# Patient Record
Sex: Female | Born: 1960 | Race: White | Hispanic: No | Smoking: Current every day smoker
Health system: Southern US, Community
[De-identification: ages and names within clinical notes are randomized; demographics above are authoritative.]

## PROBLEM LIST (undated history)

## (undated) DIAGNOSIS — M1990 Unspecified osteoarthritis, unspecified site: Secondary | ICD-10-CM

## (undated) DIAGNOSIS — G709 Myoneural disorder, unspecified: Secondary | ICD-10-CM

## (undated) DIAGNOSIS — E039 Hypothyroidism, unspecified: Secondary | ICD-10-CM

## (undated) DIAGNOSIS — Z9689 Presence of other specified functional implants: Secondary | ICD-10-CM

## (undated) DIAGNOSIS — K7460 Unspecified cirrhosis of liver: Secondary | ICD-10-CM

## (undated) DIAGNOSIS — F419 Anxiety disorder, unspecified: Secondary | ICD-10-CM

## (undated) DIAGNOSIS — K5900 Constipation, unspecified: Secondary | ICD-10-CM

## (undated) DIAGNOSIS — F1010 Alcohol abuse, uncomplicated: Secondary | ICD-10-CM

## (undated) DIAGNOSIS — Z720 Tobacco use: Secondary | ICD-10-CM

## (undated) DIAGNOSIS — E119 Type 2 diabetes mellitus without complications: Secondary | ICD-10-CM

## (undated) DIAGNOSIS — E785 Hyperlipidemia, unspecified: Secondary | ICD-10-CM

## (undated) DIAGNOSIS — T8859XA Other complications of anesthesia, initial encounter: Secondary | ICD-10-CM

## (undated) DIAGNOSIS — M4800 Spinal stenosis, site unspecified: Secondary | ICD-10-CM

## (undated) DIAGNOSIS — H8110 Benign paroxysmal vertigo, unspecified ear: Secondary | ICD-10-CM

## (undated) DIAGNOSIS — J449 Chronic obstructive pulmonary disease, unspecified: Secondary | ICD-10-CM

## (undated) DIAGNOSIS — E538 Deficiency of other specified B group vitamins: Secondary | ICD-10-CM

## (undated) DIAGNOSIS — K219 Gastro-esophageal reflux disease without esophagitis: Secondary | ICD-10-CM

## (undated) DIAGNOSIS — T7840XA Allergy, unspecified, initial encounter: Secondary | ICD-10-CM

## (undated) DIAGNOSIS — D649 Anemia, unspecified: Secondary | ICD-10-CM

## (undated) DIAGNOSIS — M21379 Foot drop, unspecified foot: Secondary | ICD-10-CM

## (undated) DIAGNOSIS — T4145XA Adverse effect of unspecified anesthetic, initial encounter: Secondary | ICD-10-CM

## (undated) DIAGNOSIS — Z950 Presence of cardiac pacemaker: Secondary | ICD-10-CM

## (undated) DIAGNOSIS — I509 Heart failure, unspecified: Secondary | ICD-10-CM

## (undated) HISTORY — PX: HERNIA REPAIR: SHX51

## (undated) HISTORY — DX: Anxiety disorder, unspecified: F41.9

## (undated) HISTORY — DX: Type 2 diabetes mellitus without complications: E11.9

## (undated) HISTORY — PX: ANKLE SURGERY: SHX546

## (undated) HISTORY — DX: Myoneural disorder, unspecified: G70.9

## (undated) HISTORY — PX: KNEE ARTHROSCOPY: SUR90

## (undated) HISTORY — DX: Constipation, unspecified: K59.00

## (undated) HISTORY — PX: COLONOSCOPY: SHX174

## (undated) HISTORY — DX: Hyperlipidemia, unspecified: E78.5

## (undated) HISTORY — DX: Unspecified osteoarthritis, unspecified site: M19.90

## (undated) HISTORY — PX: OTHER SURGICAL HISTORY: SHX169

## (undated) HISTORY — DX: Allergy, unspecified, initial encounter: T78.40XA

## (undated) HISTORY — PX: CERVICAL CONE BIOPSY: SUR198

## (undated) HISTORY — DX: Heart failure, unspecified: I50.9

## (undated) HISTORY — PX: KNEE SURGERY: SHX244

## (undated) HISTORY — PX: POLYPECTOMY: SHX149

## (undated) HISTORY — DX: Anemia, unspecified: D64.9

## (undated) HISTORY — PX: UMBILICAL HERNIA REPAIR: SHX196

## (undated) HISTORY — PX: KNEE ARTHROSCOPY: SHX127

---

## 2005-01-07 ENCOUNTER — Ambulatory Visit: Payer: Self-pay | Admitting: Internal Medicine

## 2005-01-14 ENCOUNTER — Ambulatory Visit: Payer: Self-pay | Admitting: Internal Medicine

## 2005-01-14 ENCOUNTER — Other Ambulatory Visit: Admission: RE | Admit: 2005-01-14 | Discharge: 2005-01-14 | Payer: Self-pay | Admitting: Internal Medicine

## 2005-01-15 ENCOUNTER — Ambulatory Visit: Payer: Self-pay | Admitting: Internal Medicine

## 2005-06-24 ENCOUNTER — Emergency Department (HOSPITAL_COMMUNITY): Admission: EM | Admit: 2005-06-24 | Discharge: 2005-06-24 | Payer: Self-pay | Admitting: Family Medicine

## 2005-06-28 ENCOUNTER — Emergency Department (HOSPITAL_COMMUNITY): Admission: EM | Admit: 2005-06-28 | Discharge: 2005-06-28 | Payer: Self-pay | Admitting: Emergency Medicine

## 2006-07-25 ENCOUNTER — Emergency Department (HOSPITAL_COMMUNITY): Admission: EM | Admit: 2006-07-25 | Discharge: 2006-07-25 | Payer: Self-pay | Admitting: Emergency Medicine

## 2006-08-15 ENCOUNTER — Encounter: Admission: RE | Admit: 2006-08-15 | Discharge: 2006-08-15 | Payer: Self-pay | Admitting: Orthopedic Surgery

## 2006-08-20 ENCOUNTER — Other Ambulatory Visit: Admission: RE | Admit: 2006-08-20 | Discharge: 2006-08-20 | Payer: Self-pay | Admitting: Obstetrics & Gynecology

## 2006-09-04 ENCOUNTER — Encounter: Admission: RE | Admit: 2006-09-04 | Discharge: 2006-09-04 | Payer: Self-pay | Admitting: Gastroenterology

## 2006-09-10 ENCOUNTER — Ambulatory Visit: Payer: Self-pay | Admitting: Physical Medicine and Rehabilitation

## 2006-09-10 ENCOUNTER — Encounter
Admission: RE | Admit: 2006-09-10 | Discharge: 2006-12-09 | Payer: Self-pay | Admitting: Physical Medicine and Rehabilitation

## 2006-09-18 ENCOUNTER — Encounter: Admission: RE | Admit: 2006-09-18 | Discharge: 2006-09-18 | Payer: Self-pay | Admitting: Obstetrics & Gynecology

## 2006-10-14 ENCOUNTER — Other Ambulatory Visit: Admission: RE | Admit: 2006-10-14 | Discharge: 2006-10-14 | Payer: Self-pay | Admitting: Obstetrics & Gynecology

## 2006-11-03 ENCOUNTER — Encounter
Admission: RE | Admit: 2006-11-03 | Discharge: 2007-02-01 | Payer: Self-pay | Admitting: Physical Medicine and Rehabilitation

## 2006-11-03 ENCOUNTER — Ambulatory Visit: Payer: Self-pay | Admitting: Physical Medicine and Rehabilitation

## 2006-11-09 ENCOUNTER — Ambulatory Visit (HOSPITAL_BASED_OUTPATIENT_CLINIC_OR_DEPARTMENT_OTHER): Admission: RE | Admit: 2006-11-09 | Discharge: 2006-11-09 | Payer: Self-pay | Admitting: Obstetrics & Gynecology

## 2006-11-09 ENCOUNTER — Encounter (INDEPENDENT_AMBULATORY_CARE_PROVIDER_SITE_OTHER): Payer: Self-pay | Admitting: *Deleted

## 2007-01-12 ENCOUNTER — Ambulatory Visit (HOSPITAL_BASED_OUTPATIENT_CLINIC_OR_DEPARTMENT_OTHER): Admission: RE | Admit: 2007-01-12 | Discharge: 2007-01-12 | Payer: Self-pay | Admitting: Orthopedic Surgery

## 2007-05-31 DIAGNOSIS — M1990 Unspecified osteoarthritis, unspecified site: Secondary | ICD-10-CM | POA: Insufficient documentation

## 2007-05-31 DIAGNOSIS — J309 Allergic rhinitis, unspecified: Secondary | ICD-10-CM | POA: Insufficient documentation

## 2007-05-31 DIAGNOSIS — K219 Gastro-esophageal reflux disease without esophagitis: Secondary | ICD-10-CM | POA: Insufficient documentation

## 2008-01-26 ENCOUNTER — Other Ambulatory Visit: Admission: RE | Admit: 2008-01-26 | Discharge: 2008-01-26 | Payer: Self-pay | Admitting: Obstetrics & Gynecology

## 2008-01-27 ENCOUNTER — Encounter: Admission: RE | Admit: 2008-01-27 | Discharge: 2008-01-27 | Payer: Self-pay | Admitting: Obstetrics & Gynecology

## 2008-02-01 ENCOUNTER — Encounter: Admission: RE | Admit: 2008-02-01 | Discharge: 2008-02-01 | Payer: Self-pay | Admitting: Obstetrics & Gynecology

## 2008-02-01 ENCOUNTER — Encounter (INDEPENDENT_AMBULATORY_CARE_PROVIDER_SITE_OTHER): Payer: Self-pay | Admitting: *Deleted

## 2008-02-10 ENCOUNTER — Telehealth (INDEPENDENT_AMBULATORY_CARE_PROVIDER_SITE_OTHER): Payer: Self-pay | Admitting: *Deleted

## 2008-03-14 DIAGNOSIS — K644 Residual hemorrhoidal skin tags: Secondary | ICD-10-CM | POA: Insufficient documentation

## 2008-03-14 DIAGNOSIS — A389 Scarlet fever, uncomplicated: Secondary | ICD-10-CM | POA: Insufficient documentation

## 2008-03-14 DIAGNOSIS — N946 Dysmenorrhea, unspecified: Secondary | ICD-10-CM | POA: Insufficient documentation

## 2008-03-14 DIAGNOSIS — Z8639 Personal history of other endocrine, nutritional and metabolic disease: Secondary | ICD-10-CM

## 2008-03-14 DIAGNOSIS — K429 Umbilical hernia without obstruction or gangrene: Secondary | ICD-10-CM | POA: Insufficient documentation

## 2008-03-14 DIAGNOSIS — E785 Hyperlipidemia, unspecified: Secondary | ICD-10-CM | POA: Insufficient documentation

## 2008-03-14 DIAGNOSIS — G4700 Insomnia, unspecified: Secondary | ICD-10-CM | POA: Insufficient documentation

## 2008-03-14 DIAGNOSIS — R161 Splenomegaly, not elsewhere classified: Secondary | ICD-10-CM | POA: Insufficient documentation

## 2008-03-14 DIAGNOSIS — R160 Hepatomegaly, not elsewhere classified: Secondary | ICD-10-CM | POA: Insufficient documentation

## 2008-03-14 DIAGNOSIS — N809 Endometriosis, unspecified: Secondary | ICD-10-CM | POA: Insufficient documentation

## 2008-03-14 DIAGNOSIS — Z862 Personal history of diseases of the blood and blood-forming organs and certain disorders involving the immune mechanism: Secondary | ICD-10-CM | POA: Insufficient documentation

## 2008-03-14 DIAGNOSIS — Z872 Personal history of diseases of the skin and subcutaneous tissue: Secondary | ICD-10-CM | POA: Insufficient documentation

## 2008-03-16 ENCOUNTER — Ambulatory Visit: Payer: Self-pay | Admitting: Internal Medicine

## 2008-03-16 DIAGNOSIS — F1010 Alcohol abuse, uncomplicated: Secondary | ICD-10-CM | POA: Insufficient documentation

## 2008-03-16 DIAGNOSIS — R740 Nonspecific elevation of levels of transaminase and lactic acid dehydrogenase [LDH]: Secondary | ICD-10-CM

## 2008-03-16 DIAGNOSIS — R7401 Elevation of levels of liver transaminase levels: Secondary | ICD-10-CM | POA: Insufficient documentation

## 2008-03-16 DIAGNOSIS — R7402 Elevation of levels of lactic acid dehydrogenase (LDH): Secondary | ICD-10-CM | POA: Insufficient documentation

## 2008-03-20 ENCOUNTER — Telehealth: Payer: Self-pay | Admitting: Internal Medicine

## 2008-03-28 ENCOUNTER — Ambulatory Visit: Payer: Self-pay | Admitting: Internal Medicine

## 2008-04-04 ENCOUNTER — Ambulatory Visit: Payer: Self-pay | Admitting: Internal Medicine

## 2008-04-04 DIAGNOSIS — I8500 Esophageal varices without bleeding: Secondary | ICD-10-CM | POA: Insufficient documentation

## 2008-04-05 LAB — CONVERTED CEMR LAB
ALT: 13 U/L
AST: 60 U/L — ABNORMAL HIGH
Albumin: 2.7 g/dL — ABNORMAL LOW
Alkaline Phosphatase: 179 U/L — ABNORMAL HIGH
Bilirubin, Direct: 0.4 mg/dL — ABNORMAL HIGH
Cholesterol: 169 mg/dL
HDL: 16.1 mg/dL — ABNORMAL LOW
INR: 1.3 — ABNORMAL HIGH
LDL Cholesterol: 128 mg/dL — ABNORMAL HIGH
Prothrombin Time: 15.5 s — ABNORMAL HIGH
Total Bilirubin: 1.4 mg/dL — ABNORMAL HIGH
Total CHOL/HDL Ratio: 10.5
Total Protein: 7 g/dL
Triglycerides: 126 mg/dL
VLDL: 25 mg/dL

## 2008-05-29 ENCOUNTER — Ambulatory Visit: Payer: Self-pay | Admitting: Internal Medicine

## 2008-05-29 LAB — CONVERTED CEMR LAB
Albumin: 3.1 g/dL — ABNORMAL LOW (ref 3.5–5.2)
Alkaline Phosphatase: 168 units/L — ABNORMAL HIGH (ref 39–117)
INR: 1.3 — ABNORMAL HIGH (ref 0.8–1.0)
Prothrombin Time: 14.8 s — ABNORMAL HIGH (ref 10.9–13.3)
aPTT: 43.2 s — ABNORMAL HIGH (ref 21.7–29.8)

## 2008-05-31 ENCOUNTER — Telehealth: Payer: Self-pay | Admitting: Internal Medicine

## 2008-06-14 ENCOUNTER — Telehealth: Payer: Self-pay | Admitting: Internal Medicine

## 2008-09-20 ENCOUNTER — Encounter: Payer: Self-pay | Admitting: Internal Medicine

## 2008-09-25 ENCOUNTER — Telehealth: Payer: Self-pay | Admitting: Internal Medicine

## 2008-09-28 ENCOUNTER — Encounter (INDEPENDENT_AMBULATORY_CARE_PROVIDER_SITE_OTHER): Payer: Self-pay | Admitting: *Deleted

## 2008-09-28 ENCOUNTER — Encounter: Admission: RE | Admit: 2008-09-28 | Discharge: 2008-09-28 | Payer: Self-pay | Admitting: Family Medicine

## 2008-10-02 ENCOUNTER — Ambulatory Visit: Payer: Self-pay | Admitting: Internal Medicine

## 2008-10-02 DIAGNOSIS — K7030 Alcoholic cirrhosis of liver without ascites: Secondary | ICD-10-CM | POA: Insufficient documentation

## 2008-10-02 DIAGNOSIS — R1084 Generalized abdominal pain: Secondary | ICD-10-CM | POA: Insufficient documentation

## 2008-10-02 DIAGNOSIS — R17 Unspecified jaundice: Secondary | ICD-10-CM | POA: Insufficient documentation

## 2008-10-02 DIAGNOSIS — R188 Other ascites: Secondary | ICD-10-CM | POA: Insufficient documentation

## 2008-10-02 LAB — CONVERTED CEMR LAB
Ammonia: 31 umol/L (ref 11–35)
Eosinophils Relative: 0 % (ref 0.0–5.0)
HCT: 29.9 % — ABNORMAL LOW (ref 36.0–46.0)
INR: 1.5 — ABNORMAL HIGH (ref 0.8–1.0)
Monocytes Relative: 6 % (ref 3.0–12.0)
Neutrophils Relative %: 82 % — ABNORMAL HIGH (ref 43.0–77.0)
Platelets: 242 10*3/uL (ref 150–400)
WBC: 11.7 10*3/uL — ABNORMAL HIGH (ref 4.5–10.5)

## 2008-10-03 ENCOUNTER — Ambulatory Visit: Payer: Self-pay | Admitting: Internal Medicine

## 2008-10-04 ENCOUNTER — Observation Stay (HOSPITAL_COMMUNITY): Admission: AD | Admit: 2008-10-04 | Discharge: 2008-10-05 | Payer: Self-pay | Admitting: Internal Medicine

## 2008-10-04 ENCOUNTER — Telehealth: Payer: Self-pay | Admitting: Internal Medicine

## 2008-10-04 ENCOUNTER — Encounter (INDEPENDENT_AMBULATORY_CARE_PROVIDER_SITE_OTHER): Payer: Self-pay | Admitting: Interventional Radiology

## 2008-10-04 LAB — CONVERTED CEMR LAB
ALT: 16 units/L (ref 0–35)
CO2: 28 meq/L (ref 19–32)
Calcium: 8.6 mg/dL (ref 8.4–10.5)
Chloride: 86 meq/L — ABNORMAL LOW (ref 96–112)
GFR calc Af Amer: 171 mL/min
Sodium: 122 meq/L — ABNORMAL LOW (ref 135–145)
Total Protein: 7.3 g/dL (ref 6.0–8.3)

## 2008-10-05 ENCOUNTER — Encounter (INDEPENDENT_AMBULATORY_CARE_PROVIDER_SITE_OTHER): Payer: Self-pay | Admitting: *Deleted

## 2008-10-05 ENCOUNTER — Ambulatory Visit: Payer: Self-pay | Admitting: Gastroenterology

## 2008-10-09 ENCOUNTER — Ambulatory Visit: Payer: Self-pay | Admitting: Internal Medicine

## 2008-10-09 LAB — CONVERTED CEMR LAB
AST: 71 units/L — ABNORMAL HIGH (ref 0–37)
Alkaline Phosphatase: 114 units/L (ref 39–117)
Ammonia: 27 umol/L (ref 11–35)
BUN: 7 mg/dL (ref 6–23)
Calcium: 8.6 mg/dL (ref 8.4–10.5)
Chloride: 102 meq/L (ref 96–112)
Creatinine, Ser: 0.5 mg/dL (ref 0.4–1.2)
GFR calc non Af Amer: 141 mL/min

## 2008-10-23 ENCOUNTER — Ambulatory Visit: Payer: Self-pay | Admitting: Internal Medicine

## 2008-10-23 LAB — CONVERTED CEMR LAB
ALT: 13 units/L (ref 0–35)
AST: 39 units/L — ABNORMAL HIGH (ref 0–37)
CO2: 26 meq/L (ref 19–32)
GFR calc Af Amer: 170 mL/min
Sodium: 134 meq/L — ABNORMAL LOW (ref 135–145)
Total Bilirubin: 3.3 mg/dL — ABNORMAL HIGH (ref 0.3–1.2)
Total Protein: 7.6 g/dL (ref 6.0–8.3)

## 2008-10-27 ENCOUNTER — Ambulatory Visit: Payer: Self-pay | Admitting: Cardiology

## 2008-11-21 ENCOUNTER — Encounter: Payer: Self-pay | Admitting: Internal Medicine

## 2008-11-24 ENCOUNTER — Ambulatory Visit: Payer: Self-pay | Admitting: Internal Medicine

## 2008-11-24 LAB — CONVERTED CEMR LAB
ALT: 11 units/L (ref 0–35)
AST: 32 units/L (ref 0–37)
Alkaline Phosphatase: 84 units/L (ref 39–117)
Bilirubin, Direct: 0.6 mg/dL — ABNORMAL HIGH (ref 0.0–0.3)
Total Protein: 8 g/dL (ref 6.0–8.3)

## 2008-11-27 ENCOUNTER — Ambulatory Visit (HOSPITAL_COMMUNITY): Admission: RE | Admit: 2008-11-27 | Discharge: 2008-11-27 | Payer: Self-pay | Admitting: Internal Medicine

## 2008-12-12 ENCOUNTER — Telehealth: Payer: Self-pay | Admitting: Internal Medicine

## 2008-12-14 ENCOUNTER — Inpatient Hospital Stay (HOSPITAL_COMMUNITY): Admission: AD | Admit: 2008-12-14 | Discharge: 2008-12-16 | Payer: Self-pay | Admitting: Obstetrics & Gynecology

## 2008-12-16 ENCOUNTER — Encounter (INDEPENDENT_AMBULATORY_CARE_PROVIDER_SITE_OTHER): Payer: Self-pay | Admitting: *Deleted

## 2009-01-03 ENCOUNTER — Ambulatory Visit: Payer: Self-pay | Admitting: Internal Medicine

## 2009-01-03 ENCOUNTER — Encounter: Payer: Self-pay | Admitting: Internal Medicine

## 2009-01-06 ENCOUNTER — Encounter: Payer: Self-pay | Admitting: Internal Medicine

## 2009-01-22 ENCOUNTER — Ambulatory Visit: Payer: Self-pay | Admitting: Internal Medicine

## 2009-01-22 LAB — CONVERTED CEMR LAB
ALT: 11 units/L (ref 0–35)
Alkaline Phosphatase: 88 units/L (ref 39–117)
Ammonia: 12 umol/L (ref 11–35)
Creatinine, Ser: 0.5 mg/dL (ref 0.4–1.2)
Eosinophils Absolute: 0.2 10*3/uL (ref 0.0–0.7)
Lymphs Abs: 2.2 10*3/uL (ref 0.7–4.0)
MCHC: 34.9 g/dL (ref 30.0–36.0)
MCV: 84.3 fL (ref 78.0–100.0)
Monocytes Absolute: 0.4 10*3/uL (ref 0.1–1.0)
Neutrophils Relative %: 53.4 % (ref 43.0–77.0)
Platelets: 178 10*3/uL (ref 150.0–400.0)
RDW: 16.6 % — ABNORMAL HIGH (ref 11.5–14.6)
Sodium: 138 meq/L (ref 135–145)
Total Bilirubin: 1.5 mg/dL — ABNORMAL HIGH (ref 0.3–1.2)
Total Protein: 7.3 g/dL (ref 6.0–8.3)
WBC: 6 10*3/uL (ref 4.5–10.5)

## 2009-02-01 ENCOUNTER — Encounter: Admission: RE | Admit: 2009-02-01 | Discharge: 2009-02-01 | Payer: Self-pay | Admitting: Obstetrics & Gynecology

## 2009-02-06 ENCOUNTER — Encounter: Admission: RE | Admit: 2009-02-06 | Discharge: 2009-02-06 | Payer: Self-pay | Admitting: Obstetrics & Gynecology

## 2009-02-12 ENCOUNTER — Encounter: Payer: Self-pay | Admitting: Internal Medicine

## 2009-02-14 ENCOUNTER — Encounter: Payer: Self-pay | Admitting: Internal Medicine

## 2009-03-01 ENCOUNTER — Telehealth: Payer: Self-pay | Admitting: Internal Medicine

## 2009-03-01 ENCOUNTER — Encounter: Payer: Self-pay | Admitting: Internal Medicine

## 2009-03-02 ENCOUNTER — Telehealth: Payer: Self-pay | Admitting: Internal Medicine

## 2009-03-26 ENCOUNTER — Ambulatory Visit: Payer: Self-pay | Admitting: Internal Medicine

## 2009-03-27 LAB — CONVERTED CEMR LAB
AST: 36 units/L (ref 0–37)
Albumin: 3.2 g/dL — ABNORMAL LOW (ref 3.5–5.2)
Alkaline Phosphatase: 86 units/L (ref 39–117)
BUN: 7 mg/dL (ref 6–23)
Cholesterol: 169 mg/dL (ref 0–200)
Glucose, Bld: 105 mg/dL — ABNORMAL HIGH (ref 70–99)
HDL: 42.9 mg/dL (ref >39.00)
INR: 1.3 — ABNORMAL HIGH (ref 0.8–1.0)
LDL Cholesterol: 115 mg/dL — ABNORMAL HIGH (ref 0–99)
Potassium: 4.4 meq/L (ref 3.5–5.1)
Prothrombin Time: 13.6 s — ABNORMAL HIGH (ref 10.9–13.3)
Sodium: 138 meq/L (ref 135–145)
Total Bilirubin: 1.1 mg/dL (ref 0.3–1.2)
VLDL: 10.8 mg/dL (ref 0.0–40.0)

## 2009-04-06 ENCOUNTER — Encounter: Payer: Self-pay | Admitting: Internal Medicine

## 2009-04-11 ENCOUNTER — Telehealth: Payer: Self-pay | Admitting: Internal Medicine

## 2009-07-05 ENCOUNTER — Telehealth: Payer: Self-pay | Admitting: Internal Medicine

## 2009-07-13 ENCOUNTER — Telehealth: Payer: Self-pay | Admitting: Internal Medicine

## 2009-07-18 ENCOUNTER — Ambulatory Visit: Payer: Self-pay | Admitting: Internal Medicine

## 2009-07-20 ENCOUNTER — Telehealth: Payer: Self-pay | Admitting: Internal Medicine

## 2009-07-20 LAB — CONVERTED CEMR LAB
ALT: 16 units/L (ref 0–35)
AST: 38 units/L — ABNORMAL HIGH (ref 0–37)
Alkaline Phosphatase: 82 units/L (ref 39–117)
Ammonia: 46 umol/L — ABNORMAL HIGH (ref 11–35)
Basophils Absolute: 0 10*3/uL (ref 0.0–0.1)
Basophils Relative: 0.2 % (ref 0.0–3.0)
Bilirubin, Direct: 0.3 mg/dL (ref 0.0–0.3)
CO2: 26 meq/L (ref 19–32)
Chloride: 98 meq/L (ref 96–112)
Creatinine, Ser: 0.7 mg/dL (ref 0.4–1.2)
Eosinophils Absolute: 0.1 10*3/uL (ref 0.0–0.7)
INR: 1.3 — ABNORMAL HIGH (ref 0.8–1.0)
Lymphocytes Relative: 34 % (ref 12.0–46.0)
MCHC: 33 g/dL (ref 30.0–36.0)
MCV: 83.7 fL (ref 78.0–100.0)
Monocytes Absolute: 0.6 10*3/uL (ref 0.1–1.0)
Neutrophils Relative %: 56 % (ref 43.0–77.0)
Platelets: 175 10*3/uL (ref 150.0–400.0)
Potassium: 4.5 meq/L (ref 3.5–5.1)
Prothrombin Time: 13.1 s — ABNORMAL HIGH (ref 9.1–11.7)
RDW: 17 % — ABNORMAL HIGH (ref 11.5–14.6)
Total Bilirubin: 1.3 mg/dL — ABNORMAL HIGH (ref 0.3–1.2)
Total Protein: 8.1 g/dL (ref 6.0–8.3)

## 2009-07-27 ENCOUNTER — Ambulatory Visit: Payer: Self-pay | Admitting: Internal Medicine

## 2009-07-30 LAB — CONVERTED CEMR LAB
Alkaline Phosphatase: 87 units/L (ref 39–117)
Total Protein: 8.4 g/dL — ABNORMAL HIGH (ref 6.0–8.3)

## 2009-07-31 LAB — CONVERTED CEMR LAB
CO2: 26 meq/L (ref 19–32)
Creatinine, Ser: 0.6 mg/dL (ref 0.4–1.2)
GFR calc non Af Amer: 113.51 mL/min (ref >60)
Glucose, Bld: 77 mg/dL (ref 70–99)
Potassium: 4.7 meq/L (ref 3.5–5.1)
Sodium: 142 meq/L (ref 135–145)

## 2009-08-03 ENCOUNTER — Telehealth: Payer: Self-pay | Admitting: Internal Medicine

## 2009-08-21 ENCOUNTER — Ambulatory Visit: Payer: Self-pay | Admitting: Internal Medicine

## 2009-08-22 ENCOUNTER — Telehealth: Payer: Self-pay | Admitting: Internal Medicine

## 2009-08-22 LAB — CONVERTED CEMR LAB
CO2: 25 meq/L (ref 19–32)
Creatinine, Ser: 0.6 mg/dL (ref 0.4–1.2)
GFR calc non Af Amer: 113.48 mL/min (ref >60)
Glucose, Bld: 111 mg/dL — ABNORMAL HIGH (ref 70–99)
Potassium: 3.5 meq/L (ref 3.5–5.1)
Sodium: 138 meq/L (ref 135–145)

## 2009-09-13 ENCOUNTER — Ambulatory Visit: Payer: Self-pay | Admitting: Internal Medicine

## 2009-09-13 LAB — CONVERTED CEMR LAB: IgA: 538 mg/dL — ABNORMAL HIGH (ref 68–378)

## 2009-09-14 LAB — CONVERTED CEMR LAB
Creatinine, Ser: 0.6 mg/dL (ref 0.4–1.2)
GFR calc non Af Amer: 113.45 mL/min (ref >60)
Glucose, Bld: 87 mg/dL (ref 70–99)
Potassium: 3.9 meq/L (ref 3.5–5.1)
Sodium: 136 meq/L (ref 135–145)
Vitamin B-12: >1500 pg/mL — ABNORMAL HIGH (ref 211–911)

## 2009-09-17 ENCOUNTER — Ambulatory Visit: Payer: Self-pay | Admitting: Internal Medicine

## 2009-09-17 DIAGNOSIS — G64 Other disorders of peripheral nervous system: Secondary | ICD-10-CM | POA: Insufficient documentation

## 2009-09-17 DIAGNOSIS — G609 Hereditary and idiopathic neuropathy, unspecified: Secondary | ICD-10-CM | POA: Insufficient documentation

## 2009-09-17 LAB — CONVERTED CEMR LAB
AST: 35 units/L (ref 0–37)
Albumin: 3.9 g/dL (ref 3.5–5.2)
Ammonia: 26 umol/L (ref 11–35)
Basophils Absolute: 0.1 10*3/uL (ref 0.0–0.1)
CO2: 28 meq/L (ref 19–32)
Chloride: 103 meq/L (ref 96–112)
Eosinophils Absolute: 0.1 10*3/uL (ref 0.0–0.7)
Glucose, Bld: 106 mg/dL — ABNORMAL HIGH (ref 70–99)
HCT: 36.8 % (ref 36.0–46.0)
Hemoglobin: 12.2 g/dL (ref 12.0–15.0)
Lymphs Abs: 2.1 10*3/uL (ref 0.7–4.0)
MCHC: 33 g/dL (ref 30.0–36.0)
MCV: 83.9 fL (ref 78.0–100.0)
Monocytes Absolute: 0.5 10*3/uL (ref 0.1–1.0)
Neutro Abs: 3.6 10*3/uL (ref 1.4–7.7)
RDW: 16.1 % — ABNORMAL HIGH (ref 11.5–14.6)
Sodium: 137 meq/L (ref 135–145)

## 2009-10-08 ENCOUNTER — Telehealth: Payer: Self-pay | Admitting: Internal Medicine

## 2009-10-08 ENCOUNTER — Encounter: Payer: Self-pay | Admitting: Physician Assistant

## 2009-10-08 ENCOUNTER — Telehealth (INDEPENDENT_AMBULATORY_CARE_PROVIDER_SITE_OTHER): Payer: Self-pay

## 2009-11-09 ENCOUNTER — Encounter: Payer: Self-pay | Admitting: Internal Medicine

## 2009-11-12 ENCOUNTER — Encounter: Payer: Self-pay | Admitting: Internal Medicine

## 2009-11-14 ENCOUNTER — Telehealth: Payer: Self-pay | Admitting: Internal Medicine

## 2009-11-19 ENCOUNTER — Ambulatory Visit: Payer: Self-pay | Admitting: Internal Medicine

## 2009-11-19 LAB — CONVERTED CEMR LAB
Ammonia: 27 umol/L (ref 11–35)
BUN: 20 mg/dL (ref 6–23)
Chloride: 96 meq/L (ref 96–112)
GFR calc non Af Amer: 94.89 mL/min (ref >60)
Phosphorus: 2.9 mg/dL (ref 2.3–4.6)
Potassium: 3.8 meq/L (ref 3.5–5.1)

## 2009-11-28 ENCOUNTER — Ambulatory Visit: Payer: Self-pay | Admitting: Internal Medicine

## 2009-11-28 LAB — CONVERTED CEMR LAB
Albumin: 3.9 g/dL (ref 3.5–5.2)
Creatinine, Ser: 0.6 mg/dL (ref 0.4–1.2)
Glucose, Bld: 94 mg/dL (ref 70–99)
Phosphorus: 3.1 mg/dL (ref 2.3–4.6)
Potassium: 3.6 meq/L (ref 3.5–5.1)

## 2009-12-04 ENCOUNTER — Ambulatory Visit: Payer: Self-pay | Admitting: Internal Medicine

## 2009-12-04 LAB — CONVERTED CEMR LAB
Albumin: 3.9 g/dL (ref 3.5–5.2)
BUN: 13 mg/dL (ref 6–23)
Creatinine, Ser: 0.7 mg/dL (ref 0.4–1.2)
Glucose, Bld: 114 mg/dL — ABNORMAL HIGH (ref 70–99)
Phosphorus: 3.3 mg/dL (ref 2.3–4.6)
Potassium: 3.6 meq/L (ref 3.5–5.1)

## 2009-12-10 ENCOUNTER — Telehealth: Payer: Self-pay | Admitting: Internal Medicine

## 2009-12-11 ENCOUNTER — Ambulatory Visit: Payer: Self-pay | Admitting: Internal Medicine

## 2009-12-14 ENCOUNTER — Telehealth: Payer: Self-pay | Admitting: Internal Medicine

## 2009-12-14 LAB — CONVERTED CEMR LAB
Albumin: 4.1 g/dL (ref 3.5–5.2)
Ammonia: 25 umol/L (ref 11–35)
BUN: 23 mg/dL (ref 6–23)
CO2: 32 meq/L (ref 19–32)
Calcium: 10 mg/dL (ref 8.4–10.5)
Creatinine, Ser: 0.7 mg/dL (ref 0.4–1.2)

## 2009-12-17 ENCOUNTER — Ambulatory Visit: Payer: Self-pay | Admitting: Internal Medicine

## 2009-12-18 ENCOUNTER — Ambulatory Visit (HOSPITAL_COMMUNITY): Admission: RE | Admit: 2009-12-18 | Discharge: 2009-12-18 | Payer: Self-pay | Admitting: Internal Medicine

## 2009-12-25 ENCOUNTER — Ambulatory Visit: Payer: Self-pay | Admitting: Internal Medicine

## 2009-12-25 LAB — CONVERTED CEMR LAB
Albumin: 3.9 g/dL (ref 3.5–5.2)
Ammonia: 17 umol/L (ref 11–35)
BUN: 19 mg/dL (ref 6–23)
Basophils Absolute: 0 10*3/uL (ref 0.0–0.1)
CO2: 29 meq/L (ref 19–32)
Creatinine, Ser: 0.7 mg/dL (ref 0.4–1.2)
GFR calc non Af Amer: 94.85 mL/min (ref >60)
HCT: 41.1 % (ref 36.0–46.0)
Hemoglobin: 13.5 g/dL (ref 12.0–15.0)
Lymphs Abs: 2.2 10*3/uL (ref 0.7–4.0)
MCV: 87.6 fL (ref 78.0–100.0)
Monocytes Absolute: 0.4 10*3/uL (ref 0.1–1.0)
Neutro Abs: 6.8 10*3/uL (ref 1.4–7.7)
Phosphorus: 3.7 mg/dL (ref 2.3–4.6)
Platelets: 204 10*3/uL (ref 150.0–400.0)
RDW: 15.7 % — ABNORMAL HIGH (ref 11.5–14.6)

## 2009-12-31 ENCOUNTER — Encounter: Payer: Self-pay | Admitting: Internal Medicine

## 2009-12-31 IMAGING — US US ABDOMEN COMPLETE
1 series · 14 of 25 positions shown · non-contrast
Comparison: 09/04/2006

CLINICAL DATA: Abdominal pain.  Elevated liver function tests.

ABDOMEN ULTRASOUND
TECHNIQUE: Complete abdominal ultrasound examination was performed
including evaluation of the liver, gallbladder, bile ducts,
pancreas, kidneys, spleen, IVC, and abdominal aorta.

[Series 1: us abdomen complete · 0.34mm/px · 14 of 85 slices shown]
[im 1/85]
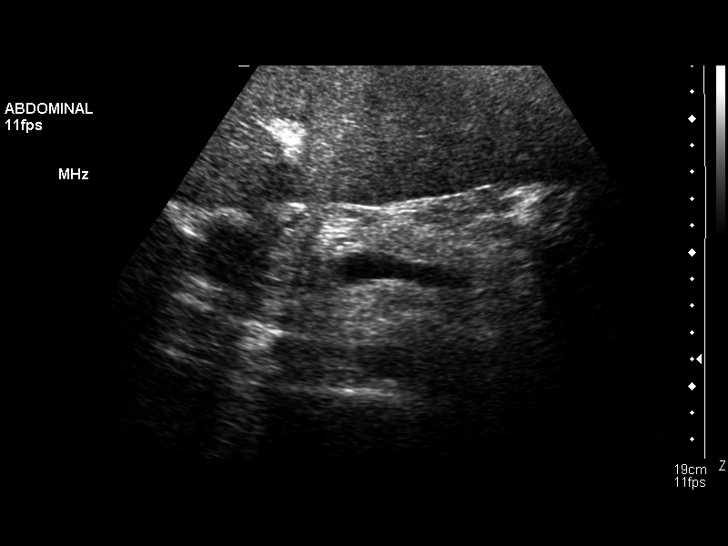
[im 8/85]
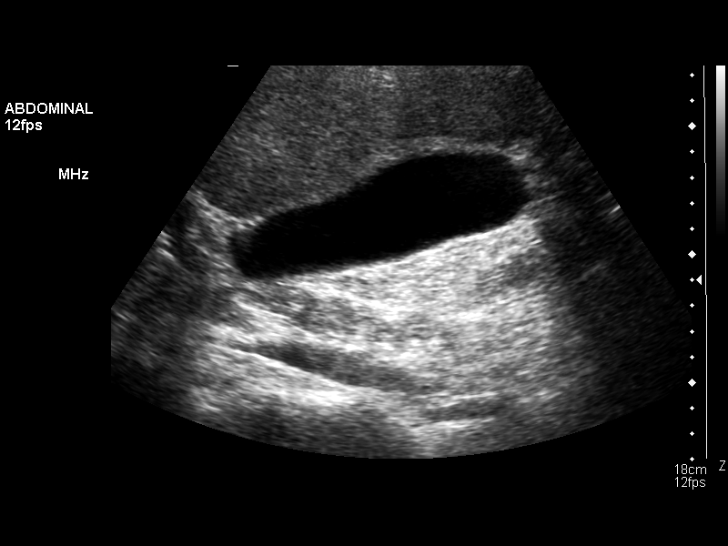
[im 15/85]
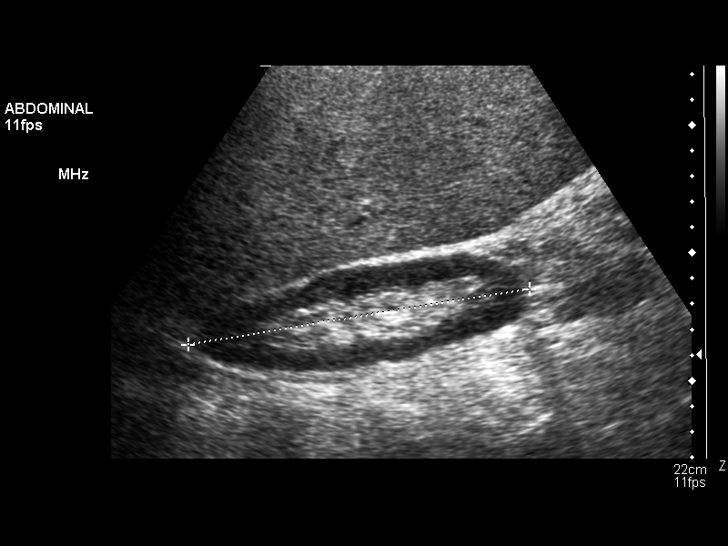
[im 22/85]
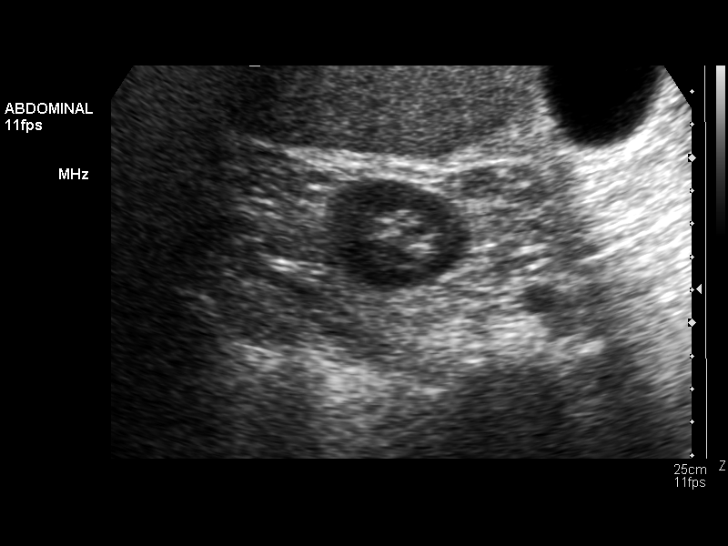
[im 29/85]
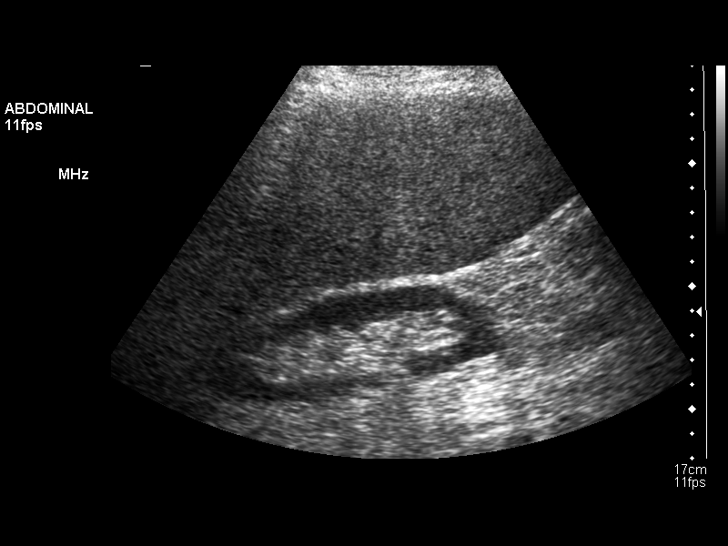
[im 32/85]
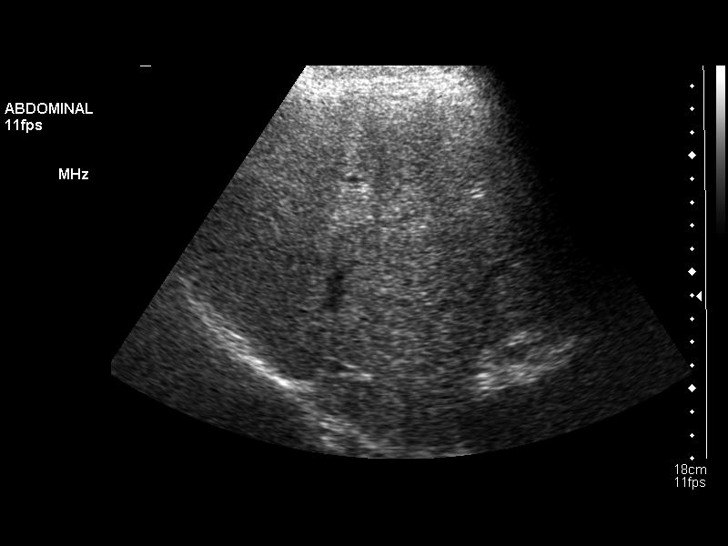
[im 39/85]
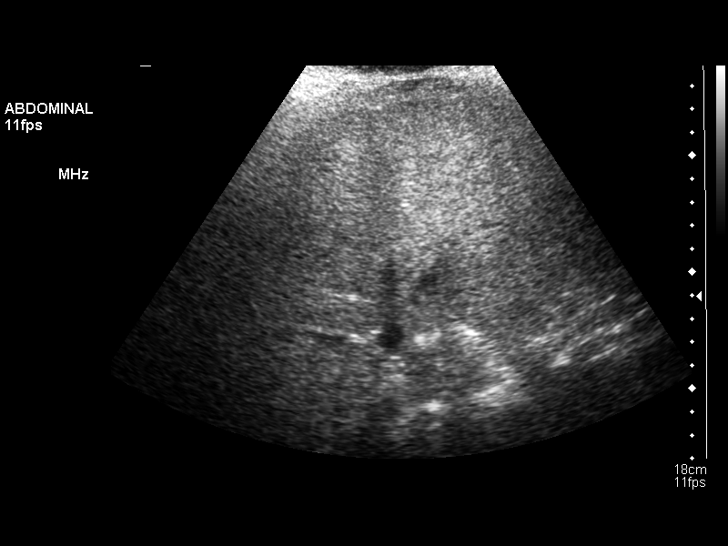
[im 46/85]
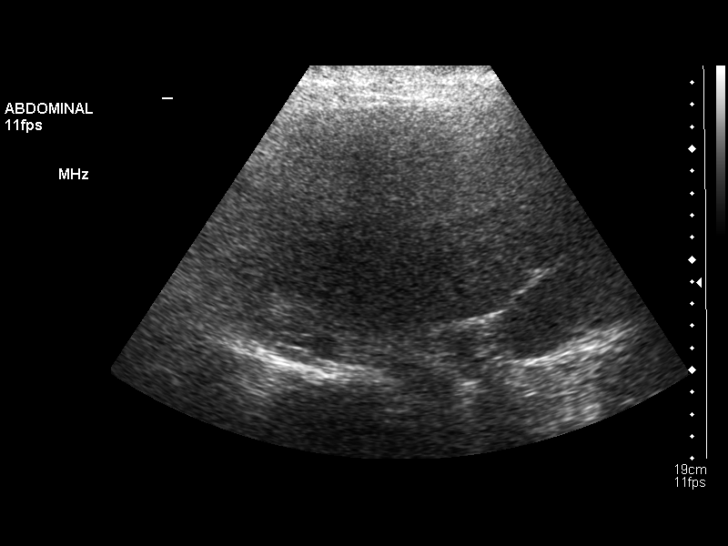
[im 53/85]
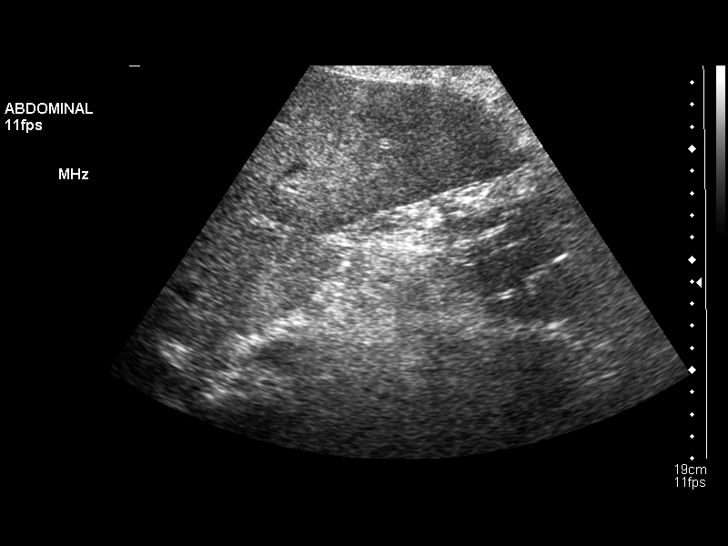
[im 57/85]
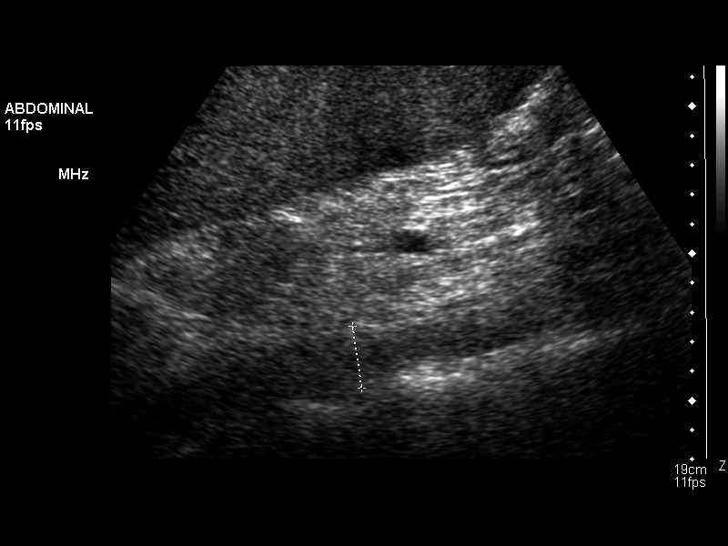
[im 64/85]
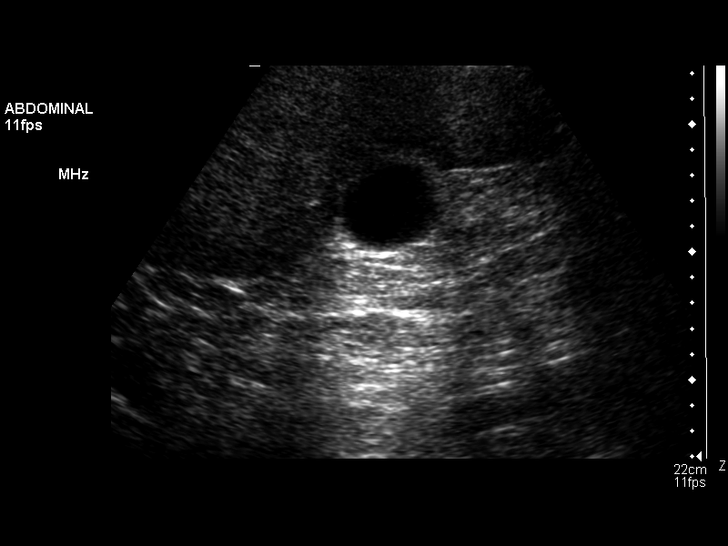
[im 71/85]
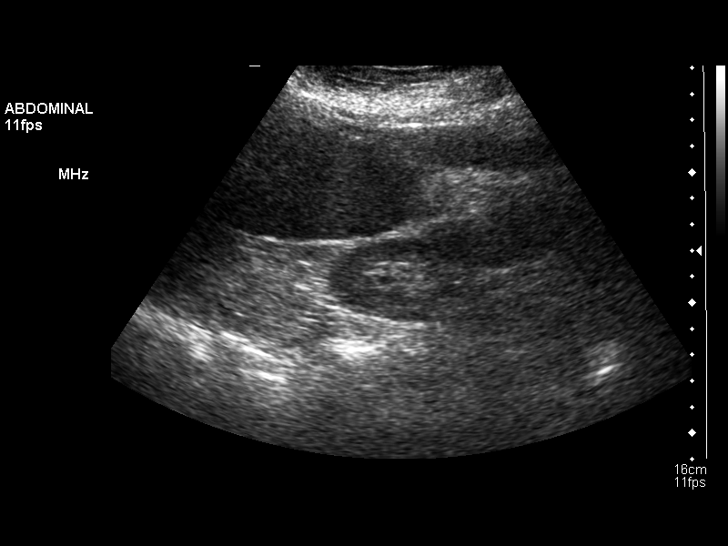
[im 78/85]
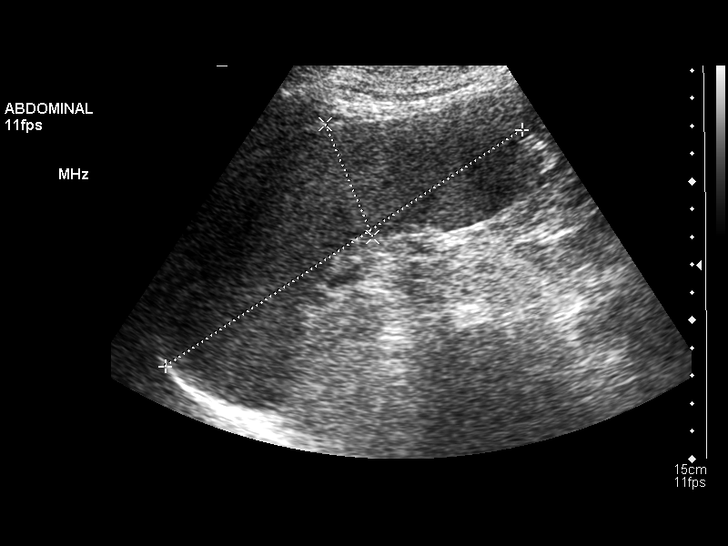
[im 85/85]
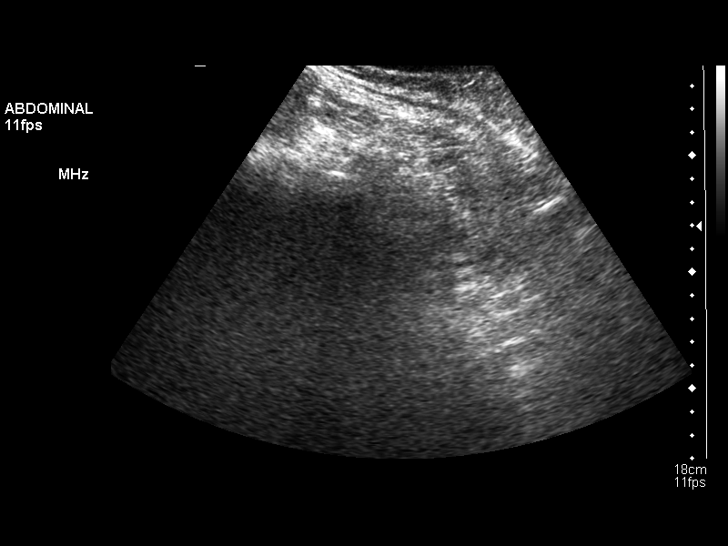

[14 of 25 positions shown; findings below may reference images not displayed]

FINDINGS: There is no evidence of gallstones or gallbladder wall
thickening.  There is no evidence of biliary ductal dilatation,
with common bile duct measuring 5-6 mm.  The liver is enlarged,
with the inferior margin right hepatic lobe extending below the
lower pole of the right kidney.  No focal liver masses are
identified sonographically.  There is no evidence of ascites.  Mild
splenomegaly is also noted with spleen measuring approximately 14
cm length.

Visualized portions of the IVC and pancreas are unremarkable.  Both
kidneys are normal in appearance there is no evidence of renal mass
or hydronephrosis.  Visualized portions of the abdominal aorta are
nondilated.
IMPRESSION: 1.  No evidence of gallstones or biliary ductal dilatation.
2.  Hepatomegaly and splenomegaly.  No evidence of ascites.

## 2010-01-01 ENCOUNTER — Ambulatory Visit: Payer: Self-pay | Admitting: Internal Medicine

## 2010-01-01 LAB — CONVERTED CEMR LAB
Albumin: 3.9 g/dL (ref 3.5–5.2)
BUN: 23 mg/dL (ref 6–23)
Basophils Relative: 0.3 % (ref 0.0–3.0)
CO2: 32 meq/L (ref 19–32)
Calcium: 9.9 mg/dL (ref 8.4–10.5)
Chloride: 90 meq/L — ABNORMAL LOW (ref 96–112)
Eosinophils Absolute: 0.1 10*3/uL (ref 0.0–0.7)
Eosinophils Relative: 1.2 % (ref 0.0–5.0)
HCT: 40.7 % (ref 36.0–46.0)
INR: 1.3 — ABNORMAL HIGH (ref 0.8–1.0)
Lymphs Abs: 2.4 10*3/uL (ref 0.7–4.0)
MCHC: 33.4 g/dL (ref 30.0–36.0)
MCV: 87.8 fL (ref 78.0–100.0)
Monocytes Absolute: 0.6 10*3/uL (ref 0.1–1.0)
Neutrophils Relative %: 64.6 % (ref 43.0–77.0)
Phosphorus: 3.9 mg/dL (ref 2.3–4.6)
Potassium: 3.8 meq/L (ref 3.5–5.1)
RBC: 4.63 M/uL (ref 3.87–5.11)
WBC: 8.9 10*3/uL (ref 4.5–10.5)

## 2010-01-07 ENCOUNTER — Encounter: Payer: Self-pay | Admitting: Internal Medicine

## 2010-01-08 ENCOUNTER — Ambulatory Visit: Payer: Self-pay | Admitting: Internal Medicine

## 2010-01-09 LAB — CONVERTED CEMR LAB
Albumin: 3.8 g/dL (ref 3.5–5.2)
BUN: 20 mg/dL (ref 6–23)
CO2: 32 meq/L (ref 19–32)
Calcium: 9.5 mg/dL (ref 8.4–10.5)
Chloride: 91 meq/L — ABNORMAL LOW (ref 96–112)
Phosphorus: 3.6 mg/dL (ref 2.3–4.6)
Potassium: 3.4 meq/L — ABNORMAL LOW (ref 3.5–5.1)

## 2010-01-17 ENCOUNTER — Ambulatory Visit: Payer: Self-pay | Admitting: Internal Medicine

## 2010-01-18 LAB — CONVERTED CEMR LAB
Albumin: 4 g/dL (ref 3.5–5.2)
Ammonia: 34 umol/L (ref 11–35)
CO2: 28 meq/L (ref 19–32)
Calcium: 9.5 mg/dL (ref 8.4–10.5)
Chloride: 90 meq/L — ABNORMAL LOW (ref 96–112)
Glucose, Bld: 84 mg/dL (ref 70–99)
Potassium: 4.1 meq/L (ref 3.5–5.1)
Sodium: 128 meq/L — ABNORMAL LOW (ref 135–145)

## 2010-01-22 ENCOUNTER — Ambulatory Visit: Payer: Self-pay | Admitting: Internal Medicine

## 2010-01-23 LAB — CONVERTED CEMR LAB
Ammonia: 29 umol/L (ref 11–35)
CO2: 29 meq/L (ref 19–32)
Calcium: 10.3 mg/dL (ref 8.4–10.5)
Creatinine, Ser: 0.8 mg/dL (ref 0.4–1.2)
GFR calc non Af Amer: 81.28 mL/min (ref >60)
Glucose, Bld: 88 mg/dL (ref 70–99)
Potassium: 4.1 meq/L (ref 3.5–5.1)
Sodium: 133 meq/L — ABNORMAL LOW (ref 135–145)

## 2010-01-29 ENCOUNTER — Encounter: Payer: Self-pay | Admitting: Internal Medicine

## 2010-02-01 ENCOUNTER — Encounter: Payer: Self-pay | Admitting: Internal Medicine

## 2010-02-04 ENCOUNTER — Encounter: Admission: RE | Admit: 2010-02-04 | Discharge: 2010-02-04 | Payer: Self-pay | Admitting: Obstetrics & Gynecology

## 2010-02-05 ENCOUNTER — Telehealth: Payer: Self-pay | Admitting: Internal Medicine

## 2010-02-06 ENCOUNTER — Ambulatory Visit (HOSPITAL_COMMUNITY): Admission: RE | Admit: 2010-02-06 | Discharge: 2010-02-07 | Payer: Self-pay | Admitting: Surgery

## 2010-02-06 ENCOUNTER — Encounter (INDEPENDENT_AMBULATORY_CARE_PROVIDER_SITE_OTHER): Payer: Self-pay | Admitting: Surgery

## 2010-02-07 ENCOUNTER — Encounter (INDEPENDENT_AMBULATORY_CARE_PROVIDER_SITE_OTHER): Payer: Self-pay | Admitting: *Deleted

## 2010-02-22 ENCOUNTER — Encounter: Payer: Self-pay | Admitting: Internal Medicine

## 2010-02-25 ENCOUNTER — Encounter: Payer: Self-pay | Admitting: Internal Medicine

## 2010-03-08 ENCOUNTER — Telehealth: Payer: Self-pay | Admitting: Internal Medicine

## 2010-03-13 ENCOUNTER — Ambulatory Visit: Payer: Self-pay | Admitting: Internal Medicine

## 2010-03-13 LAB — CONVERTED CEMR LAB
ALT: 22 units/L (ref 0–35)
AST: 32 units/L (ref 0–37)
Basophils Absolute: 0 10*3/uL (ref 0.0–0.1)
Chloride: 94 meq/L — ABNORMAL LOW (ref 96–112)
Creatinine, Ser: 0.8 mg/dL (ref 0.4–1.2)
Eosinophils Absolute: 0.1 10*3/uL (ref 0.0–0.7)
HCT: 38.8 % (ref 36.0–46.0)
Hemoglobin: 13.4 g/dL (ref 12.0–15.0)
Lymphs Abs: 3 10*3/uL (ref 0.7–4.0)
MCHC: 34.6 g/dL (ref 30.0–36.0)
Monocytes Absolute: 0.6 10*3/uL (ref 0.1–1.0)
Neutro Abs: 4.1 10*3/uL (ref 1.4–7.7)
Platelets: 181 10*3/uL (ref 150.0–400.0)
RDW: 14.8 % — ABNORMAL HIGH (ref 11.5–14.6)
Sodium: 134 meq/L — ABNORMAL LOW (ref 135–145)
Total Bilirubin: 1.1 mg/dL (ref 0.3–1.2)
Total Protein: 8 g/dL (ref 6.0–8.3)

## 2010-03-20 ENCOUNTER — Ambulatory Visit: Payer: Self-pay | Admitting: Internal Medicine

## 2010-03-28 ENCOUNTER — Encounter: Payer: Self-pay | Admitting: Internal Medicine

## 2010-04-24 ENCOUNTER — Ambulatory Visit: Payer: Self-pay | Admitting: Internal Medicine

## 2010-04-24 LAB — CONVERTED CEMR LAB
ALT: 31 units/L (ref 0–35)
AST: 39 units/L — ABNORMAL HIGH (ref 0–37)
Albumin: 4.5 g/dL (ref 3.5–5.2)
Alkaline Phosphatase: 86 units/L (ref 39–117)
BUN: 18 mg/dL (ref 6–23)
CO2: 30 meq/L (ref 19–32)
Calcium: 10.1 mg/dL (ref 8.4–10.5)
Creatinine, Ser: 0.7 mg/dL (ref 0.4–1.2)
Glucose, Bld: 90 mg/dL (ref 70–99)

## 2010-05-02 ENCOUNTER — Encounter: Payer: Self-pay | Admitting: Internal Medicine

## 2010-05-15 ENCOUNTER — Ambulatory Visit: Payer: Self-pay | Admitting: Internal Medicine

## 2010-06-11 ENCOUNTER — Telehealth: Payer: Self-pay | Admitting: Internal Medicine

## 2010-07-03 ENCOUNTER — Ambulatory Visit: Payer: Self-pay | Admitting: Internal Medicine

## 2010-07-04 LAB — CONVERTED CEMR LAB
Albumin: 4 g/dL (ref 3.5–5.2)
BUN: 14 mg/dL (ref 6–23)
Chloride: 101 meq/L (ref 96–112)
Phosphorus: 3 mg/dL (ref 2.3–4.6)

## 2010-07-15 ENCOUNTER — Encounter: Payer: Self-pay | Admitting: Internal Medicine

## 2010-07-22 ENCOUNTER — Encounter: Payer: Self-pay | Admitting: Internal Medicine

## 2010-08-12 ENCOUNTER — Ambulatory Visit: Payer: Self-pay | Admitting: Internal Medicine

## 2010-09-26 IMAGING — CT CT ABDOMEN WO/W CM
3 of 9 series · 12 of 46 positions shown, 18 images · IV contrast (Omnipaque 300)
Comparison: Ultrasound abdomen of 09/28/2008

CT ABDOMEN

CLINICAL DATA: Abdominal pain, cirrhosis, umbilical hernia, recent
paracentesis

CT ABDOMEN WITHOUT AND WITH CONTRAST
CT PELVIS WITH CONTRAST
TECHNIQUE: Multidetector CT imaging of the abdomen was performed
initially following the standard protocol before administration of
intravenous contrast.  Multidetector CT imaging of the abdomen and
pelvis was then performed following the standard protocol during
the bolus injection of intravenous contrast.
Contrast: 125 ml Omnipaque-GQQ

[Series 5: arterial 3.0 b30f st · axial · arterial · 0.85mm/px · z∈[-238,-198]mm · 2 of 101 slices shown]
[im 13/101  soft-tissue]
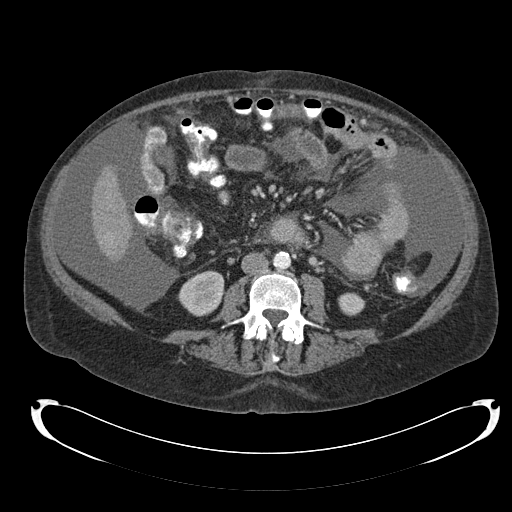
[im 26/101  soft-tissue]
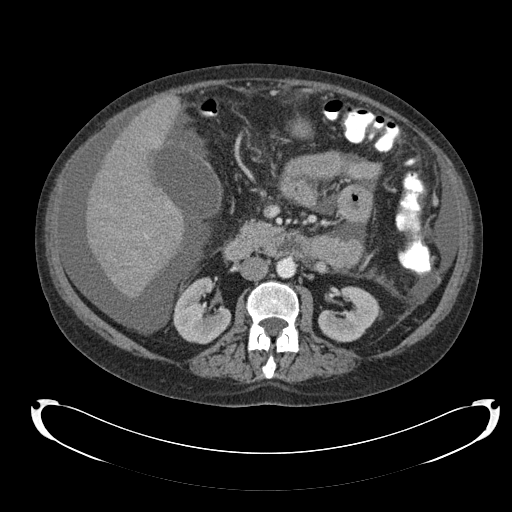

[Series 6: ap_venous 5.0 b30f · axial · 0.85mm/px · z∈[-422,-37]mm · 7 of 103 slices shown, 12 images]
[im 13/103  soft-tissue]
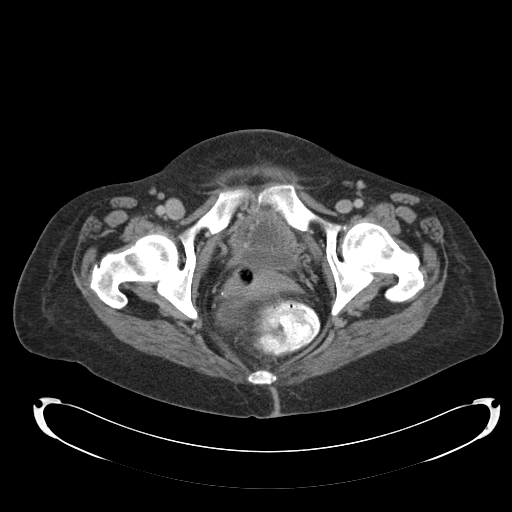
[im 13/103  bone]
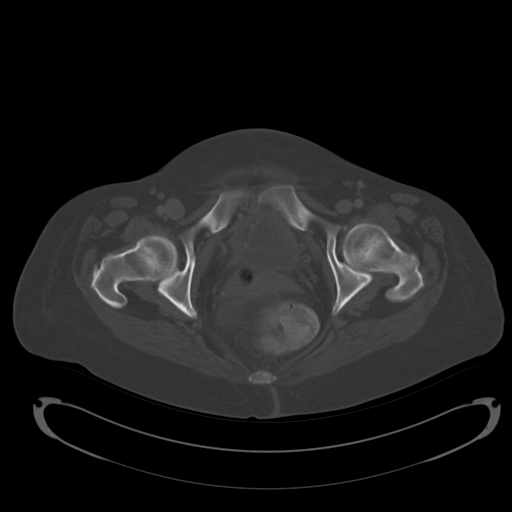
[im 26/103  soft-tissue]
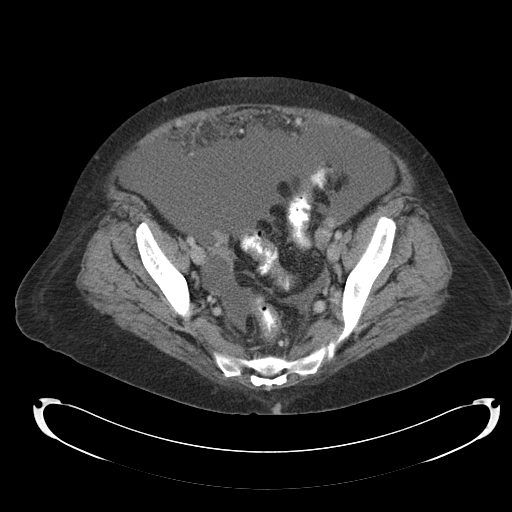
[im 39/103  soft-tissue]
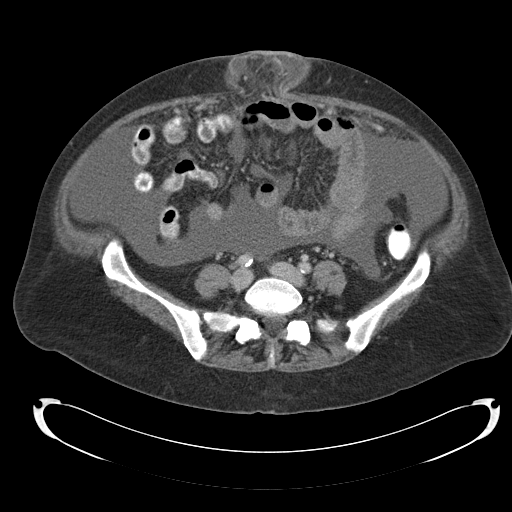
[im 52/103  soft-tissue]
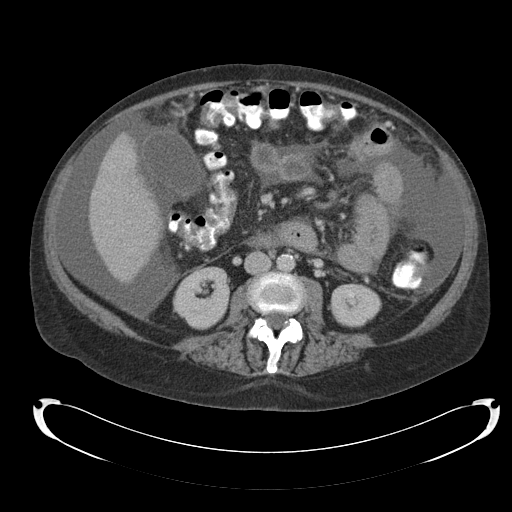
[im 52/103  lung]
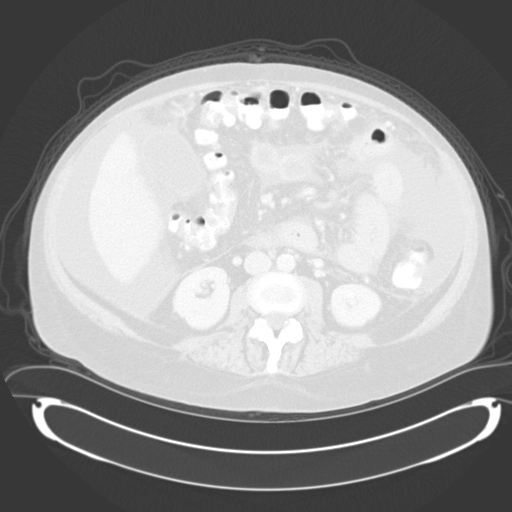
[im 64/103  soft-tissue]
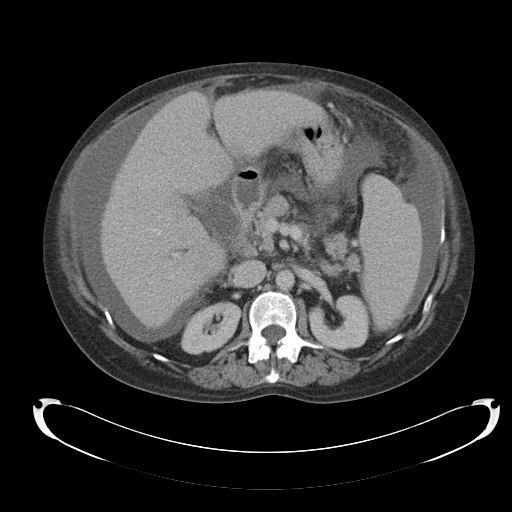
[im 64/103  lung]
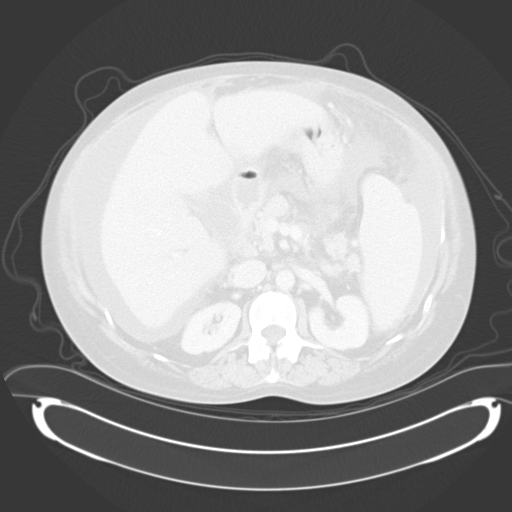
[im 77/103  soft-tissue]
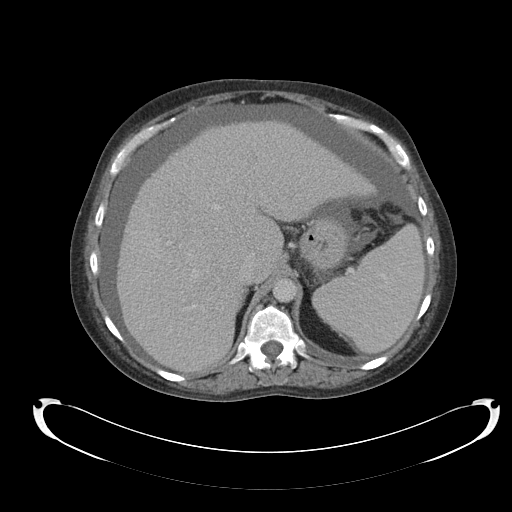
[im 77/103  lung]
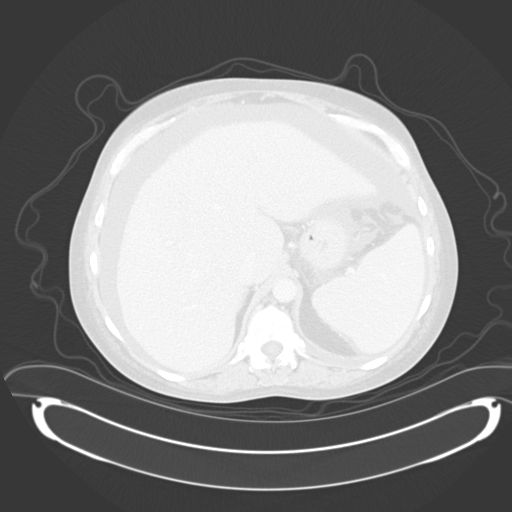
[im 90/103  soft-tissue]
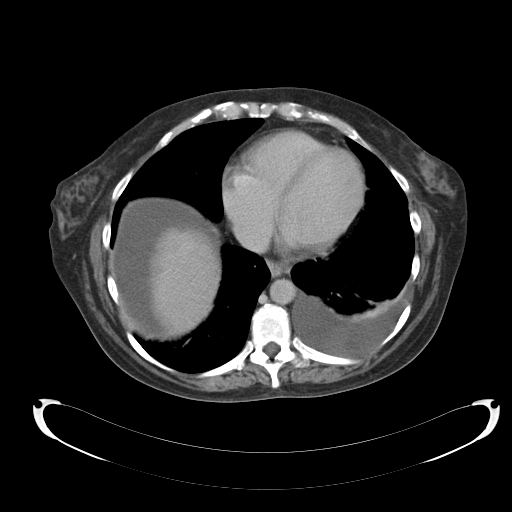
[im 90/103  lung]
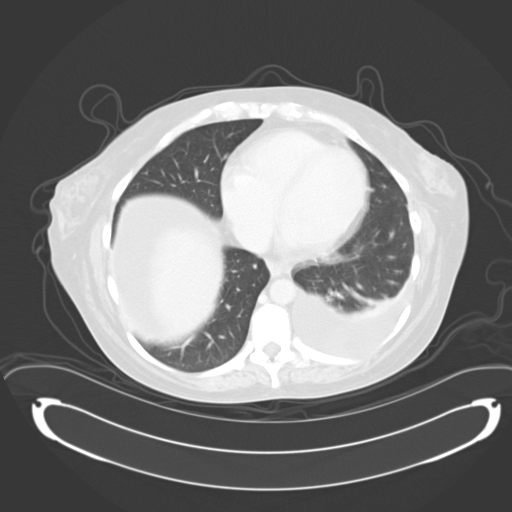

[Series 602: <mpr thick range> · coronal · 0.78mm/px · 3 of 101 slices shown, 4 images]
[im 26/101  soft-tissue]
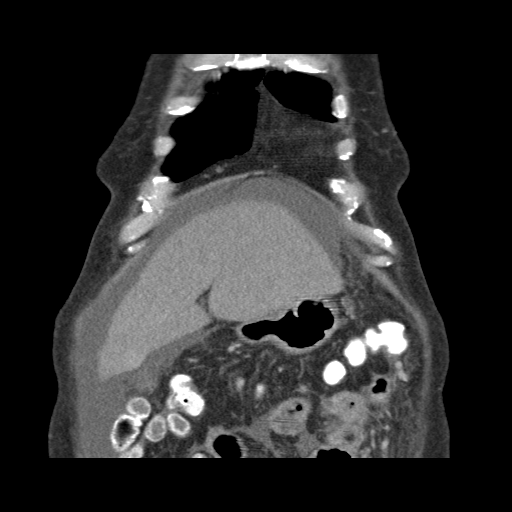
[im 51/101  soft-tissue]
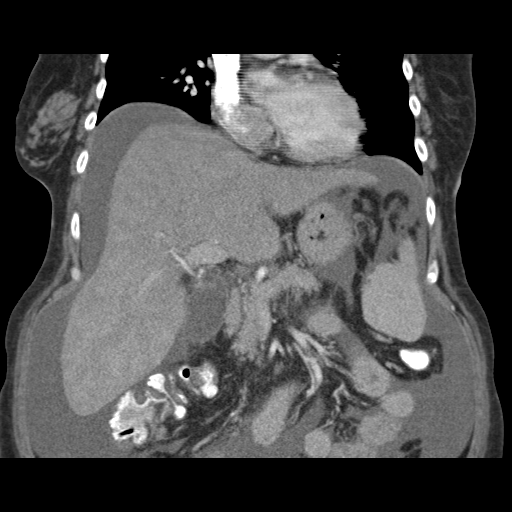
[im 51/101  bone]
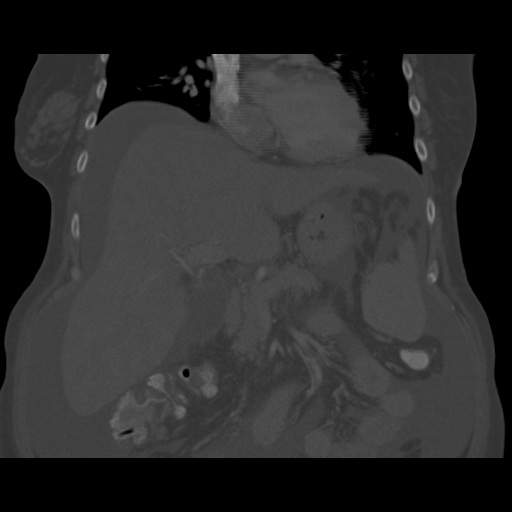
[im 76/101  soft-tissue]
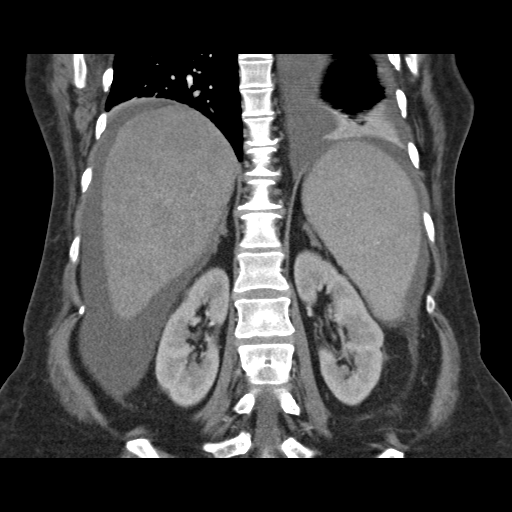

[12 of 46 positions shown; findings below may reference images not displayed]

FINDINGS: There is a small left pleural effusion present with mild
basilar linear atelectasis noted.  On the unenhanced phase, no
calcified gallstones are seen although there is slight increased
density in the gallbladder which may represent sludge, since no
gallstones were demonstrated on recent ultrasound.  No renal
calculi are seen.

On the arterial phase, a moderate amount of ascites is noted
throughout the peritoneal cavity.  The liver is inhomogeneous in
enhancement and hepatic margins are slightly irregular consistent
with changes of cirrhosis. The liver is mildly enlarged.  No focal
hepatic lesion is seen and no ductal dilatation is noted. There is
mild to moderate splenomegaly present. The pancreas is normal in
size and the pancreatic duct is not dilated.  The adrenal glands
are symmetrical and normal.  The kidneys enhance and on delayed
images the pelvocaliceal systems appear normal.

On the portal venous phase, the portal vein is patent as is the
splenic vein.  The abdominal aorta is normal in caliber. No
adenopathy is seen.
IMPRESSION: 1.  Changes of cirrhosis with enlarged, inhomogeneous and irregular
liver and moderate splenomegaly with a moderate amount of ascites
throughout the peritoneal cavity.
2.  The portal vein and splenic vein are patent.
3.  No focal hepatic lesion is seen and no ductal dilatation is
noted.

CT PELVIS
FINDINGS: There is some prominence of the mucosa of small bowel
loops throughout the abdomen and  pelvis which may be due to
hypoalbuminemia.  There is an umbilical hernia which contains fat
and ascites.  Again a moderate amount of ascites is noted
throughout the pelvis.  The uterus is normal in size.  No adnexal
lesion is seen.  The urinary bladder is decompressed and cannot be
evaluated.  The appendix and terminal ileum appear normal.  No bony
abnormality is seen.
IMPRESSION: 1.  Moderate amount of ascites.
2.  Some prominence of the mucosa of small bowel throughout the
abdomen and pelvis most consistent with changes of hypoproteinemia.
3.  Umbilical hernia containing fat and ascites.

## 2010-10-22 ENCOUNTER — Telehealth: Payer: Self-pay | Admitting: Internal Medicine

## 2010-10-27 ENCOUNTER — Encounter: Payer: Self-pay | Admitting: Obstetrics & Gynecology

## 2010-11-03 LAB — CONVERTED CEMR LAB
Ammonia: 24 umol/L (ref 11–35)
Folate: 1.3 ng/mL
IgA: 586 mg/dL — ABNORMAL HIGH (ref 68–378)
IgG (Immunoglobin G), Serum: 1726 mg/dL — ABNORMAL HIGH (ref 694–1618)
Saturation Ratios: 20.2 % (ref 20.0–50.0)
Vitamin B-12: 509 pg/mL (ref 211–911)

## 2010-11-05 NOTE — Letter (Signed)
Summary: Letter from patient  Letter from patient   Imported By: Lester [place] 04/02/2010 10:47:33  _____________________________________________________________________  External Attachment:    Type:   Image     Comment:   External Document  Appended Document: Letter from patient I have spoken to pt's husband Richard. She will resume Metazolone 2.5 mg once daily and continue Lasix  40 mg/day. and Aldactone.  Please refill Lactulose as requested. Thanx DB  Appended Document: Letter from patient I have left a message for the patient to call back.   Appended Document: Letter from patient spoke with patient's husband, Richard. He statest that patient is out of town. He is going to try to contact her regarding what medications she would like sent to the mail in pharmacy. I have explained that she requested the meds be sent in before 04/04/10 due to changes in her insurance coverage. Richard says he will get in touch with patient and have her call back.  Appended Document: Letter from patient Patient only needs refills on lactulose sent to [facility]. She has refills on all other prescriptions.   Clinical Lists Changes  Medications: Rx of LACTULOSE 10 GM/15ML SOLN (LACTULOSE) Take 20 cc by mouth once daily;  #1800 ml x 0;  Signed;  Entered by: Lamona Curl CMA (AAMA);  Authorized by: Hart Carwin MD;  Method used: Faxed to [facility] [place], , , [place]  , Ph: [phone], Fax: [phone]    Prescriptions: LACTULOSE 10 GM/15ML SOLN (LACTULOSE) Take 20 cc by mouth once daily  #1800 ml x 0   Entered by:   Lamona Curl CMA (AAMA)   Authorized by:   Hart Carwin MD   Signed by:   Lamona Curl CMA (AAMA) on 04/03/2010   Method used:   Faxed to ...       [facility] [place] (mail-order)             , [place]         Ph: [phone]       Fax: [phone]   RxID:   [id number]

## 2010-11-05 NOTE — Progress Notes (Signed)
Summary: Labs  Phone Note Call from Patient Call back at Home Phone [phone]   Caller: Patient Call For: Dr. Juanda Chance Reason for Call: Talk to Nurse Summary of Call: pt. needs an order to have Labs done the end of Sept. Initial call taken by: Karna Christmas,  June 11, 2010 12:17 PM  Follow-up for Phone Call        Left message for patient to call back Darcey Nora RN, [facility]  June 11, 2010 1:54 PM  New labs entered for 07/01/10.  Patient  aware Follow-up by: Darcey Nora RN, CGRN,  June 11, 2010 2:04 PM

## 2010-11-05 NOTE — Progress Notes (Signed)
Summary: Med refills  Phone Note Call from Patient Call back at Home Phone [phone]   Call For: DR BRODIE Reason for Call: Talk to Nurse Summary of Call: SPIRONOLACTONE refill sent to [facility]. Also needs Hydrocodone sent to [facility] on [address]. Initial call taken by: Leanor Kail [facility],  October 08, 2009 11:46 AM  Follow-up for Phone Call        both rx's sent to pharmacy. Follow-up by: Hortense Ramal CMA Duncan Dull),  October 08, 2009 12:05 PM    New/Updated Medications: VICODIN 5-500 MG TABS (HYDROCODONE-ACETAMINOPHEN) Take 1 tablet by mouth every 12 hours as needed for pain. Prescriptions: VICODIN 5-500 MG TABS (HYDROCODONE-ACETAMINOPHEN) Take 1 tablet by mouth every 12 hours as needed for pain.  #60 x 1   Entered by:   Hortense Ramal CMA (AAMA)   Authorized by:   Hart Carwin MD   Signed by:   Hortense Ramal CMA (AAMA) on 10/08/2009   Method used:   Printed then faxed to ...       [facility]. [phone]* (retail)       [phone] [address].       [place], [place]  [address]       Ph: [phone]       Fax: [phone]   RxID:   [id number] ALDACTONE 50 MG TABS (SPIRONOLACTONE) Take 1 tablet by mouth once a day  #90 x 0   Entered by:   Hortense Ramal CMA (AAMA)   Authorized by:   Hart Carwin MD   Signed by:   Hortense Ramal CMA (AAMA) on 10/08/2009   Method used:   Electronically to        [facility]* (mail-order)             ,          Ph: [phone]       Fax: [phone]   RxID:   [id number]   Appended Document: Med refills I have spoken with Candy at [facility] to correct the aldactone rx that was originally sent to them. I sent an rx for 50mg  tablets 1 tablet daily. We have actually increased her rx to 100 mg daily. I asked that the rx be changed to Aldactone 50 mg tablet Take 2 tablets by mouth once daily #180 with 0 refills. Candy states that she will correct rx.

## 2010-11-05 NOTE — Letter (Signed)
Summary: [facility]   Imported By: Lester [place] 12/27/2009 08:55:09  _____________________________________________________________________  External Attachment:    Type:   Image     Comment:   External Document

## 2010-11-05 NOTE — Letter (Signed)
Summary: [facility]   Imported By: Sherian Rein 11/05/2009 09:40:43  _____________________________________________________________________  External Attachment:    Type:   Image     Comment:   External Document

## 2010-11-05 NOTE — Assessment & Plan Note (Signed)
Summary: 3 MO F/U.Marland KitchenMarland KitchenAS.   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 Her liver disease has improved. She had repair of a ventral hernia by Dr. Davina Poke on 02/06/10. Her repeat liver function tests on 03/13/10 are normal with  albumin being up to 4.3. Her last alcohol intake was in 09/17/08. She is on Lasix, Zaroxolyn and Aldactone. Her weight has remained stable around 173 pounds and she has no ascites. Her next appointment with the surgeon is this week. She wears an abdominal binder. She is satisfied with the results of the surgery.   GI Review of Systems    Reports bloating.      Denies abdominal pain, acid reflux, belching, chest pain, dysphagia with liquids, dysphagia with solids, heartburn, loss of appetite, nausea, vomiting, vomiting blood, weight loss, and  weight gain.      Reports diarrhea.     Denies anal fissure, black tarry stools, change in bowel habit, constipation, diverticulosis, fecal incontinence, heme positive stool, hemorrhoids, irritable bowel syndrome, jaundice, light color stool, liver problems, rectal bleeding, and  rectal pain.    Current Medications (verified): 1)  Lidoderm 5 % Ptch (Lidocaine) .... As Needed 2)  Aldactone 50 Mg Tabs (Spironolactone) .... Take 2 Tablet By Mouth Once A Day 3)  Vicodin 5-500 Mg Tabs (Hydrocodone-Acetaminophen) .... Take 1 Tablet By Mouth Every 12 Hours As Needed For Pain. 4)  Abdominal Binder .... Size To Be Fitted. Use For Umbilical Hernia Treatment. 5)  Furosemide 40 Mg Tabs (Furosemide) .... Take 2 Tablets By Mouth Once Daily 6)  Dialyvite 800/ultra D  Tabs (Multiple Vitamins-Minerals) .... One Tablet By Mouth Once Daily 7)  Multivitamins   Tabs (Multiple  Vitamin) .... One Tablet By Mouth Once Daily 8)  Klor-Con M20 20 Meq Cr-Tabs (Potassium Chloride Crys Cr) .... Take 2 Tablet By Mouth Once A Day 9)  Zaroxolyn 2.5 Mg Tabs (Metolazone) .... Take One By Mouth Every Day. 10)  Gas-X 80 Mg Chew (Simethicone) .... As Needed 11)  Zyrtec Allergy 10 Mg Caps (Cetirizine Hcl) .... As Needed 12)  Lactulose 10 Gm/2ml Soln (Lactulose) .... Take 20 Cc By Mouth Once Daily  Allergies (verified): 1)  ! Vancomycin  Past History:  Past Medical History: Reviewed history from 01/22/2009 and no changes required. 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) ALCOHOL ABUSE (ICD-305.00) TRANSAMINASES, SERUM, ELEVATED (ICD-790.4) ANAL FISSURE, HX OF (ICD-V13.3) DYSMENORRHEA (ICD-625.3) INSOMNIA UNSPECIFIED (ICD-780.52) Hx of SCARLET FEVER (ICD-034.1) EXTERNAL HEMORRHOIDS (ICD-455.3) LIVER FUNCTION TESTS, ABNORMAL, HX OF (ICD-V12.2) SPLENOMEGALY (ICD-789.2) HEPATOMEGALY (ICD-789.1) HERNIA, UMBILICAL (ICD-553.1) HYPERLIPIDEMIA (ICD-272.4) ENDOMETRIOSIS (ICD-617.9) GERD (ICD-530.81) OSTEOARTHRITIS (ICD-715.90) ALLERGIC RHINITIS (ICD-477.9)    Past Surgical History: bilateral Heel Spur-foot surgery Caesarean section Knee x4 - Maltracting Patella (1x on right, 3x on left) Cone biopsy-cervix Lt ankle surgery Hernia Surgery  Family History: Reviewed history from 03/14/2008 and no changes required. Patient adopted  No known family history  Social History: Reviewed history from 10/23/2008 and no changes required. Occupation: Self-Employed-Recruiter Married Patient has 2 children Current Smoker- 1 1/2 Packs per day Alcohol use-yes-0 -recently quit  Drug use-no Regular exercise-no  Review  of Systems  The patient complains of allergy/sinus, arthritis/joint pain, back pain, cough, fatigue, headaches-new, menstrual pain, muscle pains/cramps, nosebleeds, and thirst - excessive.  The patient denies anemia, anxiety-new, blood in urine, breast changes/lumps, change in vision, confusion, coughing up blood, depression-new, fainting, fever, hearing problems, heart murmur, heart rhythm changes, itching, night sweats, pregnancy symptoms, shortness of breath, skin rash, sleeping problems, sore throat, swelling of feet/legs, swollen lymph glands, thirst - excessive , urination - excessive , urination changes/pain, urine leakage, vision changes, and voice change.         Pertinent positive and negative review of systems were noted in the above HPI. All other ROS was otherwise negative.   Vital Signs:  Patient profile:   50 year old female Height:      69 inches Weight:      173.25 pounds BMI:     25.68 Pulse rate:   68 / minute Pulse rhythm:   regular BP sitting:   90 / 60  (left arm) Cuff size:   regular  Vitals Entered By: June McMurray CMA Duncan Dull) (March 20, 2010 8:33 AM)  Physical Exam  General:  alert and oriented. Eyes:  non icteric. Neck:  Supple; no masses or thyromegaly. Lungs:  Clear throughout to auscultation. Heart:  Regular rate and rhythm; no murmurs, rubs,  or bruits. Abdomen:  soft flat abdomen with well-healed horizontal scar and normoactive bowel sounds. No tenderness. No fluid wave. Enlarged liver 5 cm below right costal margin, nontender. Spleen is not enlarged. Extremities:  no edema. Neurologic:  no asterixis. Skin:  spider nevi present on the face, arms and trunk. Psych:  Alert and cooperative. Normal mood and affect.   Impression & Recommendations:  Problem # 1:  CIRRHOSIS-ALCOHOLIC (ICD-571.2) Patient has compensated chronic cirrhosis with complete resolution of ascites. There is no evidence of encephalopathy.on exam aand on  her blood work. Liver  function tests are back to normal as well as her prothrombin time and ammonia time. She has been sober for 2 years. She is doing very well taking care of herself. We will stop her Zaroxolyn and continue her Lasix 80 mg a day for another week then reduce it to 40 mg daily. She will continue Aldactone and potassium supplements. Her blood tests will be repeated in 4 weeks. I will see her again in 8 weeks. She needs to monitor her weight daily.  Problem # 2:  ASCITES (ICD-789.59) Resolved.  Problem # 3:  ALCOHOL ABUSE (ICD-305.00) Patient has been abstinent since 09/17/08.  Problem # 4:  HERNIA, UMBILICAL (ICD-553.1) Patient is status post successful repair of her umbilical hernia. She is doing well and is being followed by her surgeon.  Patient Instructions: 1)  stop Zaroxolyn. 2)  Continue salt free diet. 3)  Continue Aldactone 100 mg daily. 4)  Continue Lasix 80 mg daily x 1 week then 40 mg daily. 5)  Repeat metabolic panel in 4 weeks. 6)  Office visit 8 weeks. 7)  Copy sent to : Dr Davina Poke 8)  The medication list was reviewed and reconciled.  All changed / newly prescribed medications were explained.  A complete medication list was provided to the patient / caregiver.

## 2010-11-05 NOTE — Progress Notes (Signed)
Summary: Out of meds  Phone Note Call from Patient Call back at Home Phone [phone]   Call For: Dr Juanda Chance Reason for Call: Talk to Nurse Summary of Call: Is out of Furosemide 40mg  takes 2 a day and wants it called in to a new pharmacy. [employer]. Initial call taken by: Leanor Kail [facility],  December 10, 2009 11:43 AM  Follow-up for Phone Call        Prescription sent. Follow-up by: Hortense Ramal CMA Duncan Dull),  December 10, 2009 11:51 AM    New/Updated Medications: FUROSEMIDE 40 MG TABS (FUROSEMIDE) Take 2 tablets by mouth once daily Prescriptions: FUROSEMIDE 40 MG TABS (FUROSEMIDE) Take 2 tablets by mouth once daily  #60 x 2   Entered by:   Hortense Ramal CMA (AAMA)   Authorized by:   Hart Carwin MD   Signed by:   Hortense Ramal CMA (AAMA) on 12/10/2009   Method used:   Electronically to        [facility]. [phone]* (retail)       [phone] [address]       [place], [place]  [address]       Ph: [phone]       Fax: [phone]   RxID:   [id number]

## 2010-11-05 NOTE — Progress Notes (Signed)
Summary: Med/Labs update  ---- Converted from flag ---- ---- 11/14/2009 7:57 AM, Hart Carwin MD wrote: I have discussed repair of umbilical hernia with the pt. Her surgeon  recommended more vigorous diuretic regimen to  treat the ascites which keeps him from being able to do the surgery. We decided to increase Lasix from 40 to 60 mg/day and add Zaroxalyn 2.5 mg/day and leave the Sprironolactone at 100mg /day. She will have weekly renal profile, and ammonia  drawn in our office staring Nov 19, 2009. Please send Zaroxalyn 2.5 mg, #30, 1 by mouth once daily, 3 refills. and put the labs in Thanx ------------------------------  Phone Note Outgoing Call   Call placed by: Laureen Ochs LPN,  November 14, 2009 8:40 AM Call placed to: Patient Summary of Call: Above MD orders reviewed w/pt. She will have labs done every Monday, the orders are in IDX. Pt. to keep scheduled office visit on 12-17-09 at 9:30am. Pt. instructed to call back as needed.  Initial call taken by: Laureen Ochs LPN,  November 14, 2009 8:44 AM    New/Updated Medications: ZAROXOLYN 2.5 MG TABS (METOLAZONE) Take one by mouth every day. Prescriptions: ZAROXOLYN 2.5 MG TABS (METOLAZONE) Take one by mouth every day.  #30 x 1   Entered by:   Laureen Ochs LPN   Authorized by:   Hart Carwin MD   Signed by:   Laureen Ochs LPN on 78/46/9629   Method used:   Electronically to        [facility]. [phone]* (retail)       [phone] [address]       [place], [place]  [address]       Ph: [phone]       Fax: [phone]   RxID:   [id number]

## 2010-11-05 NOTE — Letter (Signed)
Summary: Letter regarding lab work & jury duty/Patient  Letter regarding lab work & jury duty/Patient   Imported By: Sherian Rein 03/12/2010 12:04:05  _____________________________________________________________________  External Attachment:    Type:   Image     Comment:   External Document

## 2010-11-05 NOTE — Discharge Summary (Signed)
Summary: Repair of Ventral Hernia    NAME:  [name], HASKEW                ACCOUNT NO.:  [id number]      MEDICAL RECORD NO.:  [id number]          PATIENT TYPE:  OIB      LOCATION:  5125                         FACILITY:  [facility]      PHYSICIAN:  Maisie Fus A. Cornett, M.D.DATE OF BIRTH:  [birth year]      DATE OF ADMISSION:  02/06/2010   DATE OF DISCHARGE:  02/07/2010                                  DISCHARGE SUMMARY      ADMITTING DIAGNOSIS:   1. Ventral hernia.   2. Cirrhosis of ascites.      DISCHARGE DIAGNOSIS:   1. Ventral hernia.   2. Cirrhosis of ascites.      PROCEDURE:  Open repair of ventral hernia with biologic mesh.      BRIEF HISTORY:  The patient is a 50 year old female with cirrhosis.  She   has problems with chronic ascites and had a very large preumbilical   hernia.  She had been seen before, but her liver function ascites was   too great to do the procedure.  She got her ascites under control with   help of Dr. Alden Hipp.  She has quite miserable pain and her hernia has been   quite disabling and  made life very difficult for her.  She has had   functions of her livers which was quite good or coagulation studies were   very good and she got her ascites under control.  We had a long   discussion about this and increased complication rates of these patients   especially with ascites with breakdown of the hernia, recurrent urinary   infection, hernias, intraabdominal infections, sepsis, and so forth.   Also worsening of hepatic function preoperatively.  She understood all   these potential risk after numerous visits in the office and we felt at   this point now since she could no longer function well and her quality   of life was so poor and in fact her liver function was stable, that   surgery at this point would be the best thing for her.      HOSPITAL COURSE:  Please see operative note for details.  Hospital   course unremarkable.  Postop labs look good.  Bilirubin was  1.7.  Postop   AST and ALT were normal.  Her creatinine was 1.36, which is near her   baseline.  BUN is 27.  Sodium of 30 postop and potassium of 4.5.  She   had a slight increased in her ascites postoperatively, which is not   expected.  Her incision was dry.  Her wound was intact.  Her abdomen was   soft and appropriate, incisional tenderness, but no peritonitis.  Her   white count was 19,000 which I think is secondary to distress and her   hemoglobin was 12, which is stable.  She wished to go home, was   tolerating a regular diet, and felt to be ready to go home.      DISCHARGE INSTRUCTIONS:  I will see  her back in 10-14 days.  She will   resume all her medications, which were outlined in her medical   reconciliation list, which I have seen and signed.  I will write her for   oxycodone 5-10   mg p.o. q.4 h. p.r.n. pain.  She may not to drive or lift until I will   see her back.  She will wear a binder.  She will shower tomorrow and   place Neosporin over the incision and a dry dressing.      CONDITION AT DISCHARGE:  Stable.               Thomas A. Cornett, M.D.            TAC/MEDQ  D:  02/07/2010  T:  02/07/2010  Job:  [id number]      cc:   Dr. Alden Hipp      Electronically Signed by Harriette Bouillon M.D. on 02/16/2010 08:28:37 AM

## 2010-11-05 NOTE — Progress Notes (Signed)
Summary: New orders  Phone Note Outgoing Call   Call placed by: Laureen Ochs LPN,  December 14, 2009 12:58 PM Call placed to: Patient Summary of Call: MD orders from labs 12-13-09 reviewed w/pt.  Her weight today is 168. She wants to keep OV with Dr.Jaydynn Wolford on 12-17-09. She will get labs checked on 12-20-09. Pt. instructed to call back as needed.  Initial call taken by: Laureen Ochs LPN,  December 14, 2009 12:58 PM

## 2010-11-05 NOTE — Medication Information (Signed)
Summary: Request for refills/Patient  Request for refills/Patient   Imported By: Sherian Rein 01/08/2010 08:36:13  _____________________________________________________________________  External Attachment:    Type:   Image     Comment:   External Document

## 2010-11-05 NOTE — Letter (Signed)
Summary: [facility]   Imported By: Sherian Rein 05/16/2010 10:57:12  _____________________________________________________________________  External Attachment:    Type:   Image     Comment:   External Document

## 2010-11-05 NOTE — Progress Notes (Signed)
Summary: FYI  Phone Note Call from Patient Call back at [facility] Phone [phone]   Caller: Patient Call For: Dr. Juanda Chance Reason for Call: Talk to Nurse Summary of Call: hernia surgery is scheduled for tomorrow at [facility] at 8:30AM with Dr. Luisa Hart Initial call taken by: Vallarie Mare,  Feb 05, 2010 2:45 PM  Follow-up for Phone Call        OK Follow-up by: Hart Carwin MD,  Feb 05, 2010 3:48 PM

## 2010-11-05 NOTE — Assessment & Plan Note (Signed)
Summary: 3 mth f/u--ch.   History of Present Illness Visit Type: Follow-up Visit Primary GI MD: Lina Sar MD Primary Tinia Oravec: [facility] Requesting Mohmed Farver: n/a Chief Complaint: F/u for Cirrhosis. 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 She has abstained from alcohol since December 2009. She has compensated cirrhosis. She has done extremely well and all her liver function tests are normal including an albumin of 3.6. She has reduced her diuretic regimen and has gained some weight. She is status post repair of a large umbilical hernia in May 2011.   GI Review of Systems      Denies abdominal pain, acid reflux, belching, bloating, chest pain, dysphagia with liquids, dysphagia with solids, heartburn, loss of appetite, nausea, vomiting, vomiting blood, weight loss, and  weight gain.        Denies anal fissure, black tarry stools, change in bowel habit, constipation, diarrhea, diverticulosis, fecal incontinence, heme positive stool, hemorrhoids, irritable bowel syndrome, jaundice, light color stool, liver problems, rectal bleeding, and  rectal pain.    Current Medications (verified): 1)  Lidoderm 5 % Ptch (Lidocaine) .... As Needed 2)  Aldactone 50 Mg Tabs (Spironolactone) .... Take 2 Tablet By Mouth Once A Day 3)  Vicodin 5-500 Mg Tabs (Hydrocodone-Acetaminophen) .... Take 1 Tablet By Mouth Every 12 Hours As Needed For Pain. 4)  Furosemide 40 Mg Tabs (Furosemide) .... Take 1 Tablet  By Mouth Once Daily 5)  Dialyvite 800/ultra D  Tabs (Multiple Vitamins-Minerals) .... One Tablet By Mouth Once Daily 6)  Multivitamins   Tabs (Multiple Vitamin) .... One Tablet By Mouth Once Daily 7)  Gas-X 80 Mg Chew (Simethicone) .... As Needed 8)  Zyrtec Allergy 10 Mg Caps (Cetirizine Hcl) .... As Needed 9)  Tylenol Extra Strength 500 Mg Tabs (Acetaminophen) .... Take As Needed 10)  Gabapentin 100 Mg Caps  (Gabapentin) .... One Tablet By Mouth Once Daily  Allergies (verified): 1)  ! 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 bilateral Heel Spur-foot surgery Caesarean section Knee x4 - Maltracting Patella (1x on right, 3x on left) Cone biopsy-cervix Lt ankle surgery Hernia Surgery  Family History: Reviewed history from 03/14/2008 and no changes required. Patient adopted  No known family history  Social History: Reviewed history from 10/23/2008 and no changes required. Occupation: Self-Employed-Recruiter Married Patient has 2 children Current Smoker- 1 1/2 Packs per day Alcohol use-yes-0 -recently quit  Drug use-no Regular exercise-no  Review of Systems       The patient complains of allergy/sinus.  The patient denies anemia, anxiety-new, arthritis/joint pain, back pain, blood in urine, breast changes/lumps, change in vision, confusion, cough, coughing up blood, depression-new, fainting, fatigue, fever, headaches-new, hearing problems, heart murmur, heart rhythm changes, itching, menstrual pain, muscle pains/cramps,  night sweats, nosebleeds, pregnancy symptoms, shortness of breath, skin rash, sleeping problems, sore throat, swelling of feet/legs, swollen lymph glands, thirst - excessive ,  urination - excessive , urination changes/pain, urine leakage, vision changes, and voice change.         Pertinent positive and negative review of systems were noted in the above HPI. All other ROS was otherwise negative.   Vital Signs:  Patient profile:   50 year old female Height:      69 inches Weight:      197 pounds BMI:     29.20 BSA:     2.05 Pulse rate:   88 / minute Pulse rhythm:   regular BP sitting:   98 / 60  (left arm) Cuff size:   regular  Vitals Entered By: Ok Anis CMA (August 12, 2010 8:35 AM)  Physical Exam  General:  Well developed, well nourished, no acute distress. Eyes:  nonicteric. Neck:  Supple; no masses or thyromegaly. Lungs:  Clear throughout to auscultation. Heart:  Regular rate and rhythm; no murmurs, rubs,  or bruits. Abdomen:  soft relaxed abdomen with well-healed surgical scar. Nontender. A large liver 3-4 cm below right costal margin which is firm but nontender. Splenic tip not palpable. I cannot appreciate any ascites. Neurologic:  no asterixis.   Impression & Recommendations:  Problem # 1:  CIRRHOSIS-ALCOHOLIC (ICD-571.2) Patient has stable compensated cirrhosis. She has been abstinent from alcohol for 2 years. She is doing very well. She may reduce diuretics to Aldactone 50 mg every other day and Lasix 40 mg every other day. I will see her in 4 months.  Patient Instructions: 1)  Lasix 40 mg every other day. 2)  Aldactone 50 mg every other day. 3)  Continue low-salt diet. 4)  Office visit 4 months. 5)  Will need labs just before you come to the office. 6)  Copy sent to : [facility]

## 2010-11-05 NOTE — Letter (Signed)
Summary: From pt/[facility]  From pt/[facility]   Imported By: Lester  11/19/2009 10:04:45  _____________________________________________________________________  External Attachment:    Type:   Image     Comment:   External Document

## 2010-11-05 NOTE — Miscellaneous (Signed)
Summary: Lactulose Rx  Patient's lactulose prescription was changed to 20 ml daily as of 01/08/10 (see labs)....will send new prescription. Dottie Nelson-Smith CMA Duncan Dull)  January 29, 2010 12:10 PM  Clinical Lists Changes  Medications: Changed medication from LACTULOSE 10 GM/15ML SOLN (LACTULOSE) Take 1 tablespoon (15 ml) by mouth once daily to LACTULOSE 10 GM/15ML SOLN (LACTULOSE) Take 20 cc by mouth once daily - Signed Rx of LACTULOSE 10 GM/15ML SOLN (LACTULOSE) Take 20 cc by mouth once daily;  #600 ml x 1;  Signed;  Entered by: Lamona Curl CMA (AAMA);  Authorized by: Hart Carwin MD;  Method used: Electronically to [facility]. #[id number]*, [address] [place], [place], [place]  [address], Ph: [phone], Fax: [phone]    Prescriptions: LACTULOSE 10 GM/15ML SOLN (LACTULOSE) Take 20 cc by mouth once daily  #600 ml x 1   Entered by:   Lamona Curl CMA (AAMA)   Authorized by:   Hart Carwin MD   Signed by:   Lamona Curl CMA (AAMA) on 01/29/2010   Method used:   Electronically to        [facility]. [phone]* (retail)       [phone]       [place], [place]  [address]       Ph: [phone]       Fax: [phone]   RxID:   [id number]

## 2010-11-05 NOTE — Progress Notes (Signed)
Summary: TRIAGE--labs?  Phone Note Call from Patient Call back at [facility] Phone [phone]   Caller: Patient Call For: Dr. Juanda Chance Reason for Call: Talk to Nurse Summary of Call: does pt need to sch labwork? Initial call taken by: Vallarie Mare,  March 08, 2010 10:36 AM  Follow-up for Phone Call        Pt. had her Umbilical Hernia repaired on 02-06-10. She has a follow-up appt. to see Dr.Tosha Belgarde on 03-20-10.   DR.Giovonni Poirier--Does she need any labs before her OV?  Follow-up by: Laureen Ochs LPN,  March 08, 4781 11:40 AM  Additional Follow-up for Phone Call Additional follow up Details #1::        Yes, she does, Please draw c-met, CBC ammonia Protime next week. Additional Follow-up by: Hart Carwin MD,  March 08, 2010 2:32 PM    Additional Follow-up for Phone Call Additional follow up Details #2::    Message left for pt. with above MD instructions. Lab orders are in IDX. Pt. to keep scheduled office visit. Pt. instructed to call back as needed.  Follow-up by: Laureen Ochs LPN,  March 08, 9561 3:44 PM

## 2010-11-05 NOTE — Assessment & Plan Note (Signed)
Summary: F/U APPT...LSW.   History of Present Illness Visit Type: Follow-up Visit Primary GI MD: Lina Sar MD Primary Provider: [facility] Requesting Provider: n/a Chief Complaint: follow-up History of Present Illness:   This is a 50 year old white female with advanced Laennec's cirrhosis,  Her liver function tests have steadily has improved. She had repair of a ventral hernia by Dr. Davina Poke on 02/06/10. Her last alcohol intake was in 09/17/08. She is on Lasix, Zaroxolyn and Aldactone. Her last CMP was essentially normal. She comes today for a routine follow up of her condition and she has no GI complaints today. Patient has gained about 6 pounds but denies any fluid retention. Her physical strength has been satisfactory and her mental status has been satisfactory as well. We were trying to reduce her diuretics but she noticed increased fluid retention and went back on Zaroxolyn and Lasix.   GI Review of Systems      Denies abdominal pain, acid reflux, belching, bloating, chest pain, dysphagia with liquids, dysphagia with solids, heartburn, loss of appetite, nausea, vomiting, vomiting blood, weight loss, and  weight gain.        Denies anal fissure, black tarry stools, change in bowel habit, constipation, diarrhea, diverticulosis, fecal incontinence, heme positive stool, hemorrhoids, irritable bowel syndrome, jaundice, light color stool, liver problems, rectal bleeding, and  rectal pain.    Current Medications (verified): 1)  Lidoderm 5 % Ptch (Lidocaine) .... As Needed 2)  Aldactone 50 Mg Tabs (Spironolactone) .... Take 2 Tablet By Mouth Once A Day 3)  Vicodin 5-500 Mg Tabs (Hydrocodone-Acetaminophen) .... Take 1 Tablet By Mouth Every 12 Hours As Needed For Pain. 4)  Abdominal Binder .... Size To Be Fitted. Use For Umbilical Hernia Treatment. 5)  Furosemide 40 Mg Tabs (Furosemide) .... Take 2 Tablets By Mouth Once Daily 6)  Dialyvite 800/ultra D  Tabs (Multiple  Vitamins-Minerals) .... One Tablet By Mouth Once Daily 7)  Multivitamins   Tabs (Multiple Vitamin) .... One Tablet By Mouth Once Daily 8)  Klor-Con M20 20 Meq Cr-Tabs (Potassium Chloride Crys Cr) .... Take 2 Tablet By Mouth Once A Day 9)  Zaroxolyn 2.5 Mg Tabs (Metolazone) .... Take One By Mouth Every Day. 10)  Gas-X 80 Mg Chew (Simethicone) .... As Needed 11)  Zyrtec Allergy 10 Mg Caps (Cetirizine Hcl) .... As Needed 12)  Lactulose 10 Gm/41ml Soln (Lactulose) .... Take 20 Cc By Mouth Once Daily 13)  Tylenol Extra Strength 500 Mg Tabs (Acetaminophen) .... Take As Needed  Allergies (verified): 1)  ! Vancomycin  Past History:  Past Medical History: Reviewed history from 01/22/2009 and no changes required. 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 bilateral Heel Spur-foot surgery Caesarean section Knee x4 - Maltracting Patella (1x on right, 3x on left) Cone biopsy-cervix Lt ankle surgery Hernia Surgery  Family History: Reviewed history from 03/14/2008 and no changes required. Patient adopted  No known family history  Social History: Reviewed history  from 10/23/2008 and no changes required.  Occupation: Self-Employed-Recruiter Married Patient has 2 children Current Smoker- 1 1/2 Packs per day Alcohol use-yes-0 -recently quit  Drug use-no Regular exercise-no  Review of Systems       The patient complains of arthritis/joint pain, back pain, and menstrual pain.  The patient denies allergy/sinus, anemia, anxiety-new, blood in urine, breast changes/lumps, change in vision, confusion, cough, coughing up blood, depression-new, fainting, fatigue, fever, headaches-new, hearing problems, heart murmur, heart rhythm changes, itching, muscle pains/cramps, night sweats, nosebleeds, pregnancy symptoms, shortness of breath, skin rash, sleeping problems, sore throat, swelling of feet/legs, swollen lymph glands, thirst - excessive , urination - excessive , urination changes/pain, urine leakage, vision changes, and voice change.         Pertinent positive and negative review of systems were noted in the above HPI. All other ROS was otherwise negative.   Vital Signs:  Patient profile:   50 year old female Height:      69 inches Weight:      180 pounds BMI:     26.68 Pulse rate:   88 / minute Pulse rhythm:   regular BP sitting:   92 / 60  (left arm)  Vitals Entered By: Milford Cage NCMA (May 15, 2010 8:49 AM)  Physical Exam  General:  Well developed, well nourished, no acute distress. Eyes:  PERRLA, no icterus. Neck:  Supple; no masses or thyromegaly. Lungs:  Clear throughout to auscultation. Heart:  Regular rate and rhythm; no murmurs, rubs,  or bruits. Abdomen:  soft abdomen with large liver extending at least 5-6 cm below right costal margin and to the left upper quadrant. There is no ascites. Well-healed herniorrhaphy scar at the umbilicus. No recurrence of umbilical hernia. No fluid wave. Extremities:  no edema Neurologic:  no asterixis. Patient is alert and oriented Skin:  spider nevi present. Psych:  Alert and cooperative. Normal mood  and affect.   Impression & Recommendations:  Problem # 1:  CIRRHOSIS-ALCOHOLIC (ICD-571.2) Patient has compensated Laennec's  cirrhosis with no evidence of ascites. There has been normalization of the liver function tests. Patient has done extremely well over the past 2 years since she stopped drinking. We will again try to gradually reduce her diuretic regimen. First, we will stop the Zaroxolyn. She will continue on Lasix 80 mg a day and Aldactone 100 mg a day. In about a month, she will cut back her Aldactone to 50 mg a day. I will see her in 3 months. She will have a renal profile and liver function test at the end of September 2011.  Patient Instructions: 1)  Stop Zaroxolyn 2.5 mg daily 2)  Continue Aldactone 100 mg a day for 4-6 weeks then reduce to 50 mg daily. 3)  Continue Lasix 80 mg daily. 4)  Increase physical activity to prevent further weight gain which made adversely affect the ventral hernia. 5)  Metabolic panel at the end of September 2011, she will call us before she comes. 6)  Continue lactulose 15 cc p.r.n. 7)  Copy sent to : [facility] 8)  The medication list was reviewed and reconciled.  All changed / newly prescribed medications were explained.  A complete medication list was provided to the patient / caregiver.

## 2010-11-05 NOTE — Assessment & Plan Note (Signed)
Summary: 3 MONTH F/U                 [facility]   History of Present Illness Visit Type: Follow-up Visit Primary GI MD: Lina Sar MD Primary Provider: n/a Requesting Provider: n/a Chief Complaint: F/u for cirrhosis History of Present Illness:   This is a 50 year old white female with advanced Laennec's cirrhosis, abstinent for over a year. Her liver disease has improved. We have been working on her ascites. She has been seen in preparation for repair of a large umbilical hernia. She saw Dr Davina Poke and subsequently Dr Lorin Picket at [facility] for a second opinion regarding repair of umbilical hernia in the setting of severe liver disease and ascites. The opinion was that if she can diurese her ascites, they would be willing to reduce the hernia. We have now been increasing her diuretics gradually to Lasix 80 mg daily, Zaroxolyn 2.5 mg daily, and Aldactone 100 mg daily. Her weight has remained around 168-169 pounds. Her venous ammonia level as well as are her electrolytes have remained stable. Her renal function has remained normal as well.   GI Review of Systems      Denies abdominal pain, acid reflux, belching, bloating, chest pain, dysphagia with liquids, dysphagia with solids, heartburn, loss of appetite, nausea, vomiting, vomiting blood, weight loss, and  weight gain.      Reports liver problems.     Denies anal fissure, black tarry stools, change in bowel habit, constipation, diarrhea, diverticulosis, fecal incontinence, heme positive stool, hemorrhoids, irritable bowel syndrome, jaundice, light color stool, rectal bleeding, and  rectal pain.    Current Medications (verified): 1)  Lidoderm 5 % Ptch (Lidocaine) .... As Needed 2)  Aldactone 50 Mg Tabs (Spironolactone) .... Take 2 Tablet By Mouth Once A Day 3)  Vicodin 5-500 Mg Tabs (Hydrocodone-Acetaminophen) .... Take 1 Tablet By Mouth Every 12 Hours As Needed For Pain. 4)  Abdominal Binder .... Size To Be Fitted. Use For Umbilical  Hernia Treatment. 5)  Furosemide 40 Mg Tabs (Furosemide) .... Take 2 Tablets By Mouth Once Daily 6)  Dialyvite 800/ultra D  Tabs (Multiple Vitamins-Minerals) .... One Tablet By Mouth Once Daily 7)  Multivitamins   Tabs (Multiple Vitamin) .... One Tablet By Mouth Once Daily 8)  Tylenol 325 Mg Tabs (Acetaminophen) .... Two Tablets By Mouth Daily 9)  Klor-Con M20 20 Meq Cr-Tabs (Potassium Chloride Crys Cr) .... Take 2 Tablet By Mouth Once A Day 10)  Zaroxolyn 2.5 Mg Tabs (Metolazone) .... Take One By Mouth Every Day. 11)  Gas-X 80 Mg Chew (Simethicone) .... As Needed 12)  Ra P-Col Rite 8.6-50 Mg Tabs (Sennosides-Docusate Sodium) .... One Tablet By Mouth Two Times A Day 13)  Zyrtec Allergy 10 Mg Caps (Cetirizine Hcl) .... As Needed  Allergies (verified): 1)  ! Vancomycin  Past History:  Past Medical History: Reviewed history from 01/22/2009 and no changes required. 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 bilateral Heel Spur-foot surgery Caesarean section Knee  x4 - Maltracting Patella (1x on right, 3x on left) Cone biopsy-cervix Lt ankle surgery  Family  History: Reviewed history from 03/14/2008 and no changes required. Patient adopted  No known family history  Social History: Reviewed history from 10/23/2008 and no changes required. Occupation: Self-Employed-Recruiter Married Patient has 2 children Current Smoker- 1 1/2 Packs per day Alcohol use-yes-0 -recently quit  Drug use-no Regular exercise-no  Review of Systems  The patient denies allergy/sinus, anemia, anxiety-new, arthritis/joint pain, back pain, blood in urine, breast changes/lumps, change in vision, confusion, cough, coughing up blood, depression-new, fainting, fatigue, fever, headaches-new, hearing problems, heart murmur, heart rhythm changes, itching, menstrual pain, muscle pains/cramps, night sweats, nosebleeds, pregnancy symptoms, shortness of breath, skin rash, sleeping problems, sore throat, swollen lymph glands, thirst - excessive , urination - excessive , urination changes/pain, urine leakage, vision changes, and voice change.         Pertinent positive and negative review of systems were noted in the above HPI. All other ROS was otherwise negative.   Vital Signs:  Patient profile:   50 year old female Height:      69 inches Weight:      169 pounds BMI:     25.05 BSA:     1.92 Pulse rate:   62 / minute Pulse rhythm:   regular BP sitting:   100 / 64  (right arm) Cuff size:   regular  Vitals Entered By: Ok Anis CMA (December 17, 2009 9:45 AM)  Physical Exam  General:  Well developed, well nourished, no acute distress. Eyes:  nonicteric. Lungs:  Clear throughout to auscultation. Heart:  Regular rate and rhythm; no murmurs, rubs,  or bruits. Abdomen:  in standing position, a large umbilical hernia protruding at least grapefruit size. Somewhat tender but no evidence of inflammation. Bowel sounds are active. In lying down position, hernia spontaneously reduces. There is no ascites by my exam. Liver edge is firm and nontender several centimeters below right  costal margin. Splenic tip palpable left upper quadrant. Extremities:  no edema. Neurologic:  no asterixis. Skin:  extensive spider nevi.   Impression & Recommendations:  Problem # 1:  ASCITES (ICD-789.59)  Patient has resolving ascites. We will obtain an upper abdominal ultrasound. I think patient is ready for surgery on her umbilical hernia. She will make an appointment with Dr.Cornett.  Orders: Ultrasound Abdomen (UAS)  Problem # 2:  CIRRHOSIS-ALCOHOLIC (ICD-571.2) Patient has portal hypertension. She has not had any alcohol in over a year. She is doing very well considering the severity of her liver disease. We will continue to diurese her and follow her ammonia and electrolytes. We will also start her on lactulose 15 cc daily for constipation.  Patient Instructions: 1)  continue Zaroxolyn, Lasix and Aldactone. 2)  Renal profile, CBC, prothrombin time and venous ammonia weekly. 3)  See Dr.Cornett for consideration of surgery. 4)  lactulose 15-30 cc daily. 5)  I would like her to have the surgery during the week that I have hospital duty, so I could see her in consultation. 6)  Copy sent to : Dr Warren Lacy  7)  The medication list was reviewed and reconciled.  All changed / newly prescribed medications were explained.  A complete medication list was provided to the patient / caregiver. Prescriptions: LACTULOSE 10 GM/15ML SOLN (LACTULOSE) Take 1 tablespoon (15 ml) by mouth once daily  #12 ounces x 3   Entered by:   Hortense Ramal CMA (AAMA)   Authorized by:   Hedwig Morton  Juanda Chance MD   Signed by:   Hortense Ramal CMA (AAMA) on 12/17/2009   Method used:   Electronically to        [facility]. [phone]* (retail)       [phone].       [place], [place]  [address]       Ph: [phone]       Fax: [phone]   RxID:   [id number]

## 2010-11-05 NOTE — Letter (Signed)
[facility]   Imported By: Sherian Rein 07/31/2010 08:25:45  _____________________________________________________________________  External Attachment:    Type:   Image     Comment:   External Document

## 2010-11-05 NOTE — Progress Notes (Signed)
Summary: TRIAGE  Phone Note Call from Patient Call back at Home Phone [phone]   Call For: Dr Juanda Chance Summary of Call: Wants 2nd opinion with a surgeon. Initial call taken by: Leanor Kail [facility],  October 08, 2009 11:50 AM  Follow-up for Phone Call        Pt. saw Dr.Cornett about an Umbilical Hernia repair. Pt. states this will be a very difficult surgery and would like Dr.Seth Higginbotham to recommend someone for her to see for a 2nd opinion. States it can be at [facility] or anywhere.  DR.Chinita Schimpf PLEASE ADVISE  Follow-up by: Laureen Ochs LPN,  October 08, 2009 12:26 PM  Additional Follow-up for Phone Call Additional follow up Details #1::        Please send to Dr Marilynn Rail at [facility]. Additional Follow-up by: Hart Carwin MD,  October 08, 2009 1:45 PM    Additional Follow-up for Phone Call Additional follow up Details #2::    Above MD orders reviewed with patient. I will call her with appt. information. (She cannot see him 10-22-09)Deborah Fleet Contras LPN  October 08, 2009 4:39 PM    DR.Eric Nees--Dr.Howerton only does pancreatic cancer surgery now, his office recommended Dr.Wescott, Dr.Mcnatt or Dr.Fernandez, who do you prefer? Follow-up by: Laureen Ochs LPN,  October 09, 2009 10:07 AM  Additional Follow-up for Phone Call Additional follow up Details #3:: Details for Additional Follow-up Action Taken: Dr Carolynn Sayers Additional Follow-up by: Hart Carwin MD,  October 09, 2009 12:25 PM   Pt. has an appt. with Dr.Wescott on 11-02-09 at 2:30pm. Message left for patient to callback. Laureen Ochs LPN  October 09, 2009 12:43 PM  Pt. aware of above appt. date/time. Pt. instructed to call back as needed.Laureen Ochs LPN  October 09, 2009 2:30 PM

## 2010-11-05 NOTE — Letter (Signed)
Summary: [facility]   Imported By: Sherian Rein 01/31/2010 13:09:56  _____________________________________________________________________  External Attachment:    Type:   Image     Comment:   External Document

## 2010-11-05 NOTE — Letter (Signed)
Summary: [facility]  [address] [place], [place] [address]   Phone: [phone]  Fax: [phone]             Feb 25, 2010 MRN: [id number]    RE:     ASHYA NICOLAISEN         [address]         [place], [place]  [address]  JUROR # [phone] PANEL # [phone]    To Whom It May Concern,  Mrs. Schaad is a patient who has been under my care for several years for a medical condition involving the liver. Mrs. Steines takes several medications to stabalize her liver condition. Unfortunately, this medication can cause diarrhea and therefore render her unable to sit for extended periods of time without access to a restroom.   Due to these circumstances, I feel that it would be best that Mrs. Sivertsen be excused for jury duty at this time. I appreciate your consideration of this case and will be available at my office number, [phone].  Sincerely,     Hedwig Morton. Juanda Chance, MD

## 2010-11-07 NOTE — Progress Notes (Signed)
Summary: [facility]  Phone Note Call from Patient Call back at [facility] Phone [phone]   Caller: Patient Call For: Dr. Juanda Chance Reason for Call: Talk to Nurse Summary of Call: Pt made appt for 2/15 and wants to know if she needs to have labs before her appt Initial call taken by: [place] Johnson,  October 22, 2010 9:29 AM  Follow-up for Phone Call        Dr Juanda Chance- Would you like for patient to have labs completed before appointment and if so, which ones? Follow-up by: Lamona Curl CMA Duncan Dull),  October 22, 2010 10:38 AM  Additional Follow-up for Phone Call Additional follow up Details #1::        yes, please, draw C-met, CBC, ammonia Additional Follow-up by: Hart Carwin MD,  October 22, 2010 12:40 PM     Appended Document: [facility] entered in [facility]. Patient states that she will get labs drawn before her visit on 11/20/10.

## 2010-11-12 ENCOUNTER — Encounter: Payer: Self-pay | Admitting: Internal Medicine

## 2010-11-20 ENCOUNTER — Other Ambulatory Visit: Payer: Self-pay

## 2010-11-20 ENCOUNTER — Ambulatory Visit (INDEPENDENT_AMBULATORY_CARE_PROVIDER_SITE_OTHER): Payer: BC Managed Care – PPO | Admitting: Internal Medicine

## 2010-11-20 ENCOUNTER — Encounter: Payer: Self-pay | Admitting: Internal Medicine

## 2010-11-20 DIAGNOSIS — K7030 Alcoholic cirrhosis of liver without ascites: Secondary | ICD-10-CM

## 2010-11-27 NOTE — Assessment & Plan Note (Signed)
Summary: follow up   History of Present Illness Visit Type: Follow-up Visit Primary GI MD: Lina Sar MD Primary Provider: [facility] Requesting Provider: n/a Chief Complaint: F/u for [name] History of Present Illness:   This is a 50 year old white female with Tracy [name]. Her last alcohol intake was in December 2009. Her last office visit was 08/12/10. She has been compensated now for at least a year. Her liver function tests have been normal including the prothrombin time, venous ammonia ,albumin and electrolytes. Her main problem seems to be peripheral neuropathy and decreased equilibrium. She denies abdominal pain or change in bowel habits.   GI Review of Systems      Denies abdominal pain, acid reflux, belching, bloating, chest pain, dysphagia with liquids, dysphagia with solids, heartburn, loss of appetite, nausea, vomiting, vomiting blood, weight loss, and  weight gain.      Reports liver problems.     Denies anal fissure, black tarry stools, change in bowel habit, constipation, diarrhea, diverticulosis, fecal incontinence, heme positive stool, hemorrhoids, irritable bowel syndrome, jaundice, light color stool, rectal bleeding, and  rectal pain.    Current Medications (verified): 1)  Lidoderm 5 % Ptch (Lidocaine) .... As Needed 2)  Vicodin 5-500 Mg Tabs (Hydrocodone-Acetaminophen) .... Take 1 Tablet By Mouth Every 12 Hours As Needed For Pain. 3)  Furosemide 40 Mg Tabs (Furosemide) .... Take 1 Tablet As Needed 4)  Multivitamins   Tabs (Multiple Vitamin) .... One Tablet By Mouth Once Daily 5)  Gas-X 80 Mg Chew (Simethicone) .... As Needed 6)  Zyrtec Allergy 10 Mg Caps (Cetirizine Hcl) .... As Needed 7)  Tylenol Extra Strength 500 Mg Tabs (Acetaminophen) .... Take As Needed 8)  Gabapentin 400 Mg Caps (Gabapentin) .... 4 Capsule By Mouth Once Daily 9)  Vitamin D (Ergocalciferol) 50000 Unit Caps (Ergocalciferol) .... Once A Week By Mouth 10)  Melatonin 5  Mg Tabs (Melatonin) .... One Tablet By Mouth At Bedtime 11)  Acetaminophen 500 Mg Caps (Acetaminophen) .... Two Capsules By Mouth At Bedtime  Allergies (verified): 1)  ! 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 bilateral Heel Spur-foot surgery Caesarean section Knee x4 - Maltracting Patella (1x on right, 3x on left) Cone biopsy-cervix Lt ankle surgery Hernia Surgery  Family History: Reviewed history from 03/14/2008 and no changes required. Patient adopted  No known family history  Social History: Reviewed history from 10/23/2008 and no changes required. Occupation: Self-Employed-Recruiter Married Patient has 2 children Current Smoker- 1 1/2 Packs per day Alcohol use-yes-0 -recently quit  Drug use-no Regular exercise-no  Review of Systems  The patient denies allergy/sinus, anemia, anxiety-new, arthritis/joint pain, back pain, blood in urine, breast changes/lumps, change in vision, confusion, cough, coughing up blood, depression-new, fainting, fatigue, fever, headaches-new,  hearing problems, heart murmur, heart rhythm changes, itching, menstrual pain, muscle pains/cramps, night sweats, nosebleeds, pregnancy symptoms, shortness of breath, skin rash, sleeping problems, sore throat, swelling of feet/legs, swollen lymph  glands, thirst - excessive , urination - excessive , urination changes/pain, urine leakage, vision changes, and voice change.         Pertinent positive and negative review of systems were noted in the above HPI. All other ROS was otherwise negative.   Vital Signs:  Patient profile:   50 year old female Height:      69 inches Weight:      205 pounds BMI:     30.38 BSA:     2.09 Pulse rate:   88 / minute Pulse rhythm:   regular BP sitting:   98 / 64  (left arm) Cuff size:   regular  Vitals Entered By: Ok Anis CMA (November 20, 2010 8:33 AM)  Physical Exam  General:  alert, oriented and in no distress. Stigmata of chronic liver disease. Eyes:  nonicteric. Mouth:  normal oral mucosa. Neck:  Supple; no masses or thyromegaly. Lungs:  Clear throughout to auscultation. Heart:  Regular rate and rhythm; no murmurs, rubs,  or bruits. Abdomen:  protuberant abdomen. Well healed post ventral hernia repair one year ago. No ascites. Large liver 5-7 cm below right costal margin. Extremities:  no edema Neurologic:  no asterixis Skin:  palmar erythema   Impression & Recommendations:  Problem # 1:  ASCITES (ICD-789.59) resolved. She has been off  diuretics.  Problem # 2:  [name]-ALCOHOLIC (ICD-571.2) Patient has compensated [name]. She has had no alcohol intake  since 2009. Her diease is complicated by peripheral neuropathy and cerebellar dysfunction. She will see Dr.Yan for her neuropathy and balance problems.  Patient Instructions: 1)  Office visit in 6 months.  2)  Follow up with neurology for polyneuropathy and balance problems. 3)  We will give you one last refill for Vicodin and we will then have Dr. Terrace Arabia to take over her pain control  from neuropathy. 4)  She will need blood tests prior to her next appointment. 5)  CC: Dr Terrace Arabia 6)  The medication list was reviewed and reconciled.  All changed / newly prescribed medications were explained.  A complete medication list was provided to the patient / caregiver.

## 2010-11-27 NOTE — Letter (Signed)
Summary: [facility]  [facility]   Imported By: Lamona Curl CMA (AAMA) 11/19/2010 09:22:17  _____________________________________________________________________  External Attachment:    Type:   Image     Comment:   External Document  Appended Document: [facility] normal results reviewed with the pt.

## 2010-12-24 LAB — COMPREHENSIVE METABOLIC PANEL
ALT: 25 U/L (ref 0–35)
ALT: 35 U/L (ref 0–35)
AST: 39 U/L — ABNORMAL HIGH (ref 0–37)
AST: 51 U/L — ABNORMAL HIGH (ref 0–37)
Albumin: 3.3 g/dL — ABNORMAL LOW (ref 3.5–5.2)
Albumin: 3.9 g/dL (ref 3.5–5.2)
Alkaline Phosphatase: 74 U/L (ref 39–117)
Calcium: 9.8 mg/dL (ref 8.4–10.5)
GFR calc Af Amer: 50 mL/min — ABNORMAL LOW (ref >60)
GFR calc Af Amer: >60 mL/min (ref >60)
Glucose, Bld: 116 mg/dL — ABNORMAL HIGH (ref 70–99)
Glucose, Bld: 124 mg/dL — ABNORMAL HIGH (ref 70–99)
Potassium: 4.5 mEq/L (ref 3.5–5.1)
Potassium: 4 mEq/L (ref 3.5–5.1)
Sodium: 130 mEq/L — ABNORMAL LOW (ref 135–145)
Sodium: 134 mEq/L — ABNORMAL LOW (ref 135–145)
Total Protein: 7.2 g/dL (ref 6.0–8.3)
Total Protein: 8.4 g/dL — ABNORMAL HIGH (ref 6.0–8.3)

## 2010-12-24 LAB — DIFFERENTIAL
Eosinophils Relative: 1 % (ref 0–5)
Lymphocytes Relative: 31 % (ref 12–46)
Lymphs Abs: 2.4 10*3/uL (ref 0.7–4.0)
Monocytes Relative: 8 % (ref 3–12)

## 2010-12-24 LAB — BILIRUBIN, DIRECT: Bilirubin, Direct: 0.3 mg/dL (ref 0.0–0.3)

## 2010-12-24 LAB — AMMONIA: Ammonia: 15 umol/L (ref 11–35)

## 2010-12-24 LAB — CBC
Hemoglobin: 12.7 g/dL (ref 12.0–15.0)
MCHC: 34.4 g/dL (ref 30.0–36.0)
Platelets: 155 10*3/uL (ref 150–400)
RDW: 15.9 % — ABNORMAL HIGH (ref 11.5–15.5)
RDW: 16.1 % — ABNORMAL HIGH (ref 11.5–15.5)

## 2011-01-16 LAB — COMPREHENSIVE METABOLIC PANEL
Albumin: 3.2 g/dL — ABNORMAL LOW (ref 3.5–5.2)
BUN: 7 mg/dL (ref 6–23)
CO2: 24 mEq/L (ref 19–32)
Chloride: 102 mEq/L (ref 96–112)
Creatinine, Ser: 0.53 mg/dL (ref 0.4–1.2)
GFR calc non Af Amer: >60 mL/min (ref >60)
Total Bilirubin: 2.2 mg/dL — ABNORMAL HIGH (ref 0.3–1.2)

## 2011-01-16 LAB — CBC
HCT: 26.6 % — ABNORMAL LOW (ref 36.0–46.0)
HCT: 32.9 % — ABNORMAL LOW (ref 36.0–46.0)
Hemoglobin: 8.9 g/dL — ABNORMAL LOW (ref 12.0–15.0)
MCHC: 32.8 g/dL (ref 30.0–36.0)
MCV: 88.7 fL (ref 78.0–100.0)
Platelets: 218 10*3/uL (ref 150–400)
WBC: 10.4 10*3/uL (ref 4.0–10.5)
WBC: 9.9 10*3/uL (ref 4.0–10.5)

## 2011-01-16 LAB — DIFFERENTIAL
Basophils Absolute: 0.1 10*3/uL (ref 0.0–0.1)
Lymphocytes Relative: 23 % (ref 12–46)
Neutro Abs: 6.7 10*3/uL (ref 1.7–7.7)

## 2011-02-18 NOTE — H&P (Signed)
[name], [name]                ACCOUNT NO.:  [id number]   MEDICAL RECORD NO.:  [id number]          PATIENT TYPE:  INP   LOCATION:  1426                         FACILITY[facility]   PHYSICIAN:  Iva Boop, MD,FACGDATE OF BIRTH:  [birth year]   DATE OF ADMISSION:  10/04/2008  DATE OF DISCHARGE:                              HISTORY & PHYSICAL   PROBLEM:  Severe hyponatremia.   HISTORY:  [name] is a pleasant 50 year old white female, known to Dr.  Lina Sar and primary patient of Dr. Doristine Counter, who has history of  alcoholic liver disease.  She was seen in the office on October 03, 2008 at that time in referral with complaints of new development of  abdominal pain and distention which had occurred over the past few  weeks.  She previously had not had any difficulty with ascites.  She was  noted to be jaundiced with massive ascites and no peripheral edema.  Labs were done in the office on December 29 revealing a sodium of 122  potassium was 4.3, chloride 86, BUN of 7, creatinine 0.5, total  bilirubin 5.5, alk phos 140, SGOT of 88 and SGPT of 16.  Albumin was  2.2.  She was scheduled for a large volume paracentesis which was done  today on the 30th, and we planned follow-up labs as well.  She has  tolerated the paracentesis with removal of 6.2 L.  Repeat labs revealed  a sodium of 117, potassium 3.9, chloride 85, creatinine 0.58, total  bilirubin of 5.9.  Calculating her discriminant MELD discriminant  function score equals 24.  She is at this time admitted to the hospital  for on medical management of her hyponatremia which is secondary to  advanced alcoholic liver disease.   PAST MEDICAL HISTORY:  Pertinent for GERD, recurrent urinary tract  infections, osteoarthritis.  She is status post C-section.  She did have  upper endoscopy with Dr. Lina Sar in June 2009 for GERD. Did not have  any evidence of varices at that time. She did have changes in her antrum  consistent with  portal hypertension.   The patient had recently been seen by Dr. Lucie Leather office on a couple  of occasions. Initially had developed diarrhea earlier in December and  was found to be hypokalemia. This has been replaced orally with  potassium.  She and her husband report that she had onset of jaundice  within the past couple of weeks as well as onset of the increase in  abdominal girth.  Her husband reports that she has been weak and  fatigued but has been mentating normally.  For further details please  see the dictated office note dated October 03, 2008.   CURRENT MEDICATIONS:  Lidoderm patch as needed, Aldactone 25 mg p.o.  daily, and Prilosec 20 mg daily, Imodium p.r.n.   ALLERGIES:  VANCOMYCIN.   FAMILY HISTORY:  The patient is adopted.  Family history unknown.   SOCIAL HISTORY:  She is married, has two children.  She is employed.  She is a smoker of one and a half packs per day. Had  been drinking  alcohol daily for 20+ years and stopped approximately 14 days ago.   REVIEW OF SYSTEMS:  HEENT: Unremarkable.  CARDIOVASCULAR:  Denies any  chest pain or anginal symptoms.  PULMONARY:  Negative for cough,  shortness of breath, or sputum production.  GENITOURINARY:  Urine has  been dark.  She also has had a recent UTI being treated with Cipro which  she has just completed. GI: As above.  MUSCULOSKELETAL:  Pertinent for  some arthritic symptoms.  SKIN:  Jaundiced gradual onset x2 weeks.  NEURO/PSYCH:  Negative.  All other review of systems negative or as an  described in the HPI.   PHYSICAL EXAM:  GENERAL APPEARANCE:  Well-developed, jaundiced white  female in no acute distress.  She is alert and oriented x3.  VITAL SIGNS:  Temperature is 97.2, blood pressure 122/77, pulse 101,  sats 100% on room air.  HEENT: Nontraumatic, normocephalic.  EOMI, PERRLA.  Sclerae are icteric.  Neck is supple.  There is no JVD.  LUNGS:  Clear to A and P.  CARDIOVASCULAR:  Tachy regular rhythm with  S1 and S2.  ABDOMEN: Protuberant and large with nontense ascites.  She is tender  over her umbilicus with bluish discoloration consistent with caput  medusae. There is no palpable mass.  EXTREMITIES:  Without clubbing, cyanosis or edema.  She does have palmar  erythema.  NEURO:  The patient is alert and oriented x3.  Exam is grossly nonfocal.  There is no asterixis.   IMPRESSION:  1. A 50 year old white female with alcohol-induced cirrhosis, now      decompensating with recent development of jaundice, ascites and      progressive hyponatremia.  2. Gastroesophageal reflux disease.  3. Recent hypokalemia, corrected  4. Macrocytic anemia secondary to above.   1. Previously documented portal gastropathy.   PLAN:  The patient is admitted to the service of Dr. Stan Head for 24-  hour observation.  Will start normal saline, place her on fluid  restriction to 1000 mL per 24 hours, hold her diuretics, continue 2  grams sodium diet, replace thiamine and have discussed management of the  hyponatremia with Dr. Eliott Nine for renal and renal will see in  consultation if the sodium does not improve quickly with the above  measures. For details please see the orders.      Amy Esterwood, PA-C      Iva Boop, MD,FACG  Electronically Signed    AE/MEDQ  D:  10/05/2008  T:  10/05/2008  Job:  [id number]   cc:   Iva Boop, MD,FACG  [facility]  [address] [place], [place] [address]

## 2011-02-18 NOTE — Discharge Summary (Signed)
[name], [name]                ACCOUNT NO.:  [id number]   MEDICAL RECORD NO.:  [id number]          PATIENT TYPE:  INP   LOCATION:  9303                          FACILITY:  [facility]   PHYSICIAN:  M. Leda Quail, MD  DATE OF BIRTH:  December 08, 1960   DATE OF ADMISSION:  12/14/2008  DATE OF DISCHARGE:  12/16/2008                               DISCHARGE SUMMARY   ADMISSION DIAGNOSIS:  A 50 year old G2 P2 married white female with:  1. Very large left vulvar abscess.  2. History of Laennec's cirrhosis secondary to alcohol abuse.  3. Hepatomegaly  4. Splenomegaly.  5. Known umbilical hernia.  6. Remote history of methicillin-resistant Staphylococcus aureus in      her knee after arthroscopy.  7. Elevated lipids.   DISCHARGE DIAGNOSIS:  1. Very large left vulvar abscess.  2. History of Laennec's cirrhosis secondary to alcohol abuse.  3. Hepatomegaly  4. Splenomegaly.  5. Known umbilical hernia.  6. Remote history of methicillin-resistant Staphylococcus aureus in      her knee after arthroscopy.  7. Elevated lipids.   PROCEDURE:  1. I and D of vulvar abscess with exam under anesthesia.  2. Packing of the abscess daily on the floor.   HISTORY OF PRESENT ILLNESS:  Written H and P is in the chart.   HOSPITAL COURSE:  The patient was admitted through MAU taken to the  operating room where an exam under anesthesia with I and D of vulvar  abscess was performed.  Ms. Ploch had a huge left vulvar abscess that  measured initially approximately 5 cm wide by approximately 12 cm in  height.  This was significant amount of purulent bloody material as well  as some clot and a tissue-looking material that was obtained from the  abscess.  It was packed in the OR and the patient was started on  Augmentin and doxycycline.  Culture was actually done in the office  before the patient was admitted to the hospital.  She was seen the next  day after the procedure and was afebrile.  She never had a white  count  with her highest white count being 10.4.  She also never had a left  shift.  The Iodoform was removed and repacked and the abscess had  significant decrease in size in the 12 hours or so after surgery.  It  was quite indurated on the first day postop with induration measuring 3  x 7 cm.  The patient was able to tolerate regular food and she used a  Dilaudid PCA for pain management.  She had an uneventful hospital day  and was seen on the second day postop.  She is feeling much better, pain  was under much better control.  Her T-max was 99.0, other vital signs  were stable.  No repeat labs were performed on this day.  Preliminary  culture was growing gram-negative rods because it was not a final  culture, I did not feel comfortable discontinuing the doxycycline.  Her  exam showed again a significant decrease in induration with the  induration primarily being inferiorly  measuring somewhere between 2 cm x  3.5 cm.  The Iodoform gauze again was removed and repacked and there was  no significant drainage noted from the abscess.  With the improvement I  had seen over the last 48 hours, I felt discharge home at this point was  appropriate.   She will be seen by home health for vulvar wound packing to be continued  over the next week.  She also will be seen back in my office on Tuesday  and Friday.  She will be transitioned to oral pain medicines and oral  antibiotics and she will continue on doxycycline 100 mg b.i.d. for 14  days and Augmentin 875 mg b.i.d. for 14 days.  She has a prescription  for Diflucan if she happens to have a yeast infection, and also for  oxycodone 5 mg 1-2 q. 4-6 h. p.r.n. pain.      Lum Keas, MD  Electronically Signed     MSM/MEDQ  D:  12/16/2008  T:  12/16/2008  Job:  [id number] [id number]

## 2011-02-18 NOTE — Op Note (Signed)
[name], [name]                ACCOUNT NO.:  [id number]   MEDICAL RECORD NO.:  [id number]          PATIENT TYPE:  INP   LOCATION:  9303                          FACILITY[facility]  [facility]   PHYSICIAN:  M. Tracy Quail, MD  DATE OF BIRTH:  04-04-1961   DATE OF PROCEDURE:  DATE OF DISCHARGE:                               OPERATIVE REPORT   PREOPERATIVE DIAGNOSES:  61. A 50 year old G2 P2 married white female with enlarged left labial      abscess.  2. History of Laennec's cirrhosis secondary to alcohol abuse.  3. Hepatomegaly and splenomegaly.  4. Umbilical hernia.  5. Remote history of methicillin-resistant Staphylococcus aureus in      her knee.  6. Elevated lipids.   POSTOPERATIVE DIAGNOSIS:  1. A 50 year old G2 P2 married white female with enlarged left labial      abscess.  2. History of Laennec's cirrhosis secondary to alcohol abuse.  3. Hepatomegaly and splenomegaly.  4. Umbilical hernia.  5. Remote history of methicillin-resistant Staphylococcus aureus in      her knee.  6. Elevated lipids.   PROCEDURE:  I&D of left labial abscess with exam under anesthesia and  packing of the left abscess.   SURGEON:  M. Tracy Quail, MD   ASSISTANT:  OR staff.   ANESTHESIA:  LMA, Dr. Tacy Dura oversaw the case.   FINDINGS:  Large left labial abscess measuring 4 cm x 9 cm with  induration anteriorly of the labia majora and the labia minora left  side.  She has some extending erythema onto the inner thighs slightly,  with probing of the abscess with a Q-tip, we probed out 2 cm in deep, 4  cm to the left, and about 3 cm anteriorly.   SPECIMENS:  None.   ESTIMATED BLOOD LOSS:  Minimal.   FLUIDS:  A 1,000 mL of LR.   URINE OUTPUT:  No catheterization was performed.   COMPLICATIONS:  None.   INDICATIONS:  [name] is a 50 year old G2 P2 married white  female who presented today at the clinic with the complaint of 2 days of  worsening left sided pain.  She has had trouble  sleeping due to the  pain.  On examination she had an absolutely huge left labial abscess and  had a significant area of fluctuation.  It was very obvious to see why  she was in the pain that she was complaining of.  The area that was  fluctuant was exquisitely tender, but I&D'ed with a #11 blade in the  office.  A large amount of purulent and bloody cloudy material came out  of this in the office.  Doing this was enough to give her relief so that  we could go to the operating room.  I sent her to the NAU where she  received a dose of IV antibiotics, some blood work was obtained.  Informed consent was obtained and the patient was prepared for the  operating room.  Informed consent is present on chart and the patient,  after blood work was back, was taken to the operating room.  PROCEDURE:  The patient was taken to the operating room.  She was placed  in supine position.  Anesthesia was administered by anesthesia staff  without difficulty.  Legs positioned in the high-lithotomy position in  the [place] stirrups.  She was prepped with Betadine.  Sterile gloves were  placed.  Then this abscess was palpated and examined under anesthesia.  It was relatively superficial and did not extend into the vagina.  The  area that was I&D'ed in the office was actually smaller now because the  swelling had gone down.  With a #15 blade this was opened to about a  centimeter and half.  Then using sterile Q-tip the abscess was probed  with the findings as noted above.  There is some more clot and junky-  looking tissue that came out of the abscess.  The only thing that  draining was blood.  The abscess cavity was then packed with a quarter-  inch gauze.  At this point the procedure was ended.  The skin was  cleansed off the Betadine, blood, and other material.  The legs were  positioned back in supine position.  She is awakened from anesthesia and  taken to her recovery room in stable condition.      Lum Keas, MD  Electronically Signed     MSM/MEDQ  D:  12/14/2008  T:  12/15/2008  Job:  [id number]

## 2011-02-18 NOTE — Discharge Summary (Signed)
[name], [name]                ACCOUNT NO.:  [id number]   MEDICAL RECORD NO.:  [id number]          PATIENT TYPE:  INP   LOCATION:  1426                         FACILITY:  [facility]   PHYSICIAN:  Tracy T. Russella Dar, MD, FACGDATE OF BIRTH:  1961-04-07   DATE OF ADMISSION:  10/04/2008  DATE OF DISCHARGE:  10/05/2008                               DISCHARGE SUMMARY   ADMITTING DIAGNOSES.:  77. A 50 year old white female with decompensated alcoholic liver      disease, now presenting with jaundice, ascites and progressive      hyponatremia.  2. Gastroesophageal reflux disease.  3. Previously documented portal gastropathy.  4. Macrocytic anemia.   DISCHARGE DIAGNOSES:  1. Hyponatremia significantly improved.  2. Macrocytic anemia.  3. Probable mild alcoholic hepatitis.  4. Mild elevation of serum ammonia, no clinical evidence for      encephalopathy.  5. Other diagnoses as listed above.   CONSULTATIONS:  None.   PROCEDURES:  The patient had large volume paracentesis done as an  outpatient on the day of admission.   BRIEF HISTORY:  This is a pleasant 50 year old white female who had been  seen in the office on December 29 in referral from Dr. Lucie [name]  office with significant abdominal distention and jaundice.  She was  found to have massive ascites and new onset of jaundice.  She had  previously been diagnosed with alcoholic liver disease.  She had no  evidence of esophageal varices on prior EGD done June 2009, but did have  evidence for portal hypertensive gastropathy.  At the time of  presentation in the office she did not have any peripheral edema but had  very tense ascites, and was uncomfortable.  She was set up for large  volume paracentesis and labs were done as well revealing a sodium of  122, potassium of 3.9.  The patient underwent a large volume  paracentesis on the day of admission with removal of 6.2 L. She received  albumin replacement and we repeated her labs on the  30th which showed a  sodium of 117. She is admitted at this time for medical management of  progressive hyponatremia.   LABORATORY STUDIES ON ADMISSION:  Sodium 117, potassium 3.9, chloride  85, BUN 7, creatinine 0.58, total bilirubin 5.9, alk phos 139, SGOT 99,  SGPT 16, albumin of 2.2.  Peritoneal fluid analysis with cell counts,  total protein less than 3, fluid cloudy 233 WBCs, 31% segs.  Cytology is  pending. Cultures are pending.   Repeat labs on December 31 show a WBC of 8.6, hemoglobin 8.7, hematocrit  of 25.3, platelets 206.  Pro time 18.5, INR of 1.5. Sodium 131,  potassium 3.6, BUN 5, creatinine 0.52.  Labs were repeated again several  hours later:  Sodium at 130, potassium 3.5, venous ammonia at the time  on December 31 was elevated at 58.  Serum osmolality low at 259.  TSH of  2.27. Urine sodium was 10.  Urine potassium was 3.  X-ray studies done:  None.   HOSPITAL COURSE:  The patient was admitted by Dr. Leone [name] who  was  covering on-call post paracentesis and albumin replacement.  She was  started on fluid restriction of 1000 mL per 24 hours. Also placed on a 2  gram sodium diet and normal saline at 100 mL per hour.  She had been on  a small dose of spirolactone 25 mg per day at home.  This was  discontinued.  She was noted to have mildly elevated venous ammonia but  no clinical evidence of encephalopathy and is started on low-dose  Chronulac at the time of discharge.  Her hyponatremia corrected quickly  and by the following day was up to 131. We repeated for accuracy several  hours later and was stable at 130. At this time she is allowed discharge  to home with instructions to follow up with Dr. Lina [name] on January 4  at 8:45 a.m. She and her family were instructed carefully in 1500 mL per  24 hour fluid restriction, strict adherence to a 2 grams sodium diet,  absolutely no alcohol intake whatsoever forever which she does seem to  be committed to at this time. Had a  long discussion with the patient and  her husband regarding the severity of her condition at this time.  She  will continue K-Dur 20 mEq one p.o. daily, Chronulac 30 mL p.o. daily,  multivitamin daily, Prilosec 20 mg p.o. daily.  Again will stop the  Aldactone for the time being.  We did replace her thiamine IV while  hospitalized. She will need careful outpatient follow-up.      Tracy Esterwood, PA-C      Tracy T. Russella Dar, MD, [facility]  Electronically Signed    AE/MEDQ  D:  10/05/2008  T:  10/05/2008  Job:  [id number]   cc:   Evelena Peat, M.D.   Hedwig Morton. Juanda Chance, MD  [address]  [place]  [place] [address]   Venita Lick. Russella Dar, MD, FACG  [address]  [place]  [place] [address]

## 2011-02-21 NOTE — Assessment & Plan Note (Signed)
[name] is a 50 year old married female who is being seen in our  [facility] for chronic left ankle pain. She is  status post ankle fracture July 25, 2006 and has been treated by Dr.  Lestine Box conservatively.   She also has a history of left knee pain.   She sustained the bimalleolar evulsion fracture almost 3 months ago now.  She had been in a Cam walker for several weeks and at the last visit was  in a short ankle brace. She has been undergoing physical therapy and is  now finished with it. She is currently no longer using the short ankle  brace.   She states that her average pain is about a 4 on a scale of 10,  moderately interfering with her activities. Her sleep is fair. She gets  fair relief with the current meds that she is on. She takes about one 50  mg Ultram a day for pain management and an occasional ibuprofen 400 mg  which she took typically prior to therapy, however, has not needed them  recently.   Last month, she was trialed on Flexeril and trazodone, she did not find  them particularly helpful for sleep or pain management.   She is walking up to 15 minutes at a time. She is able to climb stairs.  She is driving. She works about 12 hours a week. She is independent with  her self care. Needs some help with higher level household duties and  grocery shopping.   Denies depression, anxiety. Denies suicidal ideation.   No new changes in past medical history. No new changes on 14 point  system review which is attached to the chart.   No changes in social or family history since last visit.   PHYSICAL EXAMINATION:  Blood pressure is 130/89, pulse 88, respirations  17, 98% saturated on room air. She is a mildly obese white female who  appears her stated age. She is oriented x3. Her speech is clear. Her  affect is bright. She is alert, cooperative, and pleasant. She does not  appear in any distress.   She transitions from sit-to-stand  independently, does not have any pain  behaviors.   Her gait in the room is symmetric today, balance is good. No antalgia is  appreciated.  Heel, ankle mechanics appear near normal on the left side.   Examination of the left ankle reveals just a mild degree of swelling  around the lateral malleolus. She has just a small amount of tenderness  with palpation in this area as well, pulses are good. Sensation is  intact. Motor strength around the ankle is quite good in the 5/5 range.  She has just minimal limitations in dorsi flexion and plantar flexion.  She is able to invert and evert slightly as well and does not complain  of pain during these motions.   There is some slight discoloration to skin along the medial malleolar  area.   She has a couple areas where she has been scratching. She is wondering  if the Lidoderm might be causing her some irritation.   IMPRESSION:  1. Status post left bimalleolar evulsion fracture with left ankle      pain.  2. History of left knee pain.  3. History of elevated liver function tests.  4. History of moderate ethanol.   PLAN:  Patient is doing well with her pain management, taking up to one  50 mg Ultram per day and an occasional  ibuprofen 400 mg strength. We  reviewed the risks and benefits of taking an occasional ibuprofen with  her. We also discussed possibly starting some Ambien. She would prefer  not to start any more medications at this time.   She states that she is scheduled to have a D&C with Dr. Corrie Mckusick and  has been taken off ibuprofen prior to surgery.   She states that Dr. Hyacinth Meeker may be putting her on a narcotic after the  surgery and she is letting our clinic be aware of this.   [name] understands she should stop the Ultram while she is taking  any narcotics through Dr. Hyacinth Meeker. We will see [name] back in 2  months. She has been stable on the medications she has received through  this clinic. She has had no  aberrant behavior observed and she is  getting reasonable relief with the meds prescribed.           ______________________________  Brantley Stage, M.D.     DMK/MedQ  D:  11/04/2006 09:00:36  T:  11/04/2006 09:44:54  Job #:  [id number]

## 2011-02-21 NOTE — Op Note (Signed)
NAMEJULIE-ANN, [name]                ACCOUNT NO.:  [id number]   MEDICAL RECORD NO.:  [id number]          PATIENT TYPE:  REC   LOCATION:  TPC                          FACILITY:  [facility]   PHYSICIAN:  M. Leda Quail, MD  DATE OF BIRTH:  02/09/1961   DATE OF PROCEDURE:  11/09/2006  DATE OF DISCHARGE:                               OPERATIVE REPORT   PREOPERATIVE DIAGNOSES:  57. A 50 year old white female with menorrhagia.  2. Smoking history.  3. History of elevated liver function tests with negative workup for      hepatitis.  4. 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  5. Urinary tract infection.   POSTOPERATIVE DIAGNOSES:  28. A 50 year old white female with menorrhagia.  2. Smoking history.  3. History of elevated liver function tests with negative workup for      hepatitis.  4. 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  5. Urinary tract infection.   PROCEDURE:  Hysteroscopy with dilatation and curettage.   SURGEON:  M. Leda Quail, MD   ASSISTANT:  Operating room staff.   ANESTHESIA:  MAC.   SPECIMENS:  Endometrial curettings and polyp.   ESTIMATED BLOOD LOSS:  Minimal.   URINE OUTPUT:  Was 50 mL.   FLUIDS:  Was 700 mL of lactated Ringer's.   COMPLICATIONS:  None.   INDICATIONS FOR PROCEDURE:  [name] is a 50 year old white female  with a history of menorrhagia that has developed over the last one to  two years.  This was evaluated further in the clinic with a pelvic  ultrasound.  She had a thickened endometrium and a small intra-mural 1.2  cm fibroid.  She has had a history of a conization of the cervix and has  significant scarring to the cervix.  An attempt was made to proceed with  a Sono-hysterogram to evaluate for definitive polyps  and to also proceed  with an endometrial biopsy at the same time, but this was difficult due  to the scarring of the cervix and the patient's level of tolerance of  the procedure.  We decided to go ahead and discuss doing this in the  operating room.  The patient was in agreement with it.  She was  consented.  The risks and benefits were discussed.  She is here today to  undergo the procedure as discussed above.   DESCRIPTION OF PROCEDURE:  The patient is taken to the operating room.  She is placed in the supine position.  General anesthesia with MAC was  administered without difficulty.  Her legs were positioned in the dorsal  lithotomy position in the [place] stirrups, with care taken to ensure that  no pressure was on the ankles or the knees.  The patient was prepped in  a normal sterile fashion.  A Foley catheter was  used to drain the  bladder of all urine.  The patient was given 2 grams of cefoxitin IV  before the beginning of the procedure, specifically because of the  urinary tract infection that was present on preoperative labs.  The  patient was then draped in a normal sterile fashion.  A bi-valve  speculum was placed in the vagina and the anterior lip was grasped with  a single tooth tenaculum.  The uterus was then sounded to 7.5 cm.  The  cervix was then dilated with Shawnie Pons dilators up to a #23.  The diagnostic  scope was then advanced to the cervical os and 3% Sorbitol was used as  hysteroscopic media.  The endometrial cavity did not completely distend  well, but it did appear smooth and clean, except on the patient's right  side where there was some irregular tissue.  I was unsure if this was  due to the dilation of the cervix or if there was possibly some clot on  it.  At this point, the hysteroscopic pictures were obtained and then  the hysteroscope was removed.  A #1 rough curet was then used to curet  the endometrium until a gritty texture was noted in all quadrants.  A   definitive polyp was removed from the right side of the uterus where the  irregular tissue was noted.  The endometrial cavity was then re-  inspected and no abnormal tissue was noted.  At this point the procedure  was ended; however, 10 mL of 1% lidocaine were instilled in the cervix  to help with postoperative pain and 2.5 mL were instilled at the 3, 6, 9  and 12 o'clock positions.   The patient tolerated the procedure well.  The tenaculum was removed  from the cervix and there was minimal bleeding from the cervix.  All  instruments were removed from the vagina at this time.  The patient was  positioned back in the supine position and she was awakened from  anesthesia without difficulty.  The sponge, lap and instrument counts  were correct x2.  There were no needles used in the field.      Tracy Keas, MD  Electronically Signed     MSM/MEDQ  D:  11/09/2006  T:  11/09/2006  Job:  [id number]

## 2011-02-21 NOTE — Assessment & Plan Note (Signed)
[name] is a 50 year old married female who is being seen in our pain  and rehabilitative clinic for chronic left ankle pain and she also has a  history of left knee pain.   She sustained a bimalleolar avulsion fracture back on October 20.  She  has been followed by Dr. Lestine Box.  She had been in a [employer] and  currently in a short ankle brace at this time and has been undergoing  physical therapy twice a week over the last four weeks.   She states her average pain in the left ankle is about a 5 on a scale of  10.  She did not need to take all of the Ultram which was prescribed at  the last visit.  She was given a prescription for 90 tablets.  She  states she has about half of this prescription left.  She is not  requesting a refill today.   Her chief complaint is the left ankle pain, typically worse at night,  inhibits her from sleeping.  She is requesting something to help her  sleep.  Flexeril was tried last month.  She did not note any improvement  in her sleep with the use of this.   MEDICATIONS:  She has been taking Ibuprofen prior to her physical  therapy appointment, however, she has not been taking it in the evening.  She states she was unclear that she could take it with the Ultram even  though it was discussed that she could take it twice a day with the  Ultram.   She continues to use Lidoderm as well, is using it primarily on her  ankle during the day.  She has not been using it on her knee.   She states her pain improves with rest and some ice.  Medication also  helps.  Typically worse with standing and walking, typically worse  toward the end of the day and in to the evening.   She can walk about 15 minutes at a time comfortably.  She is able to  climb stairs though she has a little bit of trouble with this yet.  She  is driving.  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   Denies depression, anxiety, suicidal ideation.   No changes in her past  medical history in the last month.   PHYSICAL EXAMINATION:  VITAL SIGNS:  Blood pressure 132/83, pulse 99,  respirations 16, 99% saturation on room air.  GENERAL:  She is a well-developed, well-nourished female who appears her  stated age.  She is oriented times three.  Her affect is bright, alert.  She is cooperative and pleasant.  Speech is clear.   She transitions from sit to stand easily.  Her gait in the room shows  some slight antalgia, favoring her left foot.  She is able to produce a  gait with heel strike and toe walk on the left.  Control of her  dorsiflexors are not quite smooth yet.  Her strength is slowly coming  back, however.   Her sensation is intact with light touch around the left leg.  She does  have some sensitivity at the medial and lateral malleolus.  Some slight  discoloration is noted in the skin along the medial malleolar area.  Skin is intact, however, the foot is warm.  Pulses are intact.   Reflexes are symmetric at bilateral patellar tendons and Achilles  tendons.  Range of motion is slightly diminished with  eversion on the  left.   IMPRESSION:  1. Status post left bimalleolar avulsion fracture with left ankle      pain, especially with standing, walking, currently in a short ankle      brace.  2. History of elevated liver function tests.  Apparently these were      checked last month and still elevated.  3. History of moderate ETOH use.   PLAN:  She does not need a refill on her Ultram today.  We will trial  her on 1/2 tablet of trazodone at night to see if we can help her with  sleep.  Advised her to use Lidoderm patches not only on her ankle but on  her left knee as well.  Continue to ice in the evening and elevate in  the evening.  Would also allow her to use another 400 milligrams of  Ibuprofen toward the end of the day.  This was discussed with her as  well.   The risks and benefits of ibuprofen were reviewed with her.  She was  given 15  tablets of 50 mg trazodone tablets.  She will take 1/2 tablet  p.o. at bedtime for insomnia.  If this does not work well for her, may  consider trialing her on Ambien CR 10-15 tablets per month.  We will see  her back in one month.           ______________________________  Brantley Stage, M.D.     DMK/MedQ  D:  10/12/2006 10:17:24  T:  10/12/2006 10:45:18  Job #:  [id number]

## 2011-02-21 NOTE — Group Therapy Note (Signed)
[name] is a 50 year old married female referred to our pain  and rehabilitation clinic by Dr. Lestine Box. She is status post a  bimalleolar evulsion fracture which she sustained after a fall down the  stairs on October 20th.  She was initially seen in the emergency room and then followed up with  Dr. Lestine Box. She was initially treated with a cast and then placed in a  Cam walker.  She states that she will be starting physical therapy in the next week  or so.  Tracy Keatley states she has a history of some elevated liver functions.  She is unable to give me anymore information other than that regarding  her liver.  She also states that she may have been taking too may Vicodin over a  short period of time and had been referred to our pain clinic for  further management regarding her narcotics.  She states she has been on Ultram now since November 9th. She was  prescribed 60 tablets at that time and still has apparently several  left.  She states her pain is localized to the left ankle. She also has some  left knee pain which is a residual from previous knee surgery and an  infection.  Her pain is about a 5 on a scale of 10, worse with activity, walking,  and standing. Improves with rest and medication.  She reports good relief when she does take the Ultram, however she  states that it does not last after about 4 to 5 hours.  Pain is described as aching and again mostly localized to the left  ankle.  She can walk about 10 minutes at a time. She is able to climb stairs.  She is driving. 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  She is independent with her self care. She denies suicidal ideations.  Denies depression or anxiety. Health and history form are positive for  night sweats, coughing, and weight gains.  Physicians currently involved in her care include; Werner Lean, and Dr.  Lestine Box and she sees a gynecologist, Dr. Hyacinth Meeker.   PAST MEDICAL HISTORY:   Positive for history of elevated liver functions  per patient. I do not have any records supporting this in my chart  today.   PAST SURGICAL HISTORY:  Positive for knee surgery 2000, 2001, 2002. 1993  heal spur surgery, 1989 C-section.   SOCIAL HISTORY:  The patient is married, lives with her husband and 58  and 55 year old children. Denies illegal substance abuse. Does admit to  moderate alcohol use 1 to 2 drinks per day and a bit more on the  weekend. One and a half packs of cigarettes since age 3.   FAMILY HISTORY:  The patient was adopted.   MEDICATIONS:  Tramadol 50 mg 1 tablet q. 4 to 6 hours prescribed by Dr.  Lestine Box.   PHYSICAL EXAMINATION:  Blood pressure 138/92, pulse 81, respirations 16,  98% saturated on room air. She is a well-developed, well-nourished  female who appears her stated age. She is oriented x 3. Her affect is  bright, alert, cooperative, and pleasant. Speech is clear. She follows  commands without any difficulty.  She is able to transition from sit to stand easily. She is currently  ambulating somewhat antalgically in her Cam walker.  Cam walker was removed, left lower extremity was evaluated. She had some  tenderness along the mediolateral joint line of the left knee. Has some  discomfort  with full extension. The left ankle is slightly modeled in  appearance compared to the right. Temperature is warm. Pulses are  intact. She is able to wiggle her toes without difficultly. Her  sensation is intact and compared to the right lower extremity there is  no differences in side to side with light touch or pin prick. She has no  nail bed changes. Manual muscle testing was not carried out today due to  healing evulsion fractures. She does have some mild tenderness medially  as well as along the lateral malleolus.   IMPRESSION:  1. Status post left bimalleolar evulsion fracture with left ankle pain      especially with standing and walking in Cam walker.  2.  History of elevated liver functions etiology not clear.  3. History of moderate ETOH use.   PLAN:  Will avoid acetaminophen with this woman. We will check a urine  drug screen today. We will refill her Ultram for her 1 tablet not more  than 3 times a day. A call was placed to Dr. Lestine Box office to clear the  use of a nonsteroidal antiinflammatory medication with him. Apparently  this is ok. We will allow her to start using some Ibuprofen 400 mg prior  to physical therapy and another 400 mg 4 to 6 hours after therapy.  Advised her to use some Lidoderm as well. She apparently has some  leftover from previous knee surgery, not more than 3 patches, 12 hours  on 12 hours off. Instructions were reviewed with her on how to use the  Lidoderm patches. To help her rest at night we will add some Flexeril 1  to 2 tablets of 5 mg q.h.s. p.r.n. We will see her back in a month.           ______________________________  Brantley Stage, M.D.     DMK/MedQ  D:  09/14/2006 10:13:48  T:  09/14/2006 12:51:35  Job #:  [id number]   cc:   Leonides Grills, M.D.  Fax: [phone]

## 2011-02-21 NOTE — Op Note (Signed)
[name], [name]                ACCOUNT NO.:  [id number]   MEDICAL RECORD NO.:  [id number]          PATIENT TYPE:  AMB   LOCATION:  [facility]                          FACILITY:  [facility]   PHYSICIAN:  Leonides Grills, M.D.     DATE OF BIRTH:  1961-06-05   DATE OF PROCEDURE:  01/12/2007  DATE OF DISCHARGE:                               OPERATIVE REPORT   PREOPERATIVE DIAGNOSES:  1. Anterior left ankle impingement.  2. Left medial talar dome osteochondral lesion.   POSTOPERATIVE DIAGNOSES:  1. Anterior left ankle impingement.  2. Left medial talar dome osteochondral lesion.   OPERATION:  1. Left ankle arthroscopy with extensive debridement.  2. Left debridement and drilling talar dome osteochondral lesion.   ANESTHESIA:  General.   SURGEON:  Leonides Grills, M.D.   ASSISTANT:  Evlyn Kanner, P.A.-C   ESTIMATED BLOOD LOSS:  Minimal.   TOURNIQUET TIME:  Approximately 15 minutes.   COMPLICATIONS:  None.   DISPOSITION:  Stable to PAR.   INDICATIONS:  This is a 50 year old female who has had longstanding left  ankle pain after bimalleolar ankle fracture ORIF.  She was consented for  the above procedure.  . All risks, which include infection, nerve and  vessel injury, persistent pain, worse pain, prolonged recovery,  stiffness, arthritis were all explained.  Questions were encouraged and  answered.   OPERATIVE REPORT:  The patient was brought to the operating room, placed  in supine position. After adequate general endotracheal tube anesthesia  was  administered, as well as Ancef 1 g IV piggyback, the patient was then  placed in the sloppy lateral position on a bean bag.  All bony  prominences were well padded.  The left lower extremity was then prepped  and draped in a sterile manner over a proximally placed thigh  tourniquet, which was not used until the very end of the case.  With the  left lower extremity prepped and draped in a standard manner, anatomic  landmarks included the  anterior tibialis tendons, as well as peroneus  tertius tendon were mapped out.  Superficial peroneal nerve could not be  seen.  Spinal needle was then placed medial to the anterior tibialis  tendon and 20 mL of normal saline was instilled in the ankle.  Weston Brass and  spread technique was then utilized to create the anteromedial portal and  then blunt tip trocar with cannula, followed by camera, was placed in  the ankle under direct visualization, illuminating the skin from inside  out.  The anterolateral portal was created with a nick and spread  technique.  Superficial peroneal nerve could not be seen within the  illumination.  There was a tremendous amount of synovitis  anterolaterally.  This was extensively debrided with a radiofrequency  bevel, as well as a shaver.  The accessory tib fib ligament was rubbing  the anterolateral corner of the talar dome and there was synovitis  around this entire area.  This was also resected.  The lateral gutter of  the ankle also had a large amount of synovitis and this was resected as  well.  Once we did the extensive debridement, we ranged the ankle and  there was no impinging areas.  We then placed a camera anterolaterally,  visualized anteromedially.  Again, there was a tremendous amount of  synovitis in the anteromedial aspect of the ankle and this extended into  the medial gutter.  This was also extensively debrided with a bevel  shaver.  Pictures were obtained throughout the procedure.  There was  calcification within the deep deltoid ligament fibers and this was  rubbing the medial aspect of the talar dome.  This was also resected  with a bevel, as well as a shaver and small grabber.  Once this was  completely removed, we then probed the medial talar dome.  There was an  osteochondral lesion, measuring about 5 x 4 mm.  This was then debrided  with a house curet, as well as a shaver, back to normal cartilage.  Multiple drill holes were placed with  a microfracture technique.  Just  prior to doing this, debriding the lesion to get excellent  visualization, we did elevate the tourniquet for about 15 minutes and  then deflated the tourniquet to visualize bleeding of the lesion once  the drill holes were placed.  This had excellent bleeding.  Superior to  this in the anteromedial tibial plafond, there was an osteochondral  lesion in this area as well.  This was also debrided back of scar  tissue.  Pictures again were obtained throughout the procedure.  Cameras  were removed.  Wounds were closed with 4-0 nylon stitch.  Sterile  dressing was applied.  CAM walker boot is applied.  The patient is  stable to the PAR.   POSTOPERATIVE COURSE:  The patient will follow up in one week.  At that  time will remove the dressing, as well as sutures.  She will then  continue nonweightbearing for a total of six weeks.  She will start  therapy immediately for __________ range of motion, exercising the ankle  and subtalar joint with __________ strengthening as well.  At six weeks  postop, will have her undergo aggressive weightbearing program in a CAM  walker booth to full weightbearing.  At nine to ten weeks postop, she  will go into an ASO brace and undergo more aggressive proprioception and  strengthening and then wean herself out of the ASO brace, as  proprioception and strengthening improve.  Elevation and active range of  motion of her toes are encouraged.      Leonides Grills, M.D.  Electronically Signed     PB/MEDQ  D:  01/12/2007  T:  01/12/2007  Job:  [id number]   cc:   Neta Mends. Fabian Sharp, MD

## 2011-07-11 LAB — COMPREHENSIVE METABOLIC PANEL
Albumin: 2.2 g/dL — ABNORMAL LOW (ref 3.5–5.2)
Alkaline Phosphatase: 139 U/L — ABNORMAL HIGH (ref 39–117)
BUN: 7 mg/dL (ref 6–23)
GFR calc non Af Amer: >60 mL/min (ref >60)
Potassium: 3.9 mEq/L (ref 3.5–5.1)
Sodium: 117 mEq/L — CL (ref 135–145)
Total Protein: 6.9 g/dL (ref 6.0–8.3)

## 2011-07-11 LAB — BASIC METABOLIC PANEL
CO2: 26 mEq/L (ref 19–32)
Calcium: 8.3 mg/dL — ABNORMAL LOW (ref 8.4–10.5)
Calcium: 8.3 mg/dL — ABNORMAL LOW (ref 8.4–10.5)
GFR calc Af Amer: >60 mL/min (ref >60)
GFR calc Af Amer: >60 mL/min (ref >60)
GFR calc non Af Amer: >60 mL/min (ref >60)
GFR calc non Af Amer: >60 mL/min (ref >60)
Potassium: 3.5 mEq/L (ref 3.5–5.1)
Sodium: 130 mEq/L — ABNORMAL LOW (ref 135–145)
Sodium: 131 mEq/L — ABNORMAL LOW (ref 135–145)

## 2011-07-11 LAB — BODY FLUID CELL COUNT WITH DIFFERENTIAL
Lymphs, Fluid: 65 %
Monocyte-Macrophage-Serous Fluid: 4 % — ABNORMAL LOW (ref 50–90)
Total Nucleated Cell Count, Fluid: 233 cu mm (ref 0–1000)

## 2011-07-11 LAB — BODY FLUID CULTURE: Culture: NO GROWTH

## 2011-07-11 LAB — NA AND K (SODIUM & POTASSIUM), RAND UR: Potassium Urine: 3 mEq/L

## 2011-07-11 LAB — CBC
Hemoglobin: 8.7 g/dL — ABNORMAL LOW (ref 12.0–15.0)
MCHC: 34.3 g/dL (ref 30.0–36.0)
RBC: 2.17 MIL/uL — ABNORMAL LOW (ref 3.87–5.11)
WBC: 8.6 10*3/uL (ref 4.0–10.5)

## 2011-07-11 LAB — PROTIME-INR: INR: 1.5 (ref 0.00–1.49)

## 2011-07-11 LAB — TSH: TSH: 2.271 u[IU]/mL (ref 0.350–4.500)

## 2011-07-11 LAB — AMMONIA: Ammonia: 58 umol/L — ABNORMAL HIGH (ref 11–35)

## 2012-08-31 ENCOUNTER — Other Ambulatory Visit: Payer: Self-pay | Admitting: Family Medicine

## 2012-08-31 DIAGNOSIS — M79672 Pain in left foot: Secondary | ICD-10-CM

## 2012-08-31 DIAGNOSIS — G609 Hereditary and idiopathic neuropathy, unspecified: Secondary | ICD-10-CM

## 2012-08-31 DIAGNOSIS — M79671 Pain in right foot: Secondary | ICD-10-CM

## 2012-09-06 ENCOUNTER — Ambulatory Visit
Admission: RE | Admit: 2012-09-06 | Discharge: 2012-09-06 | Disposition: A | Discharge status: Home / Self Care | Payer: BC Managed Care – PPO | Source: Ambulatory Visit | Attending: Family Medicine | Admitting: Family Medicine

## 2012-09-06 DIAGNOSIS — M79671 Pain in right foot: Secondary | ICD-10-CM

## 2012-09-06 DIAGNOSIS — M79672 Pain in left foot: Secondary | ICD-10-CM

## 2012-09-06 DIAGNOSIS — G609 Hereditary and idiopathic neuropathy, unspecified: Secondary | ICD-10-CM

## 2015-03-02 ENCOUNTER — Encounter: Payer: Self-pay | Admitting: Internal Medicine

## 2015-05-01 ENCOUNTER — Other Ambulatory Visit: Payer: Self-pay | Admitting: Family Medicine

## 2015-05-01 ENCOUNTER — Ambulatory Visit
Admission: RE | Admit: 2015-05-01 | Discharge: 2015-05-01 | Disposition: A | Discharge status: Home / Self Care | Payer: BC Managed Care – PPO | Source: Ambulatory Visit | Attending: Family Medicine | Admitting: Family Medicine

## 2015-05-01 DIAGNOSIS — T148XXA Other injury of unspecified body region, initial encounter: Secondary | ICD-10-CM

## 2015-05-01 DIAGNOSIS — R609 Edema, unspecified: Secondary | ICD-10-CM

## 2015-07-23 ENCOUNTER — Other Ambulatory Visit: Payer: Self-pay | Admitting: Specialist

## 2015-07-23 DIAGNOSIS — M5416 Radiculopathy, lumbar region: Secondary | ICD-10-CM

## 2015-08-11 ENCOUNTER — Ambulatory Visit
Admission: RE | Admit: 2015-08-11 | Discharge: 2015-08-11 | Disposition: A | Discharge status: Home / Self Care | Payer: BC Managed Care – PPO | Source: Ambulatory Visit | Attending: Specialist | Admitting: Specialist

## 2015-08-11 DIAGNOSIS — M5416 Radiculopathy, lumbar region: Secondary | ICD-10-CM

## 2015-08-28 ENCOUNTER — Other Ambulatory Visit: Payer: Self-pay | Admitting: Specialist

## 2015-08-28 DIAGNOSIS — M5416 Radiculopathy, lumbar region: Secondary | ICD-10-CM

## 2015-09-05 ENCOUNTER — Ambulatory Visit
Admission: RE | Admit: 2015-09-05 | Discharge: 2015-09-05 | Disposition: A | Discharge status: Home / Self Care | Payer: BC Managed Care – PPO | Source: Ambulatory Visit | Attending: Specialist | Admitting: Specialist

## 2015-09-05 DIAGNOSIS — M5416 Radiculopathy, lumbar region: Secondary | ICD-10-CM

## 2015-09-05 MED ORDER — IOHEXOL 180 MG/ML  SOLN
1.0000 mL | Freq: Once | INTRAMUSCULAR | Status: AC | PRN
  Administered 2015-09-05: 1 mL via INTRA_ARTICULAR

## 2015-09-05 MED ORDER — METHYLPREDNISOLONE ACETATE 40 MG/ML INJ SUSP (RADIOLOG
120.0000 mg | Freq: Once | INTRAMUSCULAR | Status: AC
  Administered 2015-09-05: 120 mg via INTRA_ARTICULAR

## 2015-09-05 NOTE — Discharge Instructions (Signed)

## 2015-11-28 DIAGNOSIS — G8929 Other chronic pain: Secondary | ICD-10-CM | POA: Diagnosis present

## 2015-11-28 DIAGNOSIS — D696 Thrombocytopenia, unspecified: Secondary | ICD-10-CM | POA: Insufficient documentation

## 2015-11-28 DIAGNOSIS — E876 Hypokalemia: Secondary | ICD-10-CM | POA: Insufficient documentation

## 2015-11-28 DIAGNOSIS — R42 Dizziness and giddiness: Secondary | ICD-10-CM | POA: Insufficient documentation

## 2015-11-28 DIAGNOSIS — Z720 Tobacco use: Secondary | ICD-10-CM | POA: Insufficient documentation

## 2015-11-28 DIAGNOSIS — G609 Hereditary and idiopathic neuropathy, unspecified: Secondary | ICD-10-CM | POA: Insufficient documentation

## 2015-11-28 DIAGNOSIS — R7301 Impaired fasting glucose: Secondary | ICD-10-CM | POA: Insufficient documentation

## 2015-11-28 DIAGNOSIS — M279 Disease of jaws, unspecified: Secondary | ICD-10-CM | POA: Insufficient documentation

## 2015-11-28 DIAGNOSIS — M7989 Other specified soft tissue disorders: Secondary | ICD-10-CM | POA: Insufficient documentation

## 2015-12-28 ENCOUNTER — Encounter: Payer: Self-pay | Admitting: Gastroenterology

## 2016-04-09 ENCOUNTER — Encounter: Payer: Self-pay | Admitting: Hematology

## 2016-04-09 ENCOUNTER — Telehealth: Payer: Self-pay | Admitting: Hematology

## 2016-04-09 NOTE — Telephone Encounter (Signed)
Appointment with Irene Limbo on 7/17 at 11:00am. Patient agreed. Demographics verified, location given. Letter to the referring.

## 2016-04-21 ENCOUNTER — Encounter: Payer: Self-pay | Admitting: Hematology

## 2016-04-21 ENCOUNTER — Ambulatory Visit (HOSPITAL_BASED_OUTPATIENT_CLINIC_OR_DEPARTMENT_OTHER): Payer: BC Managed Care – PPO | Admitting: Hematology

## 2016-04-21 VITALS — BP 141/73 | HR 73 | Temp 98.5°F | Resp 18 | Ht 69.0 in | Wt 244.4 lb

## 2016-04-21 DIAGNOSIS — D696 Thrombocytopenia, unspecified: Secondary | ICD-10-CM | POA: Diagnosis not present

## 2016-04-21 DIAGNOSIS — E538 Deficiency of other specified B group vitamins: Secondary | ICD-10-CM | POA: Diagnosis not present

## 2016-04-21 DIAGNOSIS — R161 Splenomegaly, not elsewhere classified: Secondary | ICD-10-CM

## 2016-04-21 DIAGNOSIS — F17210 Nicotine dependence, cigarettes, uncomplicated: Secondary | ICD-10-CM

## 2016-04-21 DIAGNOSIS — G629 Polyneuropathy, unspecified: Secondary | ICD-10-CM | POA: Diagnosis not present

## 2016-04-21 DIAGNOSIS — E039 Hypothyroidism, unspecified: Secondary | ICD-10-CM

## 2016-04-21 NOTE — Progress Notes (Signed)
Marland Kitchen    HEMATOLOGY/ONCOLOGY CONSULTATION NOTE  Date of Service: 04/21/2016  Patient Care Team: [name]. Nolon Rod, MD as PCP - General (Family Medicine)  CHIEF COMPLAINTS/PURPOSE OF CONSULTATION:  thrombocytopenia  HISTORY OF PRESENTING ILLNESS:   [name] is a wonderful 55 y.o. female who has been referred to [place] by Legrand Rams PA-C for evaluation and management of thrombocytopenia.  Patient reports that she has had low platelets for at least 5 years and that the been in the 120-140k range. She was recently noted to have platelet counts were slightly lower at 114k on 03/28/2016. Her hemoglobin was normal at 14.6 and her WBC count was normal at 6.4k. Patient reports no issues with excessive bruising or bleeding. Has been known to have liver cirrhosis with splenomegaly noted on ultrasound of the abdomen in 2009. She has been on chronic NSAIDs previously diclofenac and more recently ibuprofen twice daily.  No recent viral infections. Has had a history of vitamin B12 deficiency and has been on weekly subcutaneous B12 replacement. No other acute new symptoms. Has been started on Synthroid for the last 2 weeks.   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 Marital Status: Married    Spouse Name: N/A  . Number of Children: N/A  . Years of Education: N/A    Occupational History  . Not on file.   Social History Main Topics  . Smoking status: Current Every Day Smoker -- 30 years  . Smokeless tobacco: Never Used  . Alcohol Use: No  . Drug Use: No  . Sexual Activity: Yes   Other Topics Concern  . Not on file   Social History Narrative  . No narrative on file  Works as a Consulting civil engineer in a school Previous history alcohol abuse quit 8 years ago. Smokes half to 1 pack per day since age 74 years No other drug use   FAMILY HISTORY: Unknown-patient report she was adopted.  ALLERGIES:  is allergic to vancomycin. Sulfamethoxazole and teichoplanin  MEDICATIONS:  Current Outpatient Prescriptions  Medication Sig Dispense Refill  . cyanocobalamin (,VITAMIN B-12,) 1000 MCG/ML injection     . Diclofenac Sodium (PENNSAID) 2 % SOLN     . folic acid (FOLVITE) 1 MG tablet Take 1 mg by mouth.    . hydrochlorothiazide (HYDRODIURIL) 12.5 MG tablet     . Ibuprofen-Famotidine 800-26.6 MG TABS Take by mouth.    . levothyroxine (SYNTHROID) 25 MCG tablet Take by mouth.    . naloxegol oxalate (MOVANTIK) 12.5 MG TABS tablet Take 12.5 mg by mouth.    . oxyCODONE-acetaminophen (PERCOCET) 10-325 MG tablet     . pregabalin (LYRICA) 150 MG capsule Take 150 mg by mouth.    . rosuvastatin (CRESTOR) 10 MG tablet Take 10 mg by mouth.    . SYRINGE-NEEDLE, DISP, 3 ML (B-D 3CC LUER-LOK SYR 25GX1") 25G X 1" 3 ML MISC     . tiZANidine (ZANAFLEX) 4 MG tablet Take 4  mg by mouth.    . Vitamin D, Ergocalciferol, (DRISDOL) 50000 units CAPS capsule      No current facility-administered medications for this visit.    REVIEW OF SYSTEMS:    10 Point review of Systems was done is negative except as noted above.  PHYSICAL EXAMINATION: ECOG PERFORMANCE STATUS: 1 - Symptomatic but completely ambulatory  . Filed Vitals:   04/21/16 1107  BP: 141/73  Pulse: 73  Temp: 98.5 F (36.9 C)  Resp: 18   Filed Weights   04/21/16 1107  Weight: 244 lb 6.4 oz (110.859  kg)   .Body mass index is 36.08 kg/(m^2).  GENERAL:alert, in no acute distress and comfortable SKIN: skin color, texture, turgor are normal, no rashes or significant lesions EYES: normal, conjunctiva are pink and non-injected, sclera clear OROPHARYNX:no exudate, no erythema and lips, buccal mucosa, and tongue normal  NECK: supple, no JVD, thyroid normal size, non-tender, without nodularity LYMPH:  no palpable lymphadenopathy in the cervical, axillary or inguinal LUNGS: clear to auscultation with normal respiratory effort HEART: regular rate & rhythm,  no murmurs and no lower extremity edema ABDOMEN: abdomen obese, soft, non-tender, normoactive bowel sounds , just palpable liver and spleen. Musculoskeletal: no cyanosis of digits and no clubbing  PSYCH: alert & oriented x 3 with fluent speech NEURO: no focal motor/sensory deficits  LABORATORY DATA:  I have reviewed the data as listed  . CBC Latest Ref Rng 03/13/2010 02/07/2010 02/01/2010  WBC 4.5-10.5 10*3/microliter 7.9 19.0(H) 7.7  Hemoglobin 12.0-15.0 g/dL 13.4 12.7 13.9  Hematocrit 36.0-46.0 % 38.8 36.9 40.6  Platelets 150.0-400.0 K/uL 181.0 164 155    . CMP Latest Ref Rng 07/03/2010 04/24/2010 03/13/2010  Glucose 70-99 mg/dL 84 90 106(H)  BUN 6-23 mg/dL 14 18 17   Creatinine 0.4-1.2 mg/dL 0.9 0.7 0.8  Sodium 135-145 meq/L 136 132(L) 134(L)  Potassium 3.5-5.1 meq/L 4.3 4.2 3.8  Chloride 96-112 meq/L 101 91(L) 94(L)  CO2 19-32 meq/L 27 30 31   Calcium 8.4-10.5 mg/dL 9.6 10.1 10.5  Total Protein 6.0-8.3 g/dL - 8.5(H) 8.0  Total Bilirubin 0.3-1.2 mg/dL - 1.1 1.1  Alkaline Phos 39-117 units/L - 86 78  AST 0-37 units/L - 39(H) 32  ALT 0-35 units/L - 31 22     RADIOGRAPHIC STUDIES: I have personally reviewed the radiological images as listed and agreed with the findings in the report. No results found.  ASSESSMENT & PLAN:   55 year old Caucasian female with  #1 chronic thrombocytopenia which is somewhat worse. Patient has  reportedly had platelet counts in the 120-140k range for the last 5 years or more. Recently was lower at 114k This appears to be most likely related to the patient's liver cirrhosis with splenomegaly causing hypersplenism with gradual drop of platelets numbers progressively with increase in spleen size. She has also had B12 deficiency which is now replaced. Recently diagnosed hypothyroidism. The B12 deficiency and hypothyroidism associated with each other increase the possibility of autoimmune pernicious anemia and chronic thyroiditis. This potentially could be associated with ITP as well. Platelets also could be somewhat lower due to her chronic use of NSAIDs.  #2 B12 deficiency due to unclear etiology. #3 neuropathy possibly related to B12 deficiency. #4 hypothyroidism possibly due to chronic thyroiditis Plan -Labs ordered for workup of thrombocytopenia. -Smear to rule out pseudothrombocytopenia. -B12, hepatitis C, HIV -Ultrasound abdomen to reevaluate spleen size and liver pathology. -Avoid NSAIDs if possible. - she reports that she is scheduled for spinal stability replacement for pain control with her degenerative disc  disease. -Minimize medications that can cause thrombocytopenia. -No indication for platelet transfusion at this time. -Will check antiparietal cell and anti-intrinsic factor antibodies to evaluate for pernicious anemia.  #5  cigarette smoking. -Counseled extensively on smoking cessation. -Would be a candidate for low-dose CT chest for lung cancer screening.  #6 vitamin B12 deficiency rule out pernicious anemia. Antiparietal cell and anti-intrinsic factor antibodies. Could consider starting sublingual B 12 liquid 1000 micrograms daily and weaning off the [place] B12 from once weekly to once a month. Return to care with Dr. Irene Limbo in one month with lab results and ultrasound of the abdomen.   Orders Placed This Encounter  Procedures  . US Abdomen Complete    Standing  Status: Future     Number of Occurrences:      Standing Expiration Date: 04/21/2017    Order Specific Question:  Reason for Exam (SYMPTOM  OR DIAGNOSIS REQUIRED)    Answer:  evaluation for liver disease and splenomegaly -- patient with thrombocytopenia    Order Specific Question:  Preferred imaging location?    Answer:  [facility]  . CBC & Diff and Retic    Standing Status: Future     Number of Occurrences:      Standing Expiration Date: 04/21/2017  . Comprehensive metabolic panel    Standing Status: Future     Number of Occurrences:      Standing Expiration Date: 04/21/2017  . Smear    Standing Status: Future     Number of Occurrences:      Standing Expiration Date: 04/21/2017  . Vitamin B12    Standing Status: Future     Number of Occurrences:      Standing Expiration Date: 04/21/2017  . Anti-parietal antibody    Standing Status: Future     Number of Occurrences:      Standing Expiration Date: 04/21/2017  . Intrinsic factor antibodies    Standing Status: Future     Number of Occurrences:      Standing Expiration Date: 04/21/2017  . Ferritin    Standing Status: Future     Number of Occurrences:      Standing Expiration Date: 04/21/2017  . Hepatitis C antibody    Standing Status: Future     Number of Occurrences:      Standing Expiration Date: 04/21/2017     All of the patients questions were answered with apparent satisfaction. The patient knows to call the clinic with any problems, questions or concerns.  I spent 55 minutes counseling the patient face to face. The total time spent in the appointment was 60 minutes and more than 50% was on counseling and direct patient cares.    Sullivan Lone MD [place] [facility] AAHIV[facility] Hematology/Oncology Physician [facility]  (Office):       [phone] (Work cell):  [phone] (Fax):           [phone]  04/21/2016 11:32 AM

## 2016-04-22 ENCOUNTER — Telehealth: Payer: Self-pay | Admitting: Hematology

## 2016-04-22 NOTE — Telephone Encounter (Signed)
Per 7/17 pof no need for follow up visit, however patient was to return to lab yesterday and did not. Spoke with desk nurse and patient does need to rtn for lab. Left message for patient re lab 7/20.

## 2016-04-24 ENCOUNTER — Other Ambulatory Visit: Payer: BC Managed Care – PPO

## 2016-05-05 ENCOUNTER — Encounter (HOSPITAL_COMMUNITY): Payer: Self-pay

## 2016-05-05 ENCOUNTER — Ambulatory Visit: Payer: Self-pay | Admitting: Physician Assistant

## 2016-05-05 ENCOUNTER — Encounter (HOSPITAL_COMMUNITY)
Admission: RE | Admit: 2016-05-05 | Discharge: 2016-05-05 | Disposition: A | Discharge status: Home / Self Care | Payer: BC Managed Care – PPO | Source: Ambulatory Visit | Attending: Orthopedic Surgery | Admitting: Orthopedic Surgery

## 2016-05-05 DIAGNOSIS — F17210 Nicotine dependence, cigarettes, uncomplicated: Secondary | ICD-10-CM | POA: Diagnosis not present

## 2016-05-05 DIAGNOSIS — G629 Polyneuropathy, unspecified: Secondary | ICD-10-CM | POA: Diagnosis not present

## 2016-05-05 DIAGNOSIS — R262 Difficulty in walking, not elsewhere classified: Secondary | ICD-10-CM | POA: Diagnosis not present

## 2016-05-05 DIAGNOSIS — E039 Hypothyroidism, unspecified: Secondary | ICD-10-CM | POA: Diagnosis not present

## 2016-05-05 DIAGNOSIS — E7800 Pure hypercholesterolemia, unspecified: Secondary | ICD-10-CM | POA: Diagnosis not present

## 2016-05-05 DIAGNOSIS — M545 Low back pain: Secondary | ICD-10-CM | POA: Diagnosis not present

## 2016-05-05 DIAGNOSIS — G894 Chronic pain syndrome: Secondary | ICD-10-CM | POA: Diagnosis present

## 2016-05-05 HISTORY — DX: Hypothyroidism, unspecified: E03.9

## 2016-05-05 HISTORY — DX: Adverse effect of unspecified anesthetic, initial encounter: T41.45XA

## 2016-05-05 HISTORY — DX: Foot drop, unspecified foot: M21.379

## 2016-05-05 HISTORY — DX: Other complications of anesthesia, initial encounter: T88.59XA

## 2016-05-05 LAB — BASIC METABOLIC PANEL
Anion gap: 7 (ref 5–15)
BUN: 8 mg/dL (ref 6–20)
CALCIUM: 9.6 mg/dL (ref 8.9–10.3)
CHLORIDE: 106 mmol/L (ref 101–111)
CO2: 28 mmol/L (ref 22–32)
CREATININE: 0.81 mg/dL (ref 0.44–1.00)
Glucose, Bld: 119 mg/dL — ABNORMAL HIGH (ref 65–99)
Potassium: 3.9 mmol/L (ref 3.5–5.1)
SODIUM: 141 mmol/L (ref 135–145)

## 2016-05-05 LAB — CBC
HCT: 44.2 % (ref 36.0–46.0)
HEMOGLOBIN: 14.1 g/dL (ref 12.0–15.0)
MCH: 31.7 pg (ref 26.0–34.0)
MCHC: 31.9 g/dL (ref 30.0–36.0)
MCV: 99.3 fL (ref 78.0–100.0)
PLATELETS: 131 10*3/uL — AB (ref 150–400)
RBC: 4.45 MIL/uL (ref 3.87–5.11)
RDW: 13.6 % (ref 11.5–15.5)
WBC: 5.6 10*3/uL (ref 4.0–10.5)

## 2016-05-05 LAB — SURGICAL PCR SCREEN
MRSA, PCR: NEGATIVE
STAPHYLOCOCCUS AUREUS: NEGATIVE

## 2016-05-05 NOTE — Pre-Procedure Instructions (Signed)
    [name]  05/05/2016      [id number] - [place], [place] [place] [address] Phone: [phone] Fax: [phone]    Your procedure is scheduled on 05/07/16.  Report to [facility] Admitting at 9 A.M.  Call this number if you have problems the morning of surgery:  [phone]   Remember:  Do not eat food or drink liquids after midnight.  Take these medicines the morning of surgery with A SIP OF WATER --synthroid,oxycodone,lyrica   Do not wear jewelry, make-up or nail polish.  Do not wear lotions, powders, or perfumes.  You may wear deoderant.  Do not shave 48 hours prior to surgery.  Men may shave face and neck.  Do not bring valuables to the hospital.  [facility] is not responsible for any belongings or valuables.  Contacts, dentures or bridgework may not be worn into surgery.  Leave your suitcase in the car.  After surgery it may be brought to your room.  For patients admitted to the hospital, discharge time will be determined by your treatment team.  Patients discharged the day of surgery will not be allowed to drive home.   Name and phone number of your driver:    Special instructions: Do not take any aspirin,anti-inflammatories,vitamins,or herbal supplements 5-7 days prior to surgery.  Please read over the following fact sheets that you were given.

## 2016-05-07 ENCOUNTER — Encounter (HOSPITAL_COMMUNITY)
Admission: RE | Disposition: A | Discharge status: Home / Self Care | Payer: Self-pay | Source: Ambulatory Visit | Attending: Orthopedic Surgery

## 2016-05-07 ENCOUNTER — Ambulatory Visit (HOSPITAL_COMMUNITY): Payer: BC Managed Care – PPO | Admitting: Certified Registered"

## 2016-05-07 ENCOUNTER — Ambulatory Visit (HOSPITAL_COMMUNITY): Payer: BC Managed Care – PPO

## 2016-05-07 ENCOUNTER — Ambulatory Visit (HOSPITAL_COMMUNITY)
Admission: RE | Admit: 2016-05-07 | Discharge: 2016-05-08 | Disposition: A | Discharge status: Home / Self Care | Payer: BC Managed Care – PPO | Source: Ambulatory Visit | Attending: Orthopedic Surgery | Admitting: Orthopedic Surgery

## 2016-05-07 DIAGNOSIS — E7800 Pure hypercholesterolemia, unspecified: Secondary | ICD-10-CM | POA: Insufficient documentation

## 2016-05-07 DIAGNOSIS — E039 Hypothyroidism, unspecified: Secondary | ICD-10-CM | POA: Insufficient documentation

## 2016-05-07 DIAGNOSIS — Z419 Encounter for procedure for purposes other than remedying health state, unspecified: Secondary | ICD-10-CM

## 2016-05-07 DIAGNOSIS — K567 Ileus, unspecified: Secondary | ICD-10-CM

## 2016-05-07 DIAGNOSIS — G894 Chronic pain syndrome: Secondary | ICD-10-CM | POA: Diagnosis not present

## 2016-05-07 DIAGNOSIS — G629 Polyneuropathy, unspecified: Secondary | ICD-10-CM | POA: Insufficient documentation

## 2016-05-07 DIAGNOSIS — M545 Low back pain: Secondary | ICD-10-CM | POA: Insufficient documentation

## 2016-05-07 DIAGNOSIS — G8929 Other chronic pain: Secondary | ICD-10-CM | POA: Diagnosis present

## 2016-05-07 DIAGNOSIS — F17210 Nicotine dependence, cigarettes, uncomplicated: Secondary | ICD-10-CM | POA: Insufficient documentation

## 2016-05-07 DIAGNOSIS — R262 Difficulty in walking, not elsewhere classified: Secondary | ICD-10-CM | POA: Insufficient documentation

## 2016-05-07 HISTORY — PX: SPINAL CORD STIMULATOR INSERTION: SHX5378

## 2016-05-07 SURGERY — INSERTION, SPINAL CORD STIMULATOR, LUMBAR
Anesthesia: General | Site: Back

## 2016-05-07 MED ORDER — PREGABALIN 75 MG PO CAPS
150.0000 mg | ORAL_CAPSULE | Freq: Three times a day (TID) | ORAL | Status: DC
  Administered 2016-05-07 – 2016-05-08 (×3): 150 mg via ORAL
  Filled 2016-05-07 (×3): qty 2

## 2016-05-07 MED ORDER — LACTATED RINGERS IV SOLN: INTRAVENOUS | Status: DC

## 2016-05-07 MED ORDER — LACTATED RINGERS IV SOLN
INTRAVENOUS | Status: DC
  Administered 2016-05-07 (×2): via INTRAVENOUS

## 2016-05-07 MED ORDER — FENTANYL CITRATE (PF) 100 MCG/2ML IJ SOLN
INTRAMUSCULAR | Status: DC | PRN
  Administered 2016-05-07 (×5): 50 ug via INTRAVENOUS

## 2016-05-07 MED ORDER — BUPIVACAINE-EPINEPHRINE 0.25% -1:200000 IJ SOLN
INTRAMUSCULAR | Status: DC | PRN
  Administered 2016-05-07: 10 mL

## 2016-05-07 MED ORDER — PROPOFOL 10 MG/ML IV BOLUS
INTRAVENOUS | Status: DC | PRN
  Administered 2016-05-07: 200 mg via INTRAVENOUS

## 2016-05-07 MED ORDER — PHENYLEPHRINE HCL 10 MG/ML IJ SOLN
INTRAMUSCULAR | Status: DC | PRN
  Administered 2016-05-07: 40 ug via INTRAVENOUS
  Administered 2016-05-07 (×4): 80 ug via INTRAVENOUS
  Administered 2016-05-07: 120 ug via INTRAVENOUS
  Administered 2016-05-07: 40 ug via INTRAVENOUS
  Administered 2016-05-07 (×2): 80 ug via INTRAVENOUS

## 2016-05-07 MED ORDER — LIDOCAINE HCL (CARDIAC) 20 MG/ML IV SOLN
INTRAVENOUS | Status: DC | PRN
  Administered 2016-05-07: 80 mg via INTRAVENOUS

## 2016-05-07 MED ORDER — ROSUVASTATIN CALCIUM 10 MG PO TABS
10.0000 mg | ORAL_TABLET | Freq: Every day | ORAL | Status: DC
  Administered 2016-05-08: 10 mg via ORAL
  Filled 2016-05-07: qty 1

## 2016-05-07 MED ORDER — THROMBIN 20000 UNITS EX KIT
PACK | CUTANEOUS | Status: DC | PRN
  Administered 2016-05-07: 20 mL via TOPICAL

## 2016-05-07 MED ORDER — SODIUM CHLORIDE 0.9% FLUSH
3.0000 mL | Freq: Two times a day (BID) | INTRAVENOUS | Status: DC
  Administered 2016-05-07 (×2): 3 mL via INTRAVENOUS

## 2016-05-07 MED ORDER — VITAMIN D (ERGOCALCIFEROL) 1.25 MG (50000 UNIT) PO CAPS: 50000.0000 [IU] | ORAL_CAPSULE | ORAL | Status: DC

## 2016-05-07 MED ORDER — HYDROMORPHONE HCL 1 MG/ML IJ SOLN
0.2500 mg | INTRAMUSCULAR | Status: DC | PRN
  Administered 2016-05-07 (×4): 0.5 mg via INTRAVENOUS

## 2016-05-07 MED ORDER — OXYCODONE HCL 5 MG PO TABS
ORAL_TABLET | ORAL | Status: AC
  Filled 2016-05-07: qty 2

## 2016-05-07 MED ORDER — ONDANSETRON HCL 4 MG/2ML IJ SOLN
INTRAMUSCULAR | Status: DC | PRN
  Administered 2016-05-07 (×2): 4 mg via INTRAVENOUS

## 2016-05-07 MED ORDER — LEVOTHYROXINE SODIUM 25 MCG PO TABS
25.0000 ug | ORAL_TABLET | Freq: Every day | ORAL | Status: DC
  Administered 2016-05-08: 25 ug via ORAL
  Filled 2016-05-07: qty 1

## 2016-05-07 MED ORDER — DEXAMETHASONE SODIUM PHOSPHATE 4 MG/ML IJ SOLN: 4.0000 mg | Freq: Four times a day (QID) | INTRAMUSCULAR | Status: AC

## 2016-05-07 MED ORDER — CEFAZOLIN SODIUM-DEXTROSE 2-4 GM/100ML-% IV SOLN
2.0000 g | INTRAVENOUS | Status: AC
  Administered 2016-05-07: 2 g via INTRAVENOUS

## 2016-05-07 MED ORDER — ROCURONIUM BROMIDE 100 MG/10ML IV SOLN
INTRAVENOUS | Status: DC | PRN
  Administered 2016-05-07: 40 mg via INTRAVENOUS
  Administered 2016-05-07 (×3): 10 mg via INTRAVENOUS

## 2016-05-07 MED ORDER — THROMBIN 20000 UNITS EX SOLR
CUTANEOUS | Status: AC
  Filled 2016-05-07: qty 20000

## 2016-05-07 MED ORDER — PHENOL 1.4 % MT LIQD: 1.0000 | OROMUCOSAL | Status: DC | PRN

## 2016-05-07 MED ORDER — ONDANSETRON HCL 4 MG/2ML IJ SOLN
INTRAMUSCULAR | Status: AC
  Filled 2016-05-07: qty 4

## 2016-05-07 MED ORDER — ARTIFICIAL TEARS OP OINT
TOPICAL_OINTMENT | OPHTHALMIC | Status: AC
  Filled 2016-05-07: qty 3.5

## 2016-05-07 MED ORDER — BUPIVACAINE HCL (PF) 0.25 % IJ SOLN
INTRAMUSCULAR | Status: AC
  Filled 2016-05-07: qty 30

## 2016-05-07 MED ORDER — DEXAMETHASONE 4 MG PO TABS
4.0000 mg | ORAL_TABLET | Freq: Four times a day (QID) | ORAL | Status: AC
  Administered 2016-05-07 (×2): 4 mg via ORAL
  Filled 2016-05-07 (×2): qty 1

## 2016-05-07 MED ORDER — MIDAZOLAM HCL 5 MG/5ML IJ SOLN
INTRAMUSCULAR | Status: DC | PRN
  Administered 2016-05-07: 2 mg via INTRAVENOUS

## 2016-05-07 MED ORDER — CEFAZOLIN SODIUM-DEXTROSE 2-4 GM/100ML-% IV SOLN
INTRAVENOUS | Status: AC
  Filled 2016-05-07: qty 100

## 2016-05-07 MED ORDER — METHOCARBAMOL 500 MG PO TABS
500.0000 mg | ORAL_TABLET | Freq: Four times a day (QID) | ORAL | Status: DC | PRN
  Administered 2016-05-07 – 2016-05-08 (×3): 500 mg via ORAL
  Filled 2016-05-07 (×4): qty 1

## 2016-05-07 MED ORDER — ROCURONIUM BROMIDE 50 MG/5ML IV SOLN
INTRAVENOUS | Status: AC
  Filled 2016-05-07: qty 1

## 2016-05-07 MED ORDER — NALOXEGOL OXALATE 12.5 MG PO TABS
12.5000 mg | ORAL_TABLET | Freq: Every day | ORAL | Status: DC
  Filled 2016-05-07: qty 1

## 2016-05-07 MED ORDER — SUGAMMADEX SODIUM 200 MG/2ML IV SOLN
INTRAVENOUS | Status: AC
  Filled 2016-05-07: qty 2

## 2016-05-07 MED ORDER — SUGAMMADEX SODIUM 200 MG/2ML IV SOLN
INTRAVENOUS | Status: DC | PRN
  Administered 2016-05-07: 200 mg via INTRAVENOUS

## 2016-05-07 MED ORDER — HEMOSTATIC AGENTS (NO CHARGE) OPTIME
TOPICAL | Status: DC | PRN
  Administered 2016-05-07: 1 via TOPICAL

## 2016-05-07 MED ORDER — MIDAZOLAM HCL 2 MG/2ML IJ SOLN
INTRAMUSCULAR | Status: AC
  Filled 2016-05-07: qty 2

## 2016-05-07 MED ORDER — PHENYLEPHRINE 40 MCG/ML (10ML) SYRINGE FOR IV PUSH (FOR BLOOD PRESSURE SUPPORT)
PREFILLED_SYRINGE | INTRAVENOUS | Status: AC
  Filled 2016-05-07: qty 10

## 2016-05-07 MED ORDER — MENTHOL 3 MG MT LOZG: 1.0000 | LOZENGE | OROMUCOSAL | Status: DC | PRN

## 2016-05-07 MED ORDER — HYDROMORPHONE HCL 1 MG/ML IJ SOLN
INTRAMUSCULAR | Status: AC
  Administered 2016-05-07: 0.5 mg via INTRAVENOUS
  Filled 2016-05-07: qty 1

## 2016-05-07 MED ORDER — SODIUM CHLORIDE 0.9% FLUSH: 3.0000 mL | INTRAVENOUS | Status: DC | PRN

## 2016-05-07 MED ORDER — FENTANYL CITRATE (PF) 250 MCG/5ML IJ SOLN
INTRAMUSCULAR | Status: AC
  Filled 2016-05-07: qty 5

## 2016-05-07 MED ORDER — CEFAZOLIN IN D5W 1 GM/50ML IV SOLN
1.0000 g | Freq: Three times a day (TID) | INTRAVENOUS | Status: AC
  Administered 2016-05-07 – 2016-05-08 (×2): 1 g via INTRAVENOUS
  Filled 2016-05-07 (×2): qty 50

## 2016-05-07 MED ORDER — LIDOCAINE 2% (20 MG/ML) 5 ML SYRINGE
INTRAMUSCULAR | Status: AC
  Filled 2016-05-07: qty 5

## 2016-05-07 MED ORDER — DEXAMETHASONE SODIUM PHOSPHATE 10 MG/ML IJ SOLN
INTRAMUSCULAR | Status: DC | PRN
  Administered 2016-05-07: 10 mg via INTRAVENOUS

## 2016-05-07 MED ORDER — PHENYLEPHRINE HCL 10 MG/ML IJ SOLN
INTRAVENOUS | Status: DC | PRN
  Administered 2016-05-07: 20 ug/min via INTRAVENOUS

## 2016-05-07 MED ORDER — MORPHINE SULFATE (PF) 2 MG/ML IV SOLN
1.0000 mg | INTRAVENOUS | Status: DC | PRN
  Administered 2016-05-07 – 2016-05-08 (×2): 4 mg via INTRAVENOUS
  Filled 2016-05-07 (×2): qty 2

## 2016-05-07 MED ORDER — METHOCARBAMOL 500 MG PO TABS: 500.0000 mg | ORAL_TABLET | Freq: Three times a day (TID) | ORAL | 0 refills | Status: DC | PRN

## 2016-05-07 MED ORDER — METHOCARBAMOL 1000 MG/10ML IJ SOLN
500.0000 mg | Freq: Four times a day (QID) | INTRAVENOUS | Status: DC | PRN
  Administered 2016-05-07: 500 mg via INTRAVENOUS
  Filled 2016-05-07 (×2): qty 5

## 2016-05-07 MED ORDER — OXYCODONE-ACETAMINOPHEN 10-325 MG PO TABS: 1.0000 | ORAL_TABLET | ORAL | 0 refills | Status: DC | PRN

## 2016-05-07 MED ORDER — DEXAMETHASONE SODIUM PHOSPHATE 10 MG/ML IJ SOLN
INTRAMUSCULAR | Status: AC
  Filled 2016-05-07: qty 1

## 2016-05-07 MED ORDER — OXYCODONE HCL 5 MG PO TABS
10.0000 mg | ORAL_TABLET | ORAL | Status: DC | PRN
  Administered 2016-05-07 – 2016-05-08 (×6): 10 mg via ORAL
  Filled 2016-05-07 (×6): qty 2

## 2016-05-07 MED ORDER — ONDANSETRON HCL 4 MG PO TABS: 4.0000 mg | ORAL_TABLET | Freq: Three times a day (TID) | ORAL | 0 refills | Status: DC | PRN

## 2016-05-07 MED ORDER — PROPOFOL 10 MG/ML IV BOLUS
INTRAVENOUS | Status: AC
  Filled 2016-05-07: qty 20

## 2016-05-07 MED ORDER — ONDANSETRON HCL 4 MG/2ML IJ SOLN: 4.0000 mg | INTRAMUSCULAR | Status: DC | PRN

## 2016-05-07 MED ORDER — HYDROCHLOROTHIAZIDE 25 MG PO TABS
12.5000 mg | ORAL_TABLET | Freq: Every day | ORAL | Status: DC
  Administered 2016-05-07 – 2016-05-08 (×2): 12.5 mg via ORAL
  Filled 2016-05-07 (×2): qty 1

## 2016-05-07 MED ORDER — 0.9 % SODIUM CHLORIDE (POUR BTL) OPTIME
TOPICAL | Status: DC | PRN
  Administered 2016-05-07: 1000 mL

## 2016-05-07 SURGICAL SUPPLY — 59 items
CANISTER SUCTION 2500CC (MISCELLANEOUS) ×2 IMPLANT
CLSR STERI-STRIP ANTIMIC 1/2X4 (GAUZE/BANDAGES/DRESSINGS) ×3 IMPLANT
COVER PROBE W GEL 5X96 (DRAPES) IMPLANT
COVER SURGICAL LIGHT HANDLE (MISCELLANEOUS) ×2 IMPLANT
DRAPE C-ARM 42X72 X-RAY (DRAPES) ×2 IMPLANT
DRAPE C-ARMOR (DRAPES) ×2 IMPLANT
DRAPE INCISE IOBAN 85X60 (DRAPES) ×2 IMPLANT
DRAPE SURG 17X23 STRL (DRAPES) ×2 IMPLANT
DRAPE U-SHAPE 47X51 STRL (DRAPES) ×2 IMPLANT
DRSG AQUACEL AG ADV 3.5X 4 (GAUZE/BANDAGES/DRESSINGS) ×1 IMPLANT
DRSG AQUACEL AG ADV 3.5X 6 (GAUZE/BANDAGES/DRESSINGS) ×1 IMPLANT
DRSG AQUACEL AG ADV 3.5X10 (GAUZE/BANDAGES/DRESSINGS) ×1 IMPLANT
DURAPREP 26ML APPLICATOR (WOUND CARE) ×2 IMPLANT
ELECT BLADE 4.0 EZ CLEAN MEGAD (MISCELLANEOUS) ×2
ELECT CAUTERY BLADE 6.4 (BLADE) ×2 IMPLANT
ELECT PENCIL ROCKER SW 15FT (MISCELLANEOUS) ×2 IMPLANT
ELECT REM PT RETURN 9FT ADLT (ELECTROSURGICAL) ×2
ELECTRODE BLDE 4.0 EZ CLN MEGD (MISCELLANEOUS) ×1 IMPLANT
ELECTRODE REM PT RTRN 9FT ADLT (ELECTROSURGICAL) ×1 IMPLANT
GLOVE BIOGEL PI IND STRL 8.5 (GLOVE) ×1 IMPLANT
GLOVE BIOGEL PI INDICATOR 8.5 (GLOVE) ×1
GLOVE SS N UNI LF 8.5 STRL (GLOVE) ×4 IMPLANT
GOWN STRL REUS W/TWL 2XL LVL3 (GOWN DISPOSABLE) ×2 IMPLANT
KIT BASIN OR (CUSTOM PROCEDURE TRAY) ×2 IMPLANT
KIT ROOM TURNOVER OR (KITS) ×2 IMPLANT
LAMI NARROW PRIPOLE 16CH (Orthopedic Implant) ×1 IMPLANT
NDL SPNL 18GX3.5 QUINCKE PK (NEEDLE) ×3 IMPLANT
NDL SUT 6 .5 CRC .975X.05 MAYO (NEEDLE) ×1 IMPLANT
NEEDLE 22X1 1/2 (OR ONLY) (NEEDLE) ×2 IMPLANT
NEEDLE MAYO TAPER (NEEDLE) ×2
NEEDLE SPNL 18GX3.5 QUINCKE PK (NEEDLE) ×6 IMPLANT
NS IRRIG 1000ML POUR BTL (IV SOLUTION) ×2 IMPLANT
PACK LAMINECTOMY ORTHO (CUSTOM PROCEDURE TRAY) ×2 IMPLANT
PACK UNIVERSAL I (CUSTOM PROCEDURE TRAY) ×2 IMPLANT
PAD ARMBOARD 7.5X6 YLW CONV (MISCELLANEOUS) ×6 IMPLANT
PULSE GENERATOR PROCLAIM 7ELIT (Neuro Prosthesis/Implant) ×1 IMPLANT
SPATULA SILICONE BRAIN 10MM (Orthopedic Implant) ×1 IMPLANT
SPONGE LAP 4X18 X RAY DECT (DISPOSABLE) ×1 IMPLANT
SPONGE SURGIFOAM ABS GEL 100 (HEMOSTASIS) ×2 IMPLANT
STAPLER VISISTAT 35W (STAPLE) ×1 IMPLANT
SURGIFLO W/THROMBIN 8M KIT (HEMOSTASIS) ×2 IMPLANT
SUT BONE WAX W31G (SUTURE) ×2 IMPLANT
SUT FIBERWIRE #2 38 REV NDL BL (SUTURE) ×2
SUT MNCRL AB 3-0 PS2 18 (SUTURE) ×1 IMPLANT
SUT MON AB 3-0 SH 27 (SUTURE) ×2
SUT MON AB 3-0 SH27 (SUTURE) IMPLANT
SUT STRATAFIX 1PDS 45CM VIOLET (SUTURE) ×1 IMPLANT
SUT VIC AB 1 CT1 18XCR BRD 8 (SUTURE) ×1 IMPLANT
SUT VIC AB 1 CT1 27 (SUTURE)
SUT VIC AB 1 CT1 27XBRD ANBCTR (SUTURE) ×1 IMPLANT
SUT VIC AB 1 CT1 8-18 (SUTURE) ×4
SUT VIC AB 2-0 CT1 18 (SUTURE) ×3 IMPLANT
SUTURE FIBERWR#2 38 REV NDL BL (SUTURE) ×1 IMPLANT
SYR BULB IRRIGATION 50ML (SYRINGE) ×2 IMPLANT
SYR CONTROL 10ML LL (SYRINGE) ×2 IMPLANT
TOWEL OR 17X24 6PK STRL BLUE (TOWEL DISPOSABLE) ×3 IMPLANT
TOWEL OR 17X26 10 PK STRL BLUE (TOWEL DISPOSABLE) ×2 IMPLANT
TRAY FOLEY CATH 16FRSI W/METER (SET/KITS/TRAYS/PACK) IMPLANT
WATER STERILE IRR 1000ML POUR (IV SOLUTION) ×1 IMPLANT

## 2016-05-07 NOTE — Anesthesia Preprocedure Evaluation (Addendum)
Anesthesia Evaluation  Patient identified by MRN, date of birth, ID band Patient awake    Reviewed: Allergy & Precautions, NPO status , Patient's Chart, lab work & pertinent test results  Airway Mallampati: II  TM Distance: >3 FB Neck ROM: Full    Dental   Pulmonary Current Smoker,    breath sounds clear to auscultation       Cardiovascular negative cardio ROS   Rhythm:Regular Rate:Normal     Neuro/Psych  Neuromuscular disease negative neurological ROS  negative psych ROS   GI/Hepatic Neg liver ROS, GERD  ,  Endo/Other  Hypothyroidism   Renal/GU negative Renal ROS     Musculoskeletal negative musculoskeletal ROS (+) Arthritis ,   Abdominal   Peds  Hematology   Anesthesia Other Findings   Reproductive/Obstetrics                            Anesthesia Physical Anesthesia Plan  ASA: III  Anesthesia Plan: General   Post-op Pain Management:    Induction: Intravenous  Airway Management Planned:   Additional Equipment:   Intra-op Plan:   Post-operative Plan: Extubation in OR  Informed Consent: I have reviewed the patients History and Physical, chart, labs and discussed the procedure including the risks, benefits and alternatives for the proposed anesthesia with the patient or authorized representative who has indicated his/her understanding and acceptance.   Dental advisory given  Plan Discussed with: CRNA and Anesthesiologist  Anesthesia Plan Comments:         Anesthesia Quick Evaluation

## 2016-05-07 NOTE — Op Note (Signed)
[name], [name]                ACCOUNT NO.:  [id number]  MEDICAL RECORD NO.:  [id number]  LOCATION:  3C02C                        FACILITY:  [facility]  PHYSICIAN:  [name], M.D. DATE OF BIRTH:  12/18/1960  DATE OF PROCEDURE:  05/07/2016 DATE OF DISCHARGE:                              OPERATIVE REPORT   PREOPERATIVE DIAGNOSIS:  Chronic pain syndrome with peripheral neuropathy.  POSTOPERATIVE DIAGNOSIS:  Chronic pain syndrome with peripheral neuropathy.  OPERATIVE PROCEDURES:  Implantation of spinal cord stimulator.  IMPLANT USED:  Tripole by [employer]) with the large nonrechargeable battery.  COMPLICATIONS:  None.  CONDITION:  Stable.  HISTORY:  This is a very pleasant, 55 year old woman who has had chronic debilitating back, buttock, and leg pain.  Attempts at conservative management have failed to alleviate her symptoms and so she ultimately had a spinal cord stimulator trial placed.  The patient had 90% improvement in her neuropathic leg pain and so we elected to proceed with the permanent implant.  All appropriate risks, benefits, and alternatives were discussed with the patient.  Consent was obtained.  OPERATIVE NOTE:  The patient was brought to the operating room, placed supine on the operating table.  After successful induction of general anesthesia and endotracheal intubation, TEDs, SCDs were applied.  She was turned prone onto the [place] frame.  All bony prominences were well padded.  The back was prepped and draped in standard fashion.  Time-out was taken to confirm patient, procedure, and all other pertinent important data.  Using AP fluoroscopy, I identified the T12 spinous process and rib and then counted up to the T9-T10 level.  I confirmed with the rep that based on the trial images of the T12 rib bearing vertebrae, the maximum coverage was applied at the T8 level.  Given that I mapped out it from the T9-10 disk to the T10-11 disk.  The  incision was infiltrated with 0.25% Marcaine.  A midline incision was made.  Sharp dissection was carried out down to the deep fascia.  I stripped the paraspinal muscle to expose the spinous process at T9 and T10 bilaterally.  Hemostasis was obtained using bipolar electrocautery.  Self-retaining retractor was placed and then I confirmed the T9 pedicle and the T9-10 interspinous process space.  Once this was done, we used the double-action Leksell rongeur to remove the inferior portion of the T9 spinous process and then I used a 2 mm Kerrison punch to perform a laminotomy of T9.  I then used Penfield 4 to dissect through the central raphe of the ligamentum flavum and used my 2 mm Kerrison punch to remove the ligamentum flavum. At this point, I gently dissected through the epidural fat and exposed the dorsal surface of the thecal sac.  Once this was done, I then used my dural spatula to pass along the dorsal surface of the thecal sac. This was able to get to the T7-8 disk space without any significant issue or problem.  With adequate space, I obtained a Tripole lead and then advanced it to the T7-8 disk space level.  This spanned the entire T8 vertebral body which was exactly where the maximum amount of coverage was  obtained according to the read.  At this point, I then made 2 bone holes in the spinous process at T10 and secured the leads directly to the T10 spinous process using FiberWire.  I then made my incision for the battery.  Dissected down 4 cm and created a cavity.  I then used the submuscular passer to pass the wires from the thoracic wound to the gluteal wound.  I secured them to the battery and torqued them according to manufacture's standards.  We then tested the leads which were functioning without problem.  I then buried the leads underneath the battery and then secured the battery to the deep fascia with interrupted #1 Vicryl sutures.  All wounds were copiously irrigated with  normal saline.  Hemostasis was obtained using bipolar electrocautery and FloSeal.  I closed the deep layers of the thoracic wound, deep fascia with a #1 STRATAFIX and then a layer of #1 Vicryl suture, then 2-0 Vicryl sutures, and 3-0 Monocryl.  The other wound after it was irrigated out, I closed it with interrupted #1 Vicryl sutures, a layer 2- 0 Vicryl sutures, and 3-0 Monocryl.  Steri-Strips and dry dressing were applied and the patient was ultimately extubated, transferred to the PACU without incident.  At the end of the case, all needle and sponge counts were correct.  There were no adverse intraoperative events.     [name], M.D.     DDB/MEDQ  D:  05/07/2016  T:  05/07/2016  Job:  [id number]

## 2016-05-07 NOTE — Discharge Instructions (Signed)
Ok to shower in 5 days Do not remove dressing Call if you experience fevers, chills, numbness, weakness, or loss in control of bowel / bladder control

## 2016-05-07 NOTE — Evaluation (Signed)
Physical Therapy Evaluation Patient Details Name: [name] MRN: [id number] DOB: 07-06-1961 Today's Date: 05/07/2016   History of Present Illness  Pt is a 55 y/o F with PMH of chronic back pain now s/p Lumbar spinal cord stimulator insertion on 05/07/16.    Clinical Impression  Pt admitted with above diagnosis. Pt currently with functional limitations due to the deficits listed below (see PT Problem List). Ms. Savary presents with 10/10 back pain with sit>stand transfers, requiring min guard assist.  She demonstrates a very guarded posture but was able to ambulate 300 ft with min guard for safety.  She will have 24/7 assist/supervision available from her husband at d/c.  PTA pt Ind with all ADLs, IADLs, and ambulating without AD.  She will need to complete stair training prior to d/c. Pt will benefit from skilled PT to increase their independence and safety with mobility to allow discharge to the venue listed below.      Follow Up Recommendations No PT follow up;Supervision for mobility/OOB    Equipment Recommendations  3in1 (PT)    Recommendations for Other Services OT consult     Precautions / Restrictions Precautions Precautions: Fall Precaution Comments: Instructed pt to avoid twisting, bending, arching to reduce stress on surgical site Restrictions Weight Bearing Restrictions: No      Mobility  Bed Mobility Overal bed mobility: Needs Assistance Bed Mobility: Rolling;Sidelying to Sit;Sit to Sidelying Rolling: Min guard Sidelying to sit: Min guard;HOB elevated     Sit to sidelying: Min guard;HOB elevated General bed mobility comments: HOB elevated to ~20 deg per pt request as she typically has pillows built up at home.  No use of rail.  Cues for log roll which pt reports she has already been doing at home.  Pt does have difficulty bringing LEs into bed with log roll to return to supine and instead scoots back on bed, brings LEs up onto bed and pivots  buttocks.  Transfers Overall transfer level: Needs assistance Equipment used: None Transfers: Sit to/from Stand Sit to Stand: From elevated surface;Min guard         General transfer comment: 10/10 pain to push up to standing and bed elevated to assist.    Ambulation/Gait Ambulation/Gait assistance: Min guard Ambulation Distance (Feet): 300 Feet Assistive device: None Gait Pattern/deviations: Step-through pattern;Decreased stride length Gait velocity: decreased   General Gait Details: Very guarded posture and decreased gait speed.  Min guard for safety.  Stairs            Wheelchair Mobility    Modified Rankin (Stroke Patients Only)       Balance Overall balance assessment: Needs assistance Sitting-balance support: No upper extremity supported;Feet supported Sitting balance-Leahy Scale: Good     Standing balance support: No upper extremity supported;During functional activity Standing balance-Leahy Scale: Good                               Pertinent Vitals/Pain Pain Assessment: 0-10 Pain Score: 10-Worst pain ever Pain Location: incision sites, Lt hip Pain Descriptors / Indicators: Crying;Guarding;Constant;Sharp Pain Intervention(s): Limited activity within patient's tolerance;Monitored during session;Repositioned;Premedicated before session    Home Living Family/patient expects to be discharged to:: Private residence Living Arrangements: Spouse/significant other;Children Available Help at Discharge: Family;Available 24 hours/day (Husband works from home) Type of Home: House Home Access: Stairs to enter Entrance Stairs-Rails: Right;Left (See additional comments below) Entrance Stairs-Number of Steps: flight Home Layout: Two level;Able to live on  main level with bedroom/bathroom Home Equipment: Crutches Additional Comments: Pt has a flight from garage to main floor of home with Rt rail halfway and Lt rail the second half.  Or she could do 2  steps at the front of her house with no rails.  She also has 2 steps without rails inside her home to get to her study.    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  She does all ADLs, IADLs independently and ambulates without AD.     Hand Dominance   Dominant Hand: Right    Extremity/Trunk Assessment   Upper Extremity Assessment: Overall WFL for tasks assessed (although guarded and limited due to pain)           Lower Extremity Assessment: Overall WFL for tasks assessed      Cervical / Trunk Assessment: Other exceptions  Communication   Communication: No difficulties  Cognition Arousal/Alertness: Awake/alert Behavior During Therapy: WFL for tasks assessed/performed Overall Cognitive Status: Within Functional Limits for tasks assessed                      General Comments      Exercises        Assessment/Plan    PT Assessment Patient needs continued PT services  PT Diagnosis Difficulty walking;Acute pain   PT Problem List Decreased range of motion;Decreased activity tolerance;Decreased balance;Pain  PT Treatment Interventions DME instruction;Gait training;Stair training;Functional mobility training;Therapeutic activities;Therapeutic exercise;Balance training;Neuromuscular re-education;Patient/family education;Modalities   PT Goals (Current goals can be found in the Care Plan section) Acute Rehab PT Goals Patient Stated Goal: decreased pain PT Goal Formulation: With patient Time For Goal Achievement: 05/14/16 Potential to Achieve Goals: Good    Frequency Min 5X/week   Barriers to discharge Inaccessible home environment Steps to enter home    Co-evaluation               End of Session Equipment Utilized During Treatment: Other (comment) (unable to use gait belt with surgical sites) Activity Tolerance: Patient tolerated treatment  well;Patient limited by pain Patient left: in bed;with call bell/phone within reach;with SCD's reapplied Nurse Communication: Mobility status    Functional Assessment Tool Used: Clinical Judgement Functional Limitation: Mobility: Walking and moving around Mobility: Walking and Moving Around Current Status [id number]): At least 1 percent but less than 20 percent impaired, limited or restricted Mobility: Walking and Moving Around Goal Status [phone]): At least 1 percent but less than 20 percent impaired, limited or restricted    Time: 1541-1611 PT Time Calculation (min) (ACUTE ONLY): 30 min   Charges:   PT Evaluation $PT Eval Low Complexity: 1 Procedure PT Treatments $Gait Training: 8-22 mins   PT G Codes:   PT G-Codes **NOT FOR INPATIENT CLASS** Functional Assessment Tool Used: Clinical Judgement Functional Limitation: Mobility: Walking and moving around Mobility: Walking and Moving Around Current Status [id number]): At least 1 percent but less than 20 percent impaired, limited or restricted Mobility: Walking and Moving Around Goal Status [phone]): At least 1 percent but less than 20 percent impaired, limited or restricted   Collie Siad PT, DPT  Pager: [phone] Phone: [phone] 05/07/2016, 4:49 PM

## 2016-05-07 NOTE — Anesthesia Procedure Notes (Signed)
Procedure Name: Intubation Date/Time: 05/07/2016 10:55 AM Performed by: Freddie Breech Pre-anesthesia Checklist: Patient identified, Emergency Drugs available, Suction available and Patient being monitored Patient Re-evaluated:Patient Re-evaluated prior to inductionOxygen Delivery Method: Circle System Utilized Preoxygenation: Pre-oxygenation with 100% oxygen Intubation Type: IV induction Ventilation: Mask ventilation without difficulty and Oral airway inserted - appropriate to patient size Laryngoscope Size: Mac and 3 Grade View: Grade II Tube type: Oral Tube size: 7.0 mm Number of attempts: 1 Airway Equipment and Method: Stylet and Oral airway Placement Confirmation: ETT inserted through vocal cords under direct vision,  positive ETCO2 and breath sounds checked- equal and bilateral Secured at: 22 cm Tube secured with: Tape Dental Injury: Teeth and Oropharynx as per pre-operative assessment

## 2016-05-07 NOTE — Transfer of Care (Deleted)
Immediate Anesthesia Transfer of Care Note  Patient: [name]  Procedure(s) Performed: Procedure(s): LUMBAR SPINAL CORD STIMULATOR INSERTION (N/A)  Patient Location: PACU  Anesthesia Type:General  Level of Consciousness:  sedated, patient cooperative and responds to stimulation  Airway & Oxygen Therapy:Patient Spontanous Breathing and Patient connected to face mask oxgen  Post-op Assessment:  Report given to PACU RN and Post -op Vital signs reviewed and stable  Post vital signs:  Reviewed and stable  Last Vitals:  Vitals:   05/07/16 0916  BP: (!) 100/48  Pulse: 75  Resp: 18  Temp: 123XX123 C    Complications: No apparent anesthesia complications

## 2016-05-07 NOTE — Transfer of Care (Signed)
Immediate Anesthesia Transfer of Care Note  Patient: [name]  Procedure(s) Performed: Procedure(s): LUMBAR SPINAL CORD STIMULATOR INSERTION (N/A)  Patient Location: PACU  Anesthesia Type:General  Level of Consciousness:  sedated, patient cooperative and responds to stimulation  Airway & Oxygen Therapy:Patient Spontanous Breathing and Patient connected to face mask oxgen  Post-op Assessment:  Report given to PACU RN and Post -op Vital signs reviewed and stable  Post vital signs:  Reviewed and stable  Last Vitals:  Vitals:   05/07/16 0916  BP: (!) 100/48  Pulse: 75  Resp: 18  Temp: 123XX123 C    Complications: No apparent anesthesia complications

## 2016-05-07 NOTE — H&P (Signed)
History of Present Illness The patient is a 55 year old female who comes in today for a preoperative History and Physical. The patient is scheduled for a Spinal Cord Stimulator Implantation to be performed by Dr. Duane Lope D. Rolena Infante, MD at [facility] on 05/07/16 . Please see the hospital record for complete dictated history and physical. Note for "H & P": The patient just had thoracic MRI this morning. Pt reports smoking a pack a day during the summer and a 1/2 pack a day during the school year. Pt has liver sclerosis. Strongly advised the pt to consider smoking cessation.    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 Sulfamethoxazole *SULFONAMIDES*  Nausea. 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 Movantik (25MG  Tablet, 1 (one) Oral 1 hour before breakfast, Taken starting 04/07/2016) Active. Pennsaid (2% Solution, 2 (two) Pump Transdermal 2 times daily as needed for pain, Taken starting 03/26/2016) Active. Cyanocobalamin (1000MCG/ML Solution, Intramuscular) Active. HydroCHLOROthiazide (12.5MG  Tablet, Oral) Active. Vitamin D2 (Oral weekly)  Specific strength unknown - Active. TiZANidine HCl (4MG  Tablet, Oral) Active. Folic Acid (1MG  Tablet, Oral) Active. Rosuvastatin Calcium (10MG  Tablet, Oral) Active. Lyrica (150MG  Capsule, Oral) Active. Synthroid (25MCG Tablet, Oral) Active. Medications Reconciled  Other Problems  Anemia  Cancer  Chronic Pain  Hypercholesterolemia  Liver disease  Peripheral Neuropathy   Vitals  05/05/2016 11:05 AM Weight: 240 lb Height: 69in Body Surface Area: 2.23 m Body Mass Index: 35.44 kg/m  Temp.: 98.2F(Oral)  Pulse: 74 (Regular)  BP: 151/77 (Sitting, Left Arm, Standard)  General General Appearance-Not in acute distress. Orientation-Oriented X3. Build & Nutrition-Well nourished and Well developed.  Integumentary General Characteristics Surgical Scars - no surgical scar evidence of previous lumbar surgery. Lumbar Spine-Skin examination of the lumbar spine is without deformity, skin lesions, lacerations or abrasions.  Chest and Lung Exam Auscultation Breath sounds - Normal and Clear.  Cardiovascular Auscultation Rhythm - Regular rate and rhythm.  Abdomen Palpation/Percussion Palpation and Percussion of the abdomen reveal - Soft, Non Tender and No Rebound tenderness.  Peripheral Vascular Lower Extremity Palpation - Posterior tibial pulse - Bilateral - 2+. Dorsalis pedis pulse - Bilateral - 2+.  Neurologic Sensation Lower Extremity - Bilateral - sensation is intact in the lower extremity. Reflexes Patellar Reflex - Bilateral - 2+. Achilles Reflex - Bilateral - 2+. Clonus - Bilateral - clonus not present. Hoffman's Sign - Bilateral - Hoffman's sign not present. Testing Seated Straight Leg Raise - Bilateral - Seated straight leg raise negative.  Musculoskeletal Spine/Ribs/Pelvis  Lumbosacral Spine: Inspection and Palpation - Tenderness - left lumbar paraspinals tender to palpation and right lumbar paraspinals tender to palpation. Strength  and Tone: Strength - Hip Flexion - Bilateral - 5/5. Knee Extension -  Bilateral - 5/5. Knee Flexion - Bilateral - 5/5. Ankle Dorsiflexion - Bilateral - 5/5. Ankle Plantarflexion - Bilateral - 5/5. Heel walk - Bilateral - able to heel walk without difficulty. Toe Walk - Bilateral - able to walk on toes without difficulty. Heel-Toe Walk - Bilateral - able to heel-toe walk without difficulty. ROM - Flexion - mildly decreased range of motion and painful. Extension - mildly decreased range of motion and painful. Left Lateral Bending - mildly decreased range of motion and painful. Right Lateral Bending - mildly decreased range of motion and painful. Right Rotation - mildly decreased range of motion and painful. Left Rotation - mildly decreased range of motion and painful. Pain - . Lumbosacral Spine - Waddell's Signs - no Waddell's signs present. Lower Extremity Range of Motion - No true hip, knee or ankle pain with range of motion. Gait and Station - [employer] - no assistive devices.  Thoracic MRI: no contra indication to placemetn of SCS lead.  No significant spinal stenosis at intended level of placement.  Plan: Patient with chronic leg and foot pain.  Patient got 90% relief of neuropathic leg/foot pain during her trial period.  Plan on definitive placement of SCS today. Risks reviewed: infection, bleeding, nerve damage, death, stroke, paralysis, migration of the lead, blood clots, ongoing or worse pain, need for further surgery, hardware failure.  All questions addressed

## 2016-05-07 NOTE — Anesthesia Postprocedure Evaluation (Signed)
Anesthesia Post Note  Patient: [name]  Procedure(s) Performed: Procedure(s) (LRB): LUMBAR SPINAL CORD STIMULATOR INSERTION (N/A)  Patient location during evaluation: PACU Anesthesia Type: General Level of consciousness: awake Vital Signs Assessment: post-procedure vital signs reviewed and stable Respiratory status: spontaneous breathing Cardiovascular status: stable Anesthetic complications: no    Last Vitals:  Vitals:   05/07/16 0916  BP: (!) 100/48  Pulse: 75  Resp: 18  Temp: 36.6 C    Last Pain:  Vitals:   05/07/16 0916  TempSrc: Oral                 EDWARDS,Anique Beckley

## 2016-05-07 NOTE — Brief Op Note (Signed)
05/07/2016  1:04 PM  PATIENT:  [name]  55 y.o. female  PRE-OPERATIVE DIAGNOSIS:  CHRONIC LUMBER BACK PAIN  POST-OPERATIVE DIAGNOSIS:  CHRONIC LUMBER BACK PAIN  PROCEDURE:  Procedure(s): LUMBAR SPINAL CORD STIMULATOR INSERTION (N/A)  SURGEON:  Surgeon(s) and Role:    * Melina Schools, MD - Primary  PHYSICIAN ASSISTANT:   ASSISTANTS: none   ANESTHESIA:   general  EBL:  Total I/O In: 1000 [I.V.:1000] Out: -   BLOOD ADMINISTERED:none  DRAINS: none   LOCAL MEDICATIONS USED:  MARCAINE     SPECIMEN:  No Specimen  DISPOSITION OF SPECIMEN:  N/A  COUNTS:  YES  TOURNIQUET:  * No tourniquets in log *  DICTATION: .Other Dictation: Dictation Number [id number]  PLAN OF CARE: Admit for overnight observation  PATIENT DISPOSITION:  PACU - hemodynamically stable.

## 2016-05-08 ENCOUNTER — Encounter (HOSPITAL_COMMUNITY): Payer: Self-pay | Admitting: Student

## 2016-05-08 ENCOUNTER — Ambulatory Visit (HOSPITAL_COMMUNITY): Payer: BC Managed Care – PPO

## 2016-05-08 DIAGNOSIS — G894 Chronic pain syndrome: Secondary | ICD-10-CM | POA: Diagnosis not present

## 2016-05-08 MED ORDER — MAGNESIUM CITRATE PO SOLN
1.0000 | Freq: Once | ORAL | Status: AC
  Administered 2016-05-08: 1 via ORAL
  Filled 2016-05-08: qty 296

## 2016-05-08 MED ORDER — NALOXEGOL OXALATE 25 MG PO TABS
25.0000 mg | ORAL_TABLET | Freq: Every day | ORAL | Status: DC
  Administered 2016-05-08: 25 mg via ORAL
  Filled 2016-05-08: qty 1

## 2016-05-08 MED ORDER — ALUM & MAG HYDROXIDE-SIMETH 200-200-20 MG/5ML PO SUSP
30.0000 mL | Freq: Four times a day (QID) | ORAL | Status: DC | PRN
  Administered 2016-05-08: 30 mL via ORAL

## 2016-05-08 MED FILL — Thrombin For Soln 20000 Unit: CUTANEOUS | Qty: 1 | Status: AC

## 2016-05-08 NOTE — Progress Notes (Signed)
Patient ID: [name], female   DOB: 04-May-1961, 56 y.o.   MRN: [id number]    Subjective: 1 Day Post-Op Procedure(s) (LRB): LUMBAR SPINAL CORD STIMULATOR INSERTION (N/A) Patient reports pain as 6 on 0-10 scale.   Denies CP or SOB.  Voiding without difficulty. Positive flatus. Pt reports Movantik is a home med for her. Burman Nieves has already been here to turn on SCS. Objective: Vital signs in last 24 hours: Temp:  [97 F (36.1 C)-99.1 F (37.3 C)] 98.2 F (36.8 C) (08/03 0406) Pulse Rate:  [62-86] 86 (08/03 0406) Resp:  [16-20] 20 (08/03 0406) BP: (100-127)/(48-75) 116/75 (08/03 0406) SpO2:  [94 %-100 %] 94 % (08/03 0406) Weight:  [109.9 kg (242 lb 4 oz)] 109.9 kg (242 lb 4 oz) (08/02 0858)  Intake/Output from previous day: 08/02 0701 - 08/03 0700 In: 1960 [P.O.:360; I.V.:1600] Out: 75 [Blood:75] Intake/Output this shift: No intake/output data recorded.  Labs:  Recent Labs  05/05/16 0953  HGB 14.1    Recent Labs  05/05/16 0953  WBC 5.6  RBC 4.45  HCT 44.2  PLT 131*    Recent Labs  05/05/16 0953  NA 141  K 3.9  CL 106  CO2 28  BUN 8  CREATININE 0.81  GLUCOSE 119*  CALCIUM 9.6   No results for input(s): LABPT, INR in the last 72 hours.  Physical Exam: Neurologically intact ABD soft Sensation intact distally Incision: no drainage Compartment soft  Assessment/Plan: 1 Day Post-Op Procedure(s) (LRB): LUMBAR SPINAL CORD STIMULATOR INSERTION (N/A) Advance diet Up with therapy  Pt reports Abd discomfort wanted her home medication movantik added - did so. Consider DC today  Mayo, Darla Lesches for Dr. Melina Schools [facility] [phone] 05/08/2016, 7:11 AM

## 2016-05-08 NOTE — Progress Notes (Signed)
Physical Therapy Treatment Patient Details Name: [name] MRN: [id number] DOB: [birth year] Today's Date: 05/08/2016    History of Present Illness Pt is a 55 y/o F with PMH of chronic back pain now s/p Lumbar spinal cord stimulator insertion on 05/07/16.      PT Comments    Patient seen for mobility progression and stair negotiation. Patient with intermittent muscle spasms impeding mobility today, but patient was able to perform ambulation and stair negotiation. Discussed with patient technique for car transfers. Will continue to see and progress as tolerated.   Follow Up Recommendations  No PT follow up;Supervision for mobility/OOB     Equipment Recommendations  3in1 (PT)    Recommendations for Other Services OT consult     Precautions / Restrictions Precautions Precautions: Fall Precaution Comments: Instructed pt to avoid twisting, bending, arching to reduce stress on surgical site Restrictions Weight Bearing Restrictions: No    Mobility  Bed Mobility               General bed mobility comments: sitting in recliner  Transfers Overall transfer level: Needs assistance Equipment used: None Transfers: Sit to/from Stand Sit to Stand: Supervision         General transfer comment: Supervision for safety. Limited by muscle spasms. Cued for positioning when elevating to standing from low surface height/  Ambulation/Gait Ambulation/Gait assistance: Supervision Ambulation Distance (Feet): 240 Feet Assistive device: None Gait Pattern/deviations: Step-through pattern;Decreased stride length Gait velocity: decreased Gait velocity interpretation: Below normal speed for age/gender General Gait Details: intermittent stops due to muscle spasms   Stairs Stairs: Yes Stairs assistance: Supervision Stair Management: Two rails Number of Stairs: 6 General stair comments: increased time and effort to perform, no physical assist required, cued for sequencing  Wheelchair  Mobility    Modified Rankin (Stroke Patients Only)       Balance Overall balance assessment: Needs assistance Sitting-balance support: Feet supported;No upper extremity supported Sitting balance-Leahy Scale: Good     Standing balance support: No upper extremity supported;During functional activity Standing balance-Leahy Scale: Good                      Cognition Arousal/Alertness: Awake/alert Behavior During Therapy: WFL for tasks assessed/performed Overall Cognitive Status: Within Functional Limits for tasks assessed                      Exercises      General Comments        Pertinent Vitals/Pain Pain Assessment: 0-10 Pain Score: 8  Pain Location: back, muscle spasms Pain Descriptors / Indicators: Aching;Grimacing;Guarding;Spasm Pain Intervention(s): Monitored during session;Repositioned;Ice applied    Home Living Family/patient expects to be discharged to:: Private residence Living Arrangements: Spouse/significant other;Children Available Help at Discharge: Family;Available 24 hours/day Type of Home: House Home Access: Stairs to enter   Home Layout: Multi-level;Able to live on main level with bedroom/bathroom Home Equipment: Crutches      Prior Function Level of Independence: Independent      Comments: 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  She does all ADLs, IADLs independently and ambulates without AD.   PT Goals (current goals can now be found in the care plan section) Acute Rehab PT Goals Patient Stated Goal: decreased pain PT Goal Formulation: With patient Time For Goal Achievement: 05/14/16 Potential to Achieve Goals: Good Progress towards PT goals: Progressing toward goals    Frequency  Min 5X/week    PT  Plan Current plan remains appropriate    Co-evaluation             End of Session Equipment Utilized During Treatment: Other (comment) (unable to use gait belt with surgical  sites) Activity Tolerance: Patient tolerated treatment well;Patient limited by pain Patient left: in chair;with call bell/phone within reach     Time: 0801-0820 PT Time Calculation (min) (ACUTE ONLY): 19 min  Charges:  $Gait Training: 8-22 mins                    G CodesDuncan Dull 29-May-2016, 9:39 AM Alben Deeds, PT DPT  [phone]

## 2016-05-08 NOTE — Progress Notes (Signed)
Patient have been c/o abdominal distention and cramping, faint  bowel sounds and no flatus. PRN meds given as ordered and patient have been ambulating in hallway but no improvements on symptoms. PA on call notified no new orders received. Will continue to monitor patient.

## 2016-05-08 NOTE — Progress Notes (Signed)
Pt. discharged home accompanied by husband. Prescriptions and discharge instructions given with verbalization of understanding. Incision site on back with no s/s of infection - no swelling, redness, bleeding, and/or drainage noted. Opportunity given to ask questions but no question asked. Pt. transported out of this unit in wheelchair by the volunteer   

## 2016-05-08 NOTE — Evaluation (Signed)
Occupational Therapy Evaluation and Discharge Patient Details Name: [name] MRN: [id number] DOB: 1961-07-26 Today's Date: 05/08/2016    History of Present Illness Pt is a 55 y/o F with PMH of chronic back pain now s/p Lumbar spinal cord stimulator insertion on 05/07/16.     Clinical Impression   Pt reports she was independent with ADL PTA. Currently pt overall supervision for safety with functional mobility and min-mod assist with ADL. Educated pt on back precautions for comfort; pt with difficulty maintaining during functional activities due to pain. Pt planning to d/c home with 24/7 supervision from her husband who can assist with ADL as needed. No further acute OT needs identified, signing off at this time. Please re-consult if needs change. Thank you for this referral.    Follow Up Recommendations  No OT follow up;Supervision/Assistance - 24 hour    Equipment Recommendations  3 in 1 bedside comode    Recommendations for Other Services       Precautions / Restrictions Precautions Precautions: Fall Precaution Comments: Instructed pt to avoid twisting, bending, arching to reduce stress on surgical site Restrictions Weight Bearing Restrictions: No      Mobility Bed Mobility               General bed mobility comments: Pt sitting EOB upon arrival.  Transfers Overall transfer level: Needs assistance Equipment used: None Transfers: Sit to/from Stand Sit to Stand: Supervision         General transfer comment: Supervision for safety. Pt with wide base of support and forward flexed posture when coming up to standing. Educated pt on upright position but pt reports it is too painful.    Balance Overall balance assessment: Needs assistance Sitting-balance support: Feet supported;No upper extremity supported Sitting balance-Leahy Scale: Good     Standing balance support: No upper extremity supported;During functional activity Standing balance-Leahy Scale: Good                               ADL Overall ADL's : Needs assistance/impaired Eating/Feeding: Independent;Sitting   Grooming: Supervision/safety;Standing   Upper Body Bathing: Minimal assitance;Sitting   Lower Body Bathing: Moderate assistance;Sit to/from stand   Upper Body Dressing : Minimal assistance;Sitting   Lower Body Dressing: Moderate assistance;Sit to/from stand Lower Body Dressing Details (indicate cue type and reason): Pt unable to cross foot over opposite knee. Pt reports husband can assist with ADL as needed. Toilet Transfer: Supervision/safety;Ambulation;BSC Toilet Transfer Details (indicate cue type and reason): Simulated by sit to stand from EOB with functional mobility. Toileting- Clothing Manipulation and Hygiene: Supervision/safety;Sit to/from stand   Tub/ Shower Transfer: Supervision/safety;Walk-in shower;Ambulation;3 in 1 Tub/Shower Transfer Details (indicate cue type and reason): Educated pt on use of 3 in 1 in shower and over toilet. Functional mobility during ADLs: Supervision/safety General ADL Comments: Pt in significant pain which she reports she can only get relief from with arching. Educated pt on maintaining back precautions for comfort and healing.     Vision Vision Assessment?: No apparent visual deficits   Perception     Praxis      Pertinent Vitals/Pain Pain Assessment: 0-10 Pain Score: 8  Pain Location: back, muscle spasms Pain Descriptors / Indicators: Aching;Grimacing;Guarding;Spasm Pain Intervention(s): Monitored during session;Repositioned;Ice applied     Hand Dominance Right   Extremity/Trunk Assessment Upper Extremity Assessment Upper Extremity Assessment: Overall WFL for tasks assessed   Lower Extremity Assessment Lower Extremity Assessment: Defer to PT evaluation  Cervical / Trunk Assessment Cervical / Trunk Assessment: Other exceptions Cervical / Trunk Exceptions: s/p surgery documented above as well as chronic  back pain   Communication Communication Communication: No difficulties   Cognition Arousal/Alertness: Awake/alert Behavior During Therapy: WFL for tasks assessed/performed Overall Cognitive Status: Within Functional Limits for tasks assessed                     General Comments       Exercises       Shoulder Instructions      Home Living Family/patient expects to be discharged to:: Private residence Living Arrangements: Spouse/significant other;Children Available Help at Discharge: Family;Available 24 hours/day Type of Home: House Home Access: Stairs to enter [employer] of Steps: 2 in front, flight in back   Home Layout: Multi-level;Able to live on main level with bedroom/bathroom     Bathroom Shower/Tub: Occupational psychologist: Standard     Home Equipment: Crutches          Prior Functioning/Environment Level of Independence: Independent        Comments: 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  She does all ADLs, IADLs independently and ambulates without AD.    OT Diagnosis: Acute pain   OT Problem List:     OT Treatment/Interventions:      OT Goals(Current goals can be found in the care plan section) Acute Rehab OT Goals Patient Stated Goal: decreased pain OT Goal Formulation: All assessment and education complete, DC therapy  OT Frequency:     Barriers to D/C:            Co-evaluation              End of Session Nurse Communication: Other (comment) (needs 3 in 1 for home)  Activity Tolerance: Patient tolerated treatment well;Patient limited by pain Patient left: in chair;with call bell/phone within reach   Time: [id number] OT Time Calculation (min): 14 min Charges:  OT General Charges $OT Visit: 1 Procedure OT Evaluation $OT Eval Moderate Complexity: 1 Procedure G-Codes: OT G-codes **NOT FOR INPATIENT CLASS** Functional Assessment Tool Used: Clinical  judgement Functional Limitation: Self care Self Care Current Status [id number]): At least 20 percent but less than 40 percent impaired, limited or restricted Self Care Goal Status [id number]): At least 20 percent but less than 40 percent impaired, limited or restricted Self Care Discharge Status [phone]): At least 20 percent but less than 40 percent impaired, limited or restricted   Binnie Kand M.S., OTR/L Pager: [phone]  05/08/2016, 8:01 AM

## 2016-05-12 ENCOUNTER — Ambulatory Visit
Admission: RE | Admit: 2016-05-12 | Discharge: 2016-05-12 | Disposition: A | Discharge status: Home / Self Care | Payer: BC Managed Care – PPO | Source: Ambulatory Visit | Attending: Orthopedic Surgery | Admitting: Orthopedic Surgery

## 2016-05-12 ENCOUNTER — Other Ambulatory Visit: Payer: Self-pay | Admitting: Orthopedic Surgery

## 2016-05-12 DIAGNOSIS — Z9889 Other specified postprocedural states: Secondary | ICD-10-CM

## 2016-05-13 ENCOUNTER — Encounter (HOSPITAL_COMMUNITY): Payer: Self-pay | Admitting: Orthopedic Surgery

## 2016-05-14 ENCOUNTER — Encounter (HOSPITAL_COMMUNITY): Payer: Self-pay | Admitting: Orthopedic Surgery

## 2016-06-17 ENCOUNTER — Encounter: Payer: Self-pay | Admitting: Gastroenterology

## 2016-09-10 ENCOUNTER — Telehealth: Payer: Self-pay | Admitting: *Deleted

## 2016-09-10 NOTE — Telephone Encounter (Signed)
Dr Silverio Decamp, This pt is scheduled for a colon with you 12-28 Thursday.  Her last procedure was with Dr Olevia Perches 01-03-2009 and she had 2 HP polyps and tics.   Dr Carlean Purl has sent [place] clarifications on recalls and with [facility] states 10 years. I know the recall assessment sheet scanned in says hx colon polyps.  Do you want to proceed with that diagnosis or wait 10 years?  Please advise,  Marijean Niemann

## 2016-09-12 NOTE — Telephone Encounter (Signed)
Thanks, will proceed as scheduled Marie PV  

## 2016-09-12 NOTE — Telephone Encounter (Signed)
The polyps were called hyperplastic on pathology report but one of them was larger in size 15 mm,  we should go ahead with the colonoscopy as scheduled with h/o colon polyps. Thanks

## 2016-09-18 ENCOUNTER — Ambulatory Visit (AMBULATORY_SURGERY_CENTER): Payer: Self-pay | Admitting: *Deleted

## 2016-09-18 VITALS — Ht 69.0 in | Wt 244.0 lb

## 2016-09-18 DIAGNOSIS — Z86010 Personal history of colonic polyps: Secondary | ICD-10-CM

## 2016-09-18 MED ORDER — NA SULFATE-K SULFATE-MG SULF 17.5-3.13-1.6 GM/177ML PO SOLN: 1.0000 | Freq: Once | ORAL | 0 refills | Status: AC

## 2016-09-18 NOTE — Progress Notes (Signed)
No egg or soy allergy known to patient   issues with past sedation with any surgeries or procedures- pt states slow to wake and nausea post op , no intubation problems  No diet pills per patient No home 02 use per patient  No blood thinners per patient  Pt denies issues with constipation while on movantik- takes percocet and was started on movantik for constipation- on this med stools soft and regular  No A fib or A flutter  Pt states last colon 01-03-2009 with Olevia Perches she has terrible gas pains and cramping post colonoscopy  She is concerned about that this procedure

## 2016-09-19 ENCOUNTER — Encounter: Payer: Self-pay | Admitting: Gastroenterology

## 2016-10-02 ENCOUNTER — Encounter: Payer: Self-pay | Admitting: Gastroenterology

## 2016-10-02 ENCOUNTER — Ambulatory Visit (AMBULATORY_SURGERY_CENTER): Payer: BC Managed Care – PPO | Admitting: Gastroenterology

## 2016-10-02 VITALS — BP 138/54 | HR 59 | Temp 97.8°F | Resp 14 | Ht 69.0 in | Wt 244.0 lb

## 2016-10-02 DIAGNOSIS — Z86010 Personal history of colonic polyps: Secondary | ICD-10-CM

## 2016-10-02 DIAGNOSIS — D128 Benign neoplasm of rectum: Secondary | ICD-10-CM | POA: Diagnosis not present

## 2016-10-02 DIAGNOSIS — D129 Benign neoplasm of anus and anal canal: Secondary | ICD-10-CM

## 2016-10-02 MED ORDER — SODIUM CHLORIDE 0.9 % IV SOLN: 500.0000 mL | INTRAVENOUS | Status: DC

## 2016-10-02 NOTE — Progress Notes (Signed)
A/ox3 pleased with MAC, report to Jane RN 

## 2016-10-02 NOTE — Progress Notes (Signed)
Called to room to assist during endoscopic procedure.  Patient ID and intended procedure confirmed with present staff. Received instructions for my participation in the procedure from the performing physician.  

## 2016-10-02 NOTE — Op Note (Signed)
[place] Patient Name: [name] Procedure Date: 10/02/2016 11:19 AM MRN: [id number] Endoscopist: Mauri Pole , MD Age: 55 Referring MD:  Date of Birth: Mar 25, 1961 Gender: Female Account #: [id number] Procedure:                Colonoscopy Indications:              Screening for colorectal malignant neoplasm Medicines:                Monitored Anesthesia Care Procedure:                Pre-Anesthesia Assessment:                           - Prior to the procedure, a History and Physical                            was performed, and patient medications and                            allergies were reviewed. The patient's tolerance of                            previous anesthesia was also reviewed. The risks                            and benefits of the procedure and the sedation                            options and risks were discussed with the patient.                            All questions were answered, and informed consent                            was obtained. Prior Anticoagulants: The patient has                            taken no previous anticoagulant or antiplatelet                            agents. ASA Grade Assessment: II - A patient with                            mild systemic disease. After reviewing the risks                            and benefits, the patient was deemed in                            satisfactory condition to undergo the procedure.                           After obtaining informed consent, the colonoscope  was passed under direct vision. Throughout the                            procedure, the patient's blood pressure, pulse, and                            oxygen saturations were monitored continuously. The                            Model CF-HQ190L [id number]) scope was introduced                            through the anus and advanced to the the cecum,                            identified  by appendiceal orifice and ileocecal                            valve. The colonoscopy was somewhat difficult due                            to restricted mobility of the colon and the                            patient's body habitus. Successful completion of                            the procedure was aided by applying abdominal                            pressure. The patient tolerated the procedure well.                            The quality of the bowel preparation was good. The                            ileocecal valve, appendiceal orifice, and rectum                            were photographed. Scope In: 11:28:27 AM Scope Out: 11:58:06 AM Scope Withdrawal Time: 0 hours 15 minutes 49 seconds  Total Procedure Duration: 0 hours 29 minutes 39 seconds  Findings:                 The perianal and digital rectal examinations were                            normal.                           A 5 mm polyp was found in the rectum. The polyp was                            sessile. The polyp was removed with a cold snare.  Resection and retrieval were complete.                           A 3 mm polyp was found in the rectum. The polyp was                            sessile. The polyp was removed with a cold biopsy                            forceps. Resection and retrieval were complete.                           A few small-mouthed diverticula were found in the                            sigmoid colon and descending colon.                           Non-bleeding internal hemorrhoids were found during                            retroflexion. The hemorrhoids were small.                           The exam was otherwise without abnormality. Complications:            No immediate complications. Estimated Blood Loss:     Estimated blood loss: none. Estimated blood loss                            was minimal. Impression:               - One 5 mm polyp in the rectum,  removed with a cold                            snare. Resected and retrieved.                           - One 3 mm polyp in the rectum, removed with a cold                            biopsy forceps. Resected and retrieved.                           - Diverticulosis in the sigmoid colon and in the                            descending colon.                           - Non-bleeding internal hemorrhoids.                           - The examination was otherwise normal. Recommendation:           - Patient  has a contact number available for                            emergencies. The signs and symptoms of potential                            delayed complications were discussed with the                            patient. Return to normal activities tomorrow.                            Written discharge instructions were provided to the                            patient.                           - Resume previous diet.                           - Continue present medications.                           - Await pathology results.                           - Repeat colonoscopy in 5-10 years for surveillance                            based on pathology results. Mauri Pole, MD 10/02/2016 12:02:51 PM This report has been signed electronically.

## 2016-10-02 NOTE — Patient Instructions (Signed)
Discharge instructions given. Handouts on polyps,diverticulosis and hemorrhoids. Resume previous medications. YOU HAD AN ENDOSCOPIC PROCEDURE TODAY AT THE [facility]:   Refer to the procedure report that was given to you for any specific questions about what was found during the examination.  If the procedure report does not answer your questions, please call your gastroenterologist to clarify.  If you requested that your care partner not be given the details of your procedure findings, then the procedure report has been included in a sealed envelope for you to review at your convenience later.  YOU SHOULD EXPECT: Some feelings of bloating in the abdomen. Passage of more gas than usual.  Walking can help get rid of the air that was put into your GI tract during the procedure and reduce the bloating. If you had a lower endoscopy (such as a colonoscopy or flexible sigmoidoscopy) you may notice spotting of blood in your stool or on the toilet paper. If you underwent a bowel prep for your procedure, you may not have a normal bowel movement for a few days.  Please Note:  You might notice some irritation and congestion in your nose or some drainage.  This is from the oxygen used during your procedure.  There is no need for concern and it should clear up in a day or so.  SYMPTOMS TO REPORT IMMEDIATELY:   Following lower endoscopy (colonoscopy or flexible sigmoidoscopy):  Excessive amounts of blood in the stool  Significant tenderness or worsening of abdominal pains  Swelling of the abdomen that is new, acute  Fever of 100F or higher   For urgent or emergent issues, a gastroenterologist can be reached at any hour by calling [phone].   DIET:  We do recommend a small meal at first, but then you may proceed to your regular diet.  Drink plenty of fluids but you should avoid alcoholic beverages for 24 hours.  ACTIVITY:  You should plan to take it easy for the rest of today and you  should NOT DRIVE or use heavy machinery until tomorrow (because of the sedation medicines used during the test).    FOLLOW UP: Our staff will call the number listed on your records the next business day following your procedure to check on you and address any questions or concerns that you may have regarding the information given to you following your procedure. If we do not reach you, we will leave a message.  However, if you are feeling well and you are not experiencing any problems, there is no need to return our call.  We will assume that you have returned to your regular daily activities without incident.  If any biopsies were taken you will be contacted by phone or by letter within the next 1-3 weeks.  Please call us at [phone] if you have not heard about the biopsies in 3 weeks.    SIGNATURES/CONFIDENTIALITY: You and/or your care partner have signed paperwork which will be entered into your electronic medical record.  These signatures attest to the fact that that the information above on your After Visit Summary has been reviewed and is understood.  Full responsibility of the confidentiality of this discharge information lies with you and/or your care-partner. 

## 2016-10-03 ENCOUNTER — Telehealth: Payer: Self-pay | Admitting: *Deleted

## 2016-10-03 NOTE — Telephone Encounter (Signed)
No answer left message will attempt to call back later this afternoon. SM 

## 2016-10-07 ENCOUNTER — Telehealth: Payer: Self-pay

## 2016-10-07 NOTE — Telephone Encounter (Signed)
  Follow up Call-  Call back number 10/02/2016  Post procedure Call Back phone  # [phone]  Permission to leave phone message Yes  Some recent data might be hidden    Patient was called for follow up after her procedure on 10/02/2016. I spoke with the patients husband and her reports that [name] has returned to her normal daily activities without any complications following her procedure.

## 2016-10-14 ENCOUNTER — Encounter: Payer: Self-pay | Admitting: Gastroenterology

## 2017-10-12 DIAGNOSIS — M79672 Pain in left foot: Secondary | ICD-10-CM | POA: Insufficient documentation

## 2017-10-12 DIAGNOSIS — M79671 Pain in right foot: Secondary | ICD-10-CM | POA: Insufficient documentation

## 2017-10-14 DIAGNOSIS — D84821 Immunodeficiency due to drugs: Secondary | ICD-10-CM | POA: Insufficient documentation

## 2017-10-14 DIAGNOSIS — G894 Chronic pain syndrome: Secondary | ICD-10-CM | POA: Insufficient documentation

## 2017-10-14 DIAGNOSIS — G8929 Other chronic pain: Secondary | ICD-10-CM | POA: Insufficient documentation

## 2017-10-14 DIAGNOSIS — M545 Low back pain: Secondary | ICD-10-CM

## 2017-10-14 DIAGNOSIS — Z79899 Other long term (current) drug therapy: Secondary | ICD-10-CM | POA: Insufficient documentation

## 2017-10-27 DIAGNOSIS — K5903 Drug induced constipation: Secondary | ICD-10-CM | POA: Insufficient documentation

## 2017-10-27 DIAGNOSIS — T402X5A Adverse effect of other opioids, initial encounter: Secondary | ICD-10-CM

## 2017-11-10 DIAGNOSIS — Z79891 Long term (current) use of opiate analgesic: Secondary | ICD-10-CM | POA: Insufficient documentation

## 2018-01-21 DIAGNOSIS — M19079 Primary osteoarthritis, unspecified ankle and foot: Secondary | ICD-10-CM | POA: Insufficient documentation

## 2018-03-15 DIAGNOSIS — M76821 Posterior tibial tendinitis, right leg: Secondary | ICD-10-CM | POA: Insufficient documentation

## 2018-05-05 ENCOUNTER — Other Ambulatory Visit: Payer: Self-pay | Admitting: Family Medicine

## 2018-05-05 DIAGNOSIS — Z720 Tobacco use: Secondary | ICD-10-CM

## 2018-05-06 ENCOUNTER — Other Ambulatory Visit: Payer: Self-pay | Admitting: Student

## 2018-05-06 DIAGNOSIS — M67961 Unspecified disorder of synovium and tendon, right lower leg: Secondary | ICD-10-CM

## 2018-05-06 DIAGNOSIS — M67962 Unspecified disorder of synovium and tendon, left lower leg: Secondary | ICD-10-CM

## 2018-05-14 ENCOUNTER — Ambulatory Visit
Admission: RE | Admit: 2018-05-14 | Discharge: 2018-05-14 | Disposition: A | Discharge status: Home / Self Care | Payer: BC Managed Care – PPO | Source: Ambulatory Visit | Attending: Family Medicine | Admitting: Family Medicine

## 2018-05-14 DIAGNOSIS — Z720 Tobacco use: Secondary | ICD-10-CM

## 2018-05-18 ENCOUNTER — Ambulatory Visit
Admission: RE | Admit: 2018-05-18 | Discharge: 2018-05-18 | Disposition: A | Discharge status: Home / Self Care | Payer: BC Managed Care – PPO | Source: Ambulatory Visit | Attending: Student | Admitting: Student

## 2018-05-18 DIAGNOSIS — M67962 Unspecified disorder of synovium and tendon, left lower leg: Secondary | ICD-10-CM

## 2018-05-18 DIAGNOSIS — M67961 Unspecified disorder of synovium and tendon, right lower leg: Secondary | ICD-10-CM

## 2018-06-01 ENCOUNTER — Emergency Department (HOSPITAL_COMMUNITY): Payer: BC Managed Care – PPO

## 2018-06-01 ENCOUNTER — Inpatient Hospital Stay (HOSPITAL_COMMUNITY): Payer: BC Managed Care – PPO

## 2018-06-01 ENCOUNTER — Other Ambulatory Visit: Payer: Self-pay

## 2018-06-01 ENCOUNTER — Encounter (HOSPITAL_COMMUNITY): Payer: Self-pay

## 2018-06-01 ENCOUNTER — Inpatient Hospital Stay (HOSPITAL_COMMUNITY)
Admission: EM | Admit: 2018-06-01 | Discharge: 2018-06-03 | DRG: 492 | Disposition: A | Discharge status: Home Health | Payer: BC Managed Care – PPO | Attending: Internal Medicine | Admitting: Internal Medicine

## 2018-06-01 DIAGNOSIS — Y990 Civilian activity done for income or pay: Secondary | ICD-10-CM

## 2018-06-01 DIAGNOSIS — I495 Sick sinus syndrome: Secondary | ICD-10-CM | POA: Diagnosis present

## 2018-06-01 DIAGNOSIS — S82892A Other fracture of left lower leg, initial encounter for closed fracture: Secondary | ICD-10-CM | POA: Diagnosis not present

## 2018-06-01 DIAGNOSIS — E538 Deficiency of other specified B group vitamins: Secondary | ICD-10-CM | POA: Diagnosis present

## 2018-06-01 DIAGNOSIS — I700 Atherosclerosis of aorta: Secondary | ICD-10-CM | POA: Diagnosis present

## 2018-06-01 DIAGNOSIS — M1712 Unilateral primary osteoarthritis, left knee: Secondary | ICD-10-CM | POA: Diagnosis present

## 2018-06-01 DIAGNOSIS — K219 Gastro-esophageal reflux disease without esophagitis: Secondary | ICD-10-CM | POA: Diagnosis present

## 2018-06-01 DIAGNOSIS — G609 Hereditary and idiopathic neuropathy, unspecified: Secondary | ICD-10-CM | POA: Diagnosis present

## 2018-06-01 DIAGNOSIS — W010XXA Fall on same level from slipping, tripping and stumbling without subsequent striking against object, initial encounter: Secondary | ICD-10-CM | POA: Diagnosis present

## 2018-06-01 DIAGNOSIS — I4891 Unspecified atrial fibrillation: Secondary | ICD-10-CM

## 2018-06-01 DIAGNOSIS — D696 Thrombocytopenia, unspecified: Secondary | ICD-10-CM | POA: Diagnosis present

## 2018-06-01 DIAGNOSIS — I447 Left bundle-branch block, unspecified: Secondary | ICD-10-CM

## 2018-06-01 DIAGNOSIS — I480 Paroxysmal atrial fibrillation: Secondary | ICD-10-CM | POA: Diagnosis not present

## 2018-06-01 DIAGNOSIS — G8929 Other chronic pain: Secondary | ICD-10-CM | POA: Diagnosis present

## 2018-06-01 DIAGNOSIS — I483 Typical atrial flutter: Secondary | ICD-10-CM | POA: Diagnosis not present

## 2018-06-01 DIAGNOSIS — Z79899 Other long term (current) drug therapy: Secondary | ICD-10-CM

## 2018-06-01 DIAGNOSIS — R161 Splenomegaly, not elsewhere classified: Secondary | ICD-10-CM | POA: Diagnosis not present

## 2018-06-01 DIAGNOSIS — J449 Chronic obstructive pulmonary disease, unspecified: Secondary | ICD-10-CM | POA: Diagnosis present

## 2018-06-01 DIAGNOSIS — K7030 Alcoholic cirrhosis of liver without ascites: Secondary | ICD-10-CM

## 2018-06-01 DIAGNOSIS — M25462 Effusion, left knee: Secondary | ICD-10-CM | POA: Diagnosis present

## 2018-06-01 DIAGNOSIS — R55 Syncope and collapse: Secondary | ICD-10-CM | POA: Diagnosis present

## 2018-06-01 DIAGNOSIS — J309 Allergic rhinitis, unspecified: Secondary | ICD-10-CM | POA: Diagnosis present

## 2018-06-01 DIAGNOSIS — S82842A Displaced bimalleolar fracture of left lower leg, initial encounter for closed fracture: Secondary | ICD-10-CM | POA: Diagnosis present

## 2018-06-01 DIAGNOSIS — I110 Hypertensive heart disease with heart failure: Secondary | ICD-10-CM | POA: Diagnosis present

## 2018-06-01 DIAGNOSIS — S93422A Sprain of deltoid ligament of left ankle, initial encounter: Secondary | ICD-10-CM | POA: Diagnosis present

## 2018-06-01 DIAGNOSIS — E039 Hypothyroidism, unspecified: Secondary | ICD-10-CM | POA: Diagnosis present

## 2018-06-01 DIAGNOSIS — K5900 Constipation, unspecified: Secondary | ICD-10-CM | POA: Diagnosis present

## 2018-06-01 DIAGNOSIS — I8500 Esophageal varices without bleeding: Secondary | ICD-10-CM | POA: Diagnosis present

## 2018-06-01 DIAGNOSIS — I10 Essential (primary) hypertension: Secondary | ICD-10-CM | POA: Diagnosis not present

## 2018-06-01 DIAGNOSIS — R001 Bradycardia, unspecified: Secondary | ICD-10-CM

## 2018-06-01 DIAGNOSIS — W19XXXA Unspecified fall, initial encounter: Secondary | ICD-10-CM

## 2018-06-01 DIAGNOSIS — I5021 Acute systolic (congestive) heart failure: Secondary | ICD-10-CM | POA: Diagnosis present

## 2018-06-01 DIAGNOSIS — I4892 Unspecified atrial flutter: Secondary | ICD-10-CM | POA: Diagnosis present

## 2018-06-01 DIAGNOSIS — Z6841 Body Mass Index (BMI) 40.0 and over, adult: Secondary | ICD-10-CM | POA: Diagnosis not present

## 2018-06-01 DIAGNOSIS — Z882 Allergy status to sulfonamides status: Secondary | ICD-10-CM

## 2018-06-01 DIAGNOSIS — F17210 Nicotine dependence, cigarettes, uncomplicated: Secondary | ICD-10-CM | POA: Diagnosis present

## 2018-06-01 DIAGNOSIS — Z9689 Presence of other specified functional implants: Secondary | ICD-10-CM | POA: Diagnosis present

## 2018-06-01 DIAGNOSIS — I2510 Atherosclerotic heart disease of native coronary artery without angina pectoris: Secondary | ICD-10-CM | POA: Diagnosis present

## 2018-06-01 DIAGNOSIS — Z881 Allergy status to other antibiotic agents status: Secondary | ICD-10-CM

## 2018-06-01 DIAGNOSIS — E876 Hypokalemia: Secondary | ICD-10-CM | POA: Diagnosis present

## 2018-06-01 DIAGNOSIS — E785 Hyperlipidemia, unspecified: Secondary | ICD-10-CM | POA: Diagnosis present

## 2018-06-01 DIAGNOSIS — Z79890 Hormone replacement therapy (postmenopausal): Secondary | ICD-10-CM

## 2018-06-01 HISTORY — DX: Tobacco use: Z72.0

## 2018-06-01 HISTORY — DX: Spinal stenosis, site unspecified: M48.00

## 2018-06-01 HISTORY — DX: Deficiency of other specified B group vitamins: E53.8

## 2018-06-01 HISTORY — DX: Alcohol abuse, uncomplicated: F10.10

## 2018-06-01 HISTORY — DX: Benign paroxysmal vertigo, unspecified ear: H81.10

## 2018-06-01 HISTORY — DX: Unspecified cirrhosis of liver: K74.60

## 2018-06-01 LAB — COMPREHENSIVE METABOLIC PANEL
ALT: 16 U/L (ref 0–44)
ANION GAP: 11 (ref 5–15)
AST: 21 U/L (ref 15–41)
Albumin: 3.7 g/dL (ref 3.5–5.0)
Alkaline Phosphatase: 70 U/L (ref 38–126)
BUN: 15 mg/dL (ref 6–20)
CHLORIDE: 104 mmol/L (ref 98–111)
CO2: 27 mmol/L (ref 22–32)
Calcium: 9.4 mg/dL (ref 8.9–10.3)
Creatinine, Ser: 0.92 mg/dL (ref 0.44–1.00)
GFR calc non Af Amer: >60 mL/min (ref >60)
Glucose, Bld: 129 mg/dL — ABNORMAL HIGH (ref 70–99)
POTASSIUM: 3.4 mmol/L — AB (ref 3.5–5.1)
Sodium: 142 mmol/L (ref 135–145)
Total Bilirubin: 0.9 mg/dL (ref 0.3–1.2)
Total Protein: 6.7 g/dL (ref 6.5–8.1)

## 2018-06-01 LAB — CBC WITH DIFFERENTIAL/PLATELET
Basophils Absolute: 0 10*3/uL (ref 0.0–0.1)
Basophils Relative: 0 %
EOS ABS: 0.3 10*3/uL (ref 0.0–0.7)
EOS PCT: 3 %
HCT: 44.6 % (ref 36.0–46.0)
Hemoglobin: 14.7 g/dL (ref 12.0–15.0)
LYMPHS ABS: 1.7 10*3/uL (ref 0.7–4.0)
Lymphocytes Relative: 23 %
MCH: 33 pg (ref 26.0–34.0)
MCHC: 33 g/dL (ref 30.0–36.0)
MCV: 100 fL (ref 78.0–100.0)
MONO ABS: 0.5 10*3/uL (ref 0.1–1.0)
MONOS PCT: 7 %
Neutro Abs: 4.8 10*3/uL (ref 1.7–7.7)
Neutrophils Relative %: 67 %
PLATELETS: 121 10*3/uL — AB (ref 150–400)
RBC: 4.46 MIL/uL (ref 3.87–5.11)
RDW: 14.1 % (ref 11.5–15.5)
WBC: 7.3 10*3/uL (ref 4.0–10.5)

## 2018-06-01 LAB — MAGNESIUM: MAGNESIUM: 2.1 mg/dL (ref 1.7–2.4)

## 2018-06-01 LAB — ECHOCARDIOGRAM COMPLETE
Height: 69 in
WEIGHTICAEL: 4160 [oz_av]

## 2018-06-01 LAB — SURGICAL PCR SCREEN
MRSA, PCR: NEGATIVE
Staphylococcus aureus: NEGATIVE

## 2018-06-01 LAB — TROPONIN I: Troponin I: <0.03 ng/mL (ref <0.03)

## 2018-06-01 LAB — TSH: TSH: 3.276 u[IU]/mL (ref 0.350–4.500)

## 2018-06-01 MED ORDER — FAMOTIDINE 20 MG PO TABS
20.0000 mg | ORAL_TABLET | Freq: Three times a day (TID) | ORAL | Status: DC
  Administered 2018-06-01 – 2018-06-03 (×5): 20 mg via ORAL
  Filled 2018-06-01 (×5): qty 1

## 2018-06-01 MED ORDER — PREGABALIN 75 MG PO CAPS
150.0000 mg | ORAL_CAPSULE | Freq: Three times a day (TID) | ORAL | Status: DC
  Administered 2018-06-01 – 2018-06-03 (×5): 150 mg via ORAL
  Filled 2018-06-01 (×5): qty 2

## 2018-06-01 MED ORDER — LINACLOTIDE 145 MCG PO CAPS
145.0000 ug | ORAL_CAPSULE | Freq: Two times a day (BID) | ORAL | Status: DC | PRN
  Filled 2018-06-01: qty 1

## 2018-06-01 MED ORDER — TIZANIDINE HCL 4 MG PO TABS
4.0000 mg | ORAL_TABLET | Freq: Every day | ORAL | Status: DC
  Administered 2018-06-01 – 2018-06-02 (×2): 4 mg via ORAL
  Filled 2018-06-01 (×3): qty 1

## 2018-06-01 MED ORDER — ROSUVASTATIN CALCIUM 10 MG PO TABS
10.0000 mg | ORAL_TABLET | Freq: Every day | ORAL | Status: DC
  Administered 2018-06-01 – 2018-06-02 (×2): 10 mg via ORAL
  Filled 2018-06-01 (×2): qty 1

## 2018-06-01 MED ORDER — LEVOTHYROXINE SODIUM 25 MCG PO TABS
25.0000 ug | ORAL_TABLET | Freq: Every day | ORAL | Status: DC
  Administered 2018-06-03: 25 ug via ORAL
  Filled 2018-06-01: qty 1

## 2018-06-01 MED ORDER — DILTIAZEM HCL 30 MG PO TABS
30.0000 mg | ORAL_TABLET | Freq: Four times a day (QID) | ORAL | Status: DC
  Administered 2018-06-01: 30 mg via ORAL
  Filled 2018-06-01: qty 1

## 2018-06-01 MED ORDER — MORPHINE SULFATE (PF) 2 MG/ML IV SOLN
2.0000 mg | INTRAVENOUS | Status: DC | PRN
  Administered 2018-06-01: 2 mg via INTRAVENOUS
  Filled 2018-06-01: qty 1

## 2018-06-01 MED ORDER — HYDROMORPHONE HCL 1 MG/ML IJ SOLN
1.0000 mg | INTRAMUSCULAR | Status: DC | PRN
  Administered 2018-06-01 – 2018-06-02 (×6): 1 mg via INTRAVENOUS
  Filled 2018-06-01 (×6): qty 1

## 2018-06-01 MED ORDER — FLUTICASONE FUROATE-VILANTEROL 200-25 MCG/INH IN AEPB
1.0000 | INHALATION_SPRAY | Freq: Every day | RESPIRATORY_TRACT | Status: DC
  Administered 2018-06-03: 1 via RESPIRATORY_TRACT
  Filled 2018-06-01: qty 28

## 2018-06-01 MED ORDER — CHLORHEXIDINE GLUCONATE 4 % EX LIQD
60.0000 mL | Freq: Once | CUTANEOUS | Status: AC
  Administered 2018-06-02: 4 via TOPICAL
  Filled 2018-06-01: qty 60

## 2018-06-01 MED ORDER — IBUPROFEN 800 MG PO TABS
800.0000 mg | ORAL_TABLET | Freq: Three times a day (TID) | ORAL | Status: DC
  Administered 2018-06-01 (×2): 800 mg via ORAL
  Filled 2018-06-01 (×2): qty 1

## 2018-06-01 MED ORDER — FLUTICASONE PROPIONATE 50 MCG/ACT NA SUSP
2.0000 | Freq: Every day | NASAL | Status: DC
  Administered 2018-06-02 – 2018-06-03 (×2): 2 via NASAL
  Filled 2018-06-01 (×2): qty 16

## 2018-06-01 MED ORDER — OXYCODONE HCL 5 MG PO TABS
5.0000 mg | ORAL_TABLET | ORAL | Status: DC | PRN
  Administered 2018-06-01: 5 mg via ORAL
  Filled 2018-06-01: qty 1

## 2018-06-01 MED ORDER — NICOTINE 14 MG/24HR TD PT24
14.0000 mg | MEDICATED_PATCH | Freq: Every day | TRANSDERMAL | Status: DC
  Administered 2018-06-01 – 2018-06-03 (×3): 14 mg via TRANSDERMAL
  Filled 2018-06-01 (×3): qty 1

## 2018-06-01 MED ORDER — CHLORHEXIDINE GLUCONATE 4 % EX LIQD
60.0000 mL | Freq: Once | CUTANEOUS | Status: DC
  Filled 2018-06-01: qty 60

## 2018-06-01 MED ORDER — HYDROMORPHONE HCL 1 MG/ML IJ SOLN
1.0000 mg | Freq: Once | INTRAMUSCULAR | Status: AC
  Administered 2018-06-01: 1 mg via INTRAVENOUS
  Filled 2018-06-01: qty 1

## 2018-06-01 MED ORDER — MUPIROCIN 2 % EX OINT
1.0000 "application " | TOPICAL_OINTMENT | Freq: Two times a day (BID) | CUTANEOUS | Status: DC
  Administered 2018-06-01 – 2018-06-03 (×4): 1 via NASAL
  Filled 2018-06-01: qty 22

## 2018-06-01 MED ORDER — AMIODARONE HCL IN DEXTROSE 360-4.14 MG/200ML-% IV SOLN
30.0000 mg/h | INTRAVENOUS | Status: DC
  Filled 2018-06-01: qty 200

## 2018-06-01 MED ORDER — POTASSIUM CHLORIDE CRYS ER 20 MEQ PO TBCR
40.0000 meq | EXTENDED_RELEASE_TABLET | Freq: Once | ORAL | Status: AC
  Administered 2018-06-01: 40 meq via ORAL
  Filled 2018-06-01: qty 2

## 2018-06-01 MED ORDER — OLOPATADINE HCL 0.1 % OP SOLN: 1.0000 [drp] | Freq: Every day | OPHTHALMIC | Status: DC | PRN

## 2018-06-01 MED ORDER — OXYCODONE HCL 5 MG PO TABS
10.0000 mg | ORAL_TABLET | ORAL | Status: DC | PRN
  Administered 2018-06-01: 10 mg via ORAL
  Filled 2018-06-01 (×2): qty 2

## 2018-06-01 MED ORDER — AMIODARONE HCL IN DEXTROSE 360-4.14 MG/200ML-% IV SOLN
60.0000 mg/h | INTRAVENOUS | Status: DC
  Administered 2018-06-01: 60 mg/h via INTRAVENOUS
  Filled 2018-06-01: qty 200

## 2018-06-01 MED ORDER — ALBUTEROL SULFATE (2.5 MG/3ML) 0.083% IN NEBU: 2.5000 mg | INHALATION_SOLUTION | Freq: Four times a day (QID) | RESPIRATORY_TRACT | Status: DC | PRN

## 2018-06-01 MED ORDER — FOLIC ACID 1 MG PO TABS
1.0000 mg | ORAL_TABLET | Freq: Every day | ORAL | Status: DC
  Administered 2018-06-03: 1 mg via ORAL
  Filled 2018-06-01: qty 1

## 2018-06-01 MED ORDER — POVIDONE-IODINE 10 % EX SWAB
2.0000 "application " | Freq: Once | CUTANEOUS | Status: AC
  Administered 2018-06-02: 2 via TOPICAL

## 2018-06-01 MED ORDER — LACTATED RINGERS IV SOLN
INTRAVENOUS | Status: DC
  Administered 2018-06-02: 01:00:00 via INTRAVENOUS

## 2018-06-01 MED ORDER — ENOXAPARIN SODIUM 40 MG/0.4ML ~~LOC~~ SOLN: 40.0000 mg | SUBCUTANEOUS | Status: DC

## 2018-06-01 MED ORDER — CEFAZOLIN SODIUM-DEXTROSE 2-4 GM/100ML-% IV SOLN
2.0000 g | INTRAVENOUS | Status: AC
  Administered 2018-06-02: 2 g via INTRAVENOUS
  Filled 2018-06-01 (×2): qty 100

## 2018-06-01 NOTE — Progress Notes (Signed)
  Echocardiogram 2D Echocardiogram has been performed.  Osa Fogarty L Androw 06/01/2018, 1:59 PM

## 2018-06-01 NOTE — Progress Notes (Signed)
Patient mostly in NSR now rates 50's but has had frequent PAF since here In light of needing to go to OR tomorrow should be on AAT to try to maintain NSR TTE done and reviewed EF 45-50% with abnormal septal motion from LBBB Mild LVE/RVE moderate biatrial enlargement Given mildly decreased EF, LBBB And coronary calcium as seen on CT 05/14/18 will use iv amiodarone with no bolus Continue with DVT lovenox prior to surgery since she is in NSR currently Will likely  Need DOAC post op   Jenkins Rouge

## 2018-06-01 NOTE — Consult Note (Addendum)
Cardiology Consultation:   Patient ID: [name]; [id number]; 07/19/1961   Admit date: 06/01/2018 Date of Consult: 06/01/2018  Primary Care Provider: Aletha Halim., PA-C Primary Cardiologist: New to Dr. Johnsie Cancel  Chief Complaint: pre-syncope - "I had a brown-out"  Patient Profile:   [name] is a 58 y.o. female with a hx of ongoing tobacco abuse, former alcohol abuse, cirrhosis (pt states prior EGD negative for varices), B12 deficiency, peripheral neuropathy, BPPV, aortic atherosclerosis/coronary atherosclerosis by CT 05/2018 along with COPD, hypothyroidism, HLD, spinal stenosis with spinal stimulator who is being seen today for the evaluation of bradycardia and irregular rhythm at the request of Dr. Alvino Chapel.  History of Present Illness:   [name] has a longstanding history of what has been determined as BPPV for 5-6 years, described as a quick sensation of dizziness for a few seconds that resolves spontaneously. She's seen ENT for this. She's not had prior cardiac workup or cardiac history, but a low dose lung CA screening CT in 05/2018 did demonstrate aortic atherosclerosis, in addition to left anterior descending coronary artery disease. She is adopted so family history is unknown. She was in her USOH this AM and woke up around 4am for her job in the [employer]. She felt more tired than usual but attributed it to the early AM. She felt much more persistently dizzy than usual. She went on to get ready for work and dizziness improved. 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 She did not trip or loose her balance. She's unsure what exactly precipitated the fall. At that moment she also felt she had a "brownout" where she nearly passed out but did not go out completely. When she hit the floor, she was able to exclaim a cuss word. She did not have any b/b incontinence. No CP,  palpitations, SOB, prior hx of presyncope or syncope. Workup reveals a left knee joint effusion and acute ankle fracture for which ortho is being consulted. She has also been noted to be intermittently bradycardic in the ER down to the mid 40s (lowest 46, sinus bradycardia) as well as tachycardic in the 120s which appears to be atrial fib/flutter at times. Unfortunately when they assigned her name to her telemetry and put her leads back on, much of the data from her arrival to 10:30am was erased but we were able to capture this when leads were replaced. She is currently asymptomatic except for marked ankle pain. Labs showed K 3.4, troponin negative, plt 121, glucose 129.  Past Medical History:  Diagnosis Date  . Alcohol abuse    a. quit several years ago.  . Allergy   . Anemia    younger  . Arthritis    feet,knees  . B12 deficiency   . BPPV (benign paroxysmal positional vertigo)   . Cirrhosis ([place])   . Complication of anesthesia    "slow to wake"  . Constipation    on movantik- stools regular and soft on this  . Foot drop   . Hyperlipidemia   . Hypothyroidism   . Neuromuscular disorder (HCC)    idiopathic neuropathy  . Spinal stenosis   . Tobacco abuse     Past Surgical History:  Procedure Laterality Date  . ANKLE SURGERY    . CERVICAL CONE BIOPSY    . CESAREAN SECTION    . COLONOSCOPY    . HERNIA REPAIR    .  KNEE SURGERY     x5  . POLYPECTOMY    . SPINAL CORD STIMULATOR INSERTION N/A 05/07/2016   Procedure: LUMBAR SPINAL CORD STIMULATOR INSERTION;  Surgeon: Melina Schools, MD;  Location: [facility];  Service: Orthopedics;  Laterality: N/A;     Inpatient Medications: Scheduled Meds:  Continuous Infusions: . sodium chloride     PRN Meds:   Home Meds: Prior to Admission medications   Medication Sig Start Date End Date Taking? Authorizing Provider  famotidine (PEPCID) 20 MG tablet Take 20 mg by mouth 3 (three) times daily. 05/26/18  Yes [provider]  folic acid  (FOLVITE) 1 MG tablet Take 1 mg by mouth daily.  04/01/16  Yes [provider]  hydrochlorothiazide (HYDRODIURIL) 12.5 MG tablet Take 12.5 mg by mouth daily.  03/24/16  Yes [provider]  ibuprofen (ADVIL,MOTRIN) 800 MG tablet Take 800 mg by mouth 3 (three) times daily. 05/19/18  Yes [provider]  levothyroxine (SYNTHROID) 25 MCG tablet Take 25 mcg by mouth daily before breakfast.  03/31/16  Yes [provider]  oxyCODONE-acetaminophen (PERCOCET) 10-325 MG tablet Take 1 tablet by mouth every 4 (four) hours as needed for pain. Patient taking differently: Take 1 tablet by mouth every 6 (six) hours as needed for pain.  05/07/16  Yes Melina Schools, MD  pregabalin (LYRICA) 150 MG capsule Take 150 mg by mouth 3 (three) times daily.  11/15/15  Yes [provider]  cyanocobalamin (,VITAMIN B-12,) 1000 MCG/ML injection Inject 1,000 mcg into the muscle once a week.  03/11/16   [provider]  Ibuprofen-Famotidine (DUEXIS) 800-26.6 MG TABS TAKE 1 TABLET 3 TIMES A DAY 06/16/16   [provider]  naloxegol oxalate (MOVANTIK) 12.5 MG TABS tablet Take 12.5 mg by mouth daily.     [provider]  rosuvastatin (CRESTOR) 10 MG tablet Take 10 mg by mouth.    [provider]  tiZANidine (ZANAFLEX) 4 MG tablet Take one pill q 8 hours 05/14/16   [provider]  Vitamin D, Ergocalciferol, (DRISDOL) 50000 units CAPS capsule Take 50,000 Units by mouth 2 (two) times a week. Sunday and Wednesday 03/16/16   [provider]    Allergies:    Allergies  Allergen Reactions  . Vancomycin Hives  . Sulfamethoxazole     GI Upset    Social History:   Social History   Socioeconomic History  . Marital status: Married    Spouse name: Not on file  . Number of children: Not on file  . Years of education: Not on file  . Highest education level: Not on file  Occupational History  . Not on file  Social Needs  . Financial resource  strain: Not on file  . Food insecurity:    Worry: Not on file    Inability: Not on file  . Transportation needs:    Medical: Not on file    Non-medical: Not on file  Tobacco Use  . Smoking status: Current Every Day Smoker    Packs/day: 1.00    Years: 30.00    Pack years: 30.00    Types: Cigarettes  . Smokeless tobacco: Never Used  Substance and Sexual Activity  . Alcohol use: Not Currently    Comment: Prior alcohol abuse - quit several years ago.  . Drug use: No  . Sexual activity: Yes  Lifestyle  . Physical activity:    Days per week: Not on file    Minutes per session: Not on file  .  Stress: Not on file  Relationships  . Social connections:    Talks on phone: Not on file    Gets together: Not on file    Attends religious service: Not on file    Active member of club or organization: Not on file    Attends meetings of clubs or organizations: Not on file    Relationship status: Not on file  . Intimate partner violence:    Fear of current or ex partner: Not on file    Emotionally abused: Not on file    Physically abused: Not on file    Forced sexual activity: Not on file  Other Topics Concern  . Not on file  Social History Narrative  . Not on file    Family History:   The patient's family history is not on file. She was adopted.  ROS:  Please see the history of present illness.  All other ROS reviewed and negative.     Physical Exam/Data:   Vitals:   06/01/18 0930 06/01/18 1000 06/01/18 1001 06/01/18 1030  BP: 128/82  (!) 169/83 (!) 118/106  Pulse: 66 (!) 56 (!) 56 87  Resp: 14  16 17   Temp:      SpO2: 97%  95% 99%  Weight:      Height:       No intake or output data in the 24 hours ending 06/01/18 1042 Filed Weights   06/01/18 0742  Weight: 117.9 kg   Body mass index is 38.4 kg/m.  General: Well developed, well nourished WF, in no acute distress. Cheerful Head: Normocephalic, atraumatic, sclera non-icteric, no xanthomas, nares are without  discharge. Neck: Negative for carotid bruits. JVD not elevated. Lungs: Clear bilaterally to auscultation without wheezes, rales, or rhonchi. Breathing is unlabored. Heart: Intermittently irregular/tachycardic with S1 S2. No murmurs, rubs, or gallops appreciated. Abdomen: Soft, non-tender, non-distended with normoactive bowel sounds. No hepatomegaly. No rebound/guarding. No obvious abdominal masses. Msk:  Strength and tone appear normal for age. Extremities: No clubbing or cyanosis. No edema.  Distal pedal pulses are 2+ and equal bilaterally. L ankle with icepack on it Neuro: Alert and oriented X 3. No facial asymmetry. No focal deficit. Moves all extremities spontaneously. Psych:  Responds to questions appropriately with a normal affect.  EKG:  The EKG was personally reviewed. Initial tracings show sinus bradycardia 54bpm with a new LBBB and occ PACs. SUbseuqent tracings appear to show atrial fib/flutter with HR 74-124.  Laboratory Data:  Chemistry Recent Labs  Lab 06/01/18 0859  NA 142  K 3.4*  CL 104  CO2 27  GLUCOSE 129*  BUN 15  CREATININE 0.92  CALCIUM 9.4  GFRNONAA >60  GFRAA >60  ANIONGAP 11    Recent Labs  Lab 06/01/18 0859  PROT 6.7  ALBUMIN 3.7  AST 21  ALT 16  ALKPHOS 70  BILITOT 0.9   Hematology Recent Labs  Lab 06/01/18 0859  WBC 7.3  RBC 4.46  HGB 14.7  HCT 44.6  MCV 100.0  MCH 33.0  MCHC 33.0  RDW 14.1  PLT 121*   Cardiac Enzymes Recent Labs  Lab 06/01/18 0859  TROPONINI <0.03   No results for input(s): TROPIPOC in the last 168 hours.  BNPNo results for input(s): BNP, PROBNP in the last 168 hours.  DDimer No results for input(s): DDIMER in the last 168 hours.  Radiology/Studies:  Dg Ankle Complete Left  Result Date: 06/01/2018 CLINICAL DATA:  57 year old female status post fall at work with severe  ankle pain. EXAM: LEFT ANKLE COMPLETE - 3+ VIEW COMPARISON:  Left ankle series 07/25/2006. FINDINGS: Oblique comminuted fracture of the  distal left fibula metadiaphysis with lateral angulation and mild lateral displacement. Associated lateral subluxation of the mortise joint with combined acute and chronic medial malleolus bone fragments. Associated abnormal widening of the tibiofibular syndesmosis. No other distal tibia fracture. Mild irregularity of the talar dome appears somewhat non acute. Calcaneus appears stable and intact. There is fragmentation of the navicular bone which is new since 2007 and appears to be associated with new but chronic TMT joint degeneration. IMPRESSION: 1. Acute comminuted fracture of the distal left fibula metadiaphysis with mild lateral displacement and angulation. 2. Associated injury to the tibia-fibular syndesmosis, small avulsions at the medial malleolus, and lateral subluxation of the mortise joint. 3. Age indeterminate but probably chronic fragmentation of the navicular, in association with other chronic distal tarsal bone degeneration. Electronically Signed   By: Genevie Ann M.D.   On: 06/01/2018 08:52   Dg Knee Complete 4 Views Left  Result Date: 06/01/2018 CLINICAL DATA:  [place] at work today.  Leg pain. EXAM: LEFT KNEE - COMPLETE 4+ VIEW COMPARISON:  None. FINDINGS: Osteoarthritis, most pronounced in the medial compartment and patellofemoral joint. Several intra articular loose bodies. Moderate joint effusion. No evidence of fracture. IMPRESSION: Advanced chronic osteoarthritis. Loose bodies. Joint effusion. No acute fracture. Electronically Signed   By: Nelson Chimes M.D.   On: 06/01/2018 09:46    Assessment and Plan:   1. Acute on chronic dizziness with resultant left ankle fracture and left knee effusion - ortho to see. Will need admission to IM for pain management per Dr. Johnsie Cancel.  2. 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 I question whether she will require PPM to be able to treat her  higher HRs, as her lower HRs appear to be sinus and higher HRs are afib/flutter. Dr. Johnsie Cancel recommends a trial of cardizem 30mg  q6hours to treat higher HR, continuing telemetry, and obtaining echo. He will decide on antiarrhythmic therapy based on LVEF. She did have coronary atherosclerosis on CT in 05/2018 but no ischemic symptoms. He recommends to hold off anticoagulation until plan is known for her ankle. He is unsure if her cardiac rhythm perpetuated #1 as she has a long history of BPPV.  Will need to see if any recurrent sx correlate with abnormal telemetry findings. She has not had any active dizziness in the ER with the rhythm above.  3. Cirrhosis, mild thrombocytopenia noted - no prior hx of bleeding. Does not presently follow with anyone for this. She stopped drinking several years ago.  4. Hypothyroidism - TSH wnl.  5. Tobacco abuse with probable COPD per CT 05/2018 - cessation advised.  6. Coronary atherosclerosis by CT 05/2018 - no recent anginal symptoms. Continue Crestor and would suggest lipid profile with admission labs. Goal LDL <70.  7. Hypokalemia - on HCTZ at home which can be held for now. She took already today. Replete with KCL 76meq x 1. Mg level WNL. Further management of lytes per primary team.  For questions or updates, please contact [facility] Please consult [email or web address] for contact info under Cardiology/STEMI.    Signed, Charlie Pitter, PA-C  06/01/2018 10:42 AM   Patient examined chart reviewed. No cardiac history. No chest pain. ECG with new LBBB. While in room she had PAF with rates 55 to 115 no symptoms. History  of chronic inner ear dizziness. Exam with overweight white female Fractured left  ankle and swollen knee. Considerable pain still. Dr Oralia Rud to see for ortho Give knee effusion and fresh fracture would not systemically anticoagulate at the time DVT prophylaxis ok. No beta blocker due to bradycardia when in sinus. Cardizem for rate control orally when in  afib. TTE to be done today. If EF normal will start flecainide 75 bid No clinical angina or history of CAD. If EF low will use iv amiodarone. Hold diuretic and replete K Discussed diagnosis of PAF and COPD with need to stop smoking. TSH is normal on replacement   Jenkins Rouge

## 2018-06-01 NOTE — Consult Note (Signed)
Reason for Consult:  Left ankle injury Referring Physician: Dr. Marcy Panning is an 57 y.o. [name].  HPI: The patient is a 57 year old [name] with a past medical history of smoking and peripheral neuropathy.  She was at work this morning when she had a syncopal episode and fell injuring her left ankle.  She was seen in my office in late July for a right foot problem.  At that time there were no left lower extremity issues.  She complains of moderate pain in the left ankle today.  Her pain is better now that she is splinted.  Motion at that left lower extremity causes the ankle to hurt more.  She is not diabetic.  She smokes 4 cigarettes a day.  She has not had any surgery on the left ankle past.  She is being treated by the cardiology team for her syncopal episode.  Past Medical History:  Diagnosis Date  . Alcohol abuse    a. quit several years ago.  . Allergy   . Anemia    younger  . Arthritis    feet,knees  . B12 deficiency   . BPPV (benign paroxysmal positional vertigo)   . Cirrhosis ([place])   . Complication of anesthesia    "slow to wake"  . Constipation    on movantik- stools regular and soft on this  . Foot drop   . Hyperlipidemia   . Hypothyroidism   . Neuromuscular disorder (HCC)    idiopathic neuropathy  . Spinal stenosis   . Tobacco abuse     Past Surgical History:  Procedure Laterality Date  . ANKLE SURGERY    . CERVICAL CONE BIOPSY    . CESAREAN SECTION    . COLONOSCOPY    . HERNIA REPAIR    . KNEE SURGERY     x5  . POLYPECTOMY    . SPINAL CORD STIMULATOR INSERTION N/A 05/07/2016   Procedure: LUMBAR SPINAL CORD STIMULATOR INSERTION;  Surgeon: Melina Schools, MD;  Location: [facility];  Service: Orthopedics;  Laterality: N/A;    Family History  Adopted: Yes    Social History:  reports that she has been smoking cigarettes. She has a 30.00 pack-year smoking history. She has never used smokeless tobacco. She reports that she drank alcohol. She reports that  she does not use drugs.  Allergies:  Allergies  Allergen Reactions  . Vancomycin Hives  . Sulfamethoxazole     GI Upset    Medications: I have reviewed the patient's current medications.  Results for orders placed or performed during the hospital encounter of 06/01/18 (from the past 48 hour(s))  Troponin I     Status: None   Collection Time: 06/01/18  8:59 AM  Result Value Ref Range   Troponin I <0.03 <0.03 ng/mL    Comment: Performed at [facility], [place] [address], [place], [place] [address]  CBC with Differential     Status: Abnormal   Collection Time: 06/01/18  8:59 AM  Result Value Ref Range   WBC 7.3 4.0 - 10.5 K/uL   RBC 4.46 3.87 - 5.11 MIL/uL   Hemoglobin 14.7 12.0 - 15.0 g/dL   HCT 44.6 36.0 - 46.0 %   MCV 100.0 78.0 - 100.0 fL   MCH 33.0 26.0 - 34.0 pg   MCHC 33.0 30.0 - 36.0 g/dL   RDW 14.1 11.5 - 15.5 %   Platelets 121 (L) 150 - 400 K/uL   Neutrophils Relative % 67 %  Neutro Abs 4.8 1.7 - 7.7 K/uL   Lymphocytes Relative 23 %   Lymphs Abs 1.7 0.7 - 4.0 K/uL   Monocytes Relative 7 %   Monocytes Absolute 0.5 0.1 - 1.0 K/uL   Eosinophils Relative 3 %   Eosinophils Absolute 0.3 0.0 - 0.7 K/uL   Basophils Relative 0 %   Basophils Absolute 0.0 0.0 - 0.1 K/uL    Comment: Performed at [facility], [place] [address] [place], [place] [address]  Comprehensive metabolic panel     Status: Abnormal   Collection Time: 06/01/18  8:59 AM  Result Value Ref Range   Sodium 142 135 - 145 mmol/L   Potassium 3.4 (L) 3.5 - 5.1 mmol/L   Chloride 104 98 - 111 mmol/L   CO2 27 22 - 32 mmol/L   Glucose, Bld 129 (H) Tracy - 99 mg/dL   BUN 15 6 - 20 mg/dL   Creatinine, Ser 0.92 0.44 - 1.00 mg/dL   Calcium 9.4 8.9 - 10.3 mg/dL   Total Protein 6.7 6.5 - 8.1 g/dL   Albumin 3.7 3.5 - 5.0 g/dL   AST 21 15 - 41 U/L   ALT 16 0 - 44 U/L   Alkaline Phosphatase Tracy 38 - 126 U/L   Total Bilirubin 0.9 0.3 - 1.2 mg/dL   GFR calc non Af Amer >60 >60 mL/min    GFR calc Af Amer >60 >60 mL/min    Comment: (NOTE) The eGFR has been calculated using the CKD EPI equation. This calculation has not been validated in all clinical situations. eGFR's persistently <60 mL/min signify possible Chronic Kidney Disease.    Anion gap 11 5 - 15    Comment: Performed at [facility], [place] [address] [place], [place] [address]  Magnesium     Status: None   Collection Time: 06/01/18  8:59 AM  Result Value Ref Range   Magnesium 2.1 1.7 - 2.4 mg/dL    Comment: Performed at [facility], [place] [address] [place], [place] [address]  TSH     Status: None   Collection Time: 06/01/18  9:00 AM  Result Value Ref Range   TSH 3.276 0.350 - 4.500 uIU/mL    Comment: Performed by a 3rd Generation assay with a functional sensitivity of <=0.01 uIU/mL. Performed at [facility], [place] [address] [place], [place] [address]     Dg Ankle Complete Left  Result Date: 06/01/2018 CLINICAL DATA:  57 year old [name] status post fall at work with severe ankle pain.  EXAM: LEFT ANKLE COMPLETE - 3+ VIEW COMPARISON:  Left ankle series 07/25/2006. FINDINGS: Oblique comminuted fracture of the distal left fibula metadiaphysis with lateral angulation and mild lateral displacement. Associated lateral subluxation of the mortise joint with combined acute and chronic medial malleolus bone fragments. Associated abnormal widening of the tibiofibular syndesmosis. No other distal tibia fracture. Mild irregularity of the talar dome appears somewhat non acute. Calcaneus appears stable and intact. There is fragmentation of the navicular bone which is new since 2007 and appears to be associated with new but chronic TMT joint degeneration. IMPRESSION: 1. Acute comminuted fracture of the distal left fibula metadiaphysis with mild lateral displacement and angulation. 2. Associated injury to the tibia-fibular syndesmosis, small avulsions at the medial malleolus,  and lateral subluxation of the mortise joint. 3. Age indeterminate but probably chronic fragmentation of the navicular, in association with other chronic distal tarsal bone degeneration. Electronically Signed   By: Herminio Heads.D.  On: 06/01/2018 08:52   Ct Head Wo Contrast  Result Date: 06/01/2018 CLINICAL DATA:  Focal neuro deficit, greater than 6 hours stroke suspected. Altered level of consciousness. Fall today with possible loss consciousness. EXAM: CT HEAD WITHOUT CONTRAST TECHNIQUE: Contiguous axial images were obtained from the base of the skull through the vertex without intravenous contrast. COMPARISON:  None. FINDINGS: Brain: No acute infarct, hemorrhage, or mass lesion is present. The ventricles are of normal size. No significant extraaxial fluid collection is present. No significant white matter disease is present. Brainstem and cerebellum are normal. Vascular: No hyperdense vessel or unexpected calcification. Skull: Calvarium is intact. No focal lytic or blastic lesions are present. No significant extracranial soft tissue injury is present. Sinuses/Orbits: The paranasal sinuses and mastoid air cells are clear. Globes and orbits are within normal limits. IMPRESSION: Negative CT of the head. Electronically Signed   By: San Morelle M.D.   On: 06/01/2018 12:29   Dg Knee Complete 4 Views Left  Result Date: 06/01/2018 CLINICAL DATA:  [place] at work today.  Leg pain. EXAM: LEFT KNEE - COMPLETE 4+ VIEW COMPARISON:  None. FINDINGS: Osteoarthritis, most pronounced in the medial compartment and patellofemoral joint. Several intra articular loose bodies. Moderate joint effusion. No evidence of fracture. IMPRESSION: Advanced chronic osteoarthritis. Loose bodies. Joint effusion. No acute fracture. Electronically Signed   By: Nelson Chimes M.D.   On: 06/01/2018 09:46    ROS: No recent fever, chills, nausea, vomiting or changes in her appetite. PE:  Blood pressure 131/65, pulse Tracy, temperature 97.9 F  (36.6 C), temperature source Oral, resp. rate 18, height 5' 9"  (1.753 m), weight 117.9 kg, SpO2 100 %. Well-nourished well-developed woman in no apparent distress.  Alert and oriented x4.  Mood and affect are normal.  Extraocular motions are intact.  Respirations are unlabored.  Gait is nonweightbearing on the left lower extremity.  The left lower extremity is immobilized in a short leg splint.  The toes have brisk capillary refill.  The visible skin is healthy and intact.  No lymphadenopathy at the level of the knee.  Sensibility to light touch is intact dorsally and plantarly at the forefoot.  5 out of 5 strength in plantar flexion and dorsiflexion of the toes.  Assessment/Plan: Left ankle bimalleolar fracture-closed and displaced- I explained to the patient the nature of this injury.  The fractures are displaced and unstable.  Operative treatment is necessary in order to reduce and stabilize the fractures.  She understands the risks and benefits of the alternative treatment options and elects surgical treatment.  She specifically understands risks of bleeding, infection, nerve damage, blood clots, post traumatic arthritis, nonunion, chronic pain, amputation and death.  She is scheduled for surgery tomorrow at 1230.  N.p.o. after midnight.  Hold blood thinners.  Wylene Simmer 06/01/2018, 4:16 PM

## 2018-06-01 NOTE — Progress Notes (Signed)
Outgoing RN gave report to receiving RN at shift change. This RN will call to see if patient can be transported to stepdown

## 2018-06-01 NOTE — ED Notes (Signed)
Bed: [facility]PT2 Expected date:  Expected time:  Means of arrival:  Comments: 

## 2018-06-01 NOTE — Progress Notes (Signed)
Earlier received call from nurse about 3.4 sec pause. Reviewed with Dr. Johnsie Cancel, cardizem was discontinued. Furthermore this PM she was having 3.2, 3.7, 4.0 sec pauses, asymptomatic, BP stable. D/w Dr. Harrington Challenger who is on call. She recommends we stop amiodarone completely. She feels given the EKG showing LBBB and conduction disease, she would consider having patient undergo cath during this admission to exclude ischemia. She does not feel this would delay ankle surgery. Dr. Johnsie Cancel can review in AM. Recommend patient have pacer pads at bedside and move to stepdown. Paged IM to make them aware. Dr. Wynelle Cleveland was updated, she was driving so requested I place transfer order. Cardiology team to see in AM.   After ankle surgery would suggest transfer to [facility].  Dayna Dunn PA-C

## 2018-06-01 NOTE — Progress Notes (Signed)
Transported patient to room 1233

## 2018-06-01 NOTE — ED Provider Notes (Addendum)
[facility] DEPT Provider Note   CSN: [id number] Arrival date & time: 06/01/18  7048     History   Chief Complaint Chief Complaint  Patient presents with  . Fall  . Dizziness  . Ankle Injury    HPI [name] is a 57 y.o. female.  HPI Patient presents with left ankle injury after feeling lightheaded and falling.  States she was turning on the lights in the kitchen when she "browned out".  States she felt very lightheaded.  Then states her left leg gave out and she fell down.  No other injury.  No headache.  No neck pain.  No chest pain.  History of vertigo but states this feels different.  States she is felt bad the last few days.  Felt a little lightheaded.  History of vertigo but no syncope or lightheadedness like this in the past.  States she has had good oral intake.  Recently started Lizness for her constipation.  Patient took a pain pill this morning.  States she takes Percocet 10 mg 4 times a day. Past Medical History:  Diagnosis Date  . Alcohol abuse    a. quit several years ago.  . Allergy   . Anemia    younger  . Arthritis    feet,knees  . B12 deficiency   . BPPV (benign paroxysmal positional vertigo)   . Cirrhosis ([place])   . Complication of anesthesia    "slow to wake"  . Constipation    on movantik- stools regular and soft on this  . Foot drop   . Hyperlipidemia   . Hypothyroidism   . Neuromuscular disorder (HCC)    idiopathic neuropathy  . Spinal stenosis   . Tobacco abuse     Patient Active Problem List   Diagnosis Date Noted  . Chronic pain 05/07/2016  . Thrombocytopenia ([place]) 04/21/2016  . B12 deficiency 04/21/2016  . PERIPHERAL NEUROPATHY 09/17/2009  . Alcoholic cirrhosis of liver ([place]) 10/02/2008  . JAUNDICE 10/02/2008  . Abdominal pain, generalized 10/02/2008  . Other ascites 10/02/2008  . 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 ALCOHOL ABUSE 03/16/2008  . TRANSAMINASES, SERUM,  ELEVATED 03/16/2008  . SCARLET FEVER 03/14/2008  . HYPERLIPIDEMIA 03/14/2008  . EXTERNAL HEMORRHOIDS 03/14/2008  . HERNIA, UMBILICAL 88/91/6945  . ENDOMETRIOSIS 03/14/2008  . DYSMENORRHEA 03/14/2008  . INSOMNIA UNSPECIFIED 03/14/2008  . HEPATOMEGALY 03/14/2008  . SPLENOMEGALY 03/14/2008  . LIVER FUNCTION TESTS, ABNORMAL, HX OF 03/14/2008  . ANAL FISSURE, HX OF 03/14/2008  . ALLERGIC RHINITIS 05/31/2007  . GERD 05/31/2007  . OSTEOARTHRITIS 05/31/2007    Past Surgical History:  Procedure Laterality Date  . ANKLE SURGERY    . CERVICAL CONE BIOPSY    . CESAREAN SECTION    . COLONOSCOPY    . HERNIA REPAIR    . KNEE SURGERY     x5  . POLYPECTOMY    . SPINAL CORD STIMULATOR INSERTION N/A 05/07/2016   Procedure: LUMBAR SPINAL CORD STIMULATOR INSERTION;  Surgeon: Melina Schools, MD;  Location: [facility];  Service: Orthopedics;  Laterality: N/A;     OB History   None      Home Medications    Prior to Admission medications   Medication Sig Start Date End Date Taking? Authorizing Provider  famotidine (PEPCID) 20 MG tablet Take 20 mg by mouth 3 (three) times daily. 05/26/18  Yes [provider]  folic acid (FOLVITE) 1 MG tablet Take 1 mg by mouth daily.  04/01/16  Yes [provider]  hydrochlorothiazide (HYDRODIURIL) 12.5 MG tablet Take 12.5 mg by mouth daily.  03/24/16  Yes [provider]  ibuprofen (ADVIL,MOTRIN) 800 MG tablet Take 800 mg by mouth 3 (three) times daily. 05/19/18  Yes [provider]  levothyroxine (SYNTHROID) 25 MCG tablet Take 25 mcg by mouth daily before breakfast.  03/31/16  Yes [provider]  oxyCODONE-acetaminophen (PERCOCET) 10-325 MG tablet Take 1 tablet by mouth every 4 (four) hours as needed for pain. Patient taking differently: Take 1 tablet by mouth every 6 (six) hours as needed for pain.  05/07/16  Yes Melina Schools, MD  pregabalin (LYRICA) 150 MG capsule Take 150 mg by mouth 3 (three) times daily.  11/15/15  Yes  [provider]  cyanocobalamin (,VITAMIN B-12,) 1000 MCG/ML injection Inject 1,000 mcg into the muscle once a week.  03/11/16   [provider]  Ibuprofen-Famotidine (DUEXIS) 800-26.6 MG TABS TAKE 1 TABLET 3 TIMES A DAY 06/16/16   [provider]  naloxegol oxalate (MOVANTIK) 12.5 MG TABS tablet Take 12.5 mg by mouth daily.     [provider]  rosuvastatin (CRESTOR) 10 MG tablet Take 10 mg by mouth.    [provider]  tiZANidine (ZANAFLEX) 4 MG tablet Take 4 mg by mouth every 8 (eight) hours as needed for muscle spasms.  05/14/16   [provider]  Vitamin D, Ergocalciferol, (DRISDOL) 50000 units CAPS capsule Take 50,000 Units by mouth 2 (two) times a week. Sunday and Wednesday 03/16/16   [provider]    Family History Family History  Adopted: Yes    Social History Social History   Tobacco Use  . Smoking status: Current Every Day Smoker    Packs/day: 1.00    Years: 30.00    Pack years: 30.00    Types: Cigarettes  . Smokeless tobacco: Never Used  Substance Use Topics  . Alcohol use: Not Currently    Comment: Prior alcohol abuse - quit several years ago.  . Drug use: No     Allergies   Vancomycin and Sulfamethoxazole   Review of Systems Review of Systems  Constitutional: Negative for appetite change.  HENT: Negative for dental problem.   Cardiovascular: Negative for chest pain.  Gastrointestinal: Negative for abdominal distention.  Genitourinary: Negative for flank pain.  Musculoskeletal: Negative for back pain.       Left ankle pain with deformity.  Skin: Positive for wound. Negative for rash.  Neurological: Positive for light-headedness.  Hematological: Negative for adenopathy.  Psychiatric/Behavioral: Negative for confusion.     Physical Exam Updated Vital Signs BP (!) 118/106   Pulse 87   Temp 97.6 F (36.4 C)   Resp 17   Ht 5\' 9"  (1.753 m)   Wt 117.9 kg   LMP  (LMP Unknown) Comment: Pt under  anesthesia in OR  SpO2 99%   BMI 38.40 kg/m   Physical Exam  Constitutional: She appears well-developed.  HENT:  Head: Atraumatic.  Neck: Neck supple.  Cardiovascular:  Patient is having periods of bradycardia.  Pulmonary/Chest: Effort normal.  Abdominal: There is no tenderness.  Musculoskeletal: She exhibits deformity.  Tenderness over left proximal fibula.  Tenderness over distal fibula on left.  Deformity with ecchymosis and shallow ulcer in the skin of the left medial ankle.  Neurovascular intact.  No tenderness over hip back or upper extremities.  Neurological: She is alert.  Skin: Skin is warm. Capillary refill takes less than 2 seconds.  ED Treatments / Results  Labs (all labs ordered are listed, but only abnormal results are displayed) Labs Reviewed  CBC WITH DIFFERENTIAL/PLATELET - Abnormal; Notable for the following components:      Result Value   Platelets 121 (*)    All other components within normal limits  COMPREHENSIVE METABOLIC PANEL - Abnormal; Notable for the following components:   Potassium 3.4 (*)    Glucose, Bld 129 (*)    All other components within normal limits  TROPONIN I  TSH  MAGNESIUM    EKG EKG Interpretation  Date/Time:  Tuesday June 01 2018 10:29:13 EDT Ventricular Rate:  124 PR Interval:    QRS Duration: 149 QT Interval:  412 QTC Calculation: 592 R Axis:   -55 Text Interpretation:  Atrial flutter with predominant 2:1 AV block Left bundle branch block Artifact in lead(s) II III aVR aVL aVF V3 V4 V5 V6 Confirmed by Davonna Belling [phone]) on 06/01/2018 10:45:23 AM   Radiology Dg Ankle Complete Left  Result Date: 06/01/2018 CLINICAL DATA:  57 year old female status post fall at work with severe ankle pain. EXAM: LEFT ANKLE COMPLETE - 3+ VIEW COMPARISON:  Left ankle series 07/25/2006. FINDINGS: Oblique comminuted fracture of the distal left fibula metadiaphysis with lateral angulation and mild lateral displacement. Associated  lateral subluxation of the mortise joint with combined acute and chronic medial malleolus bone fragments. Associated abnormal widening of the tibiofibular syndesmosis. No other distal tibia fracture. Mild irregularity of the talar dome appears somewhat non acute. Calcaneus appears stable and intact. There is fragmentation of the navicular bone which is new since 2007 and appears to be associated with new but chronic TMT joint degeneration. IMPRESSION: 1. Acute comminuted fracture of the distal left fibula metadiaphysis with mild lateral displacement and angulation. 2. Associated injury to the tibia-fibular syndesmosis, small avulsions at the medial malleolus, and lateral subluxation of the mortise joint. 3. Age indeterminate but probably chronic fragmentation of the navicular, in association with other chronic distal tarsal bone degeneration. Electronically Signed   By: Genevie Ann M.D.   On: 06/01/2018 08:52   Dg Knee Complete 4 Views Left  Result Date: 06/01/2018 CLINICAL DATA:  [place] at work today.  Leg pain. EXAM: LEFT KNEE - COMPLETE 4+ VIEW COMPARISON:  None. FINDINGS: Osteoarthritis, most pronounced in the medial compartment and patellofemoral joint. Several intra articular loose bodies. Moderate joint effusion. No evidence of fracture. IMPRESSION: Advanced chronic osteoarthritis. Loose bodies. Joint effusion. No acute fracture. Electronically Signed   By: Nelson Chimes M.D.   On: 06/01/2018 09:46    Procedures Procedures (including critical care time)  Medications Ordered in ED Medications  HYDROmorphone (DILAUDID) injection 1 mg (has no administration in time range)  HYDROmorphone (DILAUDID) injection 1 mg (1 mg Intravenous Given 06/01/18 0858)     Initial Impression / Assessment and Plan / ED Course  I have reviewed the triage vital signs and the nursing notes.  Pertinent labs & imaging results that were available during my care of the patient were reviewed by me and considered in my medical  decision making (see chart for details).     Patient felt lightheaded.  Found to have bradycardia in the ER.  Sinus rhythm with some bradycardia.  Has ankle fracture.  With symptomatic bradycardia bad enough for her to fall and break her ankle I think patient would benefit from admission of the hospital.  Discussed with cardiology, who will see the patient.  Discussed with Dr. Wynelle Cleveland from the hospitalist service who  states the patient will need to be evaluated by cardiology and decision for admission made by them.  Also discussed with orthopedic surgery.  Dr. Doran Durand will see patient in the ER.  However patient has now gone into an atrial fib flutter with RVR.  Cardiology has started further treatment.  Hold off on anticoagulation pending operative plan.  Will put short leg splint on patient.  Cardiology request admission to internal medicine for  Final Clinical Impressions(s) / ED Diagnoses   Final diagnoses:  Fall, initial encounter  Bradycardia  Near syncope  Closed fracture of left ankle, initial encounter    ED Discharge Orders    None       Davonna Belling, MD 06/01/18 1027    Davonna Belling, MD 06/01/18 1053

## 2018-06-01 NOTE — ED Notes (Signed)
ED TO INPATIENT HANDOFF REPORT  Name/Age/Gender [name] 57 y.o. female  Code Status Code Status History    Date Active Date Inactive Code Status Order ID Comments User Context   05/07/2016 1429 05/08/2016 1727 Full Code [id number]  Melina Schools, MD Inpatient    Advance Directive Documentation     Most Recent Value  Type of Advance Directive  Healthcare Power of Attorney, Living will  Pre-existing out of facility DNR order (yellow form or pink MOST form)  -  "MOST" Form in Place?  -      Home/SNF/Other Home  Chief Complaint fall   Level of Care/Admitting Diagnosis ED Disposition    None      Medical History Past Medical History:  Diagnosis Date  . Allergy   . Anemia    younger  . Arthritis    feet,knees  . BPPV (benign paroxysmal positional vertigo)   . Complication of anesthesia    "slow to wake"  . Constipation    on movantik- stools regular and soft on this  . Foot drop   . Hyperlipidemia   . Hypothyroidism   . Neuromuscular disorder (HCC)    idiopathic neuropathy    Allergies Allergies  Allergen Reactions  . Vancomycin Hives  . Sulfamethoxazole     GI Upset    IV Location/Drains/Wounds Patient Lines/Drains/Airways Status   Active Line/Drains/Airways    Name:   Placement date:   Placement time:   Site:   Days:   Peripheral IV 06/01/18 Right Forearm   06/01/18    0905    Forearm   less than 1   Incision (Closed) 05/07/16 Back Other (Comment)   05/07/16    1121     755          Labs/Imaging Results for orders placed or performed during the hospital encounter of 06/01/18 (from the past 48 hour(s))  Troponin I     Status: None   Collection Time: 06/01/18  8:59 AM  Result Value Ref Range   Troponin I <0.03 <0.03 ng/mL    Comment: Performed at [facility], [place] [address], [place], [place] [address]  CBC with Differential     Status: Abnormal   Collection Time: 06/01/18  8:59 AM  Result Value Ref Range   WBC 7.3 4.0 -  10.5 K/uL   RBC 4.46 3.87 - 5.11 MIL/uL   Hemoglobin 14.7 12.0 - 15.0 g/dL   HCT 44.6 36.0 - 46.0 %   MCV 100.0 78.0 - 100.0 fL   MCH 33.0 26.0 - 34.0 pg   MCHC 33.0 30.0 - 36.0 g/dL   RDW 14.1 11.5 - 15.5 %   Platelets 121 (L) 150 - 400 K/uL   Neutrophils Relative % 67 %   Neutro Abs 4.8 1.7 - 7.7 K/uL   Lymphocytes Relative 23 %   Lymphs Abs 1.7 0.7 - 4.0 K/uL   Monocytes Relative 7 %   Monocytes Absolute 0.5 0.1 - 1.0 K/uL   Eosinophils Relative 3 %   Eosinophils Absolute 0.3 0.0 - 0.7 K/uL   Basophils Relative 0 %   Basophils Absolute 0.0 0.0 - 0.1 K/uL    Comment: Performed at [facility], [place] [address], [place], [place] [address]  Comprehensive metabolic panel     Status: Abnormal   Collection Time: 06/01/18  8:59 AM  Result Value Ref Range   Sodium 142 135 - 145 mmol/L   Potassium 3.4 (L) 3.5 - 5.1  mmol/L   Chloride 104 98 - 111 mmol/L   CO2 27 22 - 32 mmol/L   Glucose, Bld 129 (H) 70 - 99 mg/dL   BUN 15 6 - 20 mg/dL   Creatinine, Ser 0.92 0.44 - 1.00 mg/dL   Calcium 9.4 8.9 - 10.3 mg/dL   Total Protein 6.7 6.5 - 8.1 g/dL   Albumin 3.7 3.5 - 5.0 g/dL   AST 21 15 - 41 U/L   ALT 16 0 - 44 U/L   Alkaline Phosphatase 70 38 - 126 U/L   Total Bilirubin 0.9 0.3 - 1.2 mg/dL   GFR calc non Af Amer >60 >60 mL/min   GFR calc Af Amer >60 >60 mL/min    Comment: (NOTE) The eGFR has been calculated using the CKD EPI equation. This calculation has not been validated in all clinical situations. eGFR's persistently <60 mL/min signify possible Chronic Kidney Disease.    Anion gap 11 5 - 15    Comment: Performed at [facility], [place] [address] [place], [place] [address]  Magnesium     Status: None   Collection Time: 06/01/18  8:59 AM  Result Value Ref Range   Magnesium 2.1 1.7 - 2.4 mg/dL    Comment: Performed at [facility], [place] [address] [place], [place] [address]  TSH     Status: None   Collection Time: 06/01/18   9:00 AM  Result Value Ref Range   TSH 3.276 0.350 - 4.500 uIU/mL    Comment: Performed by a 3rd Generation assay with a functional sensitivity of <=0.01 uIU/mL. Performed at [facility], [place] [address], [place], [place] [address]    Dg Ankle Complete Left  Result Date: 06/01/2018 CLINICAL DATA:  57 year old female status post fall at work with severe ankle pain. EXAM: LEFT ANKLE COMPLETE - 3+ VIEW COMPARISON:  Left ankle series 07/25/2006. FINDINGS: Oblique comminuted fracture of the distal left fibula metadiaphysis with lateral angulation and mild lateral displacement. Associated lateral subluxation of the mortise joint with combined acute and chronic medial malleolus bone fragments. Associated abnormal widening of the tibiofibular syndesmosis. No other distal tibia fracture. Mild irregularity of the talar dome appears somewhat non acute. Calcaneus appears stable and intact. There is fragmentation of the navicular bone which is new since 2007 and appears to be associated with new but chronic TMT joint degeneration. IMPRESSION: 1. Acute comminuted fracture of the distal left fibula metadiaphysis with mild lateral displacement and angulation. 2. Associated injury to the tibia-fibular syndesmosis, small avulsions at the medial malleolus, and lateral subluxation of the mortise joint. 3. Age indeterminate but probably chronic fragmentation of the navicular, in association with other chronic distal tarsal bone degeneration. Electronically Signed   By: Genevie Ann M.D.   On: 06/01/2018 08:52   Dg Knee Complete 4 Views Left  Result Date: 06/01/2018 CLINICAL DATA:  [place] at work today.  Leg pain. EXAM: LEFT KNEE - COMPLETE 4+ VIEW COMPARISON:  None. FINDINGS: Osteoarthritis, most pronounced in the medial compartment and patellofemoral joint. Several intra articular loose bodies. Moderate joint effusion. No evidence of fracture. IMPRESSION: Advanced chronic osteoarthritis. Loose bodies. Joint  effusion. No acute fracture. Electronically Signed   By: Nelson Chimes M.D.   On: 06/01/2018 09:46    Pending Labs Unresulted Labs (From admission, onward)   None      Vitals/Pain Today's Vitals   06/01/18 0919 06/01/18 0930 06/01/18 1000 06/01/18 1001  BP:  128/82  (!) 169/83  Pulse:  66 (!) 56 (!) 56  Resp:  14  16  Temp:      SpO2:  97%  95%  Weight:      Height:      PainSc: 4        Isolation Precautions No active isolations  Medications Medications  HYDROmorphone (DILAUDID) injection 1 mg (1 mg Intravenous Given 06/01/18 0858)    Mobility Walks (prior to this incident)

## 2018-06-01 NOTE — Hospital Discharge Follow-Up (Signed)
History and Physical    [name]  YYQ:[id number]  DOB: 1961-03-21  DOA: 06/01/2018 PCP: Aletha Halim., PA-C   Patient coming from: home  Chief Complaint: near syncope  HPI: [name] is a 57 y.o. 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 She fell to the floor but did not past out. She instantly felt pain in her left ankle and crawled to her desk where she called for help.  She had no focal neurological issues other than blurred vision. She has a h/o of BPPV but this did not feel like an episode of vertigo. She did not have chest pain palpitation, diaphoresis, nausea or vomiting.    ED Course: HR found to be fluctuating from sinus brady 40s to A-flutter with RVR. She has a left ankle fracture. Cardiology and ortho have been called. She has received pain medication & oral Cardizem.   Review of Systems:  Continues to have issues with constipation. All other systems reviewed and apart from HPI, are negative.  Past Medical History:  Diagnosis Date  . Alcohol abuse    a. quit several years ago.  . Allergy   . Anemia    younger  . Arthritis    feet,knees  . B12 deficiency   . BPPV (benign paroxysmal positional vertigo)   . Cirrhosis ([place])   . Complication of anesthesia    "slow to wake"  . Constipation    on movantik- stools regular and soft on this  . Foot drop   . Hyperlipidemia   . Hypothyroidism   . Neuromuscular disorder ([facility])    idiopathic neuropathy  . Spinal stenosis   . Tobacco abuse     Past Surgical History:  Procedure Laterality Date  . ANKLE SURGERY    . CERVICAL CONE BIOPSY    . CESAREAN SECTION    . COLONOSCOPY    . HERNIA REPAIR    .  KNEE SURGERY     x5  . POLYPECTOMY    . SPINAL CORD STIMULATOR INSERTION N/A 05/07/2016   Procedure: LUMBAR SPINAL CORD STIMULATOR INSERTION;  Surgeon: Melina Schools, MD;  Location: [facility];  Service: Orthopedics;  Laterality: N/A;    Social History:   Reports that she has been smoking cigarettes, about 1ppd. She has a 30.00 pack-year smoking history. She has never used smokeless tobacco. She reports that she drink about 2 cocktails a week. She reports that she does not use drugs.  Allergies  Allergen Reactions  . Vancomycin Hives  . Sulfamethoxazole     GI Upset    Family History  Adopted: Yes     Prior to Admission medications   Medication Sig Start Date End Date Taking? Authorizing Provider  famotidine (PEPCID) 20 MG tablet Take 20 mg by mouth 3 (three) times daily. 05/26/18  Yes [provider]  folic acid (FOLVITE) 1 MG tablet Take 1 mg by mouth daily.  04/01/16  Yes [provider]  hydrochlorothiazide (HYDRODIURIL) 12.5 MG tablet Take 12.5 mg by mouth daily.  03/24/16  Yes [provider]  ibuprofen (ADVIL,MOTRIN) 800 MG tablet Take 800 mg by mouth 3 (three) times daily. 05/19/18  Yes [provider]  levothyroxine (SYNTHROID) 25 MCG tablet  Take 25 mcg by mouth daily before breakfast.  03/31/16  Yes [provider]  oxyCODONE-acetaminophen (PERCOCET) 10-325 MG tablet Take 1 tablet by mouth every 4 (four) hours as needed for pain. Patient taking differently: Take 1 tablet by mouth every 6 (six) hours as needed for pain.  05/07/16  Yes Melina Schools, MD  pregabalin (LYRICA) 150 MG capsule Take 150 mg by mouth 3 (three) times daily.  11/15/15  Yes [provider]  cyanocobalamin (,VITAMIN B-12,) 1000 MCG/ML injection Inject 1,000 mcg into the muscle once a week.  03/11/16   [provider]  Ibuprofen-Famotidine (DUEXIS) 800-26.6 MG TABS TAKE 1 TABLET 3 TIMES A DAY 06/16/16   [provider]  naloxegol oxalate (MOVANTIK)  12.5 MG TABS tablet Take 12.5 mg by mouth daily.     [provider]  rosuvastatin (CRESTOR) 10 MG tablet Take 10 mg by mouth.    [provider]  tiZANidine (ZANAFLEX) 4 MG tablet Take 4 mg by mouth every 8 (eight) hours as needed for muscle spasms.  05/14/16   [provider]  Vitamin D, Ergocalciferol, (DRISDOL) 50000 units CAPS capsule Take 50,000 Units by mouth 2 (two) times a week. Sunday and Wednesday 03/16/16   [provider]    Physical Exam: Wt Readings from Last 3 Encounters:  06/01/18 117.9 kg  10/02/16 110.7 kg  09/18/16 110.7 kg   Vitals:   06/01/18 1001 06/01/18 1030 06/01/18 1100 06/01/18 1207  BP: (!) 169/83 (!) 118/106 (!) 143/103 104/76  Pulse: (!) 56 87 (!) 51 63  Resp: 16 17 (!) 26 19  Temp:      SpO2: 95% 99% 94% 96%  Weight:      Height:          Constitutional:  Calm & comfortable Eyes: PERRLA, lids and conjunctivae normal ENT:  Mucous membranes are moist.  Pharynx clear of exudate   Normal dentition.  Neck: Supple, no masses  Respiratory:  Clear to auscultation bilaterally  Normal respiratory effort.  Cardiovascular:  S1 & S2 heard, regular rate and rhythm No Murmurs Abdomen:  Non distended No tenderness, No masses Bowel sounds normal Extremities:  - left ankle and leg is in splint No pedal edema Skin:  No rashes, lesions or ulcers Neurologic:  AAO x 3 CN 2-12 grossly intact Sensation intact Strength 5/5 in all 4 extremities Psychiatric:  Normal Mood and affect    Labs on Admission: I have personally reviewed following labs and imaging studies  CBC: Recent Labs  Lab 06/01/18 0859  WBC 7.3  NEUTROABS 4.8  HGB 14.7  HCT 44.6  MCV 100.0  PLT 654*   Basic Metabolic Panel: Recent Labs  Lab 06/01/18 0859  NA 142  K 3.4*  CL 104  CO2 27  GLUCOSE 129*  BUN 15  CREATININE 0.92  CALCIUM 9.4  MG 2.1   GFR: Estimated Creatinine Clearance: 93.7 mL/min (by C-G formula based on SCr of  0.92 mg/dL). Liver Function Tests: Recent Labs  Lab 06/01/18 0859  AST 21  ALT 16  ALKPHOS 70  BILITOT 0.9  PROT 6.7  ALBUMIN 3.7   No results for input(s): LIPASE, AMYLASE in the last 168 hours. No results for input(s): AMMONIA in the last 168 hours. Coagulation Profile: No results for input(s): INR, PROTIME in the last 168 hours. Cardiac Enzymes: Recent Labs  Lab 06/01/18 0859  TROPONINI <0.03   BNP (last 3 results) No results for input(s): PROBNP in the last 8760 hours.  HbA1C: No results for input(s): HGBA1C in the last 72 hours. CBG: No results for input(s): GLUCAP in the last 168 hours. Lipid Profile: No results for input(s): CHOL, HDL, LDLCALC, TRIG, CHOLHDL, LDLDIRECT in the last 72 hours. Thyroid Function Tests: Recent Labs    06/01/18 0900  TSH 3.276   Anemia Panel: No results for input(s): VITAMINB12, FOLATE, FERRITIN, TIBC, IRON, RETICCTPCT in the last 72 hours. Urine analysis: No results found for: COLORURINE, APPEARANCEUR, LABSPEC, PHURINE, GLUCOSEU, HGBUR, BILIRUBINUR, KETONESUR, PROTEINUR, UROBILINOGEN, NITRITE, LEUKOCYTESUR Sepsis Labs: @LABRCNTIP (procalcitonin:4,lacticidven:4) )No results found for this or any previous visit (from the past 240 hour(s)).   Radiological Exams on Admission: Dg Ankle Complete Left  Result Date: 06/01/2018 CLINICAL DATA:  57 year old female status post fall at work with severe ankle pain. EXAM: LEFT ANKLE COMPLETE - 3+ VIEW COMPARISON:  Left ankle series 07/25/2006. FINDINGS: Oblique comminuted fracture of the distal left fibula metadiaphysis with lateral angulation and mild lateral displacement. Associated lateral subluxation of the mortise joint with combined acute and chronic medial malleolus bone fragments. Associated abnormal widening of the tibiofibular syndesmosis. No other distal tibia fracture. Mild irregularity of the talar dome appears somewhat non acute. Calcaneus appears stable and intact. There is fragmentation  of the navicular bone which is new since 2007 and appears to be associated with new but chronic TMT joint degeneration. IMPRESSION: 1. Acute comminuted fracture of the distal left fibula metadiaphysis with mild lateral displacement and angulation. 2. Associated injury to the tibia-fibular syndesmosis, small avulsions at the medial malleolus, and lateral subluxation of the mortise joint. 3. Age indeterminate but probably chronic fragmentation of the navicular, in association with other chronic distal tarsal bone degeneration. Electronically Signed   By: Genevie Ann M.D.   On: 06/01/2018 08:52   Ct Head Wo Contrast  Result Date: 06/01/2018 CLINICAL DATA:  Focal neuro deficit, greater than 6 hours stroke suspected. Altered level of consciousness. Fall today with possible loss consciousness. EXAM: CT HEAD WITHOUT CONTRAST TECHNIQUE: Contiguous axial images were obtained from the base of the skull through the vertex without intravenous contrast. COMPARISON:  None. FINDINGS: Brain: No acute infarct, hemorrhage, or mass lesion is present. The ventricles are of normal size. No significant extraaxial fluid collection is present. No significant white matter disease is present. Brainstem and cerebellum are normal. Vascular: No hyperdense vessel or unexpected calcification. Skull: Calvarium is intact. No focal lytic or blastic lesions are present. No significant extracranial soft tissue injury is present. Sinuses/Orbits: The paranasal sinuses and mastoid air cells are clear. Globes and orbits are within normal limits. IMPRESSION: Negative CT of the head. Electronically Signed   By: San Morelle M.D.   On: 06/01/2018 12:29   Dg Knee Complete 4 Views Left  Result Date: 06/01/2018 CLINICAL DATA:  [place] at work today.  Leg pain. EXAM: LEFT KNEE - COMPLETE 4+ VIEW COMPARISON:  None. FINDINGS: Osteoarthritis, most pronounced in the medial compartment and patellofemoral joint. Several intra articular loose bodies. Moderate  joint effusion. No evidence of fracture. IMPRESSION: Advanced chronic osteoarthritis. Loose bodies. Joint effusion. No acute fracture. Electronically Signed   By: Nelson Chimes M.D.   On: 06/01/2018 09:46    EKG: Independently reviewed. Sinus brady @ 54 bmp. Second EKG reveals A-flutter at 124 bpm.  Assessment/Plan Principal Problem:   Near syncope - ? Related to tachy/brady episodes - ? If she was orthostatic as she is taking HCTZ - ? If CVA but less likely as she had no focal symptoms- CT head negative  Active Problems:   PAF (paroxysmal atrial fibrillation)  - with possible underlying tachy/brady syndrome - cardiology assisting with management- trial of short acting Cardizem planned - f/u ECHO, check TSH/ free T4    Closed left ankle fracture - ortho has been called by ED- a splint has been ordered and placed - pain medications ordered - DVT prophylaxis ordered  Left knee osteoarthritis and effusion - cont home pain meds    Benign essential HTN - would hold HCTZ today- Cardizem being started  Hypokalemia - suspect this is related to HCTZ -  she has been told to eat bananas daily by per PCP but she hates them- replace   Peripheral neuropathy/ Chronic pain - she follows with pain management - cont Oxycodone/ APAP, Lyrica, Ibuprofen, Zanaflex    Alcoholic cirrhosis of liver   Splenomegaly   Thrombocytopenia  - continues to drink 1-2 x week   Hypothyroid - checking TFTs  Constipation - Linzess  HLD - Crestor  COPD - Breo-ellipta and Albuterol ordered  Nicotine abuse - Nicotine patch ordered- counseled  GERD - Pepcid     DVT prophylaxis: Lovenox Code Status: Full code  Family Communication: daughter  Disposition Plan: telemetry  Consults called: ortho and cardiology called by ED  Admission status: inpatient    Debbe Odea MD [facility] Pager: [email or web address] Password [email or web address] 7PM-7AM, please contact night-coverage   06/01/2018, 12:50 PM

## 2018-06-01 NOTE — ED Triage Notes (Addendum)
Per EMS- Patient was at work, felt dizzy and fell. Patient has an obvious deformity to the left ankle. Patient denies LOC or hitting her head. Patient did have a hydrocodone at 0400.  Patient states she did have LOC and not sure if she hit her head, but does not feel as if she did.

## 2018-06-02 ENCOUNTER — Inpatient Hospital Stay (HOSPITAL_COMMUNITY): Payer: BC Managed Care – PPO | Admitting: Anesthesiology

## 2018-06-02 ENCOUNTER — Encounter (HOSPITAL_COMMUNITY)
Admission: EM | Disposition: A | Discharge status: Home Health | Payer: Self-pay | Source: Home / Self Care | Attending: Internal Medicine

## 2018-06-02 ENCOUNTER — Encounter (HOSPITAL_COMMUNITY): Payer: Self-pay | Admitting: Anesthesiology

## 2018-06-02 DIAGNOSIS — E538 Deficiency of other specified B group vitamins: Secondary | ICD-10-CM

## 2018-06-02 DIAGNOSIS — I480 Paroxysmal atrial fibrillation: Secondary | ICD-10-CM

## 2018-06-02 HISTORY — PX: ORIF ANKLE FRACTURE: SHX5408

## 2018-06-02 LAB — CBC
HEMATOCRIT: 41.6 % (ref 36.0–46.0)
HEMOGLOBIN: 13.3 g/dL (ref 12.0–15.0)
MCH: 32.2 pg (ref 26.0–34.0)
MCHC: 32 g/dL (ref 30.0–36.0)
MCV: 100.7 fL — ABNORMAL HIGH (ref 78.0–100.0)
Platelets: 128 10*3/uL — ABNORMAL LOW (ref 150–400)
RBC: 4.13 MIL/uL (ref 3.87–5.11)
RDW: 14.4 % (ref 11.5–15.5)
WBC: 5.7 10*3/uL (ref 4.0–10.5)

## 2018-06-02 LAB — HIV ANTIBODY (ROUTINE TESTING W REFLEX): HIV Screen 4th Generation wRfx: NONREACTIVE

## 2018-06-02 LAB — LIPID PANEL
CHOL/HDL RATIO: 3.1 ratio
Cholesterol: 119 mg/dL (ref 0–200)
HDL: 39 mg/dL — ABNORMAL LOW (ref >40)
LDL Cholesterol: 59 mg/dL (ref 0–99)
TRIGLYCERIDES: 104 mg/dL (ref <150)
VLDL: 21 mg/dL (ref 0–40)

## 2018-06-02 LAB — T4, FREE: Free T4: 1.19 ng/dL (ref 0.82–1.77)

## 2018-06-02 LAB — BASIC METABOLIC PANEL
ANION GAP: 10 (ref 5–15)
BUN: 17 mg/dL (ref 6–20)
CO2: 28 mmol/L (ref 22–32)
Calcium: 9.2 mg/dL (ref 8.9–10.3)
Chloride: 104 mmol/L (ref 98–111)
Creatinine, Ser: 0.68 mg/dL (ref 0.44–1.00)
GFR calc non Af Amer: >60 mL/min (ref >60)
GLUCOSE: 123 mg/dL — AB (ref 70–99)
POTASSIUM: 3.4 mmol/L — AB (ref 3.5–5.1)
Sodium: 142 mmol/L (ref 135–145)

## 2018-06-02 LAB — TSH: TSH: 1.225 u[IU]/mL (ref 0.350–4.500)

## 2018-06-02 LAB — PHOSPHORUS: PHOSPHORUS: 3.7 mg/dL (ref 2.5–4.6)

## 2018-06-02 LAB — MAGNESIUM: MAGNESIUM: 2 mg/dL (ref 1.7–2.4)

## 2018-06-02 SURGERY — OPEN REDUCTION INTERNAL FIXATION (ORIF) ANKLE FRACTURE
Anesthesia: General | Site: Ankle | Laterality: Left

## 2018-06-02 MED ORDER — MIDAZOLAM HCL 5 MG/5ML IJ SOLN
INTRAMUSCULAR | Status: DC | PRN
  Administered 2018-06-02 (×2): 1 mg via INTRAVENOUS

## 2018-06-02 MED ORDER — LACTATED RINGERS IV SOLN
INTRAVENOUS | Status: DC
  Administered 2018-06-02: 12:00:00 via INTRAVENOUS

## 2018-06-02 MED ORDER — DOCUSATE SODIUM 100 MG PO CAPS
100.0000 mg | ORAL_CAPSULE | Freq: Two times a day (BID) | ORAL | Status: DC
  Administered 2018-06-02 – 2018-06-03 (×2): 100 mg via ORAL
  Filled 2018-06-02 (×2): qty 1

## 2018-06-02 MED ORDER — ENOXAPARIN SODIUM 40 MG/0.4ML ~~LOC~~ SOLN
40.0000 mg | SUBCUTANEOUS | Status: DC
  Administered 2018-06-03: 40 mg via SUBCUTANEOUS
  Filled 2018-06-02: qty 0.4

## 2018-06-02 MED ORDER — FENTANYL CITRATE (PF) 100 MCG/2ML IJ SOLN
50.0000 ug | INTRAMUSCULAR | Status: AC
  Administered 2018-06-02: 50 ug via INTRAVENOUS
  Filled 2018-06-02: qty 2

## 2018-06-02 MED ORDER — MEPERIDINE HCL 50 MG/ML IJ SOLN: 6.2500 mg | INTRAMUSCULAR | Status: DC | PRN

## 2018-06-02 MED ORDER — BUPIVACAINE LIPOSOME 1.3 % IJ SUSP
20.0000 mL | Freq: Once | INTRAMUSCULAR | Status: DC
  Filled 2018-06-02: qty 20

## 2018-06-02 MED ORDER — HYDROMORPHONE HCL 1 MG/ML IJ SOLN
INTRAMUSCULAR | Status: AC
  Filled 2018-06-02: qty 1

## 2018-06-02 MED ORDER — MAGNESIUM CITRATE PO SOLN
1.0000 | Freq: Once | ORAL | Status: AC | PRN
  Administered 2018-06-03: 1 via ORAL
  Filled 2018-06-02: qty 296

## 2018-06-02 MED ORDER — LACTATED RINGERS IV SOLN: INTRAVENOUS | Status: DC

## 2018-06-02 MED ORDER — BACITRACIN ZINC 500 UNIT/GM EX OINT
TOPICAL_OINTMENT | CUTANEOUS | Status: AC
  Filled 2018-06-02: qty 28.35

## 2018-06-02 MED ORDER — FENTANYL CITRATE (PF) 100 MCG/2ML IJ SOLN
INTRAMUSCULAR | Status: AC
  Filled 2018-06-02: qty 2

## 2018-06-02 MED ORDER — SODIUM CHLORIDE 0.9 % IV SOLN
INTRAVENOUS | Status: DC
  Administered 2018-06-02 – 2018-06-03 (×2): via INTRAVENOUS

## 2018-06-02 MED ORDER — HYDROMORPHONE HCL 1 MG/ML IJ SOLN: 0.5000 mg | INTRAMUSCULAR | Status: DC | PRN

## 2018-06-02 MED ORDER — POLYETHYLENE GLYCOL 3350 17 G PO PACK: 17.0000 g | PACK | Freq: Every day | ORAL | Status: DC | PRN

## 2018-06-02 MED ORDER — NAPROXEN 250 MG PO TABS
250.0000 mg | ORAL_TABLET | Freq: Two times a day (BID) | ORAL | Status: DC
  Administered 2018-06-02 – 2018-06-03 (×2): 250 mg via ORAL
  Filled 2018-06-02 (×3): qty 1

## 2018-06-02 MED ORDER — METHOCARBAMOL 500 MG IVPB - SIMPLE MED
500.0000 mg | Freq: Once | INTRAVENOUS | Status: AC
  Administered 2018-06-02: 500 mg via INTRAVENOUS

## 2018-06-02 MED ORDER — BUPIVACAINE-EPINEPHRINE (PF) 0.5% -1:200000 IJ SOLN
INTRAMUSCULAR | Status: DC | PRN
  Administered 2018-06-02: 30 mL via PERINEURAL

## 2018-06-02 MED ORDER — OXYCODONE HCL 5 MG PO TABS
10.0000 mg | ORAL_TABLET | ORAL | Status: DC | PRN
  Administered 2018-06-02 – 2018-06-03 (×2): 15 mg via ORAL
  Filled 2018-06-02 (×4): qty 3

## 2018-06-02 MED ORDER — MIDAZOLAM HCL 2 MG/2ML IJ SOLN
INTRAMUSCULAR | Status: AC
  Filled 2018-06-02: qty 2

## 2018-06-02 MED ORDER — PHENYLEPHRINE 40 MCG/ML (10ML) SYRINGE FOR IV PUSH (FOR BLOOD PRESSURE SUPPORT)
PREFILLED_SYRINGE | INTRAVENOUS | Status: AC
  Filled 2018-06-02: qty 10

## 2018-06-02 MED ORDER — LIDOCAINE 2% (20 MG/ML) 5 ML SYRINGE
INTRAMUSCULAR | Status: DC | PRN
  Administered 2018-06-02: 50 mg via INTRAVENOUS

## 2018-06-02 MED ORDER — SODIUM CHLORIDE 0.9 % IJ SOLN
INTRAMUSCULAR | Status: AC
  Filled 2018-06-02: qty 20

## 2018-06-02 MED ORDER — OXYCODONE HCL 5 MG/5ML PO SOLN
5.0000 mg | Freq: Once | ORAL | Status: DC | PRN
  Filled 2018-06-02: qty 5

## 2018-06-02 MED ORDER — PROPOFOL 10 MG/ML IV BOLUS
INTRAVENOUS | Status: DC | PRN
  Administered 2018-06-02: 180 mg via INTRAVENOUS

## 2018-06-02 MED ORDER — OXYCODONE HCL 5 MG PO TABS
5.0000 mg | ORAL_TABLET | ORAL | Status: DC | PRN
  Administered 2018-06-03: 10 mg via ORAL

## 2018-06-02 MED ORDER — BUPIVACAINE LIPOSOME 1.3 % IJ SUSP
INTRAMUSCULAR | Status: DC | PRN
  Administered 2018-06-02: 20 mL

## 2018-06-02 MED ORDER — PROMETHAZINE HCL 25 MG/ML IJ SOLN: 6.2500 mg | INTRAMUSCULAR | Status: DC | PRN

## 2018-06-02 MED ORDER — BUPIVACAINE-EPINEPHRINE (PF) 0.25% -1:200000 IJ SOLN
INTRAMUSCULAR | Status: DC | PRN
  Administered 2018-06-02: 30 mL via PERINEURAL

## 2018-06-02 MED ORDER — FENTANYL CITRATE (PF) 100 MCG/2ML IJ SOLN
INTRAMUSCULAR | Status: DC | PRN
  Administered 2018-06-02: 25 ug via INTRAVENOUS
  Administered 2018-06-02: 75 ug via INTRAVENOUS

## 2018-06-02 MED ORDER — METOCLOPRAMIDE HCL 5 MG/ML IJ SOLN: 5.0000 mg | Freq: Three times a day (TID) | INTRAMUSCULAR | Status: DC | PRN

## 2018-06-02 MED ORDER — PROPOFOL 10 MG/ML IV BOLUS
INTRAVENOUS | Status: AC
  Filled 2018-06-02: qty 20

## 2018-06-02 MED ORDER — HYDROMORPHONE HCL 1 MG/ML IJ SOLN: 0.2500 mg | INTRAMUSCULAR | Status: DC | PRN

## 2018-06-02 MED ORDER — MIDAZOLAM HCL 2 MG/2ML IJ SOLN
1.0000 mg | INTRAMUSCULAR | Status: AC
  Administered 2018-06-02: 2 mg via INTRAVENOUS
  Filled 2018-06-02: qty 2

## 2018-06-02 MED ORDER — METHOCARBAMOL 500 MG IVPB - SIMPLE MED
INTRAVENOUS | Status: AC
  Filled 2018-06-02: qty 50

## 2018-06-02 MED ORDER — METOCLOPRAMIDE HCL 5 MG PO TABS: 5.0000 mg | ORAL_TABLET | Freq: Three times a day (TID) | ORAL | Status: DC | PRN

## 2018-06-02 MED ORDER — SENNA 8.6 MG PO TABS
1.0000 | ORAL_TABLET | Freq: Two times a day (BID) | ORAL | Status: DC
  Administered 2018-06-02 – 2018-06-03 (×2): 8.6 mg via ORAL
  Filled 2018-06-02 (×2): qty 1

## 2018-06-02 MED ORDER — OXYCODONE HCL 5 MG PO TABS: 5.0000 mg | ORAL_TABLET | Freq: Once | ORAL | Status: DC | PRN

## 2018-06-02 MED ORDER — ONDANSETRON HCL 4 MG/2ML IJ SOLN
INTRAMUSCULAR | Status: DC | PRN
  Administered 2018-06-02: 4 mg via INTRAVENOUS

## 2018-06-02 MED ORDER — ONDANSETRON HCL 4 MG/2ML IJ SOLN: 4.0000 mg | Freq: Four times a day (QID) | INTRAMUSCULAR | Status: DC | PRN

## 2018-06-02 MED ORDER — POTASSIUM CHLORIDE 10 MEQ/100ML IV SOLN
10.0000 meq | INTRAVENOUS | Status: AC
  Administered 2018-06-02 (×2): 10 meq via INTRAVENOUS
  Filled 2018-06-02 (×3): qty 100

## 2018-06-02 MED ORDER — ACETAMINOPHEN 325 MG PO TABS: 325.0000 mg | ORAL_TABLET | Freq: Four times a day (QID) | ORAL | Status: DC | PRN

## 2018-06-02 MED ORDER — ONDANSETRON HCL 4 MG PO TABS: 4.0000 mg | ORAL_TABLET | Freq: Four times a day (QID) | ORAL | Status: DC | PRN

## 2018-06-02 MED ORDER — DEXAMETHASONE SODIUM PHOSPHATE 10 MG/ML IJ SOLN
INTRAMUSCULAR | Status: DC | PRN
  Administered 2018-06-02: 10 mg via INTRAVENOUS

## 2018-06-02 MED ORDER — BISACODYL 10 MG RE SUPP: 10.0000 mg | Freq: Every day | RECTAL | Status: DC | PRN

## 2018-06-02 SURGICAL SUPPLY — 66 items
ANCH SUT 1 SHRT SM RGD INSRTR (Anchor) ×1 IMPLANT
ANCHOR SUT 1.45 SZ 1 SHORT (Anchor) ×1 IMPLANT
BAG SPEC THK2 15X12 ZIP CLS (MISCELLANEOUS)
BAG ZIPLOCK 12X15 (MISCELLANEOUS) ×1 IMPLANT
BANDAGE ACE 4X5 VEL STRL LF (GAUZE/BANDAGES/DRESSINGS) ×2 IMPLANT
BANDAGE ACE 6X5 VEL STRL LF (GAUZE/BANDAGES/DRESSINGS) ×2 IMPLANT
BIT DRILL 2.5X2.75 QC CALB (BIT) ×1 IMPLANT
BIT DRILL 3.5X5.5 QC CALB (BIT) ×1 IMPLANT
BNDG COHESIVE 4X5 TAN STRL (GAUZE/BANDAGES/DRESSINGS) ×2 IMPLANT
BNDG COHESIVE 6X5 TAN STRL LF (GAUZE/BANDAGES/DRESSINGS) ×2 IMPLANT
BNDG GAUZE ELAST 4 BULKY (GAUZE/BANDAGES/DRESSINGS) ×4 IMPLANT
COVER SURGICAL LIGHT HANDLE (MISCELLANEOUS) ×2 IMPLANT
CUFF TOURN SGL QUICK 34 (TOURNIQUET CUFF) ×2
CUFF TRNQT CYL 34X4X40X1 (TOURNIQUET CUFF) ×1 IMPLANT
DECANTER SPIKE VIAL GLASS SM (MISCELLANEOUS) ×2 IMPLANT
DRAPE C-ARM 42X120 X-RAY (DRAPES) ×2 IMPLANT
DRAPE OEC MINIVIEW 54X84 (DRAPES) ×2 IMPLANT
DRAPE ORTHO SPLIT 77X108 STRL (DRAPES) ×4
DRAPE SURG ORHT 6 SPLT 77X108 (DRAPES) ×2 IMPLANT
DRSG ADAPTIC 3X8 NADH LF (GAUZE/BANDAGES/DRESSINGS) ×1 IMPLANT
DRSG MEPITEL 8X12 (GAUZE/BANDAGES/DRESSINGS) ×1 IMPLANT
DRSG PAD ABDOMINAL 8X10 ST (GAUZE/BANDAGES/DRESSINGS) ×4 IMPLANT
DURAPREP 26ML APPLICATOR (WOUND CARE) ×2 IMPLANT
ELECT REM PT RETURN 15FT ADLT (MISCELLANEOUS) ×2 IMPLANT
FACESHIELD WRAPAROUND (MASK) ×2 IMPLANT
FACESHIELD WRAPAROUND OR TEAM (MASK) ×1 IMPLANT
GAUZE SPONGE 4X4 12PLY STRL (GAUZE/BANDAGES/DRESSINGS) ×2 IMPLANT
GLOVE BIO SURGEON STRL SZ8 (GLOVE) ×2 IMPLANT
GLOVE BIOGEL PI IND STRL 8 (GLOVE) ×1 IMPLANT
GLOVE BIOGEL PI INDICATOR 8 (GLOVE) ×1
GLOVE INDICATOR 8.0 STRL GRN (GLOVE) ×2 IMPLANT
GLOVE SURG ORTHO 8.5 STRL (GLOVE) ×2 IMPLANT
KIT BASIN OR (CUSTOM PROCEDURE TRAY) ×2 IMPLANT
NDL MAYO CATGUT SZ4 TPR NDL (NEEDLE) ×1 IMPLANT
NEEDLE HYPO 22GX1.5 SAFETY (NEEDLE) ×2 IMPLANT
NEEDLE MAYO CATGUT SZ4 (NEEDLE) ×2 IMPLANT
NS IRRIG 1000ML POUR BTL (IV SOLUTION) ×2 IMPLANT
PACK TOTAL JOINT (CUSTOM PROCEDURE TRAY) ×2 IMPLANT
PAD ABD 7.5X8 STRL (GAUZE/BANDAGES/DRESSINGS) ×1 IMPLANT
PAD CAST 4YDX4 CTTN HI CHSV (CAST SUPPLIES) ×2 IMPLANT
PADDING CAST COTTON 4X4 STRL (CAST SUPPLIES) ×2
PADDING CAST COTTON 6X4 STRL (CAST SUPPLIES) ×2 IMPLANT
PLATE ACE 100DE 10H (Plate) ×1 IMPLANT
POSITIONER SURGICAL ARM (MISCELLANEOUS) ×1 IMPLANT
SCREW CORTICAL 3.5MM  16MM (Screw) ×2 IMPLANT
SCREW CORTICAL 3.5MM 14MM (Screw) ×6 IMPLANT
SCREW CORTICAL 3.5MM 16MM (Screw) IMPLANT
SPLINT FIBERGLASS 5X30 (CAST SUPPLIES) ×4 IMPLANT
SPLINT PLASTER CAST XFAST 5X30 (CAST SUPPLIES) IMPLANT
SPLINT PLASTER XFAST SET 5X30 (CAST SUPPLIES) ×1
SPONGE LAP 18X18 RF (DISPOSABLE) ×2 IMPLANT
SPONGE LAP 4X18 RFD (DISPOSABLE) ×4 IMPLANT
STAPLER VISISTAT 35W (STAPLE) ×1 IMPLANT
STRIP CLOSURE SKIN 1/2X4 (GAUZE/BANDAGES/DRESSINGS) ×2 IMPLANT
SUCTION FRAZIER HANDLE 10FR (MISCELLANEOUS) ×1
SUCTION TUBE FRAZIER 10FR DISP (MISCELLANEOUS) ×1 IMPLANT
SUT ETHILON 2 0 PS N (SUTURE) ×1 IMPLANT
SUT MNCRL AB 4-0 PS2 18 (SUTURE) ×4 IMPLANT
SUT PROLENE 3 0 PS 2 (SUTURE) ×2 IMPLANT
SUT VIC AB 0 CT1 36 (SUTURE) ×1 IMPLANT
SUT VIC AB 2-0 CT1 27 (SUTURE) ×4
SUT VIC AB 2-0 CT1 TAPERPNT 27 (SUTURE) ×1 IMPLANT
SUT VIC AB 3-0 PS2 18 (SUTURE) ×2
SUT VIC AB 3-0 PS2 18XBRD (SUTURE) ×1 IMPLANT
SYR CONTROL 10ML LL (SYRINGE) ×2 IMPLANT
WATER STERILE IRR 1000ML POUR (IV SOLUTION) ×2 IMPLANT

## 2018-06-02 NOTE — Progress Notes (Signed)
PROGRESS NOTE    [name]  MEQ:[id number] DOB: May 30, 1961 DOA: 06/01/2018 PCP: Aletha Halim., PA-C  Brief Narrative:  [name] is a 57 y.o. 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 She fell to the floor but did not past out. She instantly felt pain in her left ankle and crawled to her desk where she called for help.  She had no focal neurological issues other than blurred vision. She has a h/o of BPPV but this did not feel like an episode of vertigo. She did not have chest pain palpitation, diaphoresis, nausea or vomiting.   In the ED she was found to be fluctuating from sinus brady 40s to A-flutter with RVR. She had a left ankle fracture. Cardiology and ortho have been called. She has received pain medication & oral Cardizem. Patient had Sinus Pauses so Cardizem and Amiodarone was discontinued. Patient to undergo Left Ankle Surgical Pair.  Assessment & Plan:   Principal Problem:   Near syncope Active Problems:   Hereditary and idiopathic peripheral neuropathy   Alcoholic cirrhosis of liver (HCC)   Splenomegaly   Thrombocytopenia (HCC)   B12 deficiency   Chronic pain   Closed left ankle fracture   PAF (paroxysmal atrial fibrillation) (HCC)   Benign essential HTN   Atrial flutter (HCC)   Bradycardia  Near Syncope - Likely Related to tachy/brady episodes - ? If she was orthostatic as she is taking HCTZ - ? 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 Related to be rate related vs. Ischemia -Cardiology Following and appreciate further evaluation and recommendations -Continue to Monitor Volume Status Carefully  Left Ankle Fracture -Ortho has been called by ED- a splint has been ordered and placed -Pain medications ordered -DVT prophylaxis ordered -Patient to undergo Surgical Intervention this AM -Will need PT/OT to Evaluate and Treat   Left knee Osteoarthritis and Effusion -Cont home pain meds  Benign essential HTN - would hold HCTZ today- Cardizem being started but now held due to Sinus Pauses   Hypokalemia -Suspect this is related to HCTZ -K+ was 3.4 -She has been told to eat bananas daily by per PCP but she hates them -Replete    Peripheral neuropathy/ Chronic pain -She follows with pain management -Cont Oxycodone/ APAP, Lyrica, Ibuprofen, Zanaflex -Given Dilaudid 1 mg [id number]  Alcoholic cirrhosis of liver Splenomegaly Thrombocytopenia  -Continues to drink 1-2 x week -Patient's Platelet Count was 128  Hypothyroidism  -Patients TSH was 1.225 and Free T4 was 1.19  Constipation -C/w Senna 8.6 mg po BID, Linzess and Miralax PRN  HLD -C/w Rosuvastatin 10 mg po qHS  COPD - Breo-ellipta and Albuterol ordered  Nicotine abuse - Nicotine patch ordered- counseled and patient understands  GERD -C/w Famotidine  20 mg TID  DVT prophylaxis:  Code Status: FULL CODE Family Communication:  Disposition Plan:   Consultants:   Orthopedic Surgery  Cardiology   Procedures:  ECHOCARDIOGRAM ------------------------------------------------------------------- Study Conclusions  - Left ventricle: Mild diffuse hypo kinesis with  abnormal septal   motion likely from LBBB. The cavity size was mildly dilated. Wall   thickness was normal. Systolic function was mildly reduced. The   estimated ejection fraction was in the range of 45% to 50%. Left   ventricular diastolic function parameters were normal. - Mitral valve: Calcified annulus. Mildly thickened leaflets . - Left atrium: The atrium was moderately dilated. - Right ventricle: The cavity size was mildly dilated. - Right atrium: The atrium was moderately dilated. - Atrial septum: No defect or patent foramen ovale was identified.  SURGICAL PROCEDURE   Open treatment of left ankle bimal fracture with internal fixation 2.  Repair of deltoid ligament avulsion   Antimicrobials:  Anti-infectives (From admission, onward)   Start     Dose/Rate Route Frequency Ordered Stop   06/02/18 0600  ceFAZolin (ANCEF) IVPB 2g/100 mL premix     2 g 200 mL/hr over 30 Minutes Intravenous On call to O.R. 06/01/18 1720 06/02/18 1300     Subjective: And examined at bedside prior to her surgery and she states she is doing okay.  Wishes she could be doing better.  No chest pain, shortness breath, lightheadedness or dizziness however she felt weak and states that she almost passed out yesterday.  Did not realize that she had pauses overnight.  No other concerns or complaints at this time  Objective: Vitals:   06/02/18 0125 06/02/18 0400 06/02/18 0405 06/02/18 0640  BP: 110/67 (!) 107/54 131/62 (!) 112/56  Pulse: (!) 52 (!) 53 (!) 53 60  Resp: (!) 8 11 17 16   Temp:  98 F (36.7 C)    TempSrc:  Oral    SpO2: 93% 91% 93% 91%  Weight:      Height:        Intake/Output Summary (Last 24 hours) at 06/02/2018 0734 Last data filed at 06/02/2018 0500 Gross per 24 hour  Intake 339.87 ml  Output -  Net 339.87 ml   Filed Weights   06/01/18 0742 06/01/18 2040  Weight: 117.9 kg 124.5 kg   Examination: Physical Exam:  Constitutional: WN/WD obese Caucasian female in NAD and appears  calm and comfortable Eyes: Lids and conjunctivae normal, sclerae anicteric  ENMT: External Ears, Nose appear normal. Grossly normal hearing. Mucous membranes are moist.  Neck: Appears normal, supple, no cervical masses, normal ROM, no appreciable thyromegaly Respiratory: Diminished to auscultation bilaterally, no wheezing, rales, rhonchi or crackles. Normal respiratory effort and patient is not tachypenic. No accessory muscle use.  Cardiovascular: RRR but bradycardic, no murmurs / rubs / gallops.  No carotid bruits.  Abdomen: Soft, non-tender, Distended 2/2 to Body habitus. No masses palpated. No appreciable hepatosplenomegaly. Bowel sounds positive x4.  GU: Deferred. Musculoskeletal: Left Leg Wrapped  Skin: No rashes, lesions, ulcers on a limited skin evaluation. No induration; Warm and dry.  Neurologic: CN 2-12 grossly intact with no focal deficits. Romberg sign cerebellar reflexes not assessed.  Psychiatric: Normal judgment and insight. Alert and oriented x 3. Normal mood and appropriate affect.   Data Reviewed: I have personally reviewed following labs and imaging studies  CBC: Recent Labs  Lab 06/01/18 0859 06/02/18 0325  WBC 7.3 5.7  NEUTROABS 4.8  --   HGB 14.7 13.3  HCT 44.6 41.6  MCV 100.0 100.7*  PLT 121* 696*   Basic Metabolic Panel: Recent Labs  Lab 06/01/18 0859 06/02/18 0325  NA 142 142  K 3.4* 3.4*  CL 104 104  CO2 27 28  GLUCOSE 129* 123*  BUN 15 17  CREATININE 0.92 0.68  CALCIUM 9.4 9.2  MG 2.1  --    GFR: Estimated Creatinine Clearance: 110.9 mL/min (by C-G formula based on SCr of 0.68 mg/dL). Liver Function Tests: Recent Labs  Lab 06/01/18 0859  AST 21  ALT 16  ALKPHOS 70  BILITOT 0.9  PROT 6.7  ALBUMIN 3.7   No results for input(s): LIPASE, AMYLASE in the last 168 hours. No results for input(s): AMMONIA in the last 168 hours. Coagulation Profile: No results for input(s): INR, PROTIME in the last 168 hours. Cardiac Enzymes: Recent Labs    Lab 06/01/18 0859  TROPONINI <0.03   BNP (last 3 results) No results for input(s): PROBNP in the last 8760 hours. HbA1C: No results for input(s): HGBA1C in the last 72 hours. CBG: No results for input(s): GLUCAP in the last 168 hours. Lipid Profile: Recent Labs    06/02/18 0325  CHOL 119  HDL 39*  LDLCALC 59  TRIG 104  CHOLHDL 3.1   Thyroid Function Tests: Recent Labs    06/02/18 0325  TSH 1.225   Anemia Panel: No results for input(s): VITAMINB12, FOLATE, FERRITIN, TIBC, IRON, RETICCTPCT in the last 72 hours. Sepsis Labs: No results for input(s): PROCALCITON, LATICACIDVEN in the last 168 hours.  Recent Results (from the past 240 hour(s))  Surgical PCR screen     Status: None   Collection Time: 06/01/18  5:41 PM  Result Value Ref Range Status   MRSA, PCR NEGATIVE NEGATIVE Final   Staphylococcus aureus NEGATIVE NEGATIVE Final    Comment: (NOTE) The Xpert SA Assay (FDA approved for NASAL specimens in patients 69 years of age and older), is one component of a comprehensive surveillance program. It is not intended to diagnose infection nor to guide or monitor treatment. Performed at [facility], [place] [address], [place], [place] [address]     Radiology Studies: Dg Ankle Complete Left  Result Date: 06/01/2018 CLINICAL DATA:  57 year old female status post fall at work with severe ankle pain. EXAM: LEFT ANKLE COMPLETE - 3+ VIEW COMPARISON:  Left ankle series 07/25/2006. FINDINGS: Oblique comminuted fracture of the distal left fibula metadiaphysis with lateral angulation and mild lateral displacement. Associated lateral subluxation of the mortise joint with combined acute and chronic medial malleolus bone fragments. Associated abnormal widening of the tibiofibular syndesmosis. No other distal tibia fracture. Mild irregularity of the talar dome appears somewhat non acute. Calcaneus appears stable and intact. There is fragmentation of the navicular bone  which is new since 2007 and appears to be associated with new but chronic TMT joint degeneration. IMPRESSION: 1. Acute comminuted fracture of the distal left fibula metadiaphysis with mild lateral displacement and angulation. 2. Associated injury to the tibia-fibular syndesmosis, small avulsions at the medial malleolus, and lateral subluxation of the mortise joint. 3. Age indeterminate but probably chronic fragmentation of the navicular, in association with other chronic distal tarsal bone degeneration. Electronically Signed   By: Genevie Ann M.D.   On: 06/01/2018 08:52   Ct Head Wo Contrast  Result Date: 06/01/2018 CLINICAL DATA:  Focal neuro deficit, greater than 6 hours stroke suspected. Altered level of consciousness. Fall today with possible loss consciousness. EXAM: CT HEAD WITHOUT CONTRAST TECHNIQUE: Contiguous axial images were  obtained from the base of the skull through the vertex without intravenous contrast. COMPARISON:  None. FINDINGS: Brain: No acute infarct, hemorrhage, or mass lesion is present. The ventricles are of normal size. No significant extraaxial fluid collection is present. No significant white matter disease is present. Brainstem and cerebellum are normal. Vascular: No hyperdense vessel or unexpected calcification. Skull: Calvarium is intact. No focal lytic or blastic lesions are present. No significant extracranial soft tissue injury is present. Sinuses/Orbits: The paranasal sinuses and mastoid air cells are clear. Globes and orbits are within normal limits. IMPRESSION: Negative CT of the head. Electronically Signed   By: San Morelle M.D.   On: 06/01/2018 12:29   Dg Knee Complete 4 Views Left  Result Date: 06/01/2018 CLINICAL DATA:  [place] at work today.  Leg pain. EXAM: LEFT KNEE - COMPLETE 4+ VIEW COMPARISON:  None. FINDINGS: Osteoarthritis, most pronounced in the medial compartment and patellofemoral joint. Several intra articular loose bodies. Moderate joint effusion. No  evidence of fracture. IMPRESSION: Advanced chronic osteoarthritis. Loose bodies. Joint effusion. No acute fracture. Electronically Signed   By: Nelson Chimes M.D.   On: 06/01/2018 09:46   Scheduled Meds: . famotidine  20 mg Oral TID  . fluticasone  2 spray Each Nare Daily  . fluticasone furoate-vilanterol  1 puff Inhalation Daily  . folic acid  1 mg Oral Daily  . ibuprofen  800 mg Oral TID  . levothyroxine  25 mcg Oral QAC breakfast  . mupirocin ointment  1 application Nasal BID  . nicotine  14 mg Transdermal Daily  . povidone-iodine  2 application Topical Once  . pregabalin  150 mg Oral TID  . rosuvastatin  10 mg Oral QHS  . tiZANidine  4 mg Oral QHS   Continuous Infusions: .  ceFAZolin (ANCEF) IV    . lactated ringers 50 mL/hr at 06/02/18 0500    LOS: 1 day   Kerney Elbe, DO [facility] PAGER is on [place]  If 7PM-7AM, please contact night-coverage [email or web address] Password [facility] 06/02/2018, 7:34 AM

## 2018-06-02 NOTE — Anesthesia Postprocedure Evaluation (Signed)
Anesthesia Post Note  Patient: [name]  Procedure(s) Performed: OPEN REDUCTION INTERNAL FIXATION (ORIF) ANKLE FRACTURE (Left Ankle)     Patient location during evaluation: PACU Anesthesia Type: General and Regional Level of consciousness: awake and alert Pain management: pain level controlled Vital Signs Assessment: post-procedure vital signs reviewed and stable Respiratory status: spontaneous breathing, nonlabored ventilation, respiratory function stable and patient connected to nasal cannula oxygen Cardiovascular status: blood pressure returned to baseline and stable Postop Assessment: no apparent nausea or vomiting Anesthetic complications: no    Last Vitals:  Vitals:   06/02/18 1430 06/02/18 1445  BP: 109/65 (!) 146/63  Pulse: 68 69  Resp: 19 16  Temp:  37.2 C  SpO2: 95% 98%    Last Pain:  Vitals:   06/02/18 1430  TempSrc:   PainSc: 0-No pain                 Effie Berkshire

## 2018-06-02 NOTE — Anesthesia Procedure Notes (Signed)
Anesthesia Regional Block: Adductor canal block   Pre-Anesthetic Checklist: ,, timeout performed, Correct Patient, Correct Site, Correct Laterality, Correct Procedure, Correct Position, site marked, Risks and benefits discussed,  Surgical consent,  Pre-op evaluation,  At surgeon's request and post-op pain management  Laterality: Left  Prep: chloraprep       Needles:  Injection technique: Single-shot  Needle Type: Echogenic Needle     Needle Length: 9cm  Needle Gauge: 21     Additional Needles:   Procedures:,,,, ultrasound used (permanent image in chart),,,,  Narrative:  Start time: 06/02/2018 12:35 PM End time: 06/02/2018 12:40 PM Injection made incrementally with aspirations every 5 mL.  Events: blood aspirated,,,,,,,,,,  Performed by: Personally  Anesthesiologist: Effie Berkshire, MD  Additional Notes: Patient tolerated the procedure well. Local anesthetic introduced in an incremental fashion under minimal resistance after negative aspirations. No paresthesias were elicited. After completion of the procedure, no acute issues were identified and patient continued to be monitored by RN.

## 2018-06-02 NOTE — Transfer of Care (Signed)
Immediate Anesthesia Transfer of Care Note  Patient: [name]  Procedure(s) Performed: Procedure(s): OPEN REDUCTION INTERNAL FIXATION (ORIF) ANKLE FRACTURE (Left)  Patient Location: PACU  Anesthesia Type:GA combined with regional for post-op pain  Level of Consciousness:  sedated, patient cooperative and responds to stimulation  Airway & Oxygen Therapy:Patient Spontanous Breathing and Patient connected to face mask oxgen  Post-op Assessment:  Report given to PACU RN and Post -op Vital signs reviewed and stable  Post vital signs:  Reviewed and stable  Last Vitals:  Vitals:   06/02/18 1238 06/02/18 1239  BP: (!) 155/73   Pulse: 64 72  Resp: (!) 22 19  Temp:    SpO2: 04% 79%    Complications: No apparent anesthesia complications

## 2018-06-02 NOTE — Progress Notes (Signed)
Assisted Dr. Hollis with left, ultrasound guided, popliteal and adductor canal block. Side rails up, monitors on throughout procedure. See vital signs in flow sheet. Tolerated Procedure well. 

## 2018-06-02 NOTE — Anesthesia Procedure Notes (Signed)
Anesthesia Regional Block: Popliteal block   Pre-Anesthetic Checklist: ,, timeout performed, Correct Patient, Correct Site, Correct Laterality, Correct Procedure, Correct Position, site marked, Risks and benefits discussed,  Surgical consent,  Pre-op evaluation,  At surgeon's request and post-op pain management  Laterality: Left  Prep: chloraprep       Needles:  Injection technique: Single-shot  Needle Type: Echogenic Needle     Needle Length: 9cm  Needle Gauge: 21     Additional Needles:   Procedures:,,,, ultrasound used (permanent image in chart),,,,  Narrative:  Start time: 06/02/2018 12:25 PM End time: 06/02/2018 12:35 PM Injection made incrementally with aspirations every 5 mL.  Performed by: Personally  Anesthesiologist: Effie Berkshire, MD  Additional Notes: Patient uncomfortable throughout procedure due needle insertion. Local anesthetic introduced in an incremental fashion under minimal resistance after negative aspirations. No paresthesias were elicited. After completion of the procedure, no acute issues were identified and patient continued to be monitored by RN.

## 2018-06-02 NOTE — Progress Notes (Signed)
Patient had 12 beat run Vtach. Patient had no c/o chest pain or shortness of breath. Paged K. Schorr. Continue to monitor.

## 2018-06-02 NOTE — Progress Notes (Signed)
OT Cancellation Note  Patient Details Name: [name] MRN: [id number] DOB: Feb 25, 1961   Cancelled Treatment:    Reason Eval/Treat Not Completed: Other (comment). Pt had sx today. Will check back tomorrow.  Kaida Games 06/02/2018, 2:34 PM  Lesle Chris, OTR/L [phone] 06/02/2018

## 2018-06-02 NOTE — Care Management Note (Signed)
Case Management Note  Patient Details  Name: [name] MRN: [id number] Date of Birth: 08/26/1961  Subjective/Objective:                  57 year old female with a past medical history of smoking and peripheral neuropathy.  She was at work this morning when she had a syncopal episode and fell injuring her left ankle.  She was seen in my office in late July for a right foot problem.  At that time there were no left lower extremity issues.  She complains of moderate pain in the left ankle today.  Her pain is better now that she is splinted.  Motion at that left lower extremity causes the ankle to hurt more.  She is not diabetic.  She smokes 4 cigarettes a day.  She has not had any surgery on the left ankle past.  She is being treated by the cardiology team for her syncopal episode. Cleared for surgery by [id number] Action/Plan: Following for post plan and needs.  Expected Discharge Date:  (unknown)               Expected Discharge Plan:     In-House Referral:     Discharge planning Services     Post Acute Care Choice:    Choice offered to:     DME Arranged:    DME Agency:     HH Arranged:    HH Agency:     Status of Service:     If discussed at [employer], dates discussed:    Additional Comments:  Leeroy Cha, RN 06/02/2018, 10:01 AM

## 2018-06-02 NOTE — Progress Notes (Addendum)
Progress Note  Patient Name: [name] Date of Encounter: 06/02/2018  Primary Cardiologist: Jenkins Rouge, MD   Subjective   She is unaware of her 2 sec pauses and brief conversion to flutter with spontaneous return to sinus bradycardia. She is ready for surgery today.  Inpatient Medications    Scheduled Meds: . famotidine  20 mg Oral TID  . fluticasone  2 spray Each Nare Daily  . fluticasone furoate-vilanterol  1 puff Inhalation Daily  . folic acid  1 mg Oral Daily  . ibuprofen  800 mg Oral TID  . levothyroxine  25 mcg Oral QAC breakfast  . mupirocin ointment  1 application Nasal BID  . nicotine  14 mg Transdermal Daily  . povidone-iodine  2 application Topical Once  . pregabalin  150 mg Oral TID  . rosuvastatin  10 mg Oral QHS  . tiZANidine  4 mg Oral QHS   Continuous Infusions: .  ceFAZolin (ANCEF) IV    . lactated ringers 50 mL/hr at 06/02/18 0500   PRN Meds: albuterol, HYDROmorphone (DILAUDID) injection, linaclotide, olopatadine, oxyCODONE   Vital Signs    Vitals:   06/02/18 0125 06/02/18 0400 06/02/18 0405 06/02/18 0640  BP: 110/67 (!) 107/54 131/62 (!) 112/56  Pulse: (!) 52 (!) 53 (!) 53 60  Resp: (!) 8 11 17 16   Temp:  98 F (36.7 C)    TempSrc:  Oral    SpO2: 93% 91% 93% 91%  Weight:      Height:        Intake/Output Summary (Last 24 hours) at 06/02/2018 0720 Last data filed at 06/02/2018 0500 Gross per 24 hour  Intake 339.87 ml  Output -  Net 339.87 ml   Filed Weights   06/01/18 0742 06/01/18 2040  Weight: 117.9 kg 124.5 kg    Telemetry    6 beat run of NSVT, 2 sec pauses startin gat 0509, atrial flutter on my exam with return to sinus brady spontaneously - Personally Reviewed  ECG    No new tracings - Personally Reviewed  Physical Exam   GEN: No acute distress.   Neck: No JVD Cardiac: irregular rhythm, regular rate, no murmur Respiratory: Clear to auscultation bilaterally. GI: Soft, nontender, non-distended  MS: No edema;  left leg and ankle wrapped Neuro:  Nonfocal  Psych: Normal affect   Labs    Chemistry Recent Labs  Lab 06/01/18 0859 06/02/18 0325  NA 142 142  K 3.4* 3.4*  CL 104 104  CO2 27 28  GLUCOSE 129* 123*  BUN 15 17  CREATININE 0.92 0.68  CALCIUM 9.4 9.2  PROT 6.7  --   ALBUMIN 3.7  --   AST 21  --   ALT 16  --   ALKPHOS 70  --   BILITOT 0.9  --   GFRNONAA >60 >60  GFRAA >60 >60  ANIONGAP 11 10     Hematology Recent Labs  Lab 06/01/18 0859 06/02/18 0325  WBC 7.3 5.7  RBC 4.46 4.13  HGB 14.7 13.3  HCT 44.6 41.6  MCV 100.0 100.7*  MCH 33.0 32.2  MCHC 33.0 32.0  RDW 14.1 14.4  PLT 121* 128*    Cardiac Enzymes Recent Labs  Lab 06/01/18 0859  TROPONINI <0.03   No results for input(s): TROPIPOC in the last 168 hours.   BNPNo results for input(s): BNP, PROBNP in the last 168 hours.   DDimer No results for input(s): DDIMER in the last 168 hours.   Radiology  Dg Ankle Complete Left  Result Date: 06/01/2018 CLINICAL DATA:  57 year old female status post fall at work with severe ankle pain. EXAM: LEFT ANKLE COMPLETE - 3+ VIEW COMPARISON:  Left ankle series 07/25/2006. FINDINGS: Oblique comminuted fracture of the distal left fibula metadiaphysis with lateral angulation and mild lateral displacement. Associated lateral subluxation of the mortise joint with combined acute and chronic medial malleolus bone fragments. Associated abnormal widening of the tibiofibular syndesmosis. No other distal tibia fracture. Mild irregularity of the talar dome appears somewhat non acute. Calcaneus appears stable and intact. There is fragmentation of the navicular bone which is new since 2007 and appears to be associated with new but chronic TMT joint degeneration. IMPRESSION: 1. Acute comminuted fracture of the distal left fibula metadiaphysis with mild lateral displacement and angulation. 2. Associated injury to the tibia-fibular syndesmosis, small avulsions at the medial malleolus, and  lateral subluxation of the mortise joint. 3. Age indeterminate but probably chronic fragmentation of the navicular, in association with other chronic distal tarsal bone degeneration. Electronically Signed   By: Genevie Ann M.D.   On: 06/01/2018 08:52   Ct Head Wo Contrast  Result Date: 06/01/2018 CLINICAL DATA:  Focal neuro deficit, greater than 6 hours stroke suspected. Altered level of consciousness. Fall today with possible loss consciousness. EXAM: CT HEAD WITHOUT CONTRAST TECHNIQUE: Contiguous axial images were obtained from the base of the skull through the vertex without intravenous contrast. COMPARISON:  None. FINDINGS: Brain: No acute infarct, hemorrhage, or mass lesion is present. The ventricles are of normal size. No significant extraaxial fluid collection is present. No significant white matter disease is present. Brainstem and cerebellum are normal. Vascular: No hyperdense vessel or unexpected calcification. Skull: Calvarium is intact. No focal lytic or blastic lesions are present. No significant extracranial soft tissue injury is present. Sinuses/Orbits: The paranasal sinuses and mastoid air cells are clear. Globes and orbits are within normal limits. IMPRESSION: Negative CT of the head. Electronically Signed   By: San Morelle M.D.   On: 06/01/2018 12:29   Dg Knee Complete 4 Views Left  Result Date: 06/01/2018 CLINICAL DATA:  [place] at work today.  Leg pain. EXAM: LEFT KNEE - COMPLETE 4+ VIEW COMPARISON:  None. FINDINGS: Osteoarthritis, most pronounced in the medial compartment and patellofemoral joint. Several intra articular loose bodies. Moderate joint effusion. No evidence of fracture. IMPRESSION: Advanced chronic osteoarthritis. Loose bodies. Joint effusion. No acute fracture. Electronically Signed   By: Nelson Chimes M.D.   On: 06/01/2018 09:46    Cardiac Studies   Echo 06/01/18: Study Conclusions - Left ventricle: Mild diffuse hypo kinesis with abnormal septal   motion likely  from LBBB. The cavity size was mildly dilated. Wall   thickness was normal. Systolic function was mildly reduced. The   estimated ejection fraction was in the range of 45% to 50%. Left   ventricular diastolic function parameters were normal. - Mitral valve: Calcified annulus. Mildly thickened leaflets . - Left atrium: The atrium was moderately dilated. - Right ventricle: The cavity size was mildly dilated. - Right atrium: The atrium was moderately dilated. - Atrial septum: No defect or patent foramen ovale was identified.  Patient Profile     57 y.o. female with a hx of ongoing tobacco abuse, former alcohol abuse, cirrhosis (pt states prior EGD negative for varices), B12 deficiency, peripheral neuropathy, BPPV, aortic atherosclerosis/coronary atherosclerosis by CT 05/2018 along with COPD, hypothyroidism, HLD, spinal stenosis with spinal stimulator who is being seen for the evaluation of bradycardia and  irregular rhythm.   Assessment & Plan    1. Pauses on telemetry, PAF, tachy-brady syndrome, new LBBB - was on amiodarone for AAT - was called last evening with pauses lasting 3.2-4.0 seconds cardizem had been stopped, amiodarone was D/C'ed last evening - telemetry review today with 2 second pauses, she is asymptomatic - also appears to have bouts of atrial flutter, rate controlled in the 80-90s, with spontaneous conversion back to sinus brady in the 50s - discussed possible tachy-brady and PPM insertion - This patients CHA2DS2-VASc Score and unadjusted Ischemic Stroke Rate (% per year) is equal to 2.2 % stroke rate/year from a score of 2 (CHF, female) - she will likely need PPM for treatment of tachy-brady - ok from a cardiac standpoint to complete surgery today - attending to see   2. New onset systolic heart failure - echo with EF of 45-50%, mildly reduced - may be the result of tachy-brady vs ischemia - will consider heart cath with new left bundle and reduced EF   3. Acute on  chronic dizziness with resultant left ankle fracture and knee effusion - plan for surgery today   4. Cirrhosis, mild thrombocytopenia - 128 today   5. Coronary atherosclerosis by CT 05/2018 - 06/02/2018: Cholesterol 119; HDL 39; LDL Cholesterol 59; Triglycerides 104; VLDL 21 - continue statin      For questions or updates, please contact [place] Please consult [email or web address] for contact info under Cardiology/STEMI.      Signed, [place], [place]  06/02/2018, 7:20 AM    Patient examined chart reviewed. Currently NSR rate 60's with LBBB on telemetry No cardiac symptoms Fractured left ankle normal heart sounds lungs clear. Continue iv amiodarone intra operatively to hopefully keep from having PaF with pauses. Suspect she will need DOAC and amiodarone post op with 30 day event monitor with EP f/u   Jenkins Rouge

## 2018-06-02 NOTE — Op Note (Signed)
06/02/2018  2:01 PM  PATIENT:  [name]  57 y.o. female  PRE-OPERATIVE DIAGNOSIS:  Left ankle bimalleolar fracture  POST-OPERATIVE DIAGNOSIS: 1.  Left ankle bimalleolar fracture      2.  Left deltoid ligament avulsion  Procedure(s): 1.  Open treatment of left ankle bimal fracture with internal fixation 2.  Repair of deltoid ligament avulsion 3.  Stress exam of left ankle under fluoro 4.  Ap, mortise and lateral xrays of the left ankle  SURGEON:  Wylene Simmer, MD  ASSISTANT: n/a  ANESTHESIA:   General, regional  EBL:  minimal   TOURNIQUET:   Total Tourniquet Time Documented: Thigh (Left) - 45 minutes Total: Thigh (Left) - 45 minutes  COMPLICATIONS:  None apparent  DISPOSITION:  Extubated, awake and stable to recovery.  INDICATION FOR PROCEDURE: The patient is a 57 year old female who injured her ankle at work yesterday.  She slipped and fell sustaining a bimalleolar fracture.  She was admitted for work-up of her syncopal episode.  She is cleared by cardiology for surgery today.  She presents now for operative treatment of this displaced and unstable left ankle fracture.  The risks and benefits of the alternative treatment options have been discussed in detail.  The patient wishes to proceed with surgery and specifically understands risks of bleeding, infection, nerve damage, blood clots, need for additional surgery, amputation and death.  PROCEDURE IN DETAIL:  After pre operative consent was obtained, and the correct operative site was identified, the patient was brought to the operating room and placed supine on the OR table.  Anesthesia was administered.  Pre-operative antibiotics were administered.  A surgical timeout was taken.  The left lower extremity was prepped and draped in standard sterile fashion with a tourniquet around the thigh.  The extremity was elevated and the tourniquet was inflated to 300 mmHg.  A longitudinal incision was made over the lateral malleolus.   Dissection was carried down through the subcutaneous tissues to the fracture site.  Fracture was cleaned of all hematoma and irrigated.  The fracture was reduced and held with a tenaculum.  A 10 hole one third tubular plate was contoured to fit the lateral malleolus.  It was secured distally with 3 unicortical screws.  The plate was then used as a reduction aid and clamped across the fracture site.  AP and lateral radiographs confirmed reduction of the fracture.  Fracture was then secured proximally with 3 bicortical screws.  2 lag screws were then placed through the plate and across the fracture site.  The more proximal and had good purchase.  The more distal one intersected with a butterfly fragment and had adequate purchase.  Attention was then turned to the medial malleolus.  An incision was made and dissection was carried down through the subtenons tissues.  The avulsion fracture was identified.  There was inadequate bone to allow screw fixation.  A drill hole was made and a 1.45 mm Biomet juggerknot anchor was inserted.  It was noted to have excellent purchase.  Suture was then passed down through limbs of the deep deltoid ligament.  The suture was tied pulling the deep deltoid fibers against the avulsed area of bone.  The limbs of suture were then passed through the superficial deltoid fibers and then up through the remaining fibers on the tibia.  This pulled the superficial deltoid fibers up onto the appropriate location on the bone and held in securely.  0 Vicryl figure-of-eight sutures were then used to further repair  the deltoid ligament rupture.  AP, lateral and mortise radiographs confirmed appropriate reduction of the fracture in appropriate position and length of all hardware.  Stress examination was then performed with dorsiflexion and external rotation stress.  There was no widening of the ankle mortise or medial clear space.  The wound was irrigated copiously.  Deep subcutaneous tissues were  approximated with 2-0 Vicryl.  The medial skin incision was closed with 3-0 nylon.  Lateral skin incision was closed with staples.  Sterile dressings were applied followed by a well-padded short leg splint.  The tourniquet was released after application of the dressings.  Patient was awakened from anesthesia and transported to the recovery room in stable condition.   FOLLOW UP PLAN: Nonweightbearing on the left lower extremity.  Lovenox for DVT prophylaxis.  Follow-up in the office in 2 weeks for suture removal and conversion to a short leg cast.  Plan nonweightbearing for 6 weeks postop.  RADIOGRAPHS: AP, mortise and lateral radio graphs of the left ankle are obtained intraoperatively.  These show interval reduction and fixation of the medial and lateral malleolus fractures.  Hardware is appropriately positioned and of the appropriate lengths.

## 2018-06-02 NOTE — Anesthesia Preprocedure Evaluation (Signed)
Anesthesia Evaluation  Patient identified by MRN, date of birth, ID band Patient awake    Reviewed: Allergy & Precautions, NPO status , Patient's Chart, lab work & pertinent test results  Airway Mallampati: I  TM Distance: >3 FB Neck ROM: Full    Dental  (+) Teeth Intact, Dental Advisory Given   Pulmonary Current Smoker,    breath sounds clear to auscultation       Cardiovascular hypertension, Pt. on medications  Rhythm:Regular Rate:Normal     Neuro/Psych PSYCHIATRIC DISORDERS  Neuromuscular disease    GI/Hepatic Neg liver ROS, GERD  Medicated,  Endo/Other  Hypothyroidism   Renal/GU negative Renal ROS     Musculoskeletal  (+) Arthritis , Osteoarthritis,    Abdominal Normal abdominal exam  (+)   Peds  Hematology   Anesthesia Other Findings   Reproductive/Obstetrics                             Anesthesia Physical Anesthesia Plan  ASA: III  Anesthesia Plan: General   Post-op Pain Management: GA combined w/ Regional for post-op pain   Induction: Intravenous  PONV Risk Score and Plan: 3 and Ondansetron, Dexamethasone and Midazolam  Airway Management Planned: LMA  Additional Equipment: None  Intra-op Plan:   Post-operative Plan: Extubation in OR  Informed Consent: I have reviewed the patients History and Physical, chart, labs and discussed the procedure including the risks, benefits and alternatives for the proposed anesthesia with the patient or authorized representative who has indicated his/her understanding and acceptance.   Dental advisory given  Plan Discussed with: CRNA  Anesthesia Plan Comments:         Anesthesia Quick Evaluation

## 2018-06-02 NOTE — Anesthesia Procedure Notes (Signed)
Procedure Name: LMA Insertion Date/Time: 06/02/2018 1:10 PM Performed by: Lavina Hamman, CRNA Pre-anesthesia Checklist: Patient identified, Emergency Drugs available, Suction available, Patient being monitored and Timeout performed Patient Re-evaluated:Patient Re-evaluated prior to induction Oxygen Delivery Method: Circle system utilized Preoxygenation: Pre-oxygenation with 100% oxygen Induction Type: IV induction Ventilation: Mask ventilation without difficulty LMA: LMA inserted LMA Size: 4.0 Tube size: 4.0 mm Number of attempts: 1 Placement Confirmation: positive ETCO2 and breath sounds checked- equal and bilateral Tube secured with: Tape Dental Injury: Teeth and Oropharynx as per pre-operative assessment

## 2018-06-03 ENCOUNTER — Encounter (HOSPITAL_COMMUNITY): Payer: Self-pay | Admitting: Orthopedic Surgery

## 2018-06-03 ENCOUNTER — Other Ambulatory Visit: Payer: Self-pay | Admitting: Cardiology

## 2018-06-03 DIAGNOSIS — I495 Sick sinus syndrome: Secondary | ICD-10-CM

## 2018-06-03 DIAGNOSIS — I483 Typical atrial flutter: Secondary | ICD-10-CM

## 2018-06-03 DIAGNOSIS — I480 Paroxysmal atrial fibrillation: Secondary | ICD-10-CM

## 2018-06-03 LAB — CBC WITH DIFFERENTIAL/PLATELET
BASOS ABS: 0 10*3/uL (ref 0.0–0.1)
BASOS PCT: 0 %
EOS ABS: 0 10*3/uL (ref 0.0–0.7)
EOS PCT: 0 %
HEMATOCRIT: 40.8 % (ref 36.0–46.0)
Hemoglobin: 13.4 g/dL (ref 12.0–15.0)
Lymphocytes Relative: 7 %
Lymphs Abs: 0.5 10*3/uL — ABNORMAL LOW (ref 0.7–4.0)
MCH: 32.8 pg (ref 26.0–34.0)
MCHC: 32.8 g/dL (ref 30.0–36.0)
MCV: 99.8 fL (ref 78.0–100.0)
MONO ABS: 0.2 10*3/uL (ref 0.1–1.0)
Monocytes Relative: 3 %
NEUTROS ABS: 5.8 10*3/uL (ref 1.7–7.7)
Neutrophils Relative %: 90 %
Platelets: 120 10*3/uL — ABNORMAL LOW (ref 150–400)
RBC: 4.09 MIL/uL (ref 3.87–5.11)
RDW: 13.7 % (ref 11.5–15.5)
WBC: 6.5 10*3/uL (ref 4.0–10.5)

## 2018-06-03 LAB — COMPREHENSIVE METABOLIC PANEL
ALK PHOS: 66 U/L (ref 38–126)
ALT: 16 U/L (ref 0–44)
AST: 21 U/L (ref 15–41)
Albumin: 3.4 g/dL — ABNORMAL LOW (ref 3.5–5.0)
Anion gap: 10 (ref 5–15)
BUN: 14 mg/dL (ref 6–20)
CALCIUM: 9.2 mg/dL (ref 8.9–10.3)
CO2: 25 mmol/L (ref 22–32)
CREATININE: 0.7 mg/dL (ref 0.44–1.00)
Chloride: 103 mmol/L (ref 98–111)
GFR calc Af Amer: >60 mL/min (ref >60)
GFR calc non Af Amer: >60 mL/min (ref >60)
Glucose, Bld: 269 mg/dL — ABNORMAL HIGH (ref 70–99)
Potassium: 4 mmol/L (ref 3.5–5.1)
Sodium: 138 mmol/L (ref 135–145)
Total Bilirubin: 0.6 mg/dL (ref 0.3–1.2)
Total Protein: 6.4 g/dL — ABNORMAL LOW (ref 6.5–8.1)

## 2018-06-03 LAB — MAGNESIUM: MAGNESIUM: 1.9 mg/dL (ref 1.7–2.4)

## 2018-06-03 LAB — PHOSPHORUS: Phosphorus: 1.6 mg/dL — ABNORMAL LOW (ref 2.5–4.6)

## 2018-06-03 MED ORDER — POLYETHYLENE GLYCOL 3350 17 G PO PACK: 17.0000 g | PACK | Freq: Every day | ORAL | 0 refills | Status: DC | PRN

## 2018-06-03 MED ORDER — K PHOS MONO-SOD PHOS DI & MONO 155-852-130 MG PO TABS
500.0000 mg | ORAL_TABLET | Freq: Two times a day (BID) | ORAL | Status: DC
  Administered 2018-06-03: 500 mg via ORAL
  Filled 2018-06-03 (×2): qty 2

## 2018-06-03 MED ORDER — SENNA 8.6 MG PO TABS: 2.0000 | ORAL_TABLET | Freq: Two times a day (BID) | ORAL | 0 refills | Status: DC

## 2018-06-03 MED ORDER — HYDROMORPHONE HCL 2 MG PO TABS: 2.0000 mg | ORAL_TABLET | Freq: Four times a day (QID) | ORAL | 0 refills | Status: AC | PRN

## 2018-06-03 MED ORDER — APIXABAN 5 MG PO TABS: 5.0000 mg | ORAL_TABLET | Freq: Two times a day (BID) | ORAL | Status: DC

## 2018-06-03 MED ORDER — NAPROXEN 250 MG PO TABS: 250.0000 mg | ORAL_TABLET | Freq: Two times a day (BID) | ORAL | 0 refills | Status: DC

## 2018-06-03 MED ORDER — APIXABAN 5 MG PO TABS: 5.0000 mg | ORAL_TABLET | Freq: Two times a day (BID) | ORAL | 0 refills | Status: DC

## 2018-06-03 MED ORDER — NICOTINE 14 MG/24HR TD PT24: 14.0000 mg | MEDICATED_PATCH | Freq: Every day | TRANSDERMAL | 0 refills | Status: DC

## 2018-06-03 MED ORDER — DOCUSATE SODIUM 100 MG PO CAPS: 100.0000 mg | ORAL_CAPSULE | Freq: Two times a day (BID) | ORAL | 0 refills | Status: DC

## 2018-06-03 NOTE — Discharge Instructions (Addendum)
Tracy Simmer, MD [place]  Please read the following information regarding your care after surgery.  Medications  You only need a prescription for the narcotic pain medicine (ex. oxycodone, Percocet, Norco).  All of the other medicines listed below are available over the counter. X acetominophen (Tylenol) 650 mg every 4-6 hours as you need for minor to moderate pain X Resume percocoet as prescribed for moderate pain.  Supplement with Dilaudid, as prescribed, as needed for severe pain  Narcotic pain medicine (ex. oxycodone, Percocet, Vicodin) will cause constipation.  To prevent this problem, take the following medicines while you are taking any pain medicine. X docusate sodium (Colace) 100 mg twice a day X senna (Senokot) 2 tablets twice a day  X To help prevent blood clots, take Eliquis as prescribed   Weight Bearing X Do not bear any weight on the operated leg or foot.  Cast / Splint / Dressing X Keep your splint, cast or dressing clean and dry.  Dont put anything (coat hanger, pencil, etc) down inside of it.  If it gets damp, use a hair dryer on the cool setting to dry it.  If it gets soaked, call the office to schedule an appointment for a cast change.    After your dressing, cast or splint is removed; you may shower, but do not soak or scrub the wound.  Allow the water to run over it, and then gently pat it dry.  Swelling It is normal for you to have swelling where you had surgery.  To reduce swelling and pain, keep your toes above your nose for at least 3 days after surgery.  It may be necessary to keep your foot or leg elevated for several weeks.  If it hurts, it should be elevated.  Follow Up Call my office at [phone] when you are discharged from the hospital or surgery center to schedule an appointment to be seen two weeks after surgery.  Call my office at [phone] if you develop a fever >101.5 F, nausea, vomiting, bleeding from the surgical site or  severe pain.    Information on my medicine - ELIQUIS (apixaban)  This medication education was reviewed with me or my healthcare representative as part of my discharge preparation.  The pharmacist that spoke with me during my hospital stay was:  Altha Harm  Why was Eliquis prescribed for you? Eliquis was prescribed for you to reduce the risk of a blood clot forming that can cause a stroke if you have a medical condition called atrial fibrillation (a type of irregular heartbeat).  What do You need to know about Eliquis ? Take your Eliquis TWICE DAILY - one tablet in the morning and one tablet in the evening with or without food. If you have difficulty swallowing the tablet whole please discuss with your pharmacist how to take the medication safely.  Take Eliquis exactly as prescribed by your doctor and DO NOT stop taking Eliquis without talking to the doctor who prescribed the medication.  Stopping may increase your risk of developing a stroke.  Refill your prescription before you run out.  After discharge, you should have regular check-up appointments with your healthcare provider that is prescribing your Eliquis.  In the future your dose may need to be changed if your kidney function or weight changes by a significant amount or as you get older.  What do you do if you miss a dose? If you miss a dose, take it as soon as you remember on the  same day and resume taking twice daily.  Do not take more than one dose of ELIQUIS at the same time to make up a missed dose.  Important Safety Information A possible side effect of Eliquis is bleeding. You should call your healthcare provider right away if you experience any of the following: ? Bleeding from an injury or your nose that does not stop. ? Unusual colored urine (red or dark brown) or unusual colored stools (red or black). ? Unusual bruising for unknown reasons. ? A serious fall or if you hit your head (even if there is no  bleeding).  Some medicines may interact with Eliquis and might increase your risk of bleeding or clotting while on Eliquis. To help avoid this, consult your healthcare provider or pharmacist prior to using any new prescription or non-prescription medications, including herbals, vitamins, non-steroidal anti-inflammatory drugs (NSAIDs) and supplements.  This website has more information on Eliquis (apixaban): http://www.eliquis.com/eliquis/home

## 2018-06-03 NOTE — Evaluation (Signed)
Physical Therapy Evaluation Patient Details Name: [name] MRN: [id number] DOB: [birth year] Today's Date: 06/03/2018   History of Present Illness  Pt was admitted for near syncope/fall resulting in L bimalleolar fx.  S/P ORIF.  PMH:  chronic back pain, neuropathy  Clinical Impression  The  Patient demonstrates unsteadiness and much effort for transfer to [facility] then to recliner. Dyspnea 3/4. HR 70.s. The patient reports numbness of the left foot. Concern at present that patient will be able to ambulate the distances required  To enter home and navigate inside home safely maintaining NWB on LLE. Marland Kitchen Recommend WC for patient . Pt admitted with above diagnosis. Pt currently with functional limitations due to the deficits listed below (see PT Problem List).  Pt will benefit from skilled PT to increase their independence and safety with mobility to allow discharge to the venue listed below.        Follow Up Recommendations Home health PT;Supervision for mobility/OOB;Supervision/Assistance - 24 hour    Equipment Recommendations  ;Rolling walker with 5" wheels;Wheelchair with left Elevating leg rest.3in1 (PT)    Recommendations for Other Services       Precautions / Restrictions Precautions Precautions: Fall Restrictions Weight Bearing Restrictions: Yes LLE Weight Bearing: Non weight bearing      Mobility  Bed Mobility Overal bed mobility: Needs Assistance             General bed mobility comments: min guard with use of bed rails. Pt sleeps in recliner at home  Transfers Overall transfer level: Needs assistance Equipment used: Rolling walker (2 wheeled) Transfers: Sit to/from [facility] Sit to Stand: Min assist;+2 safety/equipment Stand pivot transfers: Min assist;+2 safety/equipment       General transfer comment: assist to rise/steady. Cues for UE/LE placement and for NWB which she was able to maintain  Ambulation/Gait                Stairs             Wheelchair Mobility    Modified Rankin (Stroke Patients Only)       Balance                                             Pertinent Vitals/Pain Pain Assessment: Faces Pain Score: 0-No pain Pain Location: left foot is numb    Home Living Family/patient expects to be discharged to:: Private residence Living Arrangements: Spouse/significant other;Children Available Help at Discharge: Family Type of Home: House Home Access: Stairs to enter Entrance Stairs-Rails: Psychiatric nurse of Steps: 2 front; Home Layout: Multi-level;Able to live on main level with bedroom/bathroom Home Equipment: None      Prior Function Level of Independence: Independent         Comments: supervisor of school cafeteria     Hand Dominance        Extremity/Trunk Assessment   Upper Extremity Assessment Upper Extremity Assessment: Defer to OT evaluation    Lower Extremity Assessment Lower Extremity Assessment: LLE deficits/detail LLE Deficits / Details: no noted toe movement, numbness of foot, in [place]    Cervical / Trunk Assessment Cervical / Trunk Assessment: Normal  Communication   Communication: No difficulties  Cognition Arousal/Alertness: Awake/alert Behavior During Therapy: WFL for tasks assessed/performed Overall Cognitive Status: Within Functional Limits for tasks assessed  General Comments      Exercises     Assessment/Plan    PT Assessment Patient needs continued PT services  PT Problem List Decreased strength;Impaired sensation       PT Treatment Interventions DME instruction;Gait training;Stair training;Functional mobility training;Therapeutic activities;Therapeutic exercise;Wheelchair mobility training;Patient/family education    PT Goals (Current goals can be found in the Care Plan section)  Acute Rehab PT Goals Patient Stated Goal: home today; return to  independence PT Goal Formulation: With patient Time For Goal Achievement: 06/10/18 Potential to Achieve Goals: Good    Frequency Min 6X/week   Barriers to discharge        Co-evaluation PT/OT/SLP Co-Evaluation/Treatment: Yes Reason for Co-Treatment: For patient/therapist safety PT goals addressed during session: Mobility/safety with mobility OT goals addressed during session: ADL's and self-care       AM-PAC PT "6 Clicks" Daily Activity  Outcome Measure Difficulty turning over in bed (including adjusting bedclothes, sheets and blankets)?: A Little Difficulty moving from lying on back to sitting on the side of the bed? : A Little Difficulty sitting down on and standing up from a chair with arms (e.g., wheelchair, bedside commode, etc,.)?: Unable Help needed moving to and from a bed to chair (including a wheelchair)?: Total Help needed walking in hospital room?: Total Help needed climbing 3-5 steps with a railing? : Total 6 Click Score: 10    End of Session Equipment Utilized During Treatment: Gait belt Activity Tolerance: Patient tolerated treatment well Patient left: in chair;with call bell/phone within reach Nurse Communication: Mobility status PT Visit Diagnosis: Unsteadiness on feet (R26.81)    Time: 9604-5409 PT Time Calculation (min) (ACUTE ONLY): 36 min   Charges:   PT Evaluation $PT Eval Low Complexity: [place] PT [phone]   Claretha Cooper 06/03/2018, 10:05 AM

## 2018-06-03 NOTE — Care Management Note (Signed)
Case Management Note  Patient Details  Name: [name] MRN: [id number] Date of Birth: 08-25-1961  Subjective/Objective:          Progression from chart        Pt tolerated surgery well. She denies chest pain, SOB, and palpitations. She has not had a recurrence of dizziness. Sinus brady on telemetry. 1. Pauses, PAF, tachy-brady, new LBBB - telemetry with sinus bradycardia - no amiodarone ordered for now - question if her dizziness was Afib - discussed 30 day monitor when discharged - she is asymptomatic This patients CHA2DS2-VASc Score and unadjusted Ischemic Stroke Rate (% per year) is equal to 2.2 % stroke rate/year from a score of 2 (female, CHF) - not on anticoagulation yet, lovenox PPX - will start AC once cleared by surgery  2. New onset systolic heart failure - echo with eF of 45-50%  - possibly due to tachy-brady vs ischemia - question heart cath for new LBBB, atrial flutter, and reduced EF - will discuss with attending  3. Dizziness - question if this was fib/flutter - will order 30 day monitor at discharge  4. Coronary atherosclerosis by CT 05/2018 - 06/02/2018: Cholesterol 119; HDL 39; LDL Cholesterol 59; Triglycerides 104; VLDL 21 - continue statin Action/Plan: Will continue to follow for progression and cm needs-poss. hhc due to ankle surgery.  Expected Discharge Date:  (unknown)               Expected Discharge Plan:  Home/Self Care  In-House Referral:     Discharge planning Services     Post Acute Care Choice:    Choice offered to:     DME Arranged:    DME Agency:     HH Arranged:    HH Agency:     Status of Service:  In process, will continue to follow  If discussed at Long Length of Stay Meetings, dates discussed:    Additional Comments:  Leeroy Cha, RN 06/03/2018, 10:49 AM

## 2018-06-03 NOTE — Progress Notes (Signed)
Subjective: 1 Day Post-Op Procedure(s) (LRB): OPEN REDUCTION INTERNAL FIXATION (ORIF) ANKLE FRACTURE (Left)  Patient reports pain as mild.  Tolerating POs well.  Admits to flatus.  Denies fever, chills, N/V, CP, SOB.  Reports that she is ready to go home.  Hopeful to go home on eliquis for DVT prophylaxis  Objective:   VITALS:  Temp:  [98 F (36.7 C)-99.3 F (37.4 C)] 98.4 F (36.9 C) (08/29 1200) Pulse Rate:  [50-78] 62 (08/29 1200) Resp:  [12-29] 16 (08/29 1200) BP: (109-156)/(46-83) 137/61 (08/29 1200) SpO2:  [91 %-100 %] 97 % (08/29 1200)  General: WDWN patient in NAD. Psych:  Appropriate mood and affect. Neuro:  A&O x 3, Moving all extremities, sensation intact to light touch HEENT:  EOMs intact Chest:  Even non-labored respirations Skin:  SLS C/D/I, no rashes or lesions Extremities: warm/dry, no visible edema, erythema or echymosis.  No lymphadenopathy. Pulses: Popliteus 2+ MSK:  ROM: EHL/FHL intact, MMT: able to perform quad set    LABS Recent Labs    06/01/18 0859 06/02/18 0325 06/03/18 0316  HGB 14.7 13.3 13.4  WBC 7.3 5.7 6.5  PLT 121* 128* 120*   Recent Labs    06/02/18 0325 06/03/18 0316  NA 142 138  K 3.4* 4.0  CL 104 103  CO2 28 25  BUN 17 14  CREATININE 0.68 0.70  GLUCOSE 123* 269*   No results for input(s): LABPT, INR in the last 72 hours.   Assessment/Plan: 1 Day Post-Op Procedure(s) (LRB): OPEN REDUCTION INTERNAL FIXATION (ORIF) ANKLE FRACTURE (Left)  NWB L LE Up with therapy Ok to start Eliquis now.  Will allow Cards team to determine appropriate dose and D/C script for DVT prophylaxis. Ok to D/C from ortho standpoint. Pain script on chart. Plan for 2 week outpatient post-op visit.  Mechele Claude PA-C [facility] Office:  [phone]

## 2018-06-03 NOTE — Progress Notes (Signed)
Physical Therapy Treatment Patient Details Name: [name] MRN: [id number] DOB: 06-Apr-1961 Today's Date: 06/03/2018    History of Present Illness Pt was admitted for near syncope/fall resulting in L bimalleolar fx.  S/P ORIF.  PMH:  chronic back pain, neuropathy    PT Comments    The patient ambulated x 10' with much difficulty. R ankle appears to have Charcot deformity wihich is a concern for further injury. Patient reports that she  Was being prepped for surgery on right foot. Recommend use of WC mostly except for short distances in house.   Follow Up Recommendations  Home health PT;Supervision for mobility/OOB;Supervision/Assistance - 24 hour     Equipment Recommendations  Crutches;Rolling walker with 5" wheels;Wheelchair (measurements PT);3in1 (PT)(patient will have to purchase RW as  needs WC)    Recommendations for Other Services       Precautions / Restrictions Precautions Precautions: Fall Restrictions LLE Weight Bearing: Non weight bearing    Mobility  Bed Mobility   Bed Mobility: Supine to Sit;Sit to Supine     Supine to sit: Supervision Sit to supine: Supervision      Transfers   Equipment used: Rolling walker (2 wheeled) Transfers: Sit to/from Stand Sit to Stand: Min assist Stand pivot transfers: +2 safety/equipment       General transfer comment: assist to rise/steady. Cues for UE/LE placement and for NWB which she was able to maintain  Ambulation/Gait Ambulation/Gait assistance: Min assist Gait Distance (Feet): 10 Feet Assistive device: Rolling walker (2 wheeled) Gait Pattern/deviations: Step-to pattern     General Gait Details: frequent cues to ensure NWB on left foot, at times appeared to have a little weight. Patient stated that there was no weight.   Stairs             Wheelchair Mobility    Modified Rankin (Stroke Patients Only)       Balance Overall balance assessment: Needs assistance Sitting-balance support: Feet  supported;No upper extremity supported Sitting balance-Leahy Scale: Good     Standing balance support: During functional activity;Bilateral upper extremity supported Standing balance-Leahy Scale: Fair Standing balance comment: with RW                            Cognition Arousal/Alertness: Awake/alert                                            Exercises      General Comments        Pertinent Vitals/Pain Pain Location: neuropathy PTA, feels a little more in the foot Pain Descriptors / Indicators: Tender Pain Intervention(s): Monitored during session    Home Living                      Prior Function            PT Goals (current goals can now be found in the care plan section) Progress towards PT goals: Progressing toward goals    Frequency    Min 6X/week      PT Plan Current plan remains appropriate    Co-evaluation              AM-PAC PT "6 Clicks" Daily Activity  Outcome Measure  Difficulty turning over in bed (including adjusting bedclothes, sheets and blankets)?: A Little Difficulty moving from lying on  back to sitting on the side of the bed? : A Little Difficulty sitting down on and standing up from a chair with arms (e.g., wheelchair, bedside commode, etc,.)?: Unable Help needed moving to and from a bed to chair (including a wheelchair)?: Total Help needed walking in hospital room?: Total Help needed climbing 3-5 steps with a railing? : Total 6 Click Score: 10    End of Session Equipment Utilized During Treatment: Gait belt Activity Tolerance: Patient tolerated treatment well Patient left: in bed Nurse Communication: Mobility status PT Visit Diagnosis: Unsteadiness on feet (R26.81)     Time: 2094-7096 PT Time Calculation (min) (ACUTE ONLY): 38 min  Charges:  $Gait Training: 8-22 mins $Self Care/Home Management: [address] PT [phone]    [name] 06/03/2018, 5:16 PM

## 2018-06-03 NOTE — Progress Notes (Signed)
Physical TherapyNote- evaluation completed. Recommend that patient DC with WC and left elevating leg rest and 3-in 1  And RW for safety as well as HHPT.Tresa Endo PT [phone]  .

## 2018-06-03 NOTE — Progress Notes (Signed)
ANTICOAGULATION CONSULT NOTE - Initial Consult  Pharmacy Consult for Eliquis Indication: atrial fibrillation and VTE prophylaxis  Allergies  Allergen Reactions  . Vancomycin Hives  . Sulfamethoxazole     GI Upset    Patient Measurements: Height: 5\' 9"  (175.3 cm) Weight: 274 lb 7.6 oz (124.5 kg) IBW/kg (Calculated) : 66.2  Vital Signs: Temp: 98.4 F (36.9 C) (08/29 1200) Temp Source: Oral (08/29 1200) BP: 137/61 (08/29 1200) Pulse Rate: 62 (08/29 1200)  Labs: Recent Labs    06/01/18 0859 06/02/18 0325 06/03/18 0316  HGB 14.7 13.3 13.4  HCT 44.6 41.6 40.8  PLT 121* 128* 120*  CREATININE 0.92 0.68 0.70  TROPONINI <0.03  --   --     Estimated Creatinine Clearance: 110.9 mL/min (by C-G formula based on SCr of 0.7 mg/dL).   Medical History: Past Medical History:  Diagnosis Date  . Alcohol abuse    a. quit several years ago.  . Allergy   . Anemia    younger  . Arthritis    feet,knees  . B12 deficiency   . BPPV (benign paroxysmal positional vertigo)   . Cirrhosis ([place])   . Complication of anesthesia    "slow to wake"  . Constipation    on movantik- stools regular and soft on this  . Foot drop   . Hyperlipidemia   . Hypothyroidism   . Neuromuscular disorder (HCC)    idiopathic neuropathy  . Spinal stenosis   . Tobacco abuse     Medications:  Scheduled:  . docusate sodium  100 mg Oral BID  . enoxaparin (LOVENOX) injection  40 mg Subcutaneous Q24H  . famotidine  20 mg Oral TID  . fluticasone  2 spray Each Nare Daily  . fluticasone furoate-vilanterol  1 puff Inhalation Daily  . folic acid  1 mg Oral Daily  . levothyroxine  25 mcg Oral QAC breakfast  . mupirocin ointment  1 application Nasal BID  . naproxen  250 mg Oral BID WC  . nicotine  14 mg Transdermal Daily  . phosphorus  500 mg Oral BID  . pregabalin  150 mg Oral TID  . rosuvastatin  10 mg Oral QHS  . senna  1 tablet Oral BID  . tiZANidine  4 mg Oral QHS   Infusions:  . sodium chloride 75  mL/hr at 06/03/18 0436    Assessment: 23 yoF admitted on 8/27 for ankle fracture after becoming light headed and falling.  Noted to have Afib with RVR in ED.  Prior to surgery, she was started on Lovenox for prophylaxis only.  Now postop, Pharmacy has been consulted to begin apixaban (full dose) for Afib stroke prophylaxis and VTE prophylaxis post surgery.   Today, 06/03/2018: POD1 ORIF ankle fracture.  No bleeding or complications reported. CBC: Hgb stable at 13.4, Plt 120 (hx thrombocytopenia, alcoholic cirrhosis) SCr 0.7, CrCl > 100 ml/min.   Goal of Therapy:  Monitor platelets by anticoagulation protocol: Yes   Plan:  D/c Lovenox (last dose 8/29 at 0930) Eliquis 5mg  PO BID (first dose 8/29 at 2200) Monitor for signs and symptoms of bleeding. Patient education prior to discharge.   Gretta Arab PharmD, BCPS Pager [phone] 06/03/2018 12:53 PM

## 2018-06-03 NOTE — Progress Notes (Signed)
    Durable Medical Equipment  (From admission, onward)         Start     Ordered   06/03/18 1256  DME 3-in-1  Once     06/03/18 1258   06/03/18 1256  For home use only DME Walker rolling  [facility])  Once    Question:  Patient needs a walker to treat with the following condition  Answer:  Closed left ankle fracture, initial encounter   06/03/18 1258   06/03/18 1254  For home use only DME standard manual wheelchair with seat cushion  Once    Comments:  Patient suffers from Left Ankle Fracture which impairs their ability to perform daily activities like Ambulating in the home as she is NWB.  A Rolling [place] will not resolve  issue with performing activities of daily living. A wheelchair will allow patient to safely perform daily activities. Patient can safely propel the wheelchair in the home or has a caregiver who can provide assistance.  Accessories: elevating leg rests (ELRs) (LEFT one especially), wheel locks, extensions and anti-tippers.   06/03/18 Knox Lowry

## 2018-06-03 NOTE — Evaluation (Signed)
Occupational Therapy Evaluation Patient Details Name: [name] MRN: [id number] DOB: 06/25/1961 Today's Date: 06/03/2018    History of Present Illness Pt was admitted for near syncope/fall resulting in L bimalleolar fx.  S/P ORIF.  PMH:  chronic back pain, neuropathy   Clinical Impression   This 57 year old female was admitted for the above.  At baseline, she is independent with all adls, iadls and works FT.  She is now NWB due to the above injury and needs min +2 for mobility and up to max A for LB adls. Will follow in acute setting with min guard level goals for toilet transfers and to further educate on AE for adls.    Follow Up Recommendations  Supervision/Assistance - 24 hour    Equipment Recommendations  3 in 1 bedside commode    Recommendations for Other Services       Precautions / Restrictions Precautions Precautions: Fall Restrictions Weight Bearing Restrictions: Yes LLE Weight Bearing: Non weight bearing      Mobility Bed Mobility Overal bed mobility: Needs Assistance             General bed mobility comments: min guard with use of bed rails. Pt sleeps in recliner at home  Transfers Overall transfer level: Needs assistance Equipment used: Rolling walker (2 wheeled) Transfers: Sit to/from [facility] Sit to Stand: Min assist;+2 safety/equipment Stand pivot transfers: Min assist;+2 safety/equipment       General transfer comment: assist to rise/steady. Cues for UE/LE placement and for NWB which she was able to maintain    Balance                                           ADL either performed or assessed with clinical judgement   ADL Overall ADL's : Needs assistance/impaired Eating/Feeding: Independent   Grooming: Set up   Upper Body Bathing: Set up   Lower Body Bathing: Moderate assistance   Upper Body Dressing : Set up   Lower Body Dressing: Maximal assistance   Toilet Transfer: Minimal  assistance;Stand-pivot;+2 for safety/equipment;BSC;RW   Toileting- Clothing Manipulation and Hygiene: Moderate assistance;+2 for safety/equipment;Sit to/from stand         General ADL Comments: used BSC and transferred to chair.  Pt does not have feeling in foot;cues for NWB/position during SPT.  Would benefit from reacher for adls.  Pt had difficulty reaching to back to wipe.  Would benefit from toilet aide also     Vision         Perception     Praxis      Pertinent Vitals/Pain Pain Assessment: No/denies pain(still has numbness)     Hand Dominance     Extremity/Trunk Assessment Upper Extremity Assessment Upper Extremity Assessment: Overall WFL for tasks assessed           Communication Communication Communication: No difficulties   Cognition Arousal/Alertness: Awake/alert Behavior During Therapy: WFL for tasks assessed/performed Overall Cognitive Status: Within Functional Limits for tasks assessed                                     General Comments       Exercises     Shoulder Instructions      Home Living Family/patient expects to be discharged to:: Private residence Living Arrangements: Spouse/significant  other;Children Available Help at Discharge: Family Type of Home: House Home Access: Stairs to enter [employer] of Steps: 2 front; Entrance Stairs-Rails: Right;Left Home Layout: Multi-level;Able to live on main level with bedroom/bathroom     Bathroom Shower/Tub: Occupational psychologist: Handicapped height     Home Equipment: None          Prior Functioning/Environment Level of Independence: Independent        Comments: supervisor of school cafeteria        OT Problem List: Decreased strength;Decreased activity tolerance;Decreased knowledge of use of DME or AE      OT Treatment/Interventions: Self-care/ADL training;DME and/or AE instruction;Patient/family education;Therapeutic activities    OT  Goals(Current goals can be found in the care plan section) Acute Rehab OT Goals Patient Stated Goal: home today; return to independence OT Goal Formulation: With patient Time For Goal Achievement: 06/17/18 Potential to Achieve Goals: Good ADL Goals Pt Will Transfer to Toilet: with min guard assist;bedside commode;stand pivot transfer Additional ADL Goal #1: pt will verbalize use of toilet aide and demonstrate use of reacher for adls with set up  OT Frequency: Min 2X/week   Barriers to D/C:            Co-evaluation PT/OT/SLP Co-Evaluation/Treatment: Yes Reason for Co-Treatment: For patient/therapist safety PT goals addressed during session: Mobility/safety with mobility OT goals addressed during session: ADL's and self-care      AM-PAC PT "6 Clicks" Daily Activity     Outcome Measure Help from another person eating meals?: None Help from another person taking care of personal grooming?: A Little Help from another person toileting, which includes using toliet, bedpan, or urinal?: A Lot Help from another person bathing (including washing, rinsing, drying)?: A Lot Help from another person to put on and taking off regular upper body clothing?: A Little Help from another person to put on and taking off regular lower body clothing?: A Lot 6 Click Score: 16   End of Session    Activity Tolerance: Patient tolerated treatment well Patient left: in chair;with call bell/phone within reach;with chair alarm set  OT Visit Diagnosis: Muscle weakness (generalized) (M62.81)                Time: 5170-0174 OT Time Calculation (min): 33 min Charges:  OT General Charges $OT Visit: 1 Visit OT Evaluation $OT Eval Low Complexity: 1 Low  [name], OTR/L [phone] 06/03/2018  [name] 06/03/2018, 9:50 AM

## 2018-06-03 NOTE — Discharge Summary (Signed)
Physician Discharge Summary  [name] DOB: Apr 19, 1961 DOA: 06/01/2018  PCP: [name]., PA-C  Admit date: 06/01/2018 Discharge date: 06/03/2018  Admitted From: Home Disposition: Home with Home Health  Recommendations for Outpatient Follow-up:  1. Follow up with PCP in 1-2 weeks 2. Follow up with Cardiology and EP Cardiology within 30 days 3. Have 30 Day Holter [place]  4. Follow up with Orthopedic Surgery within 2 weeks for Follow Up 5. NWB LE  6. Please obtain CMP/CBC, Mag, Phos in one week 7. 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 She fell to the floor but did not past out. She instantly felt pain in her left ankle and crawled to her desk where she called for help.  She had no focal neurological issues other than blurred vision. She has a h/o of BPPV but this did not feel like an episode of vertigo. She did not have chest pain palpitation, diaphoresis, nausea or vomiting.  In the ED she was found to be fluctuating from sinus brady 40s to A-flutter with RVR. She had a left ankle fracture. Cardiology and ortho have been called. She has received pain medication & oral Cardizem.Patient had Sinus Pauses so Cardizem and Amiodarone was discontinued. Patient underwent Left Ankle Surgical Repair and improved. Orthopedics stated she was ok to start Anticoagulation and  Discharge from their standpoint. Discussed Case with Cardiology who recommended Apixaban for Anticoagulation. Patient was deemed medically stable to D/C Home with [place] after PT Evaluated the patient. She will need to have a 30 day Event Monitor and follow up with PCP, Cardiology, and Orthopedic Surgery within the coming weeks.   Discharge Diagnoses:  Principal Problem:   Near syncope Active Problems:   Hereditary and idiopathic peripheral neuropathy   Alcoholic cirrhosis of liver (HCC)   Splenomegaly   Thrombocytopenia (HCC)   B12 deficiency   Chronic pain   Closed left ankle fracture   PAF (paroxysmal atrial fibrillation) (HCC)   Benign essential HTN   Atrial flutter (HCC)   Bradycardia  Near Syncope - Likely Related to tachy/brady episodes - ? If she was orthostatic as she is taking HCTZ - ? 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 Related to be rate related vs. Ischemia -Cardiology Following and appreciate further evaluation and recommendations -Continue to Monitor Volume Status Carefully -Follow up  with Cardiology at D/C  Left Ankle Fracture -Ortho has been called by ED- a splint has been ordered and placed -Pain medications ordered by Ortho  -DVT prophylaxis ordered and is anticoagulated with Apixaban starting  tonight -Patient to underwent Surgical Intervention yesterdayAM -PT/OT to Evaluate and Treat recommending Home Health PT/OT  Left knee Osteoarthritis and Effusion -Cont home pain meds  Benign essential HTN -Resume HCTZ at D/C- Cardizem being started but now held due to Sinus Pauses -Follow up with Cardiology as an out patient    Hypokalemia -Suspect this is related to HCTZ; Discussed with Cardiology who recommended continuing HCTZ -K+ was 3.4 and improved to 4.0 -She has been told to eat bananas daily by per PCP but she hates them -Replete as Necessary -Follow up with PCP to repeat CMP within 1 week   Peripheral neuropathy/ Chronic pain -She follows with pain management -Cont Home Regimen with Oxycodone/ APAP, Lyrica, Ibuprofen, Zanaflex -Given Dilaudid po by Ortho for pain Control   Alcoholic cirrhosis of liver Splenomegaly Thrombocytopenia -Continues to drink 1-2 x week and counseled -Patient's Platelet Count was 120 this AM -follow up with PCP  Hypothyroidism  -Patients TSH was 1.225 and Free T4 was 1.19  Constipation -C/w Senna 8.6 mg po BID, Linzess and Miralax PRN  HLD -C/w Rosuvastatin 10 mg po qHS  COPD - Breo-ellipta and Albuterol ordered  Nicotine abuse - Nicotine patch ordered- counseled and patient understands  GERD -C/w Famotidine 20 mg TID  Discharge Instructions  Discharge Instructions    (HEART FAILURE PATIENTS) Call MD:  Anytime you have any of the following symptoms: 1) 3 pound weight gain in 24 hours or 5 pounds in 1 week 2) shortness of breath, with or without a dry hacking cough 3) swelling in the hands, feet or stomach 4) if you have to sleep on extra pillows at night in order to breathe.   Complete by:  As directed    Call MD for:  difficulty breathing, headache or visual disturbances   Complete by:  As directed    Call MD for:  extreme fatigue   Complete by:  As directed    Call MD for:  hives   Complete by:  As directed     Call MD for:  persistant dizziness or light-headedness   Complete by:  As directed    Call MD for:  persistant nausea and vomiting   Complete by:  As directed    Call MD for:  redness, tenderness, or signs of infection (pain, swelling, redness, odor or green/yellow discharge around incision site)   Complete by:  As directed    Call MD for:  severe uncontrolled pain   Complete by:  As directed    Call MD for:  temperature >100.4   Complete by:  As directed    Diet - low sodium heart healthy   Complete by:  As directed    Diet - low sodium heart healthy   Complete by:  As directed    Discharge instructions   Complete by:  As directed    You were cared for by a hospitalist during your hospital stay. If you have any questions about your discharge medications or the care you received while you were in the hospital after you are discharged, you can call the unit and ask to speak with the hospitalist on call if the hospitalist that took care of you is not available. Once you are discharged, your primary care physician will handle any  further medical issues. Please note that NO REFILLS for any discharge medications will be authorized once you are discharged, as it is imperative that you return to your primary care physician (or establish a relationship with a primary care physician if you do not have one) for your aftercare needs so that they can reassess your need for medications and monitor your lab values.  Follow up with PCP, Cardiology (Regular Cardiology and EP Cardiology), and Orthopedics. Take all medications as prescribed. If symptoms change or worsen please return to the ED for evaluation   Increase activity slowly   Complete by:  As directed    Increase activity slowly   Complete by:  As directed    Other Restrictions   Complete by:  As directed    Non-Weight Bearing on Left Lower Extremity until seen by Orthopedics as an outpatient.     Allergies as of 06/03/2018      Reactions    Vancomycin Hives   Sulfamethoxazole    GI Upset      Medication List    STOP taking these medications   ibuprofen 800 MG tablet Commonly known as:  ADVIL,MOTRIN     TAKE these medications   albuterol 108 (90 Base) MCG/ACT inhaler Commonly known as:  PROVENTIL HFA;VENTOLIN HFA Inhale 2 puffs into the lungs every 6 (six) hours as needed for wheezing or shortness of breath.   apixaban 5 MG Tabs tablet Commonly known as:  ELIQUIS Take 1 tablet (5 mg total) by mouth 2 (two) times daily.   BREO ELLIPTA 200-25 MCG/INH Aepb Generic drug:  fluticasone furoate-vilanterol Inhale 1 puff into the lungs daily.   docusate sodium 100 MG capsule Commonly known as:  COLACE Take 1 capsule (100 mg total) by mouth 2 (two) times daily. While taking narcotic pain medicine.   famotidine 20 MG tablet Commonly known as:  PEPCID Take 20 mg by mouth 3 (three) times daily.   fluticasone 50 MCG/ACT nasal spray Commonly known as:  FLONASE Place 2 sprays into both nostrils daily.   folic acid 1 MG tablet Commonly known as:  FOLVITE Take 1 mg by mouth daily.   hydrochlorothiazide 25 MG tablet Commonly known as:  HYDRODIURIL Take 25 mg by mouth daily. Notes to patient:  Resume home regimen    HYDROmorphone 2 MG tablet Commonly known as:  DILAUDID Take 1 tablet (2 mg total) by mouth every 6 (six) hours as needed for up to 5 days for severe pain.   LINZESS 145 MCG Caps capsule Generic drug:  linaclotide Take 145 mcg by mouth 2 (two) times daily as needed (IBS symptoms).   LYRICA 150 MG capsule Generic drug:  pregabalin Take 150 mg by mouth 3 (three) times daily.   METANX 3-90.314-2-35 MG Caps Take 1 capsule by mouth 2 (two) times daily. Notes to patient:  Resume home regimen    naproxen 250 MG tablet Commonly known as:  NAPROSYN Take 1 tablet (250 mg total) by mouth 2 (two) times daily with a meal.   nicotine 14 mg/24hr patch Commonly known as:  NICODERM CQ - dosed in mg/24  hours Place 1 patch (14 mg total) onto the skin daily. Start taking on:  06/04/2018   oxyCODONE-acetaminophen 10-325 MG tablet Commonly known as:  PERCOCET Take 1 tablet by mouth every 4 (four) hours as needed for pain. What changed:  when to take this   PAZEO 0.7 % Soln Generic drug:  Olopatadine HCl Place 1 drop into both eyes daily  as needed (dry eyes).   polyethylene glycol packet Commonly known as:  MIRALAX / GLYCOLAX Take 17 g by mouth daily as needed for mild constipation.   rosuvastatin 10 MG tablet Commonly known as:  CRESTOR Take 10 mg by mouth at bedtime.   senna 8.6 MG Tabs tablet Commonly known as:  SENOKOT Take 2 tablets (17.2 mg total) by mouth 2 (two) times daily.   SYNTHROID 25 MCG tablet Generic drug:  levothyroxine Take 25 mcg by mouth daily before breakfast.   tiZANidine 4 MG tablet Commonly known as:  ZANAFLEX Take 4 mg by mouth at bedtime.   Vitamin D (Ergocalciferol) 50000 units Caps capsule Commonly known as:  DRISDOL Take 50,000 Units by mouth every 7 (seven) days. Wednesday Notes to patient:  Resume home regimen             Durable Medical Equipment  (From admission, onward)         Start     Ordered   06/03/18 1256  DME 3-in-1  Once     06/03/18 1258   06/03/18 1256  For home use only DME Walker rolling  [facility])  Once    Question:  Patient needs a walker to treat with the following condition  Answer:  Closed left ankle fracture, initial encounter   06/03/18 1258   06/03/18 1254  For home use only DME standard manual wheelchair with seat cushion  Once    Comments:  Patient suffers from Left Ankle Fracture which impairs their ability to perform daily activities like Ambulating in the home as she is NWB.  A Rolling [place] will not resolve  issue with performing activities of daily living. A wheelchair will allow patient to safely perform daily activities. Patient can safely propel the wheelchair in the home or has a caregiver who can  provide assistance.  Accessories: elevating leg rests (ELRs) (LEFT one especially), wheel locks, extensions and anti-tippers.   06/03/18 1254         Follow-up Information    Wylene Simmer, MD. Schedule an appointment as soon as possible for a visit in 2 week(s).   Specialty:  Orthopedic Surgery Contact information: [address] [place] [address] [phone]        [name]., PA-C. Call.   Specialty:  Family Medicine Why:  Follow up within 1 week Contact information: [address] [place] [address] [phone]        Josue Hector, MD .   Specialty:  Cardiology Contact information: [phone] [place] [address] [phone]          Allergies  Allergen Reactions  . Vancomycin Hives  . Sulfamethoxazole     GI Upset   Consultations:  Cardiology  Orthopedic Surgery  Procedures/Studies: Dg Ankle Complete Left  Result Date: 06/01/2018 CLINICAL DATA:  57 year old female status post fall at work with severe ankle pain. EXAM: LEFT ANKLE COMPLETE - 3+ VIEW COMPARISON:  Left ankle series 07/25/2006. FINDINGS: Oblique comminuted fracture of the distal left fibula metadiaphysis with lateral angulation and mild lateral displacement. Associated lateral subluxation of the mortise joint with combined acute and chronic medial malleolus bone fragments. Associated abnormal widening of the tibiofibular syndesmosis. No other distal tibia fracture. Mild irregularity of the talar dome appears somewhat non acute. Calcaneus appears stable and intact. There is fragmentation of the navicular bone which is new since 2007 and appears to be associated with new but chronic TMT joint degeneration.  IMPRESSION: 1. Acute comminuted fracture of the distal left fibula metadiaphysis with mild lateral displacement and angulation. 2. Associated injury to the tibia-fibular syndesmosis, small avulsions at the medial malleolus, and lateral  subluxation of the mortise joint. 3. Age indeterminate but probably chronic fragmentation of the navicular, in association with other chronic distal tarsal bone degeneration. Electronically Signed   By: Genevie Ann M.D.   On: 06/01/2018 08:52   Ct Head Wo Contrast  Result Date: 06/01/2018 CLINICAL DATA:  Focal neuro deficit, greater than 6 hours stroke suspected. Altered level of consciousness. Fall today with possible loss consciousness. EXAM: CT HEAD WITHOUT CONTRAST TECHNIQUE: Contiguous axial images were obtained from the base of the skull through the vertex without intravenous contrast. COMPARISON:  None. FINDINGS: Brain: No acute infarct, hemorrhage, or mass lesion is present. The ventricles are of normal size. No significant extraaxial fluid collection is present. No significant white matter disease is present. Brainstem and cerebellum are normal. Vascular: No hyperdense vessel or unexpected calcification. Skull: Calvarium is intact. No focal lytic or blastic lesions are present. No significant extracranial soft tissue injury is present. Sinuses/Orbits: The paranasal sinuses and mastoid air cells are clear. Globes and orbits are within normal limits. IMPRESSION: Negative CT of the head. Electronically Signed   By: San Morelle M.D.   On: 06/01/2018 12:29   Ct Foot Right Wo Contrast  Result Date: 05/18/2018 CLINICAL DATA:  Foot pain, swelling, and deformity. EXAM: CT OF THE RIGHT FOOT WITHOUT CONTRAST TECHNIQUE: Multidetector CT imaging of the right foot was performed according to the standard protocol. Multiplanar CT image reconstructions were also generated. COMPARISON:  None. FINDINGS: Bones/Joint/Cartilage Charcot arthropathy of the midfoot with bony irregularity and fragmentation along with loss of articular space, subcortical cyst formation, and spurring primarily at the articulations between the navicular, cuneiform spur, and cuboid. There is questionably some bony bridging between the  cuboid and lateral cuneiform. Multiple small wall corticated bony fragments are present especially dorsally along the midfoot. There is also some arthropathy at the calcaneocuboid articulation with spurring, subcortical sclerosis, and thinning of the articulation. At the Lisfranc joint, there is articular space narrowing and spurring, with the base of the fourth metatarsal slightly cephalad in position compared to the cuboid as shown on image 21/8, but without overt dislocation at the Lisfranc joint. Plantar and Achilles calcaneal spurs are present. There is spurring of the distal articular rim of the tibia with some fragmentation posteriorly as shown on image 38/8. Os trigonum noted. Borderline appearance for Haglund deformity. No compelling bony destructive findings characteristic of osteomyelitis. Ligaments Suboptimally assessed by CT. Muscles and Tendons Abnormal thickening of the distal tibialis posterior tendon favoring tendinopathy. Thickened medial band of the plantar fascia with some punctate internal calcifications. Soft tissues Unremarkable IMPRESSION: 1. Moderate Charcot arthropathy of the midfoot. 2. Thickening of the medial band of the plantar fascia suggesting plantar fasciitis. 3. Borderline appearance for Haglund deformity. 4. Thickening of the distal tibialis posterior tendon, correlate clinically in assessing for tibialis posterior dysfunction. Electronically Signed   By: Van Clines M.D.   On: 05/18/2018 08:48   Dg Knee Complete 4 Views Left  Result Date: 06/01/2018 CLINICAL DATA:  [place] at work today.  Leg pain. EXAM: LEFT KNEE - COMPLETE 4+ VIEW COMPARISON:  None. FINDINGS: Osteoarthritis, most pronounced in the medial compartment and patellofemoral joint. Several intra articular loose bodies. Moderate joint effusion. No evidence of fracture. IMPRESSION: Advanced chronic osteoarthritis. Loose bodies. Joint effusion. No acute fracture. Electronically Signed  By: Nelson Chimes M.D.    On: 06/01/2018 09:46   Ct Chest Lung Ca Screen Low Dose W/o Cm  Result Date: 05/14/2018 CLINICAL DATA:  57 year old female current smoker with 34 pack-year history of smoking. Lung cancer screening examination. EXAM: CT CHEST WITHOUT CONTRAST LOW-DOSE FOR LUNG CANCER SCREENING TECHNIQUE: Multidetector CT imaging of the chest was performed following the standard protocol without IV contrast. COMPARISON:  No priors. FINDINGS: Cardiovascular: Heart size is normal. There is no significant pericardial fluid, thickening or pericardial calcification. There is aortic atherosclerosis, as well as atherosclerosis of the great vessels of the mediastinum and the coronary arteries, including calcified atherosclerotic plaque in the left anterior descending coronary artery. Mediastinum/Nodes: No pathologically enlarged mediastinal or hilar lymph nodes. Please note that accurate exclusion of hilar adenopathy is limited on noncontrast CT scans. Esophagus is unremarkable in appearance. No axillary lymphadenopathy. Lungs/Pleura: Tiny right lower lobe pulmonary nodule (axial image 231 of series 3), with a volume derived mean diameter of 3 mm. No larger more suspicious appearing pulmonary nodules or masses are noted. No acute consolidative airspace disease. No pleural effusions. Mild diffuse bronchial wall thickening with very mild centrilobular and paraseptal emphysema. Upper Abdomen: Liver has a shrunken appearance and nodular contour, indicative of severe hepatic cirrhosis. Musculoskeletal: Spinal cord stimulator extending into the mid to lower thoracic region. There are no aggressive appearing lytic or blastic lesions noted in the visualized portions of the skeleton. IMPRESSION: 1. Lung-RADS 2S, benign appearance or behavior. Continue annual screening with low-dose chest CT without contrast in 12 months. 2. The "S" modifier above refers to potentially clinically significant non lung cancer related findings. Specifically, there is  aortic atherosclerosis, in addition to left anterior descending coronary artery disease. Please note that although the presence of coronary artery calcium documents the presence of coronary artery disease, the severity of this disease and any potential stenosis cannot be assessed on this non-gated CT examination. Assessment for potential risk factor modification, dietary therapy or pharmacologic therapy may be warranted, if clinically indicated. 3. Mild diffuse bronchial wall thickening with very mild centrilobular and paraseptal emphysema; imaging findings suggestive of underlying COPD. 4. Cirrhosis. Aortic Atherosclerosis (ICD10-I70.0) and Emphysema (ICD10-J43.9). Electronically Signed   By: Vinnie Langton M.D.   On: 05/14/2018 16:40    ECHOCARDIOGRAM 06/01/18 ------------------------------------------------------------------- Study Conclusions  - Left ventricle: Mild diffuse hypo kinesis with abnormal septal   motion likely from LBBB. The cavity size was mildly dilated. Wall   thickness was normal. Systolic function was mildly reduced. The   estimated ejection fraction was in the range of 45% to 50%. Left   ventricular diastolic function parameters were normal. - Mitral valve: Calcified annulus. Mildly thickened leaflets . - Left atrium: The atrium was moderately dilated. - Right ventricle: The cavity size was mildly dilated. - Right atrium: The atrium was moderately dilated. - Atrial septum: No defect or patent foramen ovale was identified.  Subjective: At bedside and patient states that is doing well.  No chest pain, shortness breath, nausea, vomiting.  Cardiology and orthopedic cleared the patient to go home and she is excited.  Home health PT was recommended and she was stable to be discharged and has no other complaints or concerns at this time  Discharge Exam: Vitals:   06/03/18 1123 06/03/18 1200  BP:  137/61  Pulse:  62  Resp:  16  Temp:  98.4 F (36.9 C)  SpO2: 99% 97%    Vitals:   06/03/18 0600 06/03/18 0800 06/03/18 1123  06/03/18 1200  BP: (!) 128/55 (!) 156/66  137/61  Pulse: (!) 55 (!) 52  62  Resp: 15 12  16   Temp:  98.3 F (36.8 C)  98.4 F (36.9 C)  TempSrc:  Oral  Oral  SpO2: 94% 93% 99% 97%  Weight:      Height:       General: Pt is alert, awake, not in acute distress Cardiovascular: RRR and slightly on the slower side, S1/S2 +, no rubs, no gallops Respiratory: Diminished bilaterally, no wheezing, no rhonchi Abdominal: Soft, NT, ND, bowel sounds + Extremities: no edema, no cyanosis; Left Ankle wrapped  The results of significant diagnostics from this hospitalization (including imaging, microbiology, ancillary and laboratory) are listed below for reference.    Microbiology: Recent Results (from the past 240 hour(s))  Surgical PCR screen     Status: None   Collection Time: 06/01/18  5:41 PM  Result Value Ref Range Status   MRSA, PCR NEGATIVE NEGATIVE Final   Staphylococcus aureus NEGATIVE NEGATIVE Final    Comment: (NOTE) The Xpert SA Assay (FDA approved for NASAL specimens in patients 12 years of age and older), is one component of a comprehensive surveillance program. It is not intended to diagnose infection nor to guide or monitor treatment. Performed at [facility], [place] [address] [place], [place] [address]     Labs: BNP (last 3 results) No results for input(s): BNP in the last 8760 hours. Basic Metabolic Panel: Recent Labs  Lab 06/01/18 0859 06/02/18 0325 06/03/18 0316  NA 142 142 138  K 3.4* 3.4* 4.0  CL 104 104 103  CO2 27 28 25   GLUCOSE 129* 123* 269*  BUN 15 17 14   CREATININE 0.92 0.68 0.70  CALCIUM 9.4 9.2 9.2  MG 2.1 2.0 1.9  PHOS  --  3.7 1.6*   Liver Function Tests: Recent Labs  Lab 06/01/18 0859 06/03/18 0316  AST 21 21  ALT 16 16  ALKPHOS 70 66  BILITOT 0.9 0.6  PROT 6.7 6.4*  ALBUMIN 3.7 3.4*   No results for input(s): LIPASE, AMYLASE in the last 168 hours. No results  for input(s): AMMONIA in the last 168 hours. CBC: Recent Labs  Lab 06/01/18 0859 06/02/18 0325 06/03/18 0316  WBC 7.3 5.7 6.5  NEUTROABS 4.8  --  5.8  HGB 14.7 13.3 13.4  HCT 44.6 41.6 40.8  MCV 100.0 100.7* 99.8  PLT 121* 128* 120*   Cardiac Enzymes: Recent Labs  Lab 06/01/18 0859  TROPONINI <0.03   BNP: Invalid input(s): POCBNP CBG: No results for input(s): GLUCAP in the last 168 hours. D-Dimer No results for input(s): DDIMER in the last 72 hours. Hgb A1c No results for input(s): HGBA1C in the last 72 hours. Lipid Profile Recent Labs    06/02/18 0325  CHOL 119  HDL 39*  LDLCALC 59  TRIG 104  CHOLHDL 3.1   Thyroid function studies Recent Labs    06/02/18 0325  TSH 1.225   Anemia work up No results for input(s): VITAMINB12, FOLATE, FERRITIN, TIBC, IRON, RETICCTPCT in the last 72 hours. Urinalysis No results found for: COLORURINE, APPEARANCEUR, [place], [place], [place], [place], [place], [place], PROTEINUR, UROBILINOGEN, NITRITE, LEUKOCYTESUR Sepsis Labs Invalid input(s): PROCALCITONIN,  WBC,  [facility] Microbiology Recent Results (from the past 240 hour(s))  Surgical PCR screen     Status: None   Collection Time: 06/01/18  5:41 PM  Result Value Ref Range Status   MRSA, PCR NEGATIVE NEGATIVE Final   Staphylococcus  aureus NEGATIVE NEGATIVE Final    Comment: (NOTE) The Xpert SA Assay (FDA approved for NASAL specimens in patients 86 years of age and older), is one component of a comprehensive surveillance program. It is not intended to diagnose infection nor to guide or monitor treatment. Performed at [facility], [place] [address] [place], [place] [address]    Time coordinating discharge: 35 minutes  SIGNED:  Kerney Elbe, DO [facility] 06/03/2018, 2:17 PM Pager is on [place]  If 7PM-7AM, please contact night-coverage [email or web address] Password [email or web address]

## 2018-06-03 NOTE — Progress Notes (Addendum)
Progress Note  Patient Name: [name] Date of Encounter: 06/03/2018  Primary Cardiologist: Jenkins Rouge, MD   Subjective   Pt tolerated surgery well. She denies chest pain, SOB, and palpitations. She has not had a recurrence of dizziness. Sinus brady on telemetry.  Inpatient Medications    Scheduled Meds: . docusate sodium  100 mg Oral BID  . enoxaparin (LOVENOX) injection  40 mg Subcutaneous Q24H  . famotidine  20 mg Oral TID  . fluticasone  2 spray Each Nare Daily  . fluticasone furoate-vilanterol  1 puff Inhalation Daily  . folic acid  1 mg Oral Daily  . levothyroxine  25 mcg Oral QAC breakfast  . mupirocin ointment  1 application Nasal BID  . naproxen  250 mg Oral BID WC  . nicotine  14 mg Transdermal Daily  . phosphorus  500 mg Oral BID  . pregabalin  150 mg Oral TID  . rosuvastatin  10 mg Oral QHS  . senna  1 tablet Oral BID  . tiZANidine  4 mg Oral QHS   Continuous Infusions: . sodium chloride 75 mL/hr at 06/03/18 0436   PRN Meds: acetaminophen, albuterol, bisacodyl, HYDROmorphone (DILAUDID) injection, linaclotide, magnesium citrate, metoCLOPramide **OR** metoCLOPramide (REGLAN) injection, olopatadine, ondansetron **OR** ondansetron (ZOFRAN) IV, oxyCODONE, oxyCODONE, polyethylene glycol   Vital Signs    Vitals:   06/03/18 0000 06/03/18 0439 06/03/18 0600 06/03/18 0800  BP:  (!) 145/60 (!) 128/55 (!) 156/66  Pulse:  (!) 50 (!) 55 (!) 52  Resp:  13 15 12   Temp: 98.4 F (36.9 C) 98 F (36.7 C)  98.3 F (36.8 C)  TempSrc: Oral Oral  Oral  SpO2:  93% 94% 93%  Weight:      Height:        Intake/Output Summary (Last 24 hours) at 06/03/2018 0939 Last data filed at 06/03/2018 0650 Gross per 24 hour  Intake 1775.63 ml  Output 1700 ml  Net 75.63 ml   Filed Weights   06/01/18 0742 06/01/18 2040 06/02/18 1203  Weight: 117.9 kg 124.5 kg 124.5 kg    Telemetry    Sinus bradycardia - Personally Reviewed  ECG    No new tracings - Personally  Reviewed  Physical Exam   GEN: No acute distress.   Neck: No JVD Cardiac: RRR, no murmurs, rubs, or gallops.  Respiratory: wheezing throughout bilaterally GI: Soft, nontender, non-distended  MS: No edema; left leg, ankle, and foot wrapped Neuro:  Nonfocal  Psych: Normal affect   Labs    Chemistry Recent Labs  Lab 06/01/18 0859 06/02/18 0325 06/03/18 0316  NA 142 142 138  K 3.4* 3.4* 4.0  CL 104 104 103  CO2 27 28 25   GLUCOSE 129* 123* 269*  BUN 15 17 14   CREATININE 0.92 0.68 0.70  CALCIUM 9.4 9.2 9.2  PROT 6.7  --  6.4*  ALBUMIN 3.7  --  3.4*  AST 21  --  21  ALT 16  --  16  ALKPHOS 70  --  66  BILITOT 0.9  --  0.6  GFRNONAA >60 >60 >60  GFRAA >60 >60 >60  ANIONGAP 11 10 10      Hematology Recent Labs  Lab 06/01/18 0859 06/02/18 0325 06/03/18 0316  WBC 7.3 5.7 6.5  RBC 4.46 4.13 4.09  HGB 14.7 13.3 13.4  HCT 44.6 41.6 40.8  MCV 100.0 100.7* 99.8  MCH 33.0 32.2 32.8  MCHC 33.0 32.0 32.8  RDW 14.1 14.4 13.7  PLT 121*  128* 120*    Cardiac Enzymes Recent Labs  Lab 06/01/18 0859  TROPONINI <0.03   No results for input(s): TROPIPOC in the last 168 hours.   BNPNo results for input(s): BNP, PROBNP in the last 168 hours.   DDimer No results for input(s): DDIMER in the last 168 hours.   Radiology    Ct Head Wo Contrast  Result Date: 06/01/2018 CLINICAL DATA:  Focal neuro deficit, greater than 6 hours stroke suspected. Altered level of consciousness. Fall today with possible loss consciousness. EXAM: CT HEAD WITHOUT CONTRAST TECHNIQUE: Contiguous axial images were obtained from the base of the skull through the vertex without intravenous contrast. COMPARISON:  None. FINDINGS: Brain: No acute infarct, hemorrhage, or mass lesion is present. The ventricles are of normal size. No significant extraaxial fluid collection is present. No significant white matter disease is present. Brainstem and cerebellum are normal. Vascular: No hyperdense vessel or unexpected  calcification. Skull: Calvarium is intact. No focal lytic or blastic lesions are present. No significant extracranial soft tissue injury is present. Sinuses/Orbits: The paranasal sinuses and mastoid air cells are clear. Globes and orbits are within normal limits. IMPRESSION: Negative CT of the head. Electronically Signed   By: San Morelle M.D.   On: 06/01/2018 12:29    Cardiac Studies   Echo 06/01/18: Study Conclusions - Left ventricle: Mild diffuse hypo kinesis with abnormal septal   motion likely from LBBB. The cavity size was mildly dilated. Wall   thickness was normal. Systolic function was mildly reduced. The   estimated ejection fraction was in the range of 45% to 50%. Left   ventricular diastolic function parameters were normal. - Mitral valve: Calcified annulus. Mildly thickened leaflets . - Left atrium: The atrium was moderately dilated. - Right ventricle: The cavity size was mildly dilated. - Right atrium: The atrium was moderately dilated. - Atrial septum: No defect or patent foramen ovale was identified.  Patient Profile     57 y.o. female with a hx of ongoing tobacco abuse, former alcohol abuse, cirrhosis (pt states prior EGD negative for varices), B12 deficiency, peripheral neuropathy, BPPV, aortic atherosclerosis/coronary atherosclerosis by CT 05/2018 along with COPD, hypothyroidism, HLD, spinal stenosis with spinal stimulatorwho is being seen for the evaluation of bradycardia and irregular rhythm.   Assessment & Plan    1. Pauses, PAF, tachy-brady, new LBBB - telemetry with sinus bradycardia - no amiodarone ordered for now - question if her dizziness was Afib - discussed 30 day monitor when discharged - she is asymptomatic This patients CHA2DS2-VASc Score and unadjusted Ischemic Stroke Rate (% per year) is equal to 2.2 % stroke rate/year from a score of 2 (female, CHF) - not on anticoagulation yet, lovenox PPX - will start AC once cleared by surgery  2. New  onset systolic heart failure - echo with eF of 45-50%  - possibly due to tachy-brady vs ischemia - question heart cath for new LBBB, atrial flutter, and reduced EF - will discuss with attending  3. Dizziness - question if this was fib/flutter - will order 30 day monitor at discharge  4. Coronary atherosclerosis by CT 05/2018 - 06/02/2018: Cholesterol 119; HDL 39; LDL Cholesterol 59; Triglycerides 104; VLDL 21 - continue statin     For questions or updates, please contact [place] Please consult [email or web address] for contact info under Cardiology/STEMI.      Signed, Tami Lin Duke, PA  06/03/2018, 9:39 AM    Rhythm stable SR 60's no PAF given bradycardia will not put  on oral amiodarone Start eliquis for stroke prevention and DVT prophylaxis when ok with surgery  Outpatient event monitor and f/u EP 30 days post d/c   Jenkins Rouge

## 2018-06-04 ENCOUNTER — Telehealth: Payer: Self-pay | Admitting: Radiology

## 2018-06-04 NOTE — Telephone Encounter (Signed)
Patient called and asked for her event monitor to be mailed to her house instead of her coming in.She has been enrolled with [employer] and it will be shipped to her

## 2018-06-11 ENCOUNTER — Encounter: Payer: Self-pay | Admitting: Cardiology

## 2018-06-12 ENCOUNTER — Encounter: Payer: Self-pay | Admitting: Cardiovascular Disease

## 2018-06-12 ENCOUNTER — Encounter (INDEPENDENT_AMBULATORY_CARE_PROVIDER_SITE_OTHER): Payer: BC Managed Care – PPO

## 2018-06-12 DIAGNOSIS — I495 Sick sinus syndrome: Secondary | ICD-10-CM | POA: Diagnosis not present

## 2018-06-12 DIAGNOSIS — I480 Paroxysmal atrial fibrillation: Secondary | ICD-10-CM | POA: Diagnosis not present

## 2018-06-15 ENCOUNTER — Telehealth: Payer: Self-pay

## 2018-06-15 NOTE — Telephone Encounter (Signed)
Called patient about monitor report. Patient had two urgent reports.   1-9/06/2018   20:29  Sinus Rhythm with IVCD and New Onset Atrial Flutter HR 125  2-9/11/2017  23:15  Sinus Bradycardia with PAC IVCD intermittent Atrial Fibrillation/Flutter and 4.2 second pause HR 100   Patient stated at the 1st time patient was getting dinner together and used the bathroom. Patient has trouble getting around, since she has a broken leg. Patient stated she was having pain in her leg and SOB at the time as well. Patient stated the 2nd time she was getting up to go to the bathroom. Patient stated she was doing fine right now, but she would keep a better record as she continues to wear monitor.  Will consult DOD, Dr. Acie Fredrickson. Physician interpretation Post conversion pause of approximately 4 seconds. She is not on any HR slowing meds.  Dr. Acie Fredrickson recommends patient should not be driving. Patient has follow up with Dr. Curt Bears in 1 1/2 weeks and should keep this appointment.   Called patient to inform her of recommendations. Patient verbalized understanding.

## 2018-06-19 ENCOUNTER — Telehealth: Payer: Self-pay | Admitting: Internal Medicine

## 2018-06-19 NOTE — Telephone Encounter (Signed)
Cardiology Moonlighter Note  Returned page from [employer]. Patient had episode of atrial fibrillation with 4 second conversion pause. Now back in sinus rhythm. Will notify Dr. Adaline Sill Ella Bodo, MD Cardiology Fellow, PGY-6

## 2018-06-21 ENCOUNTER — Telehealth: Payer: Self-pay

## 2018-06-21 NOTE — Telephone Encounter (Signed)
Patient had two urgent monitor reports.  1- 06/18/18 at 21:20 EDT- Monitor showed Sinus Rhythm with IVCD intermittent atrial Fibrillation/Flutter with IVCD and 4.4 second pause.  2-9/13/19 at 23:46 EDT- Monitor showed Atrial Fibrillation/Flutter with 4 second pause offset into Sinus Rhythm with IVCD.  Called patient back about her monitor. Patient stated she is not feeling any of these rhythms. Patient stated she was trying to sleep in a wheelchair sitting up, because her electric chair was not working.   Consulted Dr. Harrington Challenger, DOD, no changes and keep appointment on Friday with Dr. Curt Bears.

## 2018-06-25 ENCOUNTER — Ambulatory Visit: Payer: BC Managed Care – PPO | Admitting: Cardiology

## 2018-06-25 ENCOUNTER — Encounter: Payer: Self-pay | Admitting: Cardiology

## 2018-06-25 VITALS — BP 120/60 | HR 77 | Ht 69.0 in | Wt 274.0 lb

## 2018-06-25 DIAGNOSIS — I25119 Atherosclerotic heart disease of native coronary artery with unspecified angina pectoris: Secondary | ICD-10-CM | POA: Diagnosis not present

## 2018-06-25 DIAGNOSIS — I480 Paroxysmal atrial fibrillation: Secondary | ICD-10-CM

## 2018-06-25 DIAGNOSIS — I5020 Unspecified systolic (congestive) heart failure: Secondary | ICD-10-CM

## 2018-06-25 MED ORDER — DRONEDARONE HCL 400 MG PO TABS: 400.0000 mg | ORAL_TABLET | Freq: Two times a day (BID) | ORAL | 6 refills | Status: DC

## 2018-06-25 NOTE — Progress Notes (Signed)
Electrophysiology Office Note   Date:  06/25/2018   ID:  [name], DOB [birth year], MRN [id number]  PCP:  Aletha Halim., PA-C  Cardiologist:  Johnsie Cancel Primary Electrophysiologist:  Esten Dollar Meredith Leeds, MD    No chief complaint on file.    History of Present Illness: [name] is a 57 y.o. female who is being seen today for the evaluation of tachy/brady syndrome at the request of Isaiah Serge, NP. Presenting today for electrophysiology evaluation.  She has a history of tobacco abuse, alcohol abuse, cirrhosis, B12 deficiency, aortic atherosclerosis, and coronary disease by CT scan, COPD, hypothyroidism, hyperlipidemia.  She presented to the hospital in August 2019 with a brown out episode.  The morning of the event, she woke up feeling well at 4 AM prior to going to work.  She felt more tired than usual.  She works in a [employer].  She was standing in line when she felt like a table that collapsed.  Her legs went underneath her.  She did not trip or lose her balance.  She nearly passed out but did not completely lose consciousness.  In the emergency room she was noted to be intermittently bradycardic into the mid 40s as well as tachycardic into the 120s which appeared to be atrial flutter/fibrillation.  She was noted to have atrial fibrillation and atrial flutter while she was hospitalized.  She had intermittent 3.4-second pauses.  She was put on IV amiodarone during her hospitalization.  She was started on Eliquis.  She was discharged without rhythm control.    Today, she denies symptoms of palpitations, chest pain, shortness of breath, orthopnea, PND, lower extremity edema, claudication, dizziness, presyncope, syncope, bleeding, or neurologic sequela. The patient is tolerating medications without difficulties.    Past Medical History:  Diagnosis Date  . Alcohol abuse    a. quit several years ago.  . Allergy   . Anemia    younger  . Arthritis    feet,knees  . B12 deficiency   . BPPV (benign paroxysmal positional vertigo)   . Cirrhosis ([place])   . Complication of anesthesia    "slow to wake"  . Constipation    on movantik- stools regular and soft on this  . Foot drop   . Hyperlipidemia   . Hypothyroidism   . Neuromuscular disorder (H[facility])    idiopathic neuropathy  . Spinal stenosis   . Tobacco abuse    Past Surgical History:  Procedure Laterality Date  . ANKLE SURGERY    . CERVICAL CONE BIOPSY    . CESAREAN SECTION    . COLONOSCOPY    . HERNIA REPAIR    . KNEE SURGERY     x5  . ORIF ANKLE FRACTURE Left 06/02/2018   Procedure: OPEN REDUCTION INTERNAL FIXATION (ORIF) ANKLE FRACTURE;  Surgeon: Wylene Simmer, MD;  Location: [facility];  Service: Orthopedics;  Laterality: Left;  . POLYPECTOMY    . SPINAL CORD STIMULATOR INSERTION N/A 05/07/2016   Procedure: LUMBAR SPINAL CORD STIMULATOR INSERTION;  Surgeon: Melina Schools, MD;  Location: [place];  Service: Orthopedics;  Laterality: N/A;     Current Outpatient Medications  Medication Sig Dispense Refill  . albuterol (PROVENTIL HFA;VENTOLIN HFA) 108 (90 Base) MCG/ACT inhaler Inhale 2 puffs into the lungs every 6 (six) hours as needed for wheezing or shortness of breath.     Marland Kitchen apixaban (ELIQUIS) 5 MG TABS tablet Take 1 tablet (5 mg total) by mouth 2 (two) times daily. [place]  tablet 0  . BREO ELLIPTA 200-25 MCG/INH AEPB Inhale 1 puff into the lungs daily.  11  . docusate sodium (COLACE) 100 MG capsule Take 1 capsule (100 mg total) by mouth 2 (two) times daily. While taking narcotic pain medicine. 30 capsule 0  . famotidine (PEPCID) 20 MG tablet Take 20 mg by mouth 3 (three) times daily.  0  . fluticasone (FLONASE) 50 MCG/ACT nasal spray Place 2 sprays into both nostrils daily.  11  . folic acid (FOLVITE) 1 MG tablet Take 1 mg by mouth daily.     . hydrochlorothiazide (HYDRODIURIL) 25 MG tablet Take 25 mg by mouth daily.  1  . L-Methylfolate-Algae-B12-B6 (METANX) 3-90.314-2-35 MG CAPS Take 1  capsule by mouth 2 (two) times daily.  5  . levothyroxine (SYNTHROID) 25 MCG tablet Take 25 mcg by mouth daily before breakfast.     . LINZESS 145 MCG CAPS capsule Take 145 mcg by mouth 2 (two) times daily as needed (IBS symptoms).     . naproxen (NAPROSYN) 250 MG tablet Take 1 tablet (250 mg total) by mouth 2 (two) times daily with a meal. 10 tablet 0  . nicotine (NICODERM CQ - DOSED IN MG/24 HOURS) 14 mg/24hr patch Place 1 patch (14 mg total) onto the skin daily. 28 patch 0  . oxyCODONE-acetaminophen (PERCOCET) 10-325 MG tablet Take 1 tablet by mouth every 4 (four) hours as needed for pain. (Patient taking differently: Take 1 tablet by mouth every 6 (six) hours as needed for pain. ) 60 tablet 0  . PAZEO 0.7 % SOLN Place 1 drop into both eyes daily as needed (dry eyes).   3  . polyethylene glycol (MIRALAX / GLYCOLAX) packet Take 17 g by mouth daily as needed for mild constipation. 14 each 0  . pregabalin (LYRICA) 150 MG capsule Take 150 mg by mouth 3 (three) times daily.     . rosuvastatin (CRESTOR) 10 MG tablet Take 10 mg by mouth at bedtime.     . senna (SENOKOT) 8.6 MG TABS tablet Take 2 tablets (17.2 mg total) by mouth 2 (two) times daily. 30 each 0  . tiZANidine (ZANAFLEX) 4 MG tablet Take 4 mg by mouth at bedtime.     . Vitamin D, Ergocalciferol, (DRISDOL) 50000 units CAPS capsule Take 50,000 Units by mouth every 7 (seven) days. Wednesday    . dronedarone (MULTAQ) 400 MG tablet Take 1 tablet (400 mg total) by mouth 2 (two) times daily with a meal. 60 tablet 6   No current facility-administered medications for this visit.     Allergies:   Vancomycin and Sulfamethoxazole   Social History:  The patient  reports that she has been smoking cigarettes. She has a 30.00 pack-year smoking history. She has never used smokeless tobacco. She reports that she drank alcohol. She reports that she does not use drugs.   Family History:  The patient's family history is not on file. She was adopted.     ROS:  Please see the history of present illness.   Otherwise, review of systems is positive for sweating, chills, leg pain, constipation, balance problems, muscle pain, rash.   All other systems are reviewed and negative.    PHYSICAL EXAM: VS:  BP 120/60   Pulse 77   Ht 5\' 9"  (1.753 m)   Wt 274 lb (124.3 kg)   LMP  (LMP Unknown) Comment: Pt under anesthesia in OR  SpO2 95%   BMI 40.46 kg/m  , BMI Body mass index  is 40.46 kg/m. GEN: Well nourished, well developed, in no acute distress  HEENT: normal  Neck: no JVD, carotid bruits, or masses Cardiac: RRR; no murmurs, rubs, or gallops,no edema  Respiratory:  clear to auscultation bilaterally, normal work of breathing GI: soft, nontender, nondistended, + BS MS: no deformity or atrophy  Skin: warm and dry Neuro:  Strength and sensation are intact Psych: euthymic mood, full affect  EKG:  EKG is not ordered today. Personal review of the ekg ordered shows SR, LBBB  Recent Labs: 06/02/2018: TSH 1.225 06/03/2018: ALT 16; BUN 14; Creatinine, Ser 0.70; Hemoglobin 13.4; Magnesium 1.9; Platelets 120; Potassium 4.0; Sodium 138    Lipid Panel     Component Value Date/Time   CHOL 119 06/02/2018 0325   TRIG 104 06/02/2018 0325   HDL 39 (L) 06/02/2018 0325   CHOLHDL 3.1 06/02/2018 0325   VLDL 21 06/02/2018 0325   LDLCALC 59 06/02/2018 0325     Wt Readings from Last 3 Encounters:  06/25/18 274 lb (124.3 kg)  06/02/18 274 lb 7.6 oz (124.5 kg)  10/02/16 244 lb (110.7 kg)      Other studies Reviewed: Additional studies/ records that were reviewed today include: TTE 06/01/18  Review of the above records today demonstrates:  - Left ventricle: Mild diffuse hypo kinesis with abnormal septal   motion likely from LBBB. The cavity size was mildly dilated. Wall   thickness was normal. Systolic function was mildly reduced. The   estimated ejection fraction was in the range of 45% to 50%. Left   ventricular diastolic function parameters  were normal. - Mitral valve: Calcified annulus. Mildly thickened leaflets . - Left atrium: The atrium was moderately dilated. - Right ventricle: The cavity size was mildly dilated. - Right atrium: The atrium was moderately dilated. - Atrial septum: No defect or patent foramen ovale was identified.   ASSESSMENT AND PLAN:  1.  Paroxysmal atrial fibrillation: Currently on Eliquis.  She is feeling well and has not had known recurrences of atrial fibrillation.  Her monitor does show episodes of rapid atrial fibrillation with up to 3-second pauses.  We Ellwyn Ergle start her on Multitak today.  This patients CHA2DS2-VASc Score and unadjusted Ischemic Stroke Rate (% per year) is equal to 2.2 % stroke rate/year from a score of 2  Above score calculated as 1 point each if present [CHF, HTN, DM, Vascular=MI/PAD/Aortic Plaque, Age if 65-74, or Female] Above score calculated as 2 points each if present [Age > 75, or Stroke/TIA/TE]  2.  Systolic heart failure: Ejection fraction 45 to 50%.  This was a new diagnosis in October.  She Kanani Mowbray follow-up with her primary cardiologist.  She Oline Belk likely need an evaluation of her coronaries.  3.  Coronary atherosclerosis: Found on CT scan 05/2018.  Continue statin.  Nicholos Aloisi order a coronary CT to evaluate.  Current medicines are reviewed at length with the patient today.   The patient does not have concerns regarding her medicines.  The following changes were made today: Start Multitak  Labs/ tests ordered today include:  Orders Placed This Encounter  Procedures  . CT CORONARY MORPH W/CTA COR W/SCORE W/CA W/CM &/OR WO/CM  . CT CORONARY FRACTIONAL FLOW RESERVE DATA PREP  . CT CORONARY FRACTIONAL FLOW RESERVE FLUID ANALYSIS  . EKG 12-Lead     Disposition:   FU with Beaux Wedemeyer 3 months  Signed, Laria Grimmett Meredith Leeds, MD  06/25/2018 4:28 PM     [address]  [place] [address] [phone] (office) [phone] (fax)

## 2018-06-25 NOTE — Patient Instructions (Addendum)
Medication Instructions:  Your physician has recommended you make the following change in your medication:  1. START Multaq 400 mg twice a day  * If you need a refill on your cardiac medications before your next appointment, please call your pharmacy.   Labwork: None ordered  Testing/Procedures: Your physician has requested that you have cardiac CT. Cardiac computed tomography (CT) is a painless test that uses an x-ray machine to take clear, detailed pictures of your heart. For further information please visit [email or web address]. Please follow instruction sheet as given.  Follow-Up: Your physician recommends that you schedule a follow-up appointment with Dr. Johnsie Cancel.  Your physician recommends that you schedule a follow-up appointment in: 3 months with Dr. Curt Bears.   *Please note that any paperwork needing to be filled out by the provider will need to be addressed at the front desk prior to seeing the provider. Please note that any FMLA, disability or other documents regarding health condition is subject to a $25.00 charge that must be received prior to completion of paperwork in the form of a money order or check.  Thank you for choosing [facility]!!   Trinidad Curet, RN [phone]  Any Other Special Instructions Will Be Listed Below (If Applicable).  Dronedarone tablets What is this medicine? DRONEDARONE (droe NE da rone) is an antiarrhythmic drug. It helps make your heart beat regularly. This medicine may be used for other purposes; ask your health care provider or pharmacist if you have questions. COMMON BRAND NAME(S): Multaq What should I tell my health care provider before I take this medicine? They need to know if you have any of these conditions: -heart failure -history of irregular heartbeat -liver disease -liver or lung problems with the past use of amiodarone -low levels of magnesium in the blood -low levels of potassium in the blood -other heart disease -an  unusual or allergic reaction to dronedarone, other medicines, foods, dyes, or preservatives -pregnant or trying to get pregnant -breast-feeding How should I use this medicine? Take this medicine by mouth with a glass of water. Follow the directions on the prescription label. Take one tablet with the morning meal and one tablet with the evening meal. Do not take your medicine more often than directed. Do not stop taking except on the advice of your doctor or health care professional. A special MedGuide will be given to you by the pharmacist with each prescription and refill. Be sure to read this information carefully each time. Talk to your pediatrician regarding the use of this medicine in children. Special care may be needed. Overdosage: If you think you have taken too much of this medicine contact a poison control center or emergency room at once. NOTE: This medicine is only for you. Do not share this medicine with others. What if I miss a dose? If you miss a dose, take it as soon as you can. If it is almost time for your next dose, take only that dose. Do not take double or extra doses. What may interact with this medicine? Do not take this medicine with any of the following medications: -arsenic trioxide -certain antibiotics like clarithromycin, erythromycin, pentamidine, telithromycin, troleandomycin -certain medicines for depression like tricyclic antidepressants -certain medicines for fungal infections like fluconazole, itraconazole, ketoconazole, posaconazole, voriconazole -certain medicines for irregular heart beat like amiodarone, disopyramide, dofetilide, flecainide, ibutilide, quinidine, propafenone, sotalol -certain medicines for malaria like chloroquine, halofantrine -cisapride -cyclosporine -droperidol -haloperidol -methadone -other medicines that prolong the QT interval (cause an abnormal heart rhythm) -pimozide -  nefazodone -phenothiazines like chlorpromazine, mesoridazine,  prochlorperazine, thioridazine -ritonavir -ziprasidone This medicine may also interact with the following medications: -certain medicines for blood pressure, heart disease, or irregular heart beat like diltiazem, metoprolol, propranolol, verapamil -certain medicines for cholesterol like atorvastatin, lovastatin, simvastatin -certain medicines for seizures like carbamazepine, phenobarbital, phenytoin -digoxin -grapefruit juice -rifampin -sirolimus -St. John's Wort -tacrolimus This list may not describe all possible interactions. Give your health care provider a list of all the medicines, herbs, non-prescription drugs, or dietary supplements you use. Also tell them if you smoke, drink alcohol, or use illegal drugs. Some items may interact with your medicine. What should I watch for while using this medicine? Your condition will be monitored closely when you first begin therapy. Often, this drug is first started in a hospital or other monitored health care setting. Once you are on maintenance therapy, visit your doctor or health care professional for regular checks on your progress. Because your condition and use of this medicine carry some risk, it is a good idea to carry an identification card, necklace or bracelet with details of your condition, medications, and doctor or health care professional. Dennis Bast may get drowsy or dizzy. Do not drive, use machinery, or do anything that needs mental alertness until you know how this medicine affects you. Do not stand or sit up quickly, especially if you are an older patient. This reduces the risk of dizzy or fainting spells. What side effects may I notice from receiving this medicine? Side effects that you should report to your doctor or health care professional as soon as possible: -allergic reactions like skin rash, itching or hives, swelling of the face, lips, or tongue -breathing problems -cough -dark urine -fast, irregular heartbeat -general ill  feeling or flu-like symptoms -light-colored stools -loss of appetite, nausea -right upper belly pain -slow heartbeat -stomach pain -swelling of the legs or ankles -unusually weak or tired -weight gain -yellowing of the eyes or skin Side effects that usually do not require medical attention (report to your doctor or health care professional if they continue or are bothersome): -nausea -vomiting -stomach pain This list may not describe all possible side effects. Call your doctor for medical advice about side effects. You may report side effects to FDA at 1-800-FDA-1088. Where should I keep my medicine? Keep out of the reach of children. Store at room temperature between 15 and 30 degrees C (59 and 86 degrees F). Throw away any unused medicine after the expiration date. NOTE: This sheet is a summary. It may not cover all possible information. If you have questions about this medicine, talk to your doctor, pharmacist, or health care provider.  2018 Elsevier/Gold Standard (2015-10-25 12:43:06)

## 2018-06-27 ENCOUNTER — Telehealth: Payer: Self-pay | Admitting: Cardiology

## 2018-06-27 NOTE — Telephone Encounter (Signed)
Called by the monitor company regarding patient's heart monitor. Reported to have been in Afib this morning with a rate in the 170s at times. This afternoon around 3:40pm converted to SR with a 6 second conversion pause. Now with last reading being SR. I called and talked with patient who reported she did feel dizzy this morning but has now improved this afternoon. No syncope. Currently on Eliquis and complaint with this. Rx multitak but has not picked up yet because her pharmacy had to order it. Planning to pick up in the am. Of note report from monitor was auto-triggered. Informed patient to continue on Eliquis and pick up Multitak. ER precautions given. Advised if near syncope or syncope would need to seek treatment. Continue to wear monitor. She voiced understanding.   Reino Bellis NP

## 2018-06-28 ENCOUNTER — Other Ambulatory Visit: Payer: Self-pay | Admitting: *Deleted

## 2018-06-28 MED ORDER — APIXABAN 5 MG PO TABS: 5.0000 mg | ORAL_TABLET | Freq: Two times a day (BID) | ORAL | 3 refills | Status: DC

## 2018-06-28 NOTE — Telephone Encounter (Signed)
Eliquis 5mg  refill request received; pt is 57 yrs old, wt-124.3kg, Crea-0.70 on 06/03/18, last seen by Dr. Curt Bears on 06/25/18; will send in refill to requested pharmacy.

## 2018-07-05 ENCOUNTER — Telehealth: Payer: Self-pay

## 2018-07-05 NOTE — Telephone Encounter (Signed)
Called patient about her monitor. Received fax of urgent report from 07/02/18 at 22:52 EDT that showed, sinus ryhthm and sinu bradycardia with IVCD Atrial Fibrillation and multiple pauses noted, longest pause is 3.5 seconds. Patient stated she had no symptoms at the time and started her multaq two days prior to this report. Consulted Dr. Curt Bears who saw patient on 06/25/18. He recommend patient to continue to be monitored.

## 2018-07-09 ENCOUNTER — Ambulatory Visit (INDEPENDENT_AMBULATORY_CARE_PROVIDER_SITE_OTHER): Payer: BC Managed Care – PPO | Admitting: *Deleted

## 2018-07-09 DIAGNOSIS — I4891 Unspecified atrial fibrillation: Secondary | ICD-10-CM

## 2018-07-09 NOTE — Progress Notes (Signed)
Pt was started on Eliquis 5mg  twice a day for AFIB on 06/01/18 while in the hospital and managed by Dr. Johnsie Cancel and Dr. Curt Bears.    Reviewed patients medication list.  Pt is not currently on any combined P-gp and strong CYP3A4 inhibitors/inducers (ketoconazole, traconazole, ritonavir, carbamazepine, phenytoin, rifampin, St. John's wort).  Reviewed labs: SCr-0.80, Hgb-14.8, HCT-43.2, Weight-254.12 lbs (115.5kg).  Dose appropriate based on age, weight, and SCr.  Hgb and HCT within normal limits.   A full discussion of the nature of anticoagulants has been carried out.  A benefit/risk analysis has been presented to the patient, so that they understand the justification for choosing anticoagulation with Eliquis at this time.  The need for compliance is stressed.  Pt is aware to take the medication twice daily.  Side effects of potential bleeding are discussed, including unusual colored urine or stools, coughing up blood or coffee ground emesis, nose bleeds or serious fall or head trauma.  Discussed signs and symptoms of stroke. The patient should avoid any OTC items containing aspirin or ibuprofen.  Avoid alcohol consumption.   Call if any signs of abnormal bleeding.  Discussed financial obligations and resolved any difficulty in obtaining medication.     Pt states her platelet count usually runs low and her PCP is aware of this. Labs reviewed and per MD recommendation pt can follow up with PCP for Calcium lab. Pt will continue Eliquis 5mg  twice a day and have labs done annually or as needed per MD.

## 2018-07-10 LAB — CBC
HEMATOCRIT: 43.2 % (ref 34.0–46.6)
HEMOGLOBIN: 14.8 g/dL (ref 11.1–15.9)
MCH: 31.1 pg (ref 26.6–33.0)
MCHC: 34.3 g/dL (ref 31.5–35.7)
MCV: 91 fL (ref 79–97)
Platelets: 161 10*3/uL (ref 150–450)
RBC: 4.76 x10E6/uL (ref 3.77–5.28)
RDW: 12.5 % (ref 12.3–15.4)
WBC: 8.9 10*3/uL (ref 3.4–10.8)

## 2018-07-10 LAB — BASIC METABOLIC PANEL
BUN / CREAT RATIO: 28 — AB (ref 9–23)
BUN: 22 mg/dL (ref 6–24)
CHLORIDE: 102 mmol/L (ref 96–106)
CO2: 24 mmol/L (ref 20–29)
Calcium: 10.4 mg/dL — ABNORMAL HIGH (ref 8.7–10.2)
Creatinine, Ser: 0.8 mg/dL (ref 0.57–1.00)
GFR calc Af Amer: 95 mL/min/{1.73_m2} (ref >59)
GFR calc non Af Amer: 83 mL/min/{1.73_m2} (ref >59)
GLUCOSE: 117 mg/dL — AB (ref 65–99)
Potassium: 4.3 mmol/L (ref 3.5–5.2)
SODIUM: 143 mmol/L (ref 134–144)

## 2018-07-16 ENCOUNTER — Telehealth: Payer: Self-pay

## 2018-07-16 DIAGNOSIS — R9431 Abnormal electrocardiogram [ECG] [EKG]: Secondary | ICD-10-CM

## 2018-07-16 NOTE — Telephone Encounter (Signed)
-----   Message from Josue Hector, MD sent at 07/15/2018  8:18 PM EDT ----- F/U with [facility] He started her on Multaq for PAF but has conversion pauses and may need pacing. Given arrhythmia cancel cardiac CT and order lexiscan myovue to r/o CAD

## 2018-07-16 NOTE — Telephone Encounter (Signed)
Called patient back about her event monitor. Per Dr. Johnsie Cancel, F/U with [facility] He started her on Multaq for PAF but has conversion pauses and may need pacing. Given arrhythmia cancel cardiac CT and order lexiscan myovue to r/o CAD. Will cancel patient's CT and order lexiscan. Will send message to Dr. Curt Bears scheduler to help with follow up.

## 2018-07-20 ENCOUNTER — Other Ambulatory Visit (HOSPITAL_COMMUNITY): Payer: Self-pay | Admitting: Orthopedic Surgery

## 2018-07-20 ENCOUNTER — Telehealth: Payer: Self-pay | Admitting: Nurse Practitioner

## 2018-07-20 NOTE — Telephone Encounter (Signed)
Received surgical clearance request for patient from Dr. Doran Durand for Left ankle syndesmosis ORIF; reconstruction of deltoid ligament scheduled for 10/17. I spoke with patient and asked her if Dr. Doran Durand was made aware of the current status of her cardiac health. Patient states he is aware and is requesting patient to hold Eliquis beginning today but that she has taken Eliquis this morning. Patient has new onset PAF and then had pauses that occurred while taking Multaq. Dr. Johnsie Cancel has ordered a lexiscan myoview to r/o CAD. Dr. Johnsie Cancel advised that he cannot clear patient for surgery until after her lexiscan.  I called Claiborne Billings, surgery scheduler at Dr. Nona Dell office and reviewed Dr. Kyla Balzarine advice with her. She verbalized understanding and states that anesthesia has already cancelled the procedure for Thursday. She thanked me for the call.

## 2018-07-20 NOTE — Progress Notes (Signed)
Patient's chart and all cardiac notes reviewed with Dr Kalman Shan and he states that patient will need to be done at [place]. Claiborne Billings at Dr [facility] office notified.

## 2018-07-20 NOTE — Telephone Encounter (Signed)
Patient has lexiscan scheduled for next week.

## 2018-07-22 ENCOUNTER — Ambulatory Visit (HOSPITAL_BASED_OUTPATIENT_CLINIC_OR_DEPARTMENT_OTHER): Admit: 2018-07-22 | Payer: BC Managed Care – PPO | Admitting: Orthopedic Surgery

## 2018-07-22 ENCOUNTER — Encounter (HOSPITAL_BASED_OUTPATIENT_CLINIC_OR_DEPARTMENT_OTHER): Payer: Self-pay

## 2018-07-22 ENCOUNTER — Telehealth (HOSPITAL_COMMUNITY): Payer: Self-pay

## 2018-07-22 SURGERY — OPEN REDUCTION INTERNAL FIXATION (ORIF) ANKLE FRACTURE
Anesthesia: General | Laterality: Left

## 2018-07-22 NOTE — Telephone Encounter (Signed)
Encounter complete. 

## 2018-07-27 ENCOUNTER — Ambulatory Visit (HOSPITAL_COMMUNITY)
Admission: RE | Admit: 2018-07-27 | Discharge: 2018-07-27 | Disposition: A | Discharge status: Home / Self Care | Payer: BC Managed Care – PPO | Source: Ambulatory Visit | Attending: Cardiology | Admitting: Cardiology

## 2018-07-27 DIAGNOSIS — R9431 Abnormal electrocardiogram [ECG] [EKG]: Secondary | ICD-10-CM

## 2018-07-27 MED ORDER — TECHNETIUM TC 99M TETROFOSMIN IV KIT
27.2000 | PACK | Freq: Once | INTRAVENOUS | Status: AC | PRN
  Administered 2018-07-27: 27.2 via INTRAVENOUS
  Filled 2018-07-27: qty 28

## 2018-07-27 MED ORDER — REGADENOSON 0.4 MG/5ML IV SOLN
0.4000 mg | Freq: Once | INTRAVENOUS | Status: AC
  Administered 2018-07-27: 0.4 mg via INTRAVENOUS

## 2018-07-28 ENCOUNTER — Ambulatory Visit (HOSPITAL_COMMUNITY)
Admission: RE | Admit: 2018-07-28 | Discharge: 2018-07-28 | Disposition: A | Discharge status: Home / Self Care | Payer: BC Managed Care – PPO | Source: Ambulatory Visit | Attending: Internal Medicine | Admitting: Internal Medicine

## 2018-07-28 LAB — MYOCARDIAL PERFUSION IMAGING
CHL CUP RESTING HR STRESS: 58 {beats}/min
LV sys vol: 107 mL
LVDIAVOL: 190 mL (ref 46–106)
NUC STRESS TID: 0.96
Peak HR: 67 {beats}/min
SDS: 12
SRS: 7
SSS: 19

## 2018-07-28 MED ORDER — TECHNETIUM TC 99M TETROFOSMIN IV KIT
30.1000 | PACK | Freq: Once | INTRAVENOUS | Status: AC | PRN
  Administered 2018-07-28: 30.1 via INTRAVENOUS

## 2018-07-29 ENCOUNTER — Encounter: Payer: Self-pay | Admitting: Cardiology

## 2018-07-29 ENCOUNTER — Ambulatory Visit: Payer: BC Managed Care – PPO | Admitting: Cardiology

## 2018-07-29 VITALS — BP 122/56 | HR 71 | Ht 69.0 in | Wt 273.0 lb

## 2018-07-29 DIAGNOSIS — I480 Paroxysmal atrial fibrillation: Secondary | ICD-10-CM

## 2018-07-29 DIAGNOSIS — I495 Sick sinus syndrome: Secondary | ICD-10-CM | POA: Diagnosis not present

## 2018-07-29 DIAGNOSIS — I2510 Atherosclerotic heart disease of native coronary artery without angina pectoris: Secondary | ICD-10-CM

## 2018-07-29 NOTE — Progress Notes (Signed)
Electrophysiology Office Note   Date:  07/29/2018   ID:  [name], DOB 11-11-1960, MRN [id number]  PCP:  Aletha Halim., PA-C  Cardiologist:  Johnsie Cancel Primary Electrophysiologist:  Fallynn Gravett Meredith Leeds, MD    No chief complaint on file.    History of Present Illness: [name] is a 57 y.o. 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 Presenting today for electrophysiology evaluation.  She has a history of tobacco abuse, alcohol abuse, cirrhosis, B12 deficiency, aortic atherosclerosis, and coronary disease by CT scan, COPD, hypothyroidism, hyperlipidemia.  She presented to the hospital in August 2019 with a brown out episode.  The morning of the event, she woke up feeling well at 4 AM prior to going to work.  She felt more tired than usual.  She works in a [employer].  She was standing in line when she felt like a table that collapsed.  Her legs went underneath her.  She did not trip or lose her balance.  She nearly passed out but did not completely lose consciousness.  In the emergency room she was noted to be intermittently bradycardic into the mid 40s as well as tachycardic into the 120s which appeared to be atrial flutter/fibrillation.  She was noted to have atrial fibrillation and atrial flutter while she was hospitalized.  She had intermittent 3.4-second pauses.  She was put on IV amiodarone during her hospitalization.  She was started on Eliquis.  She was discharged without rhythm control.  Today, denies symptoms of palpitations, chest pain, shortness of breath, orthopnea, PND, lower extremity edema, claudication, dizziness, presyncope, syncope, bleeding, or neurologic sequela. The patient is tolerating medications without difficulties.  Overall feeling well.  She is unaware of her atrial fibrillation.  She wore a cardiac monitor that showed intermittent pauses of her heart rhythm.  9 weeks  ago, she did have a fall after almost blacking out.  She broke her right foot.   Past Medical History:  Diagnosis Date  . Alcohol abuse    a. quit several years ago.  . Allergy   . Anemia    younger  . Arthritis    feet,knees  . B12 deficiency   . BPPV (benign paroxysmal positional vertigo)   . Cirrhosis ([place])   . Complication of anesthesia    "slow to wake"  . Constipation    on movantik- stools regular and soft on this  . Foot drop   . Hyperlipidemia   . Hypothyroidism   . Neuromuscular disorder ([facility])    idiopathic neuropathy  . Spinal stenosis   . Tobacco abuse    Past Surgical History:  Procedure Laterality Date  . ANKLE SURGERY    . CERVICAL CONE BIOPSY    . CESAREAN SECTION    . COLONOSCOPY    . HERNIA REPAIR    . KNEE SURGERY     x5  . ORIF ANKLE FRACTURE Left 06/02/2018   Procedure: OPEN REDUCTION INTERNAL FIXATION (ORIF) ANKLE FRACTURE;  Surgeon: Wylene Simmer, MD;  Location: [facility];  Service: Orthopedics;  Laterality: Left;  . POLYPECTOMY    . SPINAL CORD STIMULATOR INSERTION N/A 05/07/2016   Procedure: LUMBAR SPINAL CORD STIMULATOR INSERTION;  Surgeon: Melina Schools, MD;  Location: [facility];  Service: Orthopedics;  Laterality: N/A;     Current Outpatient Medications  Medication Sig Dispense Refill  . albuterol (PROVENTIL HFA;VENTOLIN HFA) 108 (90 Base) MCG/ACT inhaler  Inhale 2 puffs into the lungs every 6 (six) hours as needed for wheezing or shortness of breath.     Marland Kitchen apixaban (ELIQUIS) 5 MG TABS tablet Take 1 tablet (5 mg total) by mouth 2 (two) times daily. 180 tablet 3  . BREO ELLIPTA 200-25 MCG/INH AEPB Inhale 1 puff into the lungs daily.  11  . docusate sodium (COLACE) 100 MG capsule Take 1 capsule (100 mg total) by mouth 2 (two) times daily. While taking narcotic pain medicine. 30 capsule 0  . dronedarone (MULTAQ) 400 MG tablet Take 1 tablet (400 mg total) by mouth 2 (two) times daily with a meal. 60 tablet 6  . famotidine (PEPCID) 20 MG tablet Take 20 mg  by mouth 3 (three) times daily.  0  . fluticasone (FLONASE) 50 MCG/ACT nasal spray Place 2 sprays into both nostrils daily as needed for allergies (seasonal allergies).   11  . folic acid (FOLVITE) 1 MG tablet Take 1 mg by mouth daily.     . hydrochlorothiazide (HYDRODIURIL) 25 MG tablet Take 25 mg by mouth daily.  1  . ibuprofen (ADVIL,MOTRIN) 800 MG tablet Take 800 mg by mouth every 8 (eight) hours as needed for mild pain or moderate pain.    Marland Kitchen L-Methylfolate-Algae-B12-B6 (METANX) 3-90.314-2-35 MG CAPS Take 1 capsule by mouth 2 (two) times daily.  5  . levothyroxine (SYNTHROID) 25 MCG tablet Take 25 mcg by mouth daily before breakfast.     . LINZESS 145 MCG CAPS capsule Take 145 mcg by mouth 2 (two) times daily as needed (IBS symptoms).     . naproxen (NAPROSYN) 250 MG tablet Take 1 tablet (250 mg total) by mouth 2 (two) times daily with a meal. 10 tablet 0  . oxyCODONE-acetaminophen (PERCOCET) 10-325 MG tablet Take 1 tablet by mouth every 4 (four) hours as needed for pain. (Patient taking differently: Take 1 tablet by mouth every 6 (six) hours as needed for pain. ) 60 tablet 0  . PAZEO 0.7 % SOLN Place 1 drop into both eyes daily as needed (dry eyes).   3  . pregabalin (LYRICA) 150 MG capsule Take 150 mg by mouth 3 (three) times daily.     . rosuvastatin (CRESTOR) 10 MG tablet Take 10 mg by mouth at bedtime.     . senna (SENOKOT) 8.6 MG TABS tablet Take 2 tablets (17.2 mg total) by mouth 2 (two) times daily. 30 each 0  . Vitamin D, Ergocalciferol, (DRISDOL) 50000 units CAPS capsule Take 50,000 Units by mouth every 7 (seven) days. Wednesday     No current facility-administered medications for this visit.     Allergies:   Vancomycin and Sulfamethoxazole   Social History:  The patient  reports that she has been smoking cigarettes. She has a 30.00 pack-year smoking history. She has never used smokeless tobacco. She reports that she drank alcohol. She reports that she does not use drugs.    Family History:  The patient's She was adopted. Family history is unknown by patient.    ROS:  Please see the history of present illness.   Otherwise, review of systems is positive for back pain, balance problems.   All other systems are reviewed and negative.   PHYSICAL EXAM: VS:  BP (!) 122/56   Pulse 71   Ht 5\' 9"  (1.753 m)   Wt 273 lb (123.8 kg)   LMP  (LMP Unknown) Comment: Pt under anesthesia in OR  BMI 40.32 kg/m  , BMI Body mass index  is 40.32 kg/m. GEN: Well nourished, well developed, in no acute distress  HEENT: normal  Neck: no JVD, carotid bruits, or masses Cardiac: RRR; no murmurs, rubs, or gallops,no edema  Respiratory:  clear to auscultation bilaterally, normal work of breathing GI: soft, nontender, nondistended, + BS MS: no deformity or atrophy  Skin: warm and dry Neuro:  Strength and sensation are intact Psych: euthymic mood, full affect  EKG:  EKG is ordered today. Personal review of the ekg ordered shows this rhythm, left bundle branch block  Recent Labs: 06/02/2018: TSH 1.225 06/03/2018: ALT 16; Magnesium 1.9 07/09/2018: BUN 22; Creatinine, Ser 0.80; Hemoglobin 14.8; Platelets 161; Potassium 4.3; Sodium 143    Lipid Panel     Component Value Date/Time   CHOL 119 06/02/2018 0325   TRIG 104 06/02/2018 0325   HDL 39 (L) 06/02/2018 0325   CHOLHDL 3.1 06/02/2018 0325   VLDL 21 06/02/2018 0325   LDLCALC 59 06/02/2018 0325     Wt Readings from Last 3 Encounters:  07/29/18 273 lb (123.8 kg)  07/27/18 274 lb (124.3 kg)  06/25/18 274 lb (124.3 kg)      Other studies Reviewed: Additional studies/ records that were reviewed today include: TTE 06/01/18  Review of the above records today demonstrates:  - Left ventricle: Mild diffuse hypo kinesis with abnormal septal   motion likely from LBBB. The cavity size was mildly dilated. Wall   thickness was normal. Systolic function was mildly reduced. The   estimated ejection fraction was in the range of 45%  to 50%. Left   ventricular diastolic function parameters were normal. - Mitral valve: Calcified annulus. Mildly thickened leaflets . - Left atrium: The atrium was moderately dilated. - Right ventricle: The cavity size was mildly dilated. - Right atrium: The atrium was moderately dilated. - Atrial septum: No defect or patent foramen ovale was identified.  Myoview 07/29/18  The left ventricular ejection fraction is moderately decreased (30-44%).  Nuclear stress EF is calculated at 43% but appears better visually. There is paradoxical septal motion due to LBBB  There was no ST segment deviation noted during stress.  There is a medium defect of moderate severity present in the mid anteroseptal, apical anterior, apical septal and apex location. The defect is non-reversible and likely related to breast attenuation artifact. No ischemia noted.  This is a low risk study.  30 day monitor 07/15/18 - personally reviewed NSR PAF rapid rates 160 Conversion pauses greater than 3 seconds  ASSESSMENT AND PLAN:  1.  Paroxysmal atrial fibrillation: Currently on Eliquis.  She is feeling well.  She does not aware of her atrial fibrillation.  She wore a cardiac monitor that showed multiple long pauses which were postconversion.  She also had a brown out in which she broke her left ankle.  At this point, I feel that pacemaker implant would be beneficial.  Risks and benefits were discussed include bleeding, tamponade, infection, pneumothorax.  She understands these risks and is agreed to the procedure.   This patients CHA2DS2-VASc Score and unadjusted Ischemic Stroke Rate (% per year) is equal to 2.2 % stroke rate/year from a score of 2  Above score calculated as 1 point each if present [CHF, HTN, DM, Vascular=MI/PAD/Aortic Plaque, Age if 65-74, or Female] Above score calculated as 2 points each if present [Age > 75, or Stroke/TIA/TE]   2.  Systolic heart failure: Ejection fraction 45 to 50%.  No  changes.  3.  Coronary atherosclerosis: Found on CT scan 05/2018.  No chest pain.  Continue statin.  Order a Myoview.  Current medicines are reviewed at length with the patient today.   The patient does not have concerns regarding her medicines.  The following changes were made today: none  Labs/ tests ordered today include:  Orders Placed This Encounter  Procedures  . EKG 12-Lead   Case discussed with primary cardiology  Disposition:   FU with Kiona Blume 3 months  Signed, Dailen Mcclish Meredith Leeds, MD  07/29/2018 4:38 PM     [address] [place] [address] [phone] (office) [phone] (fax)

## 2018-07-29 NOTE — Patient Instructions (Addendum)
Medication Instructions:  Your physician recommends that you continue on your current medications as directed. Please refer to the Current Medication list given to you today.  If you need a refill on your cardiac medications before your next appointment, please call your pharmacy.   Lab work: Pre procedure labs today: BMET & CBC If you have labs (blood work) drawn today and your tests are completely normal, you will receive your results only by: Marland Kitchen MyChart Message (if you have MyChart) OR . A paper copy in the mail If you have any lab test that is abnormal or we need to change your treatment, we will call you to review the results.  Testing/Procedures: Your physician has recommended that you have a pacemaker inserted. A pacemaker is a small device that is placed under the skin of your chest or abdomen to help control abnormal heart rhythms. This device uses electrical pulses to prompt the heart to beat at a normal rate. Pacemakers are used to treat heart rhythms that are too slow. Wire (leads) are attached to the pacemaker that goes into the chambers of you heart. This is done in the hospital and usually requires and overnight stay. Please see the instructions below located under special instruction section.  Follow-Up: Your physician recommends that you schedule a follow-up appointment in: 10-14 days, after your procedure on 08/05/18, with device clinic four a wound check.  Your physician recommends that you schedule a follow-up appointment in: 3 months, after your procedure on 08/05/2018, with Dr. Curt Bears.  Thank you for choosing [facility]!!   Trinidad Curet, RN [phone]   Any Other Special Instructions Will Be Listed Below (If Applicable).  Implantable Device Instructions  You are scheduled for:                  _____ Permanent Transvenous Pacemaker  on  08/05/2018  with Dr. Curt Bears.  1.   Please arrive at the [facility], Entrance "A"  at [facility] at  10:30  a.m. on  the day of your procedure. (The address is [address])  2. Do not eat or drink after midnight the night before your procedure.  3.   You will pre procedure lab work at the hospital the morning of this procedure.  4.    Hold all of your morning medications the morning of your procedure.  5.  Plan for an overnight stay.  Bring your insurance cards and a list of you medications.  6.  Wash your chest and neck with surgical scrub the evening before and the morning of      your procedure.  Rinse well. Please review the surgical scrub instruction sheet given       to you.  7. Your chest will need to be shaved prior to this procedure (if needed). We ask that you do this yourself at home 1 to 2 days before or if uncomfortable/unable to do yourself, then it will be performed by the hospital staff the day of.                                                                                                                *  If you have ANY questions after you get home, please call Trinidad Curet, RN @ [phone].  * Every attempt is made to prevent procedures from being rescheduled.  Due to the nature of  Electrophysiology, rescheduling can happen.  The physician is always aware and directs the staff when this occurs.      Pacemaker Implantation, Adult Pacemaker implantation is a procedure to place a pacemaker inside your chest. A pacemaker is a small computer that sends electrical signals to the heart and helps your heart beat normally. A pacemaker also stores information about your heart rhythms. You may need pacemaker implantation if you:  Have a slow heartbeat (bradycardia).  Faint (syncope).  Have shortness of breath (dyspnea) due to heart problems.  The pacemaker attaches to your heart through a wire, called a lead. Sometimes just one lead is needed. Other times, there will be two leads. There are two types of pacemakers:  Transvenous pacemaker. This type is placed  under the skin or muscle of your chest. The lead goes through a vein in the chest area to reach the inside of the heart.  Epicardial pacemaker. This type is placed under the skin or muscle of your chest or belly. The lead goes through your chest to the outside of the heart.  Tell a health care provider about:  Any allergies you have.  All medicines you are taking, including vitamins, herbs, eye drops, creams, and over-the-counter medicines.  Any problems you or family members have had with anesthetic medicines.  Any blood or bone disorders you have.  Any surgeries you have had.  Any medical conditions you have.  Whether you are pregnant or may be pregnant. What are the risks? Generally, this is a safe procedure. However, problems may occur, including:  Infection.  Bleeding.  Failure of the pacemaker or the lead.  Collapse of a lung or bleeding into a lung.  Blood clot inside a blood vessel with a lead.  Damage to the heart.  Infection inside the heart (endocarditis).  Allergic reactions to medicines.  What happens before the procedure? Staying hydrated Follow instructions from your health care provider about hydration, which may include:  Up to 2 hours before the procedure - you may continue to drink clear liquids, such as water, clear fruit juice, black coffee, and plain tea.  Eating and drinking restrictions Follow instructions from your health care provider about eating and drinking, which may include:  8 hours before the procedure - stop eating heavy meals or foods such as meat, fried foods, or fatty foods.  6 hours before the procedure - stop eating light meals or foods, such as toast or cereal.  6 hours before the procedure - stop drinking milk or drinks that contain milk.  2 hours before the procedure - stop drinking clear liquids.  Medicines  Ask your health care provider about: ? Changing or stopping your regular medicines. This is especially  important if you are taking diabetes medicines or blood thinners. ? Taking medicines such as aspirin and ibuprofen. These medicines can thin your blood. Do not take these medicines before your procedure if your health care provider instructs you not to.  You may be given antibiotic medicine to help prevent infection. General instructions  You will have a heart evaluation. This may include an electrocardiogram (ECG), chest X-ray, and heart imaging (echocardiogram,  or echo) tests.  You will have blood tests.  Do not use any products that contain nicotine or tobacco, such as cigarettes  and e-cigarettes. If you need help quitting, ask your health care provider.  Plan to have someone take you home from the hospital or clinic.  If you will be going home right after the procedure, plan to have someone with you for 24 hours.  Ask your health care provider how your surgical site will be marked or identified. What happens during the procedure?  To reduce your risk of infection: ? Your health care team will wash or sanitize their hands. ? Your skin will be washed with soap. ? Hair may be removed from the surgical area.  An IV tube will be inserted into one of your veins.  You will be given one or more of the following: ? A medicine to help you relax (sedative). ? A medicine to numb the area (local anesthetic). ? A medicine to make you fall asleep (general anesthetic).  If you are getting a transvenous pacemaker: ? An incision will be made in your upper chest. ? A pocket will be made for the pacemaker. It may be placed under the skin or between layers of muscle. ? The lead will be inserted into a blood vessel that returns to the heart. ? While X-rays are taken by an imaging machine (fluoroscopy), the lead will be advanced through the vein to the inside of your heart. ? The other end of the lead will be tunneled under the skin and attached to the pacemaker.  If you are getting an epicardial  pacemaker: ? An incision will be made near your ribs or breastbone (sternum) for the lead. ? The lead will be attached to the outside of your heart. ? Another incision will be made in your chest or upper belly to create a pocket for the pacemaker. ? The free end of the lead will be tunneled under the skin and attached to the pacemaker.  The transvenous or epicardial pacemaker will be tested. Imaging studies may be done to check the lead position.  The incisions will be closed with stitches (sutures), adhesive strips, or skin glue.  Bandages (dressing) will be placed over the incisions. The procedure may vary among health care providers and hospitals. What happens after the procedure?  Your blood pressure, heart rate, breathing rate, and blood oxygen level will be monitored until the medicines you were given have worn off.  You will be given antibiotics and pain medicine.  ECG and chest x-rays will be done.  You will wear a continuous type of ECG (Holter monitor) to check your heart rhythm.  Your health care provider willprogram the pacemaker.  Do not drive for 24 hours if you received a sedative. This information is not intended to replace advice given to you by your health care provider. Make sure you discuss any questions you have with your health care provider. Document Released: 09/12/2002 Document Revised: 04/11/2016 Document Reviewed: 03/05/2016 Elsevier Interactive Patient Education  [employer].

## 2018-08-05 ENCOUNTER — Encounter (HOSPITAL_COMMUNITY): Payer: Self-pay

## 2018-08-05 ENCOUNTER — Ambulatory Visit (HOSPITAL_COMMUNITY)
Admission: RE | Admit: 2018-08-05 | Discharge: 2018-08-06 | Disposition: A | Discharge status: Home / Self Care | Payer: BC Managed Care – PPO | Source: Ambulatory Visit | Attending: Cardiology | Admitting: Cardiology

## 2018-08-05 ENCOUNTER — Other Ambulatory Visit: Payer: Self-pay

## 2018-08-05 ENCOUNTER — Encounter (HOSPITAL_COMMUNITY)
Admission: RE | Disposition: A | Discharge status: Home / Self Care | Payer: Self-pay | Source: Ambulatory Visit | Attending: Cardiology

## 2018-08-05 DIAGNOSIS — Z882 Allergy status to sulfonamides status: Secondary | ICD-10-CM | POA: Insufficient documentation

## 2018-08-05 DIAGNOSIS — I495 Sick sinus syndrome: Secondary | ICD-10-CM | POA: Diagnosis not present

## 2018-08-05 DIAGNOSIS — F1010 Alcohol abuse, uncomplicated: Secondary | ICD-10-CM | POA: Diagnosis not present

## 2018-08-05 DIAGNOSIS — K7460 Unspecified cirrhosis of liver: Secondary | ICD-10-CM | POA: Diagnosis not present

## 2018-08-05 DIAGNOSIS — I429 Cardiomyopathy, unspecified: Secondary | ICD-10-CM | POA: Insufficient documentation

## 2018-08-05 DIAGNOSIS — Z95818 Presence of other cardiac implants and grafts: Secondary | ICD-10-CM

## 2018-08-05 DIAGNOSIS — E538 Deficiency of other specified B group vitamins: Secondary | ICD-10-CM | POA: Diagnosis not present

## 2018-08-05 DIAGNOSIS — I480 Paroxysmal atrial fibrillation: Secondary | ICD-10-CM | POA: Diagnosis not present

## 2018-08-05 DIAGNOSIS — Z7901 Long term (current) use of anticoagulants: Secondary | ICD-10-CM | POA: Insufficient documentation

## 2018-08-05 DIAGNOSIS — J449 Chronic obstructive pulmonary disease, unspecified: Secondary | ICD-10-CM | POA: Insufficient documentation

## 2018-08-05 DIAGNOSIS — E785 Hyperlipidemia, unspecified: Secondary | ICD-10-CM | POA: Insufficient documentation

## 2018-08-05 DIAGNOSIS — Z950 Presence of cardiac pacemaker: Secondary | ICD-10-CM

## 2018-08-05 DIAGNOSIS — E039 Hypothyroidism, unspecified: Secondary | ICD-10-CM | POA: Insufficient documentation

## 2018-08-05 HISTORY — PX: PACEMAKER IMPLANT: EP1218

## 2018-08-05 HISTORY — DX: Presence of cardiac pacemaker: Z95.0

## 2018-08-05 LAB — SURGICAL PCR SCREEN
MRSA, PCR: NEGATIVE
Staphylococcus aureus: NEGATIVE

## 2018-08-05 SURGERY — PACEMAKER IMPLANT

## 2018-08-05 MED ORDER — DEXTROSE 5 % IV SOLN
3.0000 g | INTRAVENOUS | Status: AC
  Administered 2018-08-05: 3 g via INTRAVENOUS
  Filled 2018-08-05: qty 3000

## 2018-08-05 MED ORDER — OXYCODONE-ACETAMINOPHEN 10-325 MG PO TABS: 1.0000 | ORAL_TABLET | ORAL | Status: DC | PRN

## 2018-08-05 MED ORDER — ALBUTEROL SULFATE (2.5 MG/3ML) 0.083% IN NEBU: 2.5000 mg | INHALATION_SOLUTION | Freq: Four times a day (QID) | RESPIRATORY_TRACT | Status: DC | PRN

## 2018-08-05 MED ORDER — APIXABAN 5 MG PO TABS
5.0000 mg | ORAL_TABLET | Freq: Two times a day (BID) | ORAL | Status: DC
  Administered 2018-08-05: 5 mg via ORAL
  Filled 2018-08-05: qty 1

## 2018-08-05 MED ORDER — HYDROCHLOROTHIAZIDE 25 MG PO TABS
25.0000 mg | ORAL_TABLET | Freq: Every day | ORAL | Status: DC
  Filled 2018-08-05: qty 1

## 2018-08-05 MED ORDER — OLOPATADINE HCL 0.1 % OP SOLN
1.0000 [drp] | Freq: Every day | OPHTHALMIC | Status: DC | PRN
  Filled 2018-08-05: qty 5

## 2018-08-05 MED ORDER — IBUPROFEN 600 MG PO TABS
800.0000 mg | ORAL_TABLET | Freq: Three times a day (TID) | ORAL | Status: DC
  Administered 2018-08-05 (×2): 800 mg via ORAL
  Filled 2018-08-05 (×2): qty 1

## 2018-08-05 MED ORDER — IOPAMIDOL (ISOVUE-370) INJECTION 76%
INTRAVENOUS | Status: DC | PRN
  Administered 2018-08-05: 50 mL via INTRAVENOUS

## 2018-08-05 MED ORDER — FLUTICASONE PROPIONATE 50 MCG/ACT NA SUSP
2.0000 | Freq: Every day | NASAL | Status: DC | PRN
  Filled 2018-08-05: qty 16

## 2018-08-05 MED ORDER — FLUTICASONE FUROATE-VILANTEROL 200-25 MCG/INH IN AEPB
1.0000 | INHALATION_SPRAY | Freq: Every day | RESPIRATORY_TRACT | Status: DC
  Administered 2018-08-06: 1 via RESPIRATORY_TRACT
  Filled 2018-08-05: qty 28

## 2018-08-05 MED ORDER — LINACLOTIDE 145 MCG PO CAPS
145.0000 ug | ORAL_CAPSULE | Freq: Two times a day (BID) | ORAL | Status: DC
  Administered 2018-08-05: 145 ug via ORAL
  Filled 2018-08-05 (×2): qty 1

## 2018-08-05 MED ORDER — SODIUM CHLORIDE 0.9 % IV SOLN
INTRAVENOUS | Status: AC
  Filled 2018-08-05: qty 2

## 2018-08-05 MED ORDER — HEPARIN (PORCINE) IN NACL 1000-0.9 UT/500ML-% IV SOLN
INTRAVENOUS | Status: DC | PRN
  Administered 2018-08-05: 500 mL

## 2018-08-05 MED ORDER — OXYCODONE HCL 5 MG PO TABS
5.0000 mg | ORAL_TABLET | ORAL | Status: DC | PRN
  Administered 2018-08-06 (×2): 5 mg via ORAL
  Filled 2018-08-05 (×2): qty 1

## 2018-08-05 MED ORDER — FENTANYL CITRATE (PF) 100 MCG/2ML IJ SOLN
INTRAMUSCULAR | Status: AC
  Filled 2018-08-05: qty 2

## 2018-08-05 MED ORDER — VITAMIN D (ERGOCALCIFEROL) 1.25 MG (50000 UNIT) PO CAPS: 50000.0000 [IU] | ORAL_CAPSULE | ORAL | Status: DC

## 2018-08-05 MED ORDER — L-METHYLFOLATE-B6-B12 3-35-2 MG PO TABS
1.0000 | ORAL_TABLET | Freq: Every day | ORAL | Status: DC
  Administered 2018-08-05: 1 via ORAL
  Filled 2018-08-05 (×2): qty 1

## 2018-08-05 MED ORDER — SODIUM CHLORIDE 0.9 % IV SOLN
80.0000 mg | INTRAVENOUS | Status: AC
  Administered 2018-08-05: 80 mg

## 2018-08-05 MED ORDER — FOLIC ACID 1 MG PO TABS
1.0000 mg | ORAL_TABLET | Freq: Every day | ORAL | Status: DC
  Administered 2018-08-05: 1 mg via ORAL
  Filled 2018-08-05: qty 1

## 2018-08-05 MED ORDER — METANX 3-90.314-2-35 MG PO CAPS: 1.0000 | ORAL_CAPSULE | Freq: Two times a day (BID) | ORAL | Status: DC

## 2018-08-05 MED ORDER — CHLORHEXIDINE GLUCONATE 4 % EX LIQD
60.0000 mL | Freq: Once | CUTANEOUS | Status: DC
  Filled 2018-08-05: qty 750

## 2018-08-05 MED ORDER — MIDAZOLAM HCL 5 MG/5ML IJ SOLN
INTRAMUSCULAR | Status: DC | PRN
  Administered 2018-08-05 (×5): 1 mg via INTRAVENOUS

## 2018-08-05 MED ORDER — FENTANYL CITRATE (PF) 100 MCG/2ML IJ SOLN
INTRAMUSCULAR | Status: DC | PRN
  Administered 2018-08-05 (×2): 25 ug via INTRAVENOUS
  Administered 2018-08-05: 50 ug via INTRAVENOUS
  Administered 2018-08-05 (×2): 25 ug via INTRAVENOUS

## 2018-08-05 MED ORDER — SENNA 8.6 MG PO TABS: 2.0000 | ORAL_TABLET | Freq: Two times a day (BID) | ORAL | Status: DC | PRN

## 2018-08-05 MED ORDER — FAMOTIDINE 20 MG PO TABS
20.0000 mg | ORAL_TABLET | Freq: Three times a day (TID) | ORAL | Status: DC
  Administered 2018-08-05 (×2): 20 mg via ORAL
  Filled 2018-08-05 (×2): qty 1

## 2018-08-05 MED ORDER — ONDANSETRON HCL 4 MG/2ML IJ SOLN: 4.0000 mg | Freq: Four times a day (QID) | INTRAMUSCULAR | Status: DC | PRN

## 2018-08-05 MED ORDER — MUPIROCIN 2 % EX OINT
1.0000 "application " | TOPICAL_OINTMENT | Freq: Once | CUTANEOUS | Status: AC
  Administered 2018-08-05: 1 via TOPICAL

## 2018-08-05 MED ORDER — OXYCODONE-ACETAMINOPHEN 5-325 MG PO TABS
1.0000 | ORAL_TABLET | ORAL | Status: DC | PRN
  Filled 2018-08-05: qty 1

## 2018-08-05 MED ORDER — DOCUSATE SODIUM 100 MG PO CAPS: 100.0000 mg | ORAL_CAPSULE | Freq: Two times a day (BID) | ORAL | Status: DC | PRN

## 2018-08-05 MED ORDER — LEVOTHYROXINE SODIUM 25 MCG PO TABS
25.0000 ug | ORAL_TABLET | Freq: Every day | ORAL | Status: DC
  Administered 2018-08-06: 25 ug via ORAL
  Filled 2018-08-05: qty 1

## 2018-08-05 MED ORDER — MUPIROCIN 2 % EX OINT
TOPICAL_OINTMENT | CUTANEOUS | Status: AC
  Administered 2018-08-05: 1 via TOPICAL
  Filled 2018-08-05: qty 22

## 2018-08-05 MED ORDER — LIDOCAINE HCL (PF) 1 % IJ SOLN
INTRAMUSCULAR | Status: DC | PRN
  Administered 2018-08-05: 30 mL
  Administered 2018-08-05: 60 mL

## 2018-08-05 MED ORDER — PREGABALIN 75 MG PO CAPS
150.0000 mg | ORAL_CAPSULE | Freq: Three times a day (TID) | ORAL | Status: DC
  Administered 2018-08-05 (×2): 150 mg via ORAL
  Filled 2018-08-05 (×2): qty 2

## 2018-08-05 MED ORDER — ROSUVASTATIN CALCIUM 10 MG PO TABS
10.0000 mg | ORAL_TABLET | Freq: Every evening | ORAL | Status: DC
  Administered 2018-08-05: 10 mg via ORAL
  Filled 2018-08-05: qty 1

## 2018-08-05 MED ORDER — DRONEDARONE HCL 400 MG PO TABS
400.0000 mg | ORAL_TABLET | Freq: Two times a day (BID) | ORAL | Status: DC
  Administered 2018-08-05 – 2018-08-06 (×2): 400 mg via ORAL
  Filled 2018-08-05 (×2): qty 1

## 2018-08-05 MED ORDER — CEFAZOLIN SODIUM-DEXTROSE 1-4 GM/50ML-% IV SOLN
1.0000 g | Freq: Four times a day (QID) | INTRAVENOUS | Status: AC
  Administered 2018-08-05 – 2018-08-06 (×3): 1 g via INTRAVENOUS
  Filled 2018-08-05 (×3): qty 50

## 2018-08-05 MED ORDER — HEPARIN (PORCINE) IN NACL 1000-0.9 UT/500ML-% IV SOLN
INTRAVENOUS | Status: AC
  Filled 2018-08-05: qty 500

## 2018-08-05 MED ORDER — IOPAMIDOL (ISOVUE-370) INJECTION 76%
INTRAVENOUS | Status: AC
  Filled 2018-08-05: qty 50

## 2018-08-05 MED ORDER — MIDAZOLAM HCL 5 MG/5ML IJ SOLN
INTRAMUSCULAR | Status: AC
  Filled 2018-08-05: qty 5

## 2018-08-05 MED ORDER — ACETAMINOPHEN 325 MG PO TABS
325.0000 mg | ORAL_TABLET | ORAL | Status: DC | PRN
  Administered 2018-08-06: 650 mg via ORAL
  Filled 2018-08-05: qty 2

## 2018-08-05 MED ORDER — LIDOCAINE HCL (PF) 1 % IJ SOLN
INTRAMUSCULAR | Status: AC
  Filled 2018-08-05: qty 60

## 2018-08-05 MED ORDER — SODIUM CHLORIDE 0.9 % IV SOLN
INTRAVENOUS | Status: DC
  Administered 2018-08-05: 13:00:00 via INTRAVENOUS

## 2018-08-05 SURGICAL SUPPLY — 7 items
CABLE SURGICAL S-101-97-12 (CABLE) ×2 IMPLANT
LEAD TENDRIL MRI 52CM LPA1200M (Lead) ×1 IMPLANT
LEAD TENDRIL MRI 58CM LPA1200M (Lead) ×1 IMPLANT
PACEMAKER ASSURITY DR-RF (Pacemaker) ×1 IMPLANT
PAD PRO RADIOLUCENT 2001M-C (PAD) ×2 IMPLANT
SHEATH CLASSIC 8F (SHEATH) ×2 IMPLANT
TRAY PACEMAKER INSERTION (PACKS) ×2 IMPLANT

## 2018-08-05 NOTE — H&P (Signed)
[name] has presented today for surgery, with the diagnosis of tachy/brady syndrome.  The various methods of treatment have been discussed with the patient and family. After consideration of risks, benefits and other options for treatment, the patient has consented to  Procedure(s): Pacemaker implant as a surgical intervention .  Risks include but not limited to bleeding, tamponade, infection, pneumothorax, among others. The patient's history has been reviewed, patient examined, no change in status, stable for surgery.  I have reviewed the patient's chart and labs.  Questions were answered to the patient's satisfaction.    Abdurahman Rugg Curt Bears, MD 08/05/2018 12:06 PM

## 2018-08-05 NOTE — Discharge Instructions (Addendum)
Supplemental Discharge Instructions for  Pacemaker/Defibrillator Patients  Activity No heavy lifting or vigorous activity with your left/right arm for 6 to 8 weeks.  Do not raise your left/right arm above your head for one week.  Gradually raise your affected arm as drawn below.             08/09/18                     08/10/18                    08/11/18                    08/12/18 __  NO DRIVING until after your wound check visit.  WOUND CARE - Keep the wound area clean and dry.  Do not get this area wet, no showers until after your wound check visit . - The tape/steri-strips on your wound will fall off; do not pull them off.  No bandage is needed on the site.  DO  NOT apply any creams, oils, or ointments to the wound area. - If you notice any drainage or discharge from the wound, any swelling or bruising at the site, or you develop a fever > 101? F after you are discharged home, call the office at once.  Special Instructions - You are still able to use cellular telephones; use the ear opposite the side where you have your pacemaker/defibrillator.  Avoid carrying your cellular phone near your device. - When traveling through airports, show security personnel your identification card to avoid being screened in the metal detectors.  Ask the security personnel to use the hand wand. - Avoid arc welding equipment, MRI testing (magnetic resonance imaging), TENS units (transcutaneous nerve stimulators).  Call the office for questions about other devices. - Avoid electrical appliances that are in poor condition or are not properly grounded. - Microwave ovens are safe to be near or to operate.    Information on my medicine - ELIQUIS (apixaban)  This medication education was reviewed with me or my healthcare representative as part of my discharge preparation.   Why was Eliquis prescribed for you? Eliquis was prescribed for you to reduce the risk of forming blood clots that can cause a  stroke if you have a medical condition called atrial fibrillation (a type of irregular heartbeat) OR to reduce the risk of a blood clots forming after orthopedic surgery.  What do You need to know about Eliquis ? Take your Eliquis TWICE DAILY - one tablet in the morning and one tablet in the evening with or without food.  It would be best to take the doses about the same time each day.  If you have difficulty swallowing the tablet whole please discuss with your pharmacist how to take the medication safely.  Take Eliquis exactly as prescribed by your doctor and DO NOT stop taking Eliquis without talking to the doctor who prescribed the medication.  Stopping may increase your risk of developing a new clot or stroke.  Refill your prescription before you run out.  After discharge, you should have regular check-up appointments with your healthcare provider that is prescribing your Eliquis.  In the future your dose may need to be changed if your kidney function or weight changes by a significant amount or as you get older.  What do you do if you miss a dose? If you miss a dose, take it as soon as you  remember on the same day and resume taking twice daily.  Do not take more than one dose of ELIQUIS at the same time.  Important Safety Information A possible side effect of Eliquis is bleeding. You should call your healthcare provider right away if you experience any of the following: ? Bleeding from an injury or your nose that does not stop. ? Unusual colored urine (red or dark brown) or unusual colored stools (red or black). ? Unusual bruising for unknown reasons. ? A serious fall or if you hit your head (even if there is no bleeding).  Some medicines may interact with Eliquis and might increase your risk of bleeding or clotting while on Eliquis. To help avoid this, consult your healthcare provider or pharmacist prior to using any new prescription or non-prescription medications, including  herbals, vitamins, non-steroidal anti-inflammatory drugs (NSAIDs) and supplements.  This website has more information on Eliquis (apixaban): [email or web address].no.

## 2018-08-05 NOTE — Progress Notes (Signed)
Reviewed pt history with pt and husband at bedside, pt states she has a spinal stimulator in her back that was placed 2 years ago

## 2018-08-06 ENCOUNTER — Ambulatory Visit (HOSPITAL_COMMUNITY): Payer: BC Managed Care – PPO

## 2018-08-06 ENCOUNTER — Encounter (HOSPITAL_COMMUNITY): Payer: Self-pay | Admitting: Cardiology

## 2018-08-06 DIAGNOSIS — I495 Sick sinus syndrome: Secondary | ICD-10-CM | POA: Diagnosis not present

## 2018-08-06 DIAGNOSIS — J449 Chronic obstructive pulmonary disease, unspecified: Secondary | ICD-10-CM | POA: Diagnosis not present

## 2018-08-06 DIAGNOSIS — I480 Paroxysmal atrial fibrillation: Secondary | ICD-10-CM | POA: Diagnosis not present

## 2018-08-06 DIAGNOSIS — E039 Hypothyroidism, unspecified: Secondary | ICD-10-CM | POA: Diagnosis not present

## 2018-08-06 MED ORDER — SODIUM CHLORIDE 0.9 % IV SOLN
INTRAVENOUS | Status: DC | PRN
  Administered 2018-08-06: 250 mL via INTRAVENOUS

## 2018-08-06 NOTE — Telephone Encounter (Signed)
Spoke to patient about her site. Instructed patient not to use the ice pack over her site. I explained to her that some swelling is normal post procedure. I told patient that she needs to call if her site becomes larger, tighter, or begins to drain. Patient verbalized understanding and appreciation of information.

## 2018-08-06 NOTE — Discharge Summary (Addendum)
ELECTROPHYSIOLOGY PROCEDURE DISCHARGE SUMMARY    Patient ID: [name],  MRN: [id number], DOB/AGE: 56-Aug-1962 56 y.o.  Admit date: 08/05/2018 Discharge date: 08/06/2018  Primary Care Physician: Aletha Halim., PA-C  Primary Cardiologist: Dr. Johnsie Cancel Electrophysiologist: Dr. Curt Bears  Primary Discharge Diagnosis:  1. Tachy-brady syndrome 2. Paroxysmal AFib     CHA2DS2Vasc is 2, On Eliquis, appropriately dosed  Secondary Discharge Diagnosis:  1. COPD 2. Hypothyroidism 3. HLD 4. Tobacco abuse 5. ETOH abuse 6. Cirrhosis 7. B12 deficiency 8. CM  Allergies  Allergen Reactions  . Vancomycin Hives  . Sulfamethoxazole     GI Upset     Procedures This Admission:  1.  Implantation of a SJm dual chamber PPM on 08/05/18 by Dr Curt Bears.  The patient received [facility] Assurity MRI  model [id number] (serial number  [id number] ) pacemaker [place] [phone] (serial number  [id number]) right atrial lead and a [facility] Jude Medical model [id number] (serial number  [id number]) right ventricular lead There were no immediate post procedure complications. 2.  CXR on 08/06/18 demonstrated no pneumothorax status post device implantation.   Brief HPI: [name] is a 57 y.o. female was referred to electrophysiology in the outpatient setting for consideration of PPM implantation.  Past medical history includes above.  The patient has tachy-brady syndrome and recommended for PPM.  Risks, benefits, and alternatives to PPM implantation were reviewed with the patient who wished to proceed.   Hospital Course:  The patient was admitted and underwent implantation of a PPM with details as outlined above. She  was monitored on telemetry overnight which demonstrated AV pacing.  Left chest was without hematoma or ecchymosis.  The device was interrogated and found to be functioning normally.  CXR was obtained and demonstrated no pneumothorax status post device implantation.  Wound care, arm  mobility, and restrictions were reviewed with the patient.  The patient feels well  This morning, no CP or SOB, denies any site discomfort, she was examined by Dr. Curt Bears and considered stable for discharge to home.    Physical Exam: Vitals:   08/05/18 1631 08/05/18 2116 08/06/18 0648 08/06/18 0650  BP: 103/62 111/64  124/67  Pulse: (!) 59 60  60  Resp:  18  18  Temp:  98.7 F (37.1 C)  97.7 F (36.5 C)  TempSrc:  Oral  Oral  SpO2:  96%  97%  Weight:   115 kg   Height:        GEN- The patient is well appearing, alert and oriented x 3 today.   HEENT: normocephalic, atraumatic; sclera clear, conjunctiva pink; hearing intact; oropharynx clear; neck supple, no JVP Lungs- CTA b/l, normal work of breathing.  No wheezes, rales, rhonchi Heart- RRR, no murmurs, rubs or gallops, PMI not laterally displaced GI- soft, non-tender, non-distended Extremities- no clubbing, cyanosis, or edema; L orthopedic boot MS- no significant deformity or atrophy Skin- warm and dry, no rash or lesion, left chest without hematoma/ecchymosis Psych- euthymic mood, full affect Neuro- no gross deficits   Labs:   Lab Results  Component Value Date   WBC 8.9 07/09/2018   HGB 14.8 07/09/2018   HCT 43.2 07/09/2018   MCV 91 07/09/2018   PLT 161 07/09/2018   No results for input(s): NA, K, CL, CO2, BUN, CREATININE, CALCIUM, PROT, BILITOT, ALKPHOS, ALT, AST, GLUCOSE in the last 168 hours.  Invalid input(s): LABALBU  Discharge Medications:  Allergies as of 08/06/2018  Reactions   Vancomycin Hives   Sulfamethoxazole    GI Upset      Medication List    TAKE these medications   albuterol 108 (90 Base) MCG/ACT inhaler Commonly known as:  PROVENTIL HFA;VENTOLIN HFA Inhale 2 puffs into the lungs every 6 (six) hours as needed for wheezing or shortness of breath.   apixaban 5 MG Tabs tablet Commonly known as:  ELIQUIS Take 1 tablet (5 mg total) by mouth 2 (two) times daily.   BREO ELLIPTA 200-25  MCG/INH Aepb Generic drug:  fluticasone furoate-vilanterol Inhale 1 puff into the lungs daily.   docusate sodium 100 MG capsule Commonly known as:  COLACE Take 1 capsule (100 mg total) by mouth 2 (two) times daily. While taking narcotic pain medicine. What changed:    when to take this  reasons to take this   dronedarone 400 MG tablet Commonly known as:  MULTAQ Take 1 tablet (400 mg total) by mouth 2 (two) times daily with a meal.   famotidine 20 MG tablet Commonly known as:  PEPCID Take 20 mg by mouth 3 (three) times daily.   fluticasone 50 MCG/ACT nasal spray Commonly known as:  FLONASE Place 2 sprays into both nostrils daily as needed for allergies (seasonal allergies).   folic acid 1 MG tablet Commonly known as:  FOLVITE Take 1 mg by mouth daily.   hydrochlorothiazide 25 MG tablet Commonly known as:  HYDRODIURIL Take 25 mg by mouth daily.   ibuprofen 800 MG tablet Commonly known as:  ADVIL,MOTRIN Take 800 mg by mouth 3 (three) times daily.   LINZESS 145 MCG Caps capsule Generic drug:  linaclotide Take 145 mcg by mouth 2 (two) times daily.   LYRICA 150 MG capsule Generic drug:  pregabalin Take 150 mg by mouth 3 (three) times daily.   METANX 3-90.314-2-35 MG Caps Take 1 capsule by mouth 2 (two) times daily.   naproxen 250 MG tablet Commonly known as:  NAPROSYN Take 1 tablet (250 mg total) by mouth 2 (two) times daily with a meal.   oxyCODONE-acetaminophen 10-325 MG tablet Commonly known as:  PERCOCET Take 1 tablet by mouth every 4 (four) hours as needed for pain.   PAZEO 0.7 % Soln Generic drug:  Olopatadine HCl Place 1 drop into both eyes daily as needed (allergies).   rosuvastatin 10 MG tablet Commonly known as:  CRESTOR Take 10 mg by mouth every evening.   senna 8.6 MG Tabs tablet Commonly known as:  SENOKOT Take 2 tablets (17.2 mg total) by mouth 2 (two) times daily. What changed:    when to take this  reasons to take this   SYNTHROID 25  MCG tablet Generic drug:  levothyroxine Take 25 mcg by mouth daily before breakfast.   Vitamin D (Ergocalciferol) 50000 units Caps capsule Commonly known as:  DRISDOL Take 50,000 Units by mouth every Wednesday.       Disposition: Home Discharge Instructions    Diet - low sodium heart healthy   Complete by:  As directed    Increase activity slowly   Complete by:  As directed      Follow-up Information    [facility] Follow up on 08/19/2018.   Specialty:  Cardiology Why:  4:00PM, wound check visit Contact information: [address], [address] [place]       Constance Haw, MD Follow up on 11/05/2018.   Specialty:  Cardiology Why:  3:45PM Contact information: [address] [place] [address] [phone]        Josue Hector, MD Follow up on 09/16/2018.   Specialty:  Cardiology Why:  4:15PM Contact information: [address] [place] [place] [address] [phone]           Duration of Discharge Encounter: Greater than 30 minutes including physician time.  SignedTommye Standard, PA-C 08/06/2018 9:44 AM  I have seen and examined this patient with Tommye Standard.  Agree with above, note added to reflect my findings.  On exam, RRR, no murmurs, lungs clear.  Patient mid to the hospital for pacemaker implant for tachybradycardia syndrome.  Device interrogation and chest x-ray without major abnormality.  Plan for discharge today with follow-up in device clinic.  Trinity Haun M. Khylan Sawyer MD 08/06/2018 10:55 AM

## 2018-08-06 NOTE — Progress Notes (Signed)
Discharge  Pt has been able to participate with discharge teaching with spouse at bedside. Pt was able to dress independently once tele and PIV were removed. Pt awaiting wheelchair. Pt informed of all followup appts.

## 2018-08-09 NOTE — Telephone Encounter (Signed)
Spoke with pt she stated that the pacemaker site was not red, swollen or draining, pt denied rash at pacemaker site. Pt stated that there was not a rash around waistline and back. Advised pt to keep taking benadryl for the itching and to call back if any of the above symptomns start at her pacemaker site. Pt voiced understanding

## 2018-08-19 ENCOUNTER — Ambulatory Visit (INDEPENDENT_AMBULATORY_CARE_PROVIDER_SITE_OTHER): Payer: BC Managed Care – PPO | Admitting: *Deleted

## 2018-08-19 DIAGNOSIS — I483 Typical atrial flutter: Secondary | ICD-10-CM | POA: Diagnosis not present

## 2018-08-19 DIAGNOSIS — R001 Bradycardia, unspecified: Secondary | ICD-10-CM | POA: Diagnosis not present

## 2018-08-19 LAB — CUP PACEART INCLINIC DEVICE CHECK
Battery Remaining Longevity: 128 mo
Battery Voltage: 3.1 V
Brady Statistic RV Percent Paced: 2.9 %
Date Time Interrogation Session: 20191114170350
Implantable Lead Implant Date: 20191031
Implantable Lead Implant Date: 20191031
Implantable Lead Location: 753860
Implantable Pulse Generator Implant Date: 20191031
Lead Channel Impedance Value: 562.5 Ohm
Lead Channel Pacing Threshold Amplitude: 0.5 V
Lead Channel Pacing Threshold Amplitude: 0.5 V
Lead Channel Pacing Threshold Pulse Width: 0.5 ms
Lead Channel Setting Pacing Amplitude: 0.625
Lead Channel Setting Pacing Amplitude: 1.375
Lead Channel Setting Pacing Pulse Width: 0.5 ms
Lead Channel Setting Sensing Sensitivity: 2 mV
MDC IDC LEAD LOCATION: 753859
MDC IDC MSMT LEADCHNL RA SENSING INTR AMPL: 5 mV
MDC IDC MSMT LEADCHNL RV IMPEDANCE VALUE: 662.5 Ohm
MDC IDC MSMT LEADCHNL RV PACING THRESHOLD PULSEWIDTH: 0.5 ms
MDC IDC MSMT LEADCHNL RV SENSING INTR AMPL: 12 mV
MDC IDC PG SERIAL: 9079034
MDC IDC STAT BRADY RA PERCENT PACED: 91 %
Pulse Gen Model: 2272

## 2018-08-19 NOTE — Progress Notes (Signed)
Wound check appointment. Steri-strips removed. Wound without redness or edema. Incision edges approximated, wound well healed. Normal device function. Thresholds, sensing, and impedances consistent with implant measurements. Device programmed at 3.5V for extra safety margin until 3 month visit. Histogram distribution appropriate for patient and level of activity. 143 mode switches (4.8%)+Eliquis-- longest 1 hour 23 minutes, Avg V rate well controlled. No high ventricular rates noted. Patient educated about wound care, arm mobility, lifting restrictions. ROV with 11/05/2018

## 2018-08-20 ENCOUNTER — Telehealth: Payer: Self-pay | Admitting: *Deleted

## 2018-08-20 ENCOUNTER — Telehealth: Payer: Self-pay

## 2018-08-20 NOTE — Telephone Encounter (Signed)
Attempted to reach pt.  She is currently unavailable to speak.  Made aware I would call back later.

## 2018-08-20 NOTE — Telephone Encounter (Signed)
Dr. Curt Bears can you give input on her orthopedic surgery with less than 1 month post PPM.  What are your recommendations?

## 2018-08-20 NOTE — Telephone Encounter (Signed)
At this point, as long as they do not adjust her arm, okay for operation.

## 2018-08-20 NOTE — Telephone Encounter (Signed)
Pharm can you address eliquis thanks.

## 2018-08-20 NOTE — Telephone Encounter (Signed)
   [facility] Pre-operative Risk Assessment    Request for surgical clearance:  1. What type of surgery is being performed? LEFT ANKLE SYNDESMOSIS ORIF; RECONSTRUCTION OF DELTOID LIGAMENT, LEFT: ORIF - DISTAL TIBIOFIBULAR JOINT (SYNDESMOSIS)   2. When is this surgery scheduled? TBD   3. Are there any medications that need to be held prior to surgery and how long? ELIQUIS  4. Practice name and name of physician performing surgery? [facility]; DR. HEWITT  5. What is your office phone and fax number? P# [phone] F# [phone] ATTN: KELLY HANCOCK   6. Anesthesia type (None, local, MAC, general) ? GENERAL   Mattix Imhof 08/20/2018, 2:42 PM  _________________________________________________________________   (provider comments below)

## 2018-08-20 NOTE — Telephone Encounter (Signed)
-----   Message from Jacklynn Ganong, RN sent at 08/19/2018  5:03 PM EST ----- Patient needs surgical clearance for ortho surgery s/p PPM implant. And also, inquired about if she still needs to keep follow-up with Dr. Johnsie Cancel per [facility], prior to [facility].

## 2018-08-23 NOTE — Telephone Encounter (Signed)
**Note Tracy-Identified via Obfuscation**    Primary Cardiologist: Jenkins Rouge, MD  Chart reviewed as part of pre-operative protocol coverage. Patient was contacted 08/23/2018 in reference to pre-operative risk assessment for pending surgery as outlined below.  [name] was last seen on 08/05/2018 by Dr. Curt Bears.  Since that day, [name] has done well.  Therefore, based on ACC/AHA guidelines, the patient would be at acceptable risk for the planned procedure without further cardiovascular testing.   I will route this recommendation to the requesting party via Epic fax function and remove from pre-op pool.  Please call with questions.  Per pharmacist: "Pt takes Eliquis for afib with CHADS2VASc score of 3 (sex, HTN, CAD). Renal function is normal. Ok to hold Eliquis for 2-3 days as needed prior to surgery."  Per Dr. Curt Bears: "At this point, as long as they do not adjust her arm, okay for operation."  Almyra Deforest, PA 08/23/2018, 4:42 PM

## 2018-08-23 NOTE — Telephone Encounter (Signed)
Pt takes Eliquis for afib with CHADS2VASc score of 3 (sex, HTN, CAD). Renal function is normal. Ok to hold Eliquis for 2-3 days as needed prior to surgery.

## 2018-08-24 ENCOUNTER — Other Ambulatory Visit: Payer: Self-pay

## 2018-08-24 ENCOUNTER — Encounter (HOSPITAL_BASED_OUTPATIENT_CLINIC_OR_DEPARTMENT_OTHER): Payer: Self-pay | Admitting: *Deleted

## 2018-08-24 NOTE — Progress Notes (Signed)
Chart reviewed by Dr Lissa Hoard, aware of recent pacemaker insertion on 08-05-18 by Dr Curt Bears. Wants clarification of which arm should not be "adjusted" and clarify "adjusting arm ". I called over to heartcare and sopke to [place], she will sent not to PA. Pt OK for [place].

## 2018-08-24 NOTE — Telephone Encounter (Signed)
° °  Call from [place] Please clarify which arm should not be "adjusted" and clarify "adjusting arm"  Resend updated clearance

## 2018-08-24 NOTE — Telephone Encounter (Signed)
Faxed to [facility].

## 2018-08-24 NOTE — Telephone Encounter (Signed)
Her St Jude dual chamber pacemaker was placed 08/05/2018 in the left pectoral region, pacemaker lead goes from left axillary vein into the right atrium and right ventricule. Right arm is ok to move, but left arm should not be manipulated beyond 90 degrees to the side or forward. Definitely do not lift her left arm above her head to prevent pulling on the pacemaker lead which can cause lead dislodgement.  Do not hyperflex her left shoulder in a manner that can reach her right shoulder as such motion may cause her clavicle to grind on the 1st rib which may cause pacemaker lead in her left axillary vein to fracture.

## 2018-08-25 ENCOUNTER — Other Ambulatory Visit (HOSPITAL_COMMUNITY): Payer: Self-pay | Admitting: Orthopedic Surgery

## 2018-08-26 ENCOUNTER — Ambulatory Visit (HOSPITAL_BASED_OUTPATIENT_CLINIC_OR_DEPARTMENT_OTHER): Payer: BC Managed Care – PPO | Admitting: Anesthesiology

## 2018-08-26 ENCOUNTER — Other Ambulatory Visit: Payer: Self-pay

## 2018-08-26 ENCOUNTER — Encounter (HOSPITAL_BASED_OUTPATIENT_CLINIC_OR_DEPARTMENT_OTHER): Payer: Self-pay

## 2018-08-26 ENCOUNTER — Encounter (HOSPITAL_BASED_OUTPATIENT_CLINIC_OR_DEPARTMENT_OTHER)
Admission: RE | Disposition: A | Discharge status: Home / Self Care | Payer: Self-pay | Source: Ambulatory Visit | Attending: Orthopedic Surgery

## 2018-08-26 ENCOUNTER — Ambulatory Visit (HOSPITAL_BASED_OUTPATIENT_CLINIC_OR_DEPARTMENT_OTHER)
Admission: RE | Admit: 2018-08-26 | Discharge: 2018-08-26 | Disposition: A | Discharge status: Home / Self Care | Payer: BC Managed Care – PPO | Source: Ambulatory Visit | Attending: Orthopedic Surgery | Admitting: Orthopedic Surgery

## 2018-08-26 DIAGNOSIS — Z7901 Long term (current) use of anticoagulants: Secondary | ICD-10-CM | POA: Insufficient documentation

## 2018-08-26 DIAGNOSIS — Z950 Presence of cardiac pacemaker: Secondary | ICD-10-CM | POA: Insufficient documentation

## 2018-08-26 DIAGNOSIS — X58XXXA Exposure to other specified factors, initial encounter: Secondary | ICD-10-CM | POA: Diagnosis not present

## 2018-08-26 DIAGNOSIS — K219 Gastro-esophageal reflux disease without esophagitis: Secondary | ICD-10-CM | POA: Insufficient documentation

## 2018-08-26 DIAGNOSIS — E039 Hypothyroidism, unspecified: Secondary | ICD-10-CM | POA: Insufficient documentation

## 2018-08-26 DIAGNOSIS — S93432A Sprain of tibiofibular ligament of left ankle, initial encounter: Secondary | ICD-10-CM | POA: Insufficient documentation

## 2018-08-26 DIAGNOSIS — M1990 Unspecified osteoarthritis, unspecified site: Secondary | ICD-10-CM | POA: Diagnosis not present

## 2018-08-26 DIAGNOSIS — F17210 Nicotine dependence, cigarettes, uncomplicated: Secondary | ICD-10-CM | POA: Diagnosis not present

## 2018-08-26 DIAGNOSIS — Z79899 Other long term (current) drug therapy: Secondary | ICD-10-CM | POA: Diagnosis not present

## 2018-08-26 DIAGNOSIS — K7460 Unspecified cirrhosis of liver: Secondary | ICD-10-CM | POA: Diagnosis not present

## 2018-08-26 DIAGNOSIS — G609 Hereditary and idiopathic neuropathy, unspecified: Secondary | ICD-10-CM | POA: Diagnosis not present

## 2018-08-26 DIAGNOSIS — S93422A Sprain of deltoid ligament of left ankle, initial encounter: Secondary | ICD-10-CM | POA: Insufficient documentation

## 2018-08-26 DIAGNOSIS — E785 Hyperlipidemia, unspecified: Secondary | ICD-10-CM | POA: Diagnosis not present

## 2018-08-26 DIAGNOSIS — Z791 Long term (current) use of non-steroidal anti-inflammatories (NSAID): Secondary | ICD-10-CM | POA: Insufficient documentation

## 2018-08-26 DIAGNOSIS — J449 Chronic obstructive pulmonary disease, unspecified: Secondary | ICD-10-CM | POA: Diagnosis not present

## 2018-08-26 HISTORY — DX: Presence of cardiac pacemaker: Z95.0

## 2018-08-26 HISTORY — DX: Chronic obstructive pulmonary disease, unspecified: J44.9

## 2018-08-26 HISTORY — DX: Presence of other specified functional implants: Z96.89

## 2018-08-26 HISTORY — DX: Gastro-esophageal reflux disease without esophagitis: K21.9

## 2018-08-26 HISTORY — PX: ORIF ANKLE FRACTURE: SHX5408

## 2018-08-26 LAB — POCT I-STAT, CHEM 8
BUN: 20 mg/dL (ref 6–20)
Calcium, Ion: 1.23 mmol/L (ref 1.15–1.40)
Chloride: 108 mmol/L (ref 98–111)
Creatinine, Ser: 0.7 mg/dL (ref 0.44–1.00)
Glucose, Bld: 104 mg/dL — ABNORMAL HIGH (ref 70–99)
HEMATOCRIT: 40 % (ref 36.0–46.0)
Hemoglobin: 13.6 g/dL (ref 12.0–15.0)
Potassium: 3.7 mmol/L (ref 3.5–5.1)
Sodium: 144 mmol/L (ref 135–145)
TCO2: 24 mmol/L (ref 22–32)

## 2018-08-26 SURGERY — OPEN REDUCTION INTERNAL FIXATION (ORIF) ANKLE FRACTURE
Anesthesia: General | Site: Ankle | Laterality: Left

## 2018-08-26 MED ORDER — SODIUM CHLORIDE 0.9 % IV SOLN: INTRAVENOUS | Status: DC

## 2018-08-26 MED ORDER — MIDAZOLAM HCL 2 MG/2ML IJ SOLN
INTRAMUSCULAR | Status: AC
  Filled 2018-08-26: qty 2

## 2018-08-26 MED ORDER — FENTANYL CITRATE (PF) 100 MCG/2ML IJ SOLN
INTRAMUSCULAR | Status: AC
  Filled 2018-08-26: qty 2

## 2018-08-26 MED ORDER — PROPOFOL 10 MG/ML IV BOLUS
INTRAVENOUS | Status: DC | PRN
  Administered 2018-08-26: 150 mg via INTRAVENOUS

## 2018-08-26 MED ORDER — CEFAZOLIN SODIUM-DEXTROSE 2-4 GM/100ML-% IV SOLN
INTRAVENOUS | Status: AC
  Filled 2018-08-26: qty 100

## 2018-08-26 MED ORDER — LACTATED RINGERS IV SOLN
INTRAVENOUS | Status: DC
  Administered 2018-08-26: 10:00:00 via INTRAVENOUS

## 2018-08-26 MED ORDER — SCOPOLAMINE 1 MG/3DAYS TD PT72: 1.0000 | MEDICATED_PATCH | Freq: Once | TRANSDERMAL | Status: DC | PRN

## 2018-08-26 MED ORDER — ONDANSETRON HCL 4 MG/2ML IJ SOLN
INTRAMUSCULAR | Status: DC | PRN
  Administered 2018-08-26: 4 mg via INTRAVENOUS

## 2018-08-26 MED ORDER — ACETAMINOPHEN 10 MG/ML IV SOLN
INTRAVENOUS | Status: AC
  Filled 2018-08-26: qty 100

## 2018-08-26 MED ORDER — LIDOCAINE 2% (20 MG/ML) 5 ML SYRINGE
INTRAMUSCULAR | Status: DC | PRN
  Administered 2018-08-26: 80 mg via INTRAVENOUS

## 2018-08-26 MED ORDER — CEFAZOLIN SODIUM-DEXTROSE 2-4 GM/100ML-% IV SOLN
2.0000 g | INTRAVENOUS | Status: AC
  Administered 2018-08-26: 2 g via INTRAVENOUS

## 2018-08-26 MED ORDER — FENTANYL CITRATE (PF) 100 MCG/2ML IJ SOLN
50.0000 ug | INTRAMUSCULAR | Status: DC | PRN
  Administered 2018-08-26: 50 ug via INTRAVENOUS

## 2018-08-26 MED ORDER — ACETAMINOPHEN 10 MG/ML IV SOLN
1000.0000 mg | Freq: Four times a day (QID) | INTRAVENOUS | Status: DC
  Administered 2018-08-26: 1000 mg via INTRAVENOUS

## 2018-08-26 MED ORDER — MIDAZOLAM HCL 2 MG/2ML IJ SOLN
1.0000 mg | Freq: Once | INTRAMUSCULAR | Status: AC
  Administered 2018-08-26: 1 mg via INTRAVENOUS

## 2018-08-26 MED ORDER — ONDANSETRON HCL 4 MG/2ML IJ SOLN: 4.0000 mg | Freq: Once | INTRAMUSCULAR | Status: DC | PRN

## 2018-08-26 MED ORDER — MIDAZOLAM HCL 2 MG/2ML IJ SOLN
1.0000 mg | INTRAMUSCULAR | Status: DC | PRN
  Administered 2018-08-26 (×2): 1 mg via INTRAVENOUS

## 2018-08-26 MED ORDER — CHLORHEXIDINE GLUCONATE 4 % EX LIQD: 60.0000 mL | Freq: Once | CUTANEOUS | Status: DC

## 2018-08-26 MED ORDER — OXYCODONE HCL 5 MG PO TABS: 5.0000 mg | ORAL_TABLET | Freq: Four times a day (QID) | ORAL | 0 refills | Status: AC | PRN

## 2018-08-26 MED ORDER — ROPIVACAINE HCL 5 MG/ML IJ SOLN
INTRAMUSCULAR | Status: DC | PRN
  Administered 2018-08-26: 30 mL via PERINEURAL
  Administered 2018-08-26: 10 mL via PERINEURAL

## 2018-08-26 MED ORDER — FENTANYL CITRATE (PF) 100 MCG/2ML IJ SOLN
25.0000 ug | INTRAMUSCULAR | Status: DC | PRN
  Administered 2018-08-26 (×4): 50 ug via INTRAVENOUS

## 2018-08-26 MED ORDER — DEXAMETHASONE SODIUM PHOSPHATE 4 MG/ML IJ SOLN
INTRAMUSCULAR | Status: DC | PRN
  Administered 2018-08-26: 10 mg via INTRAVENOUS

## 2018-08-26 SURGICAL SUPPLY — 75 items
ANCH SUT 1 SHRT SM RGD INSRTR (Anchor) ×2 IMPLANT
ANCHOR SUT 1.45 SZ 1 SHORT (Anchor) ×4 IMPLANT
BANDAGE ESMARK 6X9 LF (GAUZE/BANDAGES/DRESSINGS) ×1 IMPLANT
BIT DRILL 2.5X2.75 QC CALB (BIT) ×2 IMPLANT
BLADE SURG 15 STRL LF DISP TIS (BLADE) ×2 IMPLANT
BLADE SURG 15 STRL SS (BLADE) ×9
BNDG CMPR 9X4 STRL LF SNTH (GAUZE/BANDAGES/DRESSINGS)
BNDG CMPR 9X6 STRL LF SNTH (GAUZE/BANDAGES/DRESSINGS) ×1
BNDG COHESIVE 4X5 TAN STRL (GAUZE/BANDAGES/DRESSINGS) ×3 IMPLANT
BNDG COHESIVE 6X5 TAN STRL LF (GAUZE/BANDAGES/DRESSINGS) ×3 IMPLANT
BNDG ESMARK 4X9 LF (GAUZE/BANDAGES/DRESSINGS) IMPLANT
BNDG ESMARK 6X9 LF (GAUZE/BANDAGES/DRESSINGS) ×3
CANISTER SUCT 1200ML W/VALVE (MISCELLANEOUS) ×3 IMPLANT
CHLORAPREP W/TINT 26ML (MISCELLANEOUS) ×3 IMPLANT
CORD BIPOLAR FORCEPS 12FT (ELECTRODE) ×2 IMPLANT
COVER BACK TABLE 60X90IN (DRAPES) ×3 IMPLANT
COVER WAND RF STERILE (DRAPES) IMPLANT
CUFF TOURNIQUET SINGLE 34IN LL (TOURNIQUET CUFF) ×2 IMPLANT
DECANTER SPIKE VIAL GLASS SM (MISCELLANEOUS) IMPLANT
DRAPE EXTREMITY T 121X128X90 (DRAPE) ×3 IMPLANT
DRAPE OEC MINIVIEW 54X84 (DRAPES) ×3 IMPLANT
DRAPE U-SHAPE 47X51 STRL (DRAPES) ×3 IMPLANT
DRSG MEPITEL 4X7.2 (GAUZE/BANDAGES/DRESSINGS) ×3 IMPLANT
DRSG PAD ABDOMINAL 8X10 ST (GAUZE/BANDAGES/DRESSINGS) ×6 IMPLANT
ELECT REM PT RETURN 9FT ADLT (ELECTROSURGICAL) ×3
ELECTRODE REM PT RTRN 9FT ADLT (ELECTROSURGICAL) ×1 IMPLANT
GAUZE SPONGE 4X4 12PLY STRL (GAUZE/BANDAGES/DRESSINGS) ×3 IMPLANT
GLOVE BIO SURGEON STRL SZ 6.5 (GLOVE) ×1 IMPLANT
GLOVE BIO SURGEON STRL SZ8 (GLOVE) ×3 IMPLANT
GLOVE BIO SURGEONS STRL SZ 6.5 (GLOVE) ×1
GLOVE BIOGEL PI IND STRL 7.0 (GLOVE) IMPLANT
GLOVE BIOGEL PI IND STRL 8 (GLOVE) ×2 IMPLANT
GLOVE BIOGEL PI INDICATOR 7.0 (GLOVE) ×4
GLOVE BIOGEL PI INDICATOR 8 (GLOVE) ×4
GLOVE ECLIPSE 8.0 STRL XLNG CF (GLOVE) ×3 IMPLANT
GOWN STRL REUS W/ TWL LRG LVL3 (GOWN DISPOSABLE) ×1 IMPLANT
GOWN STRL REUS W/ TWL XL LVL3 (GOWN DISPOSABLE) ×2 IMPLANT
GOWN STRL REUS W/TWL LRG LVL3 (GOWN DISPOSABLE) ×3
GOWN STRL REUS W/TWL XL LVL3 (GOWN DISPOSABLE) ×6
NEEDLE HYPO 22GX1.5 SAFETY (NEEDLE) IMPLANT
NS IRRIG 1000ML POUR BTL (IV SOLUTION) ×3 IMPLANT
PACK BASIN DAY SURGERY FS (CUSTOM PROCEDURE TRAY) ×3 IMPLANT
PAD CAST 4YDX4 CTTN HI CHSV (CAST SUPPLIES) ×1 IMPLANT
PADDING CAST ABS 4INX4YD NS (CAST SUPPLIES)
PADDING CAST ABS COTTON 4X4 ST (CAST SUPPLIES) IMPLANT
PADDING CAST COTTON 4X4 STRL (CAST SUPPLIES) ×3
PADDING CAST COTTON 6X4 STRL (CAST SUPPLIES) ×3 IMPLANT
PENCIL BUTTON HOLSTER BLD 10FT (ELECTRODE) ×3 IMPLANT
SANITIZER HAND PURELL 535ML FO (MISCELLANEOUS) ×3 IMPLANT
SCREW CORTICAL 3.5MM  55MM (Screw) ×4 IMPLANT
SCREW CORTICAL 3.5MM 50MM (Screw) ×2 IMPLANT
SCREW CORTICAL 3.5MM 55MM (Screw) IMPLANT
SHEET MEDIUM DRAPE 40X70 STRL (DRAPES) ×3 IMPLANT
SLEEVE SCD COMPRESS KNEE MED (MISCELLANEOUS) ×3 IMPLANT
SPLINT FAST PLASTER 5X30 (CAST SUPPLIES) ×40
SPLINT PLASTER CAST FAST 5X30 (CAST SUPPLIES) ×20 IMPLANT
SPONGE LAP 18X18 RF (DISPOSABLE) ×3 IMPLANT
STOCKINETTE 6  STRL (DRAPES) ×2
STOCKINETTE 6 STRL (DRAPES) ×1 IMPLANT
SUCTION FRAZIER HANDLE 10FR (MISCELLANEOUS) ×2
SUCTION TUBE FRAZIER 10FR DISP (MISCELLANEOUS) ×1 IMPLANT
SUT ETHILON 3 0 PS 1 (SUTURE) ×3 IMPLANT
SUT FIBERWIRE #2 38 T-5 BLUE (SUTURE)
SUT MNCRL AB 3-0 PS2 18 (SUTURE) ×2 IMPLANT
SUT VIC AB 0 SH 27 (SUTURE) ×2 IMPLANT
SUT VIC AB 2-0 SH 27 (SUTURE) ×3
SUT VIC AB 2-0 SH 27XBRD (SUTURE) ×1 IMPLANT
SUTURE FIBERWR #2 38 T-5 BLUE (SUTURE) IMPLANT
SYR BULB 3OZ (MISCELLANEOUS) ×3 IMPLANT
SYR CONTROL 10ML LL (SYRINGE) IMPLANT
TAP CORT 3.5MM SOLID DISP (TRAUMA) ×2 IMPLANT
TOWEL GREEN STERILE FF (TOWEL DISPOSABLE) ×6 IMPLANT
TUBE CONNECTING 20'X1/4 (TUBING) ×1
TUBE CONNECTING 20X1/4 (TUBING) ×2 IMPLANT
UNDERPAD 30X30 (UNDERPADS AND DIAPERS) ×3 IMPLANT

## 2018-08-26 NOTE — Anesthesia Postprocedure Evaluation (Signed)
Anesthesia Post Note  Patient: [name]  Procedure(s) Performed: Left ankle syndesmosis OPEN REDUCTION INTERNAL FIXATION (ORIF)/ reconstruction of deltoid ligament removal of hardware (Left Ankle)     Patient location during evaluation: PACU Anesthesia Type: General Level of consciousness: awake and alert Pain management: pain level controlled Vital Signs Assessment: post-procedure vital signs reviewed and stable Respiratory status: spontaneous breathing, nonlabored ventilation, respiratory function stable and patient connected to nasal cannula oxygen Cardiovascular status: blood pressure returned to baseline and stable Postop Assessment: no apparent nausea or vomiting Anesthetic complications: no Comments: Required supplemental adductor canal block, left, in PACU for medial ankle pain.    Last Vitals:  Vitals:   08/26/18 1340 08/26/18 1345  BP: (!) 121/92 116/75  Pulse: 60 64  Resp: 16 17  Temp: 36.7 C   SpO2: 100% 99%    Last Pain:  Vitals:   08/26/18 1340  TempSrc:   PainSc: 0-No pain                 Catalina Gravel

## 2018-08-26 NOTE — Discharge Instructions (Addendum)
Tracy Simmer, MD [place]  Please read the following information regarding your care after surgery.  Medications  You only need a prescription for the narcotic pain medicine (ex. oxycodone, Percocet, Norco).  All of the other medicines listed below are available over the counter. X Aleve 2 pills twice a day for the first 3 days after surgery. X acetominophen (Tylenol) 650 mg every 4-6 hours as you need for minor to moderate pain X continue percocet 10/325 mg as previously prescribed by Dr. Lorra Hals oxycodone as prescribed for break through pain  Narcotic pain medicine (ex. oxycodone, Percocet, Vicodin) will cause constipation.  To prevent this problem, take the following medicines while you are taking any pain medicine. X docusate sodium (Colace) 100 mg twice a day X senna (Senokot) 2 tablets twice a day  X To help prevent blood clots, resume eliquis tomorrow.  You should also get up every hour while you are awake to move around.    Weight Bearing X Do not bear any weight on the operated leg or foot.  Cast / Splint / Dressing X Keep your splint, cast or dressing clean and dry.  Dont put anything (coat hanger, pencil, etc) down inside of it.  If it gets damp, use a hair dryer on the cool setting to dry it.  If it gets soaked, call the office to schedule an appointment for a cast change.   After your dressing, cast or splint is removed; you may shower, but do not soak or scrub the wound.  Allow the water to run over it, and then gently pat it dry.  Swelling It is normal for you to have swelling where you had surgery.  To reduce swelling and pain, keep your toes above your nose for at least 3 days after surgery.  It may be necessary to keep your foot or leg elevated for several weeks.  If it hurts, it should be elevated.  Follow Up Call my office at [phone] when you are discharged from the hospital or surgery center to schedule an appointment to be seen two weeks after  surgery.  Call my office at [phone] if you develop a fever >101.5 F, nausea, vomiting, bleeding from the surgical site or severe pain.         Post Anesthesia Home Care Instructions  Activity: Get plenty of rest for the remainder of the day. A responsible individual must stay with you for 24 hours following the procedure.  For the next 24 hours, DO NOT: -Drive a car -Paediatric nurse -Drink alcoholic beverages -Take any medication unless instructed by your physician -Make any legal decisions or sign important papers.  Meals: Start with liquid foods such as gelatin or soup. Progress to regular foods as tolerated. Avoid greasy, spicy, heavy foods. If nausea and/or vomiting occur, drink only clear liquids until the nausea and/or vomiting subsides. Call your physician if vomiting continues.  Special Instructions/Symptoms: Your throat may feel dry or sore from the anesthesia or the breathing tube placed in your throat during surgery. If this causes discomfort, gargle with warm salt water. The discomfort should disappear within 24 hours.  If you had a scopolamine patch placed behind your ear for the management of post- operative nausea and/or vomiting:  1. The medication in the patch is effective for 72 hours, after which it should be removed.  Wrap patch in a tissue and discard in the trash. Wash hands thoroughly with soap and water. 2. You may remove the patch earlier  than 72 hours if you experience unpleasant side effects which may include dry mouth, dizziness or visual disturbances. 3. Avoid touching the patch. Wash your hands with soap and water after contact with the patch.    Post Anesthesia Home Care Instructions  Activity: Get plenty of rest for the remainder of the day. A responsible individual must stay with you for 24 hours following the procedure.  For the next 24 hours, DO NOT: -Drive a car -Paediatric nurse -Drink alcoholic beverages -Take any medication  unless instructed by your physician -Make any legal decisions or sign important papers.  Meals: Start with liquid foods such as gelatin or soup. Progress to regular foods as tolerated. Avoid greasy, spicy, heavy foods. If nausea and/or vomiting occur, drink only clear liquids until the nausea and/or vomiting subsides. Call your physician if vomiting continues.  Special Instructions/Symptoms: Your throat may feel dry or sore from the anesthesia or the breathing tube placed in your throat during surgery. If this causes discomfort, gargle with warm salt water. The discomfort should disappear within 24 hours.  If you had a scopolamine patch placed behind your ear for the management of post- operative nausea and/or vomiting:  1. The medication in the patch is effective for 72 hours, after which it should be removed.  Wrap patch in a tissue and discard in the trash. Wash hands thoroughly with soap and water. 2. You may remove the patch earlier than 72 hours if you experience unpleasant side effects which may include dry mouth, dizziness or visual disturbances. 3. Avoid touching the patch. Wash your hands with soap and water after contact with the patch.     Regional Anesthesia Blocks  1. Numbness or the inability to move the "blocked" extremity may last from 3-48 hours after placement. The length of time depends on the medication injected and your individual response to the medication. If the numbness is not going away after 48 hours, call your surgeon.  2. The extremity that is blocked will need to be protected until the numbness is gone and the  Strength has returned. Because you cannot feel it, you will need to take extra care to avoid injury. Because it may be weak, you may have difficulty moving it or using it. You may not know what position it is in without looking at it while the block is in effect.  3. For blocks in the legs and feet, returning to weight bearing and walking needs to be done  carefully. You will need to wait until the numbness is entirely gone and the strength has returned. You should be able to move your leg and foot normally before you try and bear weight or walk. You will need someone to be with you when you first try to ensure you do not fall and possibly risk injury.  4. Bruising and tenderness at the needle site are common side effects and will resolve in a few days.  5. Persistent numbness or new problems with movement should be communicated to the surgeon or the [place] [phone] [place] [phone]).

## 2018-08-26 NOTE — Progress Notes (Signed)
Assisted Dr. Turk with left, ultrasound guided, popliteal/saphenous block. Side rails up, monitors on throughout procedure. See vital signs in flow sheet. Tolerated Procedure well. 

## 2018-08-26 NOTE — Anesthesia Preprocedure Evaluation (Signed)
Anesthesia Evaluation  Patient identified by MRN, date of birth, ID band Patient awake    Reviewed: Allergy & Precautions, NPO status , Patient's Chart, lab work & pertinent test results  History of Anesthesia Complications (+) PROLONGED EMERGENCE and history of anesthetic complications  Airway Mallampati: II  TM Distance: >3 FB Neck ROM: Full    Dental  (+) Teeth Intact, Dental Advisory Given   Pulmonary COPD,  COPD inhaler, Current Smoker,    Pulmonary exam normal breath sounds clear to auscultation       Cardiovascular hypertension, Pt. on medications Normal cardiovascular exam+ pacemaker (Tachy/brady syndrome)  Rhythm:Regular Rate:Normal     Neuro/Psych  Neuromuscular disease    GI/Hepatic GERD  Medicated,(+) Cirrhosis     substance abuse (Remote EtOH abuse)  ,   Endo/Other  Hypothyroidism Obesity   Renal/GU negative Renal ROS     Musculoskeletal negative musculoskeletal ROS (+) Arthritis , LEFT ANKLE SPRAIN, DISPLACED BIMALLEOLAR FRACTURE   Abdominal   Peds  Hematology  (+) Blood dyscrasia (Eliquis), ,   Anesthesia Other Findings Day of surgery medications reviewed with the patient.  Reproductive/Obstetrics                             Anesthesia Physical Anesthesia Plan  ASA: III  Anesthesia Plan: General   Post-op Pain Management:    Induction: Intravenous  PONV Risk Score and Plan: 2 and Treatment may vary due to age or medical condition, Ondansetron and Dexamethasone  Airway Management Planned: LMA  Additional Equipment:   Intra-op Plan:   Post-operative Plan: Extubation in OR  Informed Consent: I have reviewed the patients History and Physical, chart, labs and discussed the procedure including the risks, benefits and alternatives for the proposed anesthesia with the patient or authorized representative who has indicated his/her understanding and acceptance.    Dental advisory given  Plan Discussed with: CRNA  Anesthesia Plan Comments:         Anesthesia Quick Evaluation

## 2018-08-26 NOTE — Transfer of Care (Signed)
Immediate Anesthesia Transfer of Care Note  Patient: MARLON VONRUDEN  Procedure(s) Performed: Left ankle syndesmosis OPEN REDUCTION INTERNAL FIXATION (ORIF)/ reconstruction of deltoid ligament (Left Ankle)  Patient Location: PACU  Anesthesia Type:General and Regional  Level of Consciousness: awake and sedated  Airway & Oxygen Therapy: Patient Spontanous Breathing and Patient connected to face mask oxygen  Post-op Assessment: Report given to RN and Post -op Vital signs reviewed and stable  Post vital signs: Reviewed and stable  Last Vitals:  Vitals Value Taken Time  BP    Temp    Pulse 68 08/26/2018 11:55 AM  Resp 16 08/26/2018 11:55 AM  SpO2 97 % 08/26/2018 11:55 AM  Vitals shown include unvalidated device data.  Last Pain:  Vitals:   08/26/18 0955  TempSrc: Oral  PainSc: 5          Complications: No apparent anesthesia complications

## 2018-08-26 NOTE — Anesthesia Procedure Notes (Signed)
Procedure Name: LMA Insertion Performed by: Martita Brumm W, CRNA Pre-anesthesia Checklist: Patient identified, Emergency Drugs available, Suction available and Patient being monitored Patient Re-evaluated:Patient Re-evaluated prior to induction Oxygen Delivery Method: Circle system utilized Preoxygenation: Pre-oxygenation with 100% oxygen Induction Type: IV induction Ventilation: Mask ventilation without difficulty LMA: LMA inserted LMA Size: 4.0 Number of attempts: 1 Placement Confirmation: positive ETCO2 Tube secured with: Tape Dental Injury: Teeth and Oropharynx as per pre-operative assessment        

## 2018-08-26 NOTE — Op Note (Signed)
08/26/2018  12:08 PM  PATIENT:  [name]  57 y.o. female  PRE-OPERATIVE DIAGNOSIS: 1.  Left ankle syndesmosis disruption 2.  Left deltoid ligament rupture  POST-OPERATIVE DIAGNOSIS: Same  Procedure(s): 1.  Removal of deep implants from the left ankle lateral malleolus 2.  Open treatment of left ankle syndesmosis disruption with internal fixation 3.  Reconstruction of left ankle deltoid ligament through separate incision 4.  AP, lateral and mortise radiographs of the left ankle  SURGEON:  Wylene Simmer, MD  ASSISTANT: Mechele Claude, PA-C  ANESTHESIA:   General, regional  EBL:  minimal   TOURNIQUET:   Total Tourniquet Time Documented: Thigh (Left) - 63 minutes Total: Thigh (Left) - 63 minutes  COMPLICATIONS:  None apparent  DISPOSITION:  Extubated, awake and stable to recovery.  INDICATION FOR PROCEDURE: The patient is a 57 year old female with past medical history significant for smoking and cardiac arrhythmia.  She injured her left ankle at work back in August.  She underwent open treatment of her by mouth fracture with repair of her deltoid ligament at that time.  In the postoperative period she had displacement of the syndesmosis with failure of her deltoid ligament repair.  She presents now for open treatment of the syndesmosis disruption and reconstruction of the deltoid ligament.  The risks and benefits of the alternative treatment options have been discussed in detail.  The patient wishes to proceed with surgery and specifically understands risks of bleeding, infection, nerve damage, blood clots, need for additional surgery, amputation and death.  PROCEDURE IN DETAIL:  After pre operative consent was obtained, and the correct operative site was identified, the patient was brought to the operating room and placed supine on the OR table.  Anesthesia was administered.  Pre-operative antibiotics were administered.  A surgical timeout was taken.  The left lower extremity was  prepped and draped in standard sterile fashion with a tourniquet around the thigh.  The extremity was elevated and the tourniquet was inflated to 250 mmHg.  Patient's lateral incision was opened again sharply and dissection was carried down through the subcutaneous tissues to the lateral plate.  There was some mucinous material around the plate.  This was collected and sent as a specimen to microbiology for aerobic and anaerobic culture.  The syndesmosis was then identified and opened again.  It was debrided of all fibrous tissue to allow appropriate reduction.  Attention was then turned to the medial malleolus.  The previous incision was opened again sharply and dissection was carried down through the subcutaneous tissues.  The deltoid was released from its origin on the medial malleolus.  The medial gutter was exposed and cleaned of all fibrous tissue.  The joint was then reduced and held with a King tong.  AP, mortise and lateral radiographs confirmed appropriate reduction of the syndesmosis.  Screws were then removed from the lateral plate to allow appropriate positioning of syndesmosis screws.  The central portion of the plate was then used to insert 3 screws 3.5 mm in diameter across all 4 cortices of the distal fibula and tibia.  Radiographs confirmed appropriate position of the hardware and appropriate reduction of the joint.  Attention was returned to the medial malleolus.  2 Biomet juggernaut anchors were inserted at the medial malleolus.  These were passed through the deep soft tissues pulling the deltoid back up onto the exposed bone of the medial malleolus.  The repair was reinforced with 0 Vicryl figure-of-eight sutures.  Final AP, lateral and mortise radiographs  confirmed appropriate position and length of all hardware and appropriate reduction of the ankle joint and syndesmosis.  Wounds were irrigated copiously.  Subcutaneous tissues were approximated with 2-0 Vicryl.  Skin incisions were  closed with nylon.  Sterile dressings were applied followed by a well-padded short leg splint.  Tourniquet was released after application of the dressings.  The patient was awakened from anesthesia and transported to the recovery room in stable condition.   FOLLOW UP PLAN: Nonweightbearing on the left lower extremity.  Follow-up in the office in 2 weeks for suture removal and conversion to a short leg cast.  Resume Eliquis tomorrow for DVT prophylaxis.   RADIOGRAPHS: AP, mortise and lateral radiographs of the left ankle are obtained intraoperatively.  These show interval reduction and fixation of the syndesmosis and appropriate alignment of the ankle joint.  Hardware is appropriately positioned and of the appropriate lengths.   Mechele Claude PA-C was present and scrubbed for the duration of the operative case. His assistance was essential in positioning the patient, prepping and draping, gaining and maintaining exposure, performing the operation, closing and dressing the wounds and applying the splint.

## 2018-08-26 NOTE — H&P (Signed)
[name] is an 57 y.o. female.   Chief Complaint: Left ankle pain HPI: The patient is a 57 year old female with a history of left ankle fracture.  She underwent open treatment of her lateral malleolus fracture several months ago.  After surgery she was identified as having a cardiac dysrhythmia.  She underwent pacemaker placement.  In the postoperative period she was also noted to have disruption of her syndesmosis and failure of her deltoid ligament.  She presents now for open treatment of these injuries.  Past Medical History:  Diagnosis Date  . Alcohol abuse    a. quit several years ago.  . Allergy   . Anemia    younger  . Arthritis    feet,knees  . B12 deficiency   . BPPV (benign paroxysmal positional vertigo)   . Cirrhosis ([place])   . Complication of anesthesia    "slow to wake"  . Constipation    on movantik- stools regular and soft on this  . COPD (chronic obstructive pulmonary disease) ([place])   . Foot drop   . GERD (gastroesophageal reflux disease)   . Hyperlipidemia   . Hypothyroidism   . Neuromuscular disorder (HCC)    idiopathic neuropathy  . 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 Spinal cord stimulator status   . Spinal stenosis   . Tobacco abuse     Past Surgical History:  Procedure Laterality Date  . ANKLE SURGERY    . CERVICAL CONE BIOPSY    . CESAREAN SECTION    . COLONOSCOPY    . HERNIA REPAIR    . KNEE SURGERY     x5  . ORIF ANKLE FRACTURE Left 06/02/2018   Procedure: OPEN REDUCTION INTERNAL FIXATION (ORIF) ANKLE FRACTURE;  Surgeon: Wylene Simmer, MD;  Location: [facility];  Service: Orthopedics;  Laterality: Left;  . PACEMAKER IMPLANT N/A 08/05/2018   Procedure: PACEMAKER IMPLANT;  Surgeon: Constance Haw, MD;  Location: [facility];  Service: Cardiovascular;  Laterality: N/A;  . POLYPECTOMY    . SPINAL CORD STIMULATOR INSERTION N/A 05/07/2016   Procedure: LUMBAR SPINAL CORD  STIMULATOR INSERTION;  Surgeon: Melina Schools, MD;  Location: [facility];  Service: Orthopedics;  Laterality: N/A;    Family History  Adopted: Yes  Family history unknown: Yes   Social History:  reports that she has been smoking cigarettes. She has a 30.00 pack-year smoking history. She has never used smokeless tobacco. She reports that she drank alcohol. She reports that she does not use drugs.  Allergies:  Allergies  Allergen Reactions  . Sulfamethoxazole Hives    GI Upset  . Vancomycin Hives    Medications Prior to Admission  Medication Sig Dispense Refill  . apixaban (ELIQUIS) 5 MG TABS tablet Take 1 tablet (5 mg total) by mouth 2 (two) times daily. 180 tablet 3  . BREO ELLIPTA 200-25 MCG/INH AEPB Inhale 1 puff into the lungs daily.  11  . docusate sodium (COLACE) 100 MG capsule Take 1 capsule (100 mg total) by mouth 2 (two) times daily. While taking narcotic pain medicine. (Patient taking differently: Take 100 mg by mouth 2 (two) times daily as needed (constipation). While taking narcotic pain medicine.) 30 capsule 0  . dronedarone (MULTAQ) 400 MG tablet Take 1 tablet (400 mg total) by mouth 2 (two) times daily with a meal. 60 tablet 6  . famotidine (PEPCID) 20 MG tablet Take 20 mg by mouth 3 (three) times daily.  0  . folic acid (FOLVITE) 1 MG tablet Take 1 mg by mouth daily.     . hydrochlorothiazide (HYDRODIURIL) 25 MG tablet Take 25 mg by mouth daily.  1  . ibuprofen (ADVIL,MOTRIN) 800 MG tablet Take 800 mg by mouth 3 (three) times daily.     Marland Kitchen L-Methylfolate-Algae-B12-B6 (METANX) 3-90.314-2-35 MG CAPS Take 1 capsule by mouth 2 (two) times daily.  5  . levothyroxine (SYNTHROID) 25 MCG tablet Take 25 mcg by mouth daily before breakfast.     . LINZESS 145 MCG CAPS capsule Take 145 mcg by mouth 2 (two) times daily.     Marland Kitchen oxyCODONE-acetaminophen (PERCOCET) 10-325 MG tablet Take 1 tablet by mouth every 4 (four) hours as needed for pain. 60 tablet 0  . pregabalin (LYRICA) 150 MG capsule  Take 150 mg by mouth 3 (three) times daily.     . rosuvastatin (CRESTOR) 10 MG tablet Take 10 mg by mouth every evening.     . senna (SENOKOT) 8.6 MG TABS tablet Take 2 tablets (17.2 mg total) by mouth 2 (two) times daily. (Patient taking differently: Take 2 tablets by mouth 2 (two) times daily as needed (constipation). ) 30 each 0  . Vitamin D, Ergocalciferol, (DRISDOL) 50000 units CAPS capsule Take 50,000 Units by mouth every Wednesday.     Marland Kitchen albuterol (PROVENTIL HFA;VENTOLIN HFA) 108 (90 Base) MCG/ACT inhaler Inhale 2 puffs into the lungs every 6 (six) hours as needed for wheezing or shortness of breath.     . fluticasone (FLONASE) 50 MCG/ACT nasal spray Place 2 sprays into both nostrils daily as needed for allergies (seasonal allergies).   11  . PAZEO 0.7 % SOLN Place 1 drop into both eyes daily as needed (allergies).   3    Results for orders placed or performed during the hospital encounter of 08/26/18 (from the past 48 hour(s))  I-STAT, chem 8     Status: Abnormal   Collection Time: 08/26/18  9:48 AM  Result Value Ref Range   Sodium 144 135 - 145 mmol/L   Potassium 3.7 3.5 - 5.1 mmol/L   Chloride 108 98 - 111 mmol/L   BUN 20 6 - 20 mg/dL   Creatinine, Ser 0.70 0.44 - 1.00 mg/dL   Glucose, Bld 104 (H) 70 - 99 mg/dL   Calcium, Ion 1.23 1.15 - 1.40 mmol/L   TCO2 24 22 - 32 mmol/L   Hemoglobin 13.6 12.0 - 15.0 g/dL   HCT 40.0 36.0 - 46.0 %   No results found.  ROS no recent fever, chills, nausea, vomiting or changes in her appetite.  Blood pressure 109/63, pulse 66, temperature 98 F (36.7 C), temperature source Oral, resp. rate 18, height 5\' 9"  (1.753 m), weight 115 kg, SpO2 96 %. Physical Exam  Well-nourished well-developed woman in no apparent distress.  Alert and oriented x4.  Mood and affect are normal.  Extraocular motions are intact.  Respirations are unlabored.  Gait is antalgic to the left.  Left ankle is slightly externally rotated with hindfoot valgus.  Skin is healthy  and intact.  Medial and lateral surgical incisions are healed.  No signs of infection.  No lymphadenopathy.  Pulses are palpable.  5 out of 5 strength in plantar flexion and dorsiflexion of the toes.  Assessment/Plan Left ankle syndesmosis disruption and deltoid ligament rupture -to the operating room for open treatment of the syndesmosis disruption with internal fixation and reconstruction of the deltoid ligament.  The risks and benefits of the alternative  treatment options have been discussed in detail.  The patient wishes to proceed with surgery and specifically understands risks of bleeding, infection, nerve damage, blood clots, need for additional surgery, amputation and death.   Wylene Simmer, MD 19-Sep-2018, 10:04 AM

## 2018-08-26 NOTE — Progress Notes (Signed)
AssistedDr. Turk with left, ultrasound guided, adductor canal block. Side rails up, monitors on throughout procedure. See vital signs in flow sheet. Tolerated Procedure well.  

## 2018-08-26 NOTE — Anesthesia Procedure Notes (Signed)
Anesthesia Regional Block: Popliteal block   Pre-Anesthetic Checklist: ,, timeout performed, Correct Patient, Correct Site, Correct Laterality, Correct Procedure, Correct Position, site marked, Risks and benefits discussed,  Surgical consent,  Pre-op evaluation,  At surgeon's request and post-op pain management  Laterality: Left  Prep: chloraprep       Needles:  Injection technique: Single-shot  Needle Type: Echogenic Needle     Needle Length: 9cm  Needle Gauge: 21     Additional Needles:   Procedures:,,,, ultrasound used (permanent image in chart),,,,  Narrative:  Start time: 08/26/2018 10:04 AM End time: 08/26/2018 10:09 AM Injection made incrementally with aspirations every 5 mL.  Performed by: Personally  Anesthesiologist: Catalina Gravel, MD  Additional Notes: No pain on injection. No increased resistance to injection. Injection made in 5cc increments.  Good needle visualization.  Patient tolerated procedure well.

## 2018-08-26 NOTE — Anesthesia Procedure Notes (Signed)
Anesthesia Regional Block: Adductor canal block   Pre-Anesthetic Checklist: ,, timeout performed, Correct Patient, Correct Site, Correct Laterality, Correct Procedure, Correct Position, site marked, Risks and benefits discussed,  Surgical consent,  Pre-op evaluation,  At surgeon's request and post-op pain management  Laterality: Left  Prep: chloraprep       Needles:  Injection technique: Single-shot  Needle Type: Echogenic Needle     Needle Length: 9cm  Needle Gauge: 21     Additional Needles:   Procedures:,,,, ultrasound used (permanent image in chart),,,,  Narrative:  Start time: 08/26/2018 10:09 AM End time: 08/26/2018 10:12 AM Injection made incrementally with aspirations every 5 mL.  Performed by: Personally  Anesthesiologist: Catalina Gravel, MD  Additional Notes: No pain on injection. No increased resistance to injection. Injection made in 5cc increments.  Good needle visualization.  Patient tolerated procedure well.

## 2018-08-27 ENCOUNTER — Encounter (HOSPITAL_BASED_OUTPATIENT_CLINIC_OR_DEPARTMENT_OTHER): Payer: Self-pay | Admitting: Orthopedic Surgery

## 2018-08-31 LAB — AEROBIC/ANAEROBIC CULTURE (SURGICAL/DEEP WOUND)

## 2018-08-31 LAB — AEROBIC/ANAEROBIC CULTURE W GRAM STAIN (SURGICAL/DEEP WOUND): Culture: NO GROWTH

## 2018-09-16 ENCOUNTER — Ambulatory Visit: Payer: BC Managed Care – PPO | Admitting: Cardiovascular Disease

## 2018-09-21 ENCOUNTER — Ambulatory Visit: Payer: BC Managed Care – PPO | Admitting: Cardiology

## 2018-10-25 NOTE — Progress Notes (Signed)
Date:  10/28/2018   ID:  [name], DOB 01-13-1961, MRN [id number]  PCP:  Aletha Halim., PA-C  Cardiologist:  Johnsie Cancel Primary Electrophysiologist:  Jenkins Rouge, MD    No chief complaint on file.    History of Present Illness: [name] is a 58 y.o. female  a history of tobacco abuse, alcohol abuse, cirrhosis, B12 deficiency, aortic atherosclerosis, and coronary disease by CT scan, COPD, hypothyroidism, hyperlipidemia. Had pre syncope with  Tachy brady syndrome and LBBB Broke her right foot Seen by Dr Curt Bears and PPM placed on 07/3118 [facility] Jude Device not pacer dependant. She was placed on eliquis for PAF. TTE 06/01/18 suggested EF was 45-50% with moderate LAE.  No history of CAD Myovue done 07/29/18 low risk breast attenuation no ischemia EF estimated at 43%  Subsequently had surgery on her left foot by Dr Doran Durand. Eliquis held for 3 days prior   No cardiac complaints still in boot for left foot  Discussed starting low dose ACE for her low EF She has no allergies BP a bit soft     Past Medical History:  Diagnosis Date  . Alcohol abuse    a. quit several years ago.  . Allergy   . Anemia    younger  . Arthritis    feet,knees  . B12 deficiency   . BPPV (benign paroxysmal positional vertigo)   . Cirrhosis ([place])   . Complication of anesthesia    "slow to wake"  . Constipation    on movantik- stools regular and soft on this  . COPD (chronic obstructive pulmonary disease) ([place])   . Foot drop   . GERD (gastroesophageal reflux disease)   . Hyperlipidemia   . Hypothyroidism   . Neuromuscular disorder (HCC)    idiopathic neuropathy  . 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 Spinal cord stimulator status   . Spinal stenosis   . Tobacco abuse    Past Surgical History:  Procedure Laterality Date  . ANKLE SURGERY    . CERVICAL CONE BIOPSY    . CESAREAN SECTION    . COLONOSCOPY    . HERNIA REPAIR     . KNEE SURGERY     x5  . ORIF ANKLE FRACTURE Left 06/02/2018   Procedure: OPEN REDUCTION INTERNAL FIXATION (ORIF) ANKLE FRACTURE;  Surgeon: Wylene Simmer, MD;  [facility];  Service: Orthopedics;  Laterality: Left;  . ORIF ANKLE FRACTURE Left 08/26/2018   Procedure: Left ankle syndesmosis OPEN REDUCTION INTERNAL FIXATION (ORIF)/ reconstruction of deltoid ligament;  Surgeon: Wylene Simmer, MD;  Location: [facility];  Service: Orthopedics;  Laterality: Left;  . PACEMAKER IMPLANT N/A 08/05/2018   Procedure: PACEMAKER IMPLANT;  Surgeon: Constance Haw, MD;  Location: [facility];  Service: Cardiovascular;  Laterality: N/A;  . POLYPECTOMY    . SPINAL CORD STIMULATOR INSERTION N/A 05/07/2016   Procedure: LUMBAR SPINAL CORD STIMULATOR INSERTION;  Surgeon: Melina Schools, MD;  Location: [facility];  Service: Orthopedics;  Laterality: N/A;     Current Outpatient Medications  Medication Sig Dispense Refill  . albuterol (PROVENTIL HFA;VENTOLIN HFA) 108 (90 Base) MCG/ACT inhaler Inhale 2 puffs into the lungs every 6 (six) hours as needed for wheezing or shortness of breath.     Marland Kitchen apixaban (ELIQUIS) 5 MG TABS tablet Take 1 tablet (5 mg total) by mouth 2 (two) times daily. 180 tablet 3  . Biotin 1000 MCG CHEW Chew  by mouth.    Marland Kitchen BREO ELLIPTA 200-25 MCG/INH AEPB Inhale 1 puff into the lungs daily.  11  . docusate sodium (COLACE) 100 MG capsule Take 1 capsule (100 mg total) by mouth 2 (two) times daily. While taking narcotic pain medicine. (Patient taking differently: Take 100 mg by mouth 2 (two) times daily as needed (constipation). While taking narcotic pain medicine.) 30 capsule 0  . dronedarone (MULTAQ) 400 MG tablet Take 1 tablet (400 mg total) by mouth 2 (two) times daily with a meal. 60 tablet 6  . famotidine (PEPCID) 20 MG tablet Take 20 mg by mouth 3 (three) times daily.  0  . fluticasone (FLONASE) 50 MCG/ACT nasal spray Place 2 sprays into both nostrils daily as needed for  allergies (seasonal allergies).   11  . folic acid (FOLVITE) 1 MG tablet Take 1 mg by mouth daily.     . hydrochlorothiazide (HYDRODIURIL) 25 MG tablet Take 25 mg by mouth daily.  1  . ibuprofen (ADVIL,MOTRIN) 800 MG tablet Take 800 mg by mouth 3 (three) times daily.     Marland Kitchen L-Methylfolate-Algae-B12-B6 (METANX) 3-90.314-2-35 MG CAPS Take 1 capsule by mouth 2 (two) times daily.  5  . levothyroxine (SYNTHROID) 25 MCG tablet Take 25 mcg by mouth daily before breakfast.     . LINZESS 145 MCG CAPS capsule Take 145 mcg by mouth daily.     Marland Kitchen oxyCODONE-acetaminophen (PERCOCET) 10-325 MG tablet Take 1 tablet by mouth every 4 (four) hours as needed for pain. 60 tablet 0  . PAZEO 0.7 % SOLN Place 1 drop into both eyes daily as needed (allergies).   3  . pregabalin (LYRICA) 150 MG capsule Take 150 mg by mouth 3 (three) times daily.     . rosuvastatin (CRESTOR) 10 MG tablet Take 10 mg by mouth every evening.     . senna (SENOKOT) 8.6 MG TABS tablet Take 2 tablets (17.2 mg total) by mouth 2 (two) times daily. (Patient taking differently: Take 2 tablets by mouth 2 (two) times daily as needed (constipation). ) 30 each 0  . Vitamin D, Ergocalciferol, (DRISDOL) 50000 units CAPS capsule Take 50,000 Units by mouth every Wednesday.     Marland Kitchen lisinopril (PRINIVIL,ZESTRIL) 2.5 MG tablet Take 1 tablet (2.5 mg total) by mouth daily. 90 tablet 3   No current facility-administered medications for this visit.     Allergies:   Sulfamethoxazole and Vancomycin   Social History:  The patient  reports that she has been smoking cigarettes. She has a 30.00 pack-year smoking history. She has never used smokeless tobacco. She reports previous alcohol use. She reports that she does not use drugs.   Family History:  The patient's She was adopted. Family history is unknown by patient.    ROS:  Please see the history of present illness.   Otherwise, review of systems is positive for back pain, balance problems.   All other systems are  reviewed and negative.   PHYSICAL EXAM: VS:  BP 100/60   Pulse 74   Ht 5\' 9"  (1.753 m)   Wt 257 lb 1.9 oz (116.6 kg)   LMP  (LMP Unknown)   SpO2 94%   BMI 37.97 kg/m  , BMI Body mass index is 37.97 kg/m. Affect appropriate Obese chronically ill female  HEENT: normal Neck supple with no adenopathy JVP normal no bruits no thyromegaly Lungs clear with no wheezing and good diaphragmatic motion Heart:  S1/S2 no murmur, no rub, gallop or click PMI normal PPM  under left clavicle  Abdomen: benighn, BS positve, no tenderness, no AAA no bruit.  No HSM or HJR Distal pulses intact with no bruits No edema Neuro non-focal Skin warm and dry Post left ankle surgery   EKG:   A pacing LBBB 08/06/18   Recent Labs: 06/02/2018: TSH 1.225 06/03/2018: ALT 16; Magnesium 1.9 07/09/2018: Platelets 161 08/26/2018: BUN 20; Creatinine, Ser 0.70; Hemoglobin 13.6; Potassium 3.7; Sodium 144    Lipid Panel     Component Value Date/Time   CHOL 119 06/02/2018 0325   TRIG 104 06/02/2018 0325   HDL 39 (L) 06/02/2018 0325   CHOLHDL 3.1 06/02/2018 0325   VLDL 21 06/02/2018 0325   LDLCALC 59 06/02/2018 0325     Wt Readings from Last 3 Encounters:  10/28/18 257 lb 1.9 oz (116.6 kg)  08/26/18 253 lb 8.5 oz (115 kg)  08/06/18 253 lb 8 oz (115 kg)      Other studies Reviewed: Additional studies/ records that were reviewed today include: TTE 06/01/18  Review of the above records today demonstrates:  - Left ventricle: Mild diffuse hypo kinesis with abnormal septal   motion likely from LBBB. The cavity size was mildly dilated. Wall   thickness was normal. Systolic function was mildly reduced. The   estimated ejection fraction was in the range of 45% to 50%. Left   ventricular diastolic function parameters were normal. - Mitral valve: Calcified annulus. Mildly thickened leaflets . - Left atrium: The atrium was moderately dilated. - Right ventricle: The cavity size was mildly dilated. - Right atrium:  The atrium was moderately dilated. - Atrial septum: No defect or patent foramen ovale was identified.  Myoview 07/29/18  The left ventricular ejection fraction is moderately decreased (30-44%).  Nuclear stress EF is calculated at 43% but appears better visually. There is paradoxical septal motion due to LBBB  There was no ST segment deviation noted during stress.  There is a medium defect of moderate severity present in the mid anteroseptal, apical anterior, apical septal and apex location. The defect is non-reversible and likely related to breast attenuation artifact. No ischemia noted.  This is a low risk study.  30 day monitor 07/15/18 - personally reviewed NSR PAF rapid rates 160 Conversion pauses greater than 3 seconds  ASSESSMENT AND PLAN:  1.  Paroxysmal atrial fibrillation: Currently on Eliquis.  CHADVASC 2 maintaining NSR with atrial pacing   2.  Systolic heart failure: Ejection fraction 45 to 50%.  Low risk myovue October 23,10 no chest pain Cr  0.70 on 10/11/18 not clear why she is not on ACE/ARB No allergies noted to this class of meds will try to start her on lisinopril 2.5 mg daily f/u  BMET  in 4 weeks  3.  Coronary atherosclerosis: Found on CT scan 05/2018. Non ischemic myovue 07/27/18 no chest pain observe  4. PPM:  Normal function f/u with Dr Curt Bears  5. Ortho : post left foot surgery f/u with Dr Doran Durand for f/u xray continue to wear boot    Disposition:   FU with Will Camnitz 3 months and me in 6 months   Signed, Jenkins Rouge, MD  10/28/2018 10:27 AM     [place] [address] [place] [address] [phone] (office) [phone] (fax)

## 2018-10-28 ENCOUNTER — Ambulatory Visit: Payer: BC Managed Care – PPO | Admitting: Cardiovascular Disease

## 2018-10-28 ENCOUNTER — Encounter: Payer: Self-pay | Admitting: Cardiovascular Disease

## 2018-10-28 VITALS — BP 100/60 | HR 74 | Ht 69.0 in | Wt 257.1 lb

## 2018-10-28 DIAGNOSIS — I480 Paroxysmal atrial fibrillation: Secondary | ICD-10-CM | POA: Diagnosis not present

## 2018-10-28 DIAGNOSIS — I5020 Unspecified systolic (congestive) heart failure: Secondary | ICD-10-CM | POA: Diagnosis not present

## 2018-10-28 MED ORDER — LISINOPRIL 2.5 MG PO TABS: 2.5000 mg | ORAL_TABLET | Freq: Every day | ORAL | 3 refills | Status: DC

## 2018-10-28 NOTE — Patient Instructions (Signed)
Medication Instructions:  Your physician has recommended you make the following change in your medication:  1-START Lisinopril 2.5 mg by mouth daily.  If you need a refill on your cardiac medications before your next appointment, please call your pharmacy.   Lab work: Your physician recommends that you return for lab work in: 3 weeks for BMET.  If you have labs (blood work) drawn today and your tests are completely normal, you will receive your results only by: Marland Kitchen MyChart Message (if you have MyChart) OR . A paper copy in the mail If you have any lab test that is abnormal or we need to change your treatment, we will call you to review the results.  Testing/Procedures: None ordered today.  Follow-Up: At [facility], you and your health needs are our priority.  As part of our continuing mission to provide you with exceptional heart care, we have created designated Provider Care Teams.  These Care Teams include your primary Cardiologist (physician) and Advanced Practice Providers (APPs -  Physician Assistants and Nurse Practitioners) who all work together to provide you with the care you need, when you need it. You will need a follow up appointment in:  6 months.  Please call our office 2 months in advance to schedule this appointment.  You may see Jenkins Rouge, MD or one of the following Advanced Practice Providers on your designated Care Team: Richardson Dopp, PA-C [place], [place] . Daune Perch, NP

## 2018-11-05 ENCOUNTER — Ambulatory Visit (INDEPENDENT_AMBULATORY_CARE_PROVIDER_SITE_OTHER): Payer: BC Managed Care – PPO | Admitting: Cardiology

## 2018-11-05 ENCOUNTER — Encounter: Payer: Self-pay | Admitting: Cardiology

## 2018-11-05 VITALS — BP 110/62 | HR 72 | Ht 69.0 in | Wt 253.0 lb

## 2018-11-05 DIAGNOSIS — I480 Paroxysmal atrial fibrillation: Secondary | ICD-10-CM

## 2018-11-05 NOTE — Progress Notes (Signed)
Electrophysiology Office Note   Date:  11/05/2018   ID:  [name], DOB 29-Sep-1961, MRN [id number]  PCP:  Aletha Halim., PA-C  Cardiologist:  Johnsie Cancel Primary Electrophysiologist:  Will Meredith [place], MD    No chief complaint on file.    History of Present Illness: [name] is a 58 y.o. 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 Presenting today for electrophysiology evaluation.  She has a history of tobacco abuse, alcohol abuse, cirrhosis, B12 deficiency, aortic atherosclerosis, and coronary disease by CT scan, COPD, hypothyroidism, hyperlipidemia.  She presented to the hospital in August 2019 with a brown out episode.  The morning of the event, she woke up feeling well at 4 AM prior to going to work.  She felt more tired than usual.  She works in a [employer].  She was standing in line when she felt like a table that collapsed.  Her legs went underneath her.  She did not trip or lose her balance.  She nearly passed out but did not completely lose consciousness.  In the emergency room she was noted to be intermittently bradycardic into the mid 40s as well as tachycardic into the 120s which appeared to be atrial flutter/fibrillation.  She was noted to have atrial fibrillation and atrial flutter while she was hospitalized.  She had intermittent 3.4-second pauses.  She was put on IV amiodarone during her hospitalization.  She was started on Eliquis.  She was discharged without rhythm control.  She is now status post [place] dual-chamber pacemaker implanted 07/28/2018.  Today, denies symptoms of palpitations, chest pain, shortness of breath, orthopnea, PND, lower extremity edema, claudication, dizziness, presyncope, syncope, bleeding, or neurologic sequela. The patient is tolerating medications without difficulties.  Overall she is doing well.  She has no chest pain or shortness of breath.   Her [facility] pacemaker is functioning well and she is having no issues.   Past Medical History:  Diagnosis Date  . Alcohol abuse    a. quit several years ago.  . Allergy   . Anemia    younger  . Arthritis    feet,knees  . B12 deficiency   . BPPV (benign paroxysmal positional vertigo)   . Cirrhosis ([place])   . Complication of anesthesia    "slow to wake"  . Constipation    on movantik- stools regular and soft on this  . COPD (chronic obstructive pulmonary disease) ([place])   . Foot drop   . GERD (gastroesophageal reflux disease)   . Hyperlipidemia   . Hypothyroidism   . Neuromuscular disorder (HCC)    idiopathic neuropathy  . 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 Spinal cord stimulator status   . Spinal stenosis   . Tobacco abuse    Past Surgical History:  Procedure Laterality Date  . ANKLE SURGERY    . CERVICAL CONE BIOPSY    . CESAREAN SECTION    . COLONOSCOPY    . HERNIA REPAIR    . KNEE SURGERY     x5  . ORIF ANKLE FRACTURE Left 06/02/2018   Procedure: OPEN REDUCTION INTERNAL FIXATION (ORIF) ANKLE FRACTURE;  Surgeon: Wylene Simmer, MD;  Location: [facility];  Service: Orthopedics;  Laterality: Left;  . ORIF ANKLE FRACTURE Left 08/26/2018   Procedure: Left ankle syndesmosis OPEN REDUCTION INTERNAL FIXATION (ORIF)/ reconstruction of deltoid ligament;  Surgeon:  Wylene Simmer, MD;  Location: [place];  Service: Orthopedics;  Laterality: Left;  . PACEMAKER IMPLANT N/A 08/05/2018   Procedure: PACEMAKER IMPLANT;  Surgeon: Constance Haw, MD;  Location: [place] [facility];  Service: Cardiovascular;  Laterality: N/A;  . POLYPECTOMY    . SPINAL CORD STIMULATOR INSERTION N/A 05/07/2016   Procedure: LUMBAR SPINAL CORD STIMULATOR INSERTION;  Surgeon: Melina Schools, MD;  Location: [place];  Service: Orthopedics;  Laterality: N/A;     Current Outpatient Medications  Medication Sig Dispense Refill  .  albuterol (PROVENTIL HFA;VENTOLIN HFA) 108 (90 Base) MCG/ACT inhaler Inhale 2 puffs into the lungs every 6 (six) hours as needed for wheezing or shortness of breath.     Marland Kitchen apixaban (ELIQUIS) 5 MG TABS tablet Take 1 tablet (5 mg total) by mouth 2 (two) times daily. 180 tablet 3  . Biotin 1000 MCG CHEW Chew by mouth.    Marland Kitchen BREO ELLIPTA 200-25 MCG/INH AEPB Inhale 1 puff into the lungs daily.  11  . docusate sodium (COLACE) 100 MG capsule Take 1 capsule (100 mg total) by mouth 2 (two) times daily. While taking narcotic pain medicine. (Patient taking differently: Take 100 mg by mouth 2 (two) times daily as needed (constipation). While taking narcotic pain medicine.) 30 capsule 0  . dronedarone (MULTAQ) 400 MG tablet Take 1 tablet (400 mg total) by mouth 2 (two) times daily with a meal. 60 tablet 6  . famotidine (PEPCID) 20 MG tablet Take 20 mg by mouth 3 (three) times daily.  0  . fluticasone (FLONASE) 50 MCG/ACT nasal spray Place 2 sprays into both nostrils daily as needed for allergies (seasonal allergies).   11  . folic acid (FOLVITE) 1 MG tablet Take 1 mg by mouth daily.     . hydrochlorothiazide (HYDRODIURIL) 25 MG tablet Take 25 mg by mouth daily.  1  . ibuprofen (ADVIL,MOTRIN) 800 MG tablet Take 800 mg by mouth 3 (three) times daily.     Marland Kitchen L-Methylfolate-Algae-B12-B6 (METANX) 3-90.314-2-35 MG CAPS Take 1 capsule by mouth 2 (two) times daily.  5  . levothyroxine (SYNTHROID) 25 MCG tablet Take 25 mcg by mouth daily before breakfast.     . LINZESS 145 MCG CAPS capsule Take 145 mcg by mouth daily.     Marland Kitchen lisinopril (PRINIVIL,ZESTRIL) 2.5 MG tablet Take 1 tablet (2.5 mg total) by mouth daily. 90 tablet 3  . oxyCODONE-acetaminophen (PERCOCET) 10-325 MG tablet Take 1 tablet by mouth every 4 (four) hours as needed for pain. 60 tablet 0  . PAZEO 0.7 % SOLN Place 1 drop into both eyes daily as needed (allergies).   3  . pregabalin (LYRICA) 150 MG capsule Take 150 mg by mouth 3 (three) times daily.     .  rosuvastatin (CRESTOR) 10 MG tablet Take 10 mg by mouth every evening.     . senna (SENOKOT) 8.6 MG TABS tablet Take 2 tablets (17.2 mg total) by mouth 2 (two) times daily. (Patient taking differently: Take 2 tablets by mouth 2 (two) times daily as needed (constipation). ) 30 each 0  . Vitamin D, Ergocalciferol, (DRISDOL) 50000 units CAPS capsule Take 50,000 Units by mouth every Wednesday.      No current facility-administered medications for this visit.     Allergies:   Sulfamethoxazole and Vancomycin   Social History:  The patient  reports that she has been smoking cigarettes. She has a 30.00 pack-year smoking history. She has never used smokeless tobacco. She reports previous  alcohol use. She reports that she does not use drugs.   Family History:  The patient's She was adopted. Family history is unknown by patient.   ROS:  Please see the history of present illness.   Otherwise, review of systems is positive for, constipation, back pain.   All other systems are reviewed and negative.   PHYSICAL EXAM: VS:  BP 110/62   Pulse 72   Ht 5\' 9"  (1.753 m)   Wt 253 lb (114.8 kg)   LMP  (LMP Unknown)   BMI 37.36 kg/m  , BMI Body mass index is 37.36 kg/m. GEN: Well nourished, well developed, in no acute distress  HEENT: normal  Neck: no JVD, carotid bruits, or masses Cardiac: RRR; no murmurs, rubs, or gallops,no edema  Respiratory:  clear to auscultation bilaterally, normal work of breathing GI: soft, nontender, nondistended, + BS MS: no deformity or atrophy  Skin: warm and dry Neuro:  Strength and sensation are intact Psych: euthymic mood, full affect  EKG:  EKG is ordered today. Personal review of the ekg ordered shows sinus rhythm, LBBB  Recent Labs: 06/02/2018: TSH 1.225 06/03/2018: ALT 16; Magnesium 1.9 07/09/2018: Platelets 161 08/26/2018: BUN 20; Creatinine, Ser 0.70; Hemoglobin 13.6; Potassium 3.7; Sodium 144    Lipid Panel     Component Value Date/Time   CHOL 119  06/02/2018 0325   TRIG 104 06/02/2018 0325   HDL 39 (L) 06/02/2018 0325   CHOLHDL 3.1 06/02/2018 0325   VLDL 21 06/02/2018 0325   LDLCALC 59 06/02/2018 0325     Wt Readings from Last 3 Encounters:  11/05/18 253 lb (114.8 kg)  10/28/18 257 lb 1.9 oz (116.6 kg)  08/26/18 253 lb 8.5 oz (115 kg)      Other studies Reviewed: Additional studies/ records that were reviewed today include: TTE 06/01/18  Review of the above records today demonstrates:  - Left ventricle: Mild diffuse hypo kinesis with abnormal septal   motion likely from LBBB. The cavity size was mildly dilated. Wall   thickness was normal. Systolic function was mildly reduced. The   estimated ejection fraction was in the range of 45% to 50%. Left   ventricular diastolic function parameters were normal. - Mitral valve: Calcified annulus. Mildly thickened leaflets . - Left atrium: The atrium was moderately dilated. - Right ventricle: The cavity size was mildly dilated. - Right atrium: The atrium was moderately dilated. - Atrial septum: No defect or patent foramen ovale was identified.  Myoview 07/29/18  The left ventricular ejection fraction is moderately decreased (30-44%).  Nuclear stress EF is calculated at 43% but appears better visually. There is paradoxical septal motion due to LBBB  There was no ST segment deviation noted during stress.  There is a medium defect of moderate severity present in the mid anteroseptal, apical anterior, apical septal and apex location. The defect is non-reversible and likely related to breast attenuation artifact. No ischemia noted.  This is a low risk study.  30 day monitor 07/15/18 - personally reviewed NSR PAF rapid rates 160 Conversion pauses greater than 3 seconds  ASSESSMENT AND PLAN:  1.  Paroxysmal atrial fibrillation: He on Eliquis and Multitak.  She is not aware of her atrial fibrillation.  She has had 2 short episodes, less than 1 hour.  She is tolerating it well.  No  changes at this time.    This patients CHA2DS2-VASc Score and unadjusted Ischemic Stroke Rate (% per year) is equal to 2.2 % stroke rate/year from a  score of 2  Above score calculated as 1 point each if present [CHF, HTN, DM, Vascular=MI/PAD/Aortic Plaque, Age if 65-74, or Female] Above score calculated as 2 points each if present [Age > 75, or Stroke/TIA/TE]  2.  Systolic heart failure: Fraction 45 to 50%.  No changes.  Plan per primary cardiology.  3.  Coronary atherosclerosis: Myoview read as low risk.  No changes.  4.  Tachybradycardia syndrome: Status post [place] pacemaker 08/05/2018.  Device functioning appropriately.  No changes at this time.  Current medicines are reviewed at length with the patient today.   The patient does not have concerns regarding her medicines.  The following changes were made today: None  Labs/ tests ordered today include:  Orders Placed This Encounter  Procedures  . EKG 12-Lead    Disposition:   FU with Will Camnitz 6 months  Signed, Will Meredith [place], MD  11/05/2018 4:04 PM     [place] [address] [phone] (office) [phone] (fax)

## 2018-11-05 NOTE — Patient Instructions (Signed)
Medication Instructions:  Your physician recommends that you continue on your current medications as directed. Please refer to the Current Medication list given to you today.  *If you need a refill on your cardiac medications before your next appointment, please call your pharmacy*  Labwork: None ordered  Testing/Procedures: None ordered  Follow-Up: Remote monitoring is used to monitor your Pacemaker or ICD from home. This monitoring reduces the number of office visits required to check your device to one time per year. It allows [place] to keep an eye on the functioning of your device to ensure it is working properly. You are scheduled for a device check from home on 02/07/2019. You may send your transmission at any time that day. If you have a wireless device, the transmission will be sent automatically. After your physician reviews your transmission, you will receive a postcard with your next transmission date.  Your physician wants you to follow-up in: 6 months with Dr. Curt Bears.  You will receive a reminder letter in the mail two months in advance. If you don't receive a letter, please call our office to schedule the follow-up appointment.  Thank you for choosing [facility]!!   Trinidad Curet, RN [phone]

## 2018-11-09 LAB — CUP PACEART INCLINIC DEVICE CHECK
Date Time Interrogation Session: 20200204105630
Implantable Lead Implant Date: 20191031
Implantable Lead Implant Date: 20191031
Implantable Lead Location: 753859
Implantable Lead Location: 753860
Implantable Pulse Generator Implant Date: 20191031
Pulse Gen Model: 2272
Pulse Gen Serial Number: 9079034

## 2018-11-16 ENCOUNTER — Other Ambulatory Visit: Payer: BC Managed Care – PPO

## 2018-11-16 DIAGNOSIS — I5020 Unspecified systolic (congestive) heart failure: Secondary | ICD-10-CM

## 2018-11-16 DIAGNOSIS — I480 Paroxysmal atrial fibrillation: Secondary | ICD-10-CM

## 2018-11-16 LAB — BASIC METABOLIC PANEL
BUN/Creatinine Ratio: 19 (ref 9–23)
BUN: 17 mg/dL (ref 6–24)
CALCIUM: 9.6 mg/dL (ref 8.7–10.2)
CO2: 22 mmol/L (ref 20–29)
CREATININE: 0.89 mg/dL (ref 0.57–1.00)
Chloride: 102 mmol/L (ref 96–106)
GFR, EST AFRICAN AMERICAN: 83 mL/min/{1.73_m2} (ref >59)
GFR, EST NON AFRICAN AMERICAN: 72 mL/min/{1.73_m2} (ref >59)
Glucose: 113 mg/dL — ABNORMAL HIGH (ref 65–99)
POTASSIUM: 4.1 mmol/L (ref 3.5–5.2)
Sodium: 140 mmol/L (ref 134–144)

## 2018-12-27 ENCOUNTER — Other Ambulatory Visit: Payer: Self-pay

## 2018-12-27 MED ORDER — DRONEDARONE HCL 400 MG PO TABS: 400.0000 mg | ORAL_TABLET | Freq: Two times a day (BID) | ORAL | 10 refills | Status: DC

## 2019-02-14 ENCOUNTER — Other Ambulatory Visit: Payer: Self-pay

## 2019-02-14 ENCOUNTER — Ambulatory Visit (INDEPENDENT_AMBULATORY_CARE_PROVIDER_SITE_OTHER): Payer: BC Managed Care – PPO | Admitting: *Deleted

## 2019-02-14 DIAGNOSIS — I480 Paroxysmal atrial fibrillation: Secondary | ICD-10-CM | POA: Diagnosis not present

## 2019-02-14 DIAGNOSIS — I5020 Unspecified systolic (congestive) heart failure: Secondary | ICD-10-CM

## 2019-02-15 LAB — CUP PACEART REMOTE DEVICE CHECK
Date Time Interrogation Session: 20200512120528
Implantable Lead Implant Date: 20191031
Implantable Lead Implant Date: 20191031
Implantable Lead Location: 753859
Implantable Lead Location: 753860
Implantable Pulse Generator Implant Date: 20191031
Pulse Gen Model: 2272
Pulse Gen Serial Number: 9079034

## 2019-02-22 NOTE — Progress Notes (Signed)
Remote pacemaker transmission.   

## 2019-04-04 NOTE — Progress Notes (Signed)
Virtual Visit via Video Note   This visit type was conducted due to national recommendations for restrictions regarding the COVID-19 Pandemic (e.g. social distancing) in an effort to limit this patient's exposure and mitigate transmission in our community.  Due to her co-morbid illnesses, this patient is at least at moderate risk for complications without adequate follow up.  This format is felt to be most appropriate for this patient at this time.  All issues noted in this document were discussed and addressed.  A limited physical exam was performed with this format.  Please refer to the patient's chart for her consent to telehealth for [facility].   Date:  04/06/2019   ID:  [name], DOB 02/23/1961, MRN [id number]  Patient Location: Home Provider Location: Office  PCP:  Aletha Halim PA-C  Cardiologist:   Johnsie Cancel Electrophysiologist:  None   Evaluation Performed:  Follow-Up Visit  Chief Complaint:  Bradycardia / PPM  History of Present Illness:    [name] is a 58 y.o. female  a history of tobacco abuse, alcohol abuse, cirrhosis, B12 deficiency, aortic atherosclerosis, and coronary disease by CT scan, COPD, hypothyroidism, hyperlipidemia. Had pre syncope with  Tachy brady syndrome and LBBB Broke her right foot Subsequently had surgery on her left foot by Dr Doran Durand. Eliquis held for 3 days prior  Seen by Dr Curt Bears and PPM placed on 07/3118 [facility] Jude Device not pacer dependant. She was placed on eliquis for PAF. TTE 06/01/18 suggested EF was 45-50% with moderate LAE.   No history of CAD Myovue done 07/29/18 low risk breast attenuation no ischemia EF estimated at 43% Started on low dose ACE in January 2020   Has had URI for a week COVID negative Needed multiple revisions on foot surgery   The patient  does not have symptoms concerning for COVID-19 infection (fever, chills, cough, or new shortness of breath).    Past Medical History:  Diagnosis Date   Alcohol  abuse    a. quit several years ago.   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   pacemaker insertion for tachy/brady syndrome   Spinal cord stimulator status    Spinal stenosis    Tobacco abuse    Past Surgical History:  Procedure Laterality Date   ANKLE SURGERY     CERVICAL CONE BIOPSY     CESAREAN SECTION     COLONOSCOPY     HERNIA REPAIR     KNEE SURGERY     x5   ORIF ANKLE FRACTURE Left 06/02/2018   Procedure: OPEN REDUCTION INTERNAL FIXATION (ORIF) ANKLE FRACTURE;  Surgeon: Wylene Simmer, MD;  [facility];  Service: Orthopedics;  Laterality: Left;   ORIF ANKLE FRACTURE Left 08/26/2018   Procedure: Left ankle syndesmosis OPEN REDUCTION INTERNAL FIXATION (ORIF)/ reconstruction of deltoid ligament;  Surgeon: Wylene Simmer, MD;  Location: [place];  Service: Orthopedics;  Laterality: Left;  PACEMAKER IMPLANT N/A 08/05/2018   Procedure: PACEMAKER IMPLANT;  Surgeon: Constance Haw, MD;  Location: [place] [facility];  Service: Cardiovascular;  Laterality: N/A;   POLYPECTOMY     SPINAL CORD STIMULATOR INSERTION N/A 05/07/2016   Procedure: LUMBAR SPINAL CORD STIMULATOR INSERTION;  Surgeon: Melina Schools, MD;  Location: [place];  Service: Orthopedics;  Laterality: N/A;     Current Meds  Medication Sig   albuterol (PROVENTIL HFA;VENTOLIN HFA) 108 (90 Base) MCG/ACT inhaler Inhale 2 puffs into the lungs every 6 (six) hours as needed for wheezing or shortness of breath.      apixaban (ELIQUIS) 5 MG TABS tablet Take 1 tablet (5 mg total) by mouth 2 (two) times daily.   Biotin 1000 MCG CHEW Chew by mouth.   BREO ELLIPTA 200-25 MCG/INH AEPB Inhale 1 puff into the lungs daily.   dronedarone (MULTAQ) 400 MG tablet Take 1 tablet (400 mg total) by mouth 2 (two) times daily with a meal.   famotidine (PEPCID) 20 MG tablet Take 20 mg by mouth 3 (three) times daily.   fluticasone (FLONASE) 50 MCG/ACT nasal spray Place 2 sprays into both nostrils daily as needed for allergies (seasonal allergies).    folic acid (FOLVITE) 1 MG tablet Take 1 mg by mouth daily.    hydrochlorothiazide (HYDRODIURIL) 25 MG tablet Take 25 mg by mouth daily.   L-Methylfolate-Algae-B12-B6 (METANX) 3-90.314-2-35 MG CAPS Take 1 capsule by mouth 2 (two) times daily.   levothyroxine (SYNTHROID) 25 MCG tablet Take 25 mcg by mouth daily before breakfast.    LINZESS 145 MCG CAPS capsule Take 145 mcg by mouth daily.    lisinopril (PRINIVIL,ZESTRIL) 2.5 MG tablet Take 1 tablet (2.5 mg total) by mouth daily.   oxyCODONE-acetaminophen (PERCOCET) 10-325 MG tablet Take 1 tablet by mouth every 4 (four) hours as needed for pain.   PAZEO 0.7 % SOLN Place 1 drop into both eyes daily as needed (allergies).    pregabalin (LYRICA) 150 MG capsule Take 150 mg by mouth 3 (three) times daily.    rosuvastatin (CRESTOR) 10 MG tablet Take 10 mg by mouth every evening.    senna (SENOKOT) 8.6 MG TABS tablet Take 2 tablets (17.2 mg total) by mouth 2 (two) times daily. (Patient taking differently: Take 2 tablets by mouth 2 (two) times daily as needed (constipation). )   Vitamin D, Ergocalciferol, (DRISDOL) 50000 units CAPS capsule Take 50,000 Units by mouth every Wednesday.      Allergies:   Sulfamethoxazole and Vancomycin   Social History   Tobacco Use   Smoking status: Current Every Day Smoker    Packs/day: 1.00    Years: 30.00    Pack years: 30.00    Types: Cigarettes   Smokeless tobacco: Never Used   Substance Use Topics   Alcohol use: Not Currently    Comment: Prior alcohol abuse - quit several years ago.   Drug use: No     Family Hx: The patient's She was adopted. Family history is unknown by patient.  ROS:   Please see the history of present illness.     All other systems reviewed and are negative.   Prior CV studies:    Additional studies/ records that were reviewed today include: TTE 06/01/18  Review of the above records today demonstrates:  - Left ventricle: Mild diffuse hypo kinesis with abnormal septal motion likely from LBBB. The cavity size was mildly dilated. Wall thickness was normal. Systolic function was mildly reduced. The estimated ejection fraction  was in the range of 45% to 50%. Left ventricular diastolic function parameters were normal. - Mitral valve: Calcified annulus. Mildly thickened leaflets . - Left atrium: The atrium was moderately dilated. - Right ventricle: The cavity size was mildly dilated. - Right atrium: The atrium was moderately dilated. - Atrial septum: No defect or patent foramen ovale was identified.  Myoview 07/29/18  The left ventricular ejection fraction is moderately decreased (30-44%).  Nuclear stress EF is calculated at 43% but appears better visually. There is paradoxical septal motion due to LBBB  There was no ST segment deviation noted during stress.  There is a medium defect of moderate severity present in the mid anteroseptal, apical anterior, apical septal and apex location. The defect is non-reversible and likely related to breast attenuation artifact. No ischemia noted.  This is a low risk study.  30 day monitor 07/15/18 - personally reviewed NSR PAF rapid rates 160 Conversion pauses greater than 3 seconds  Labs/Other Tests and Data Reviewed:    EKG:  Not done this visit   Recent Labs: 06/02/2018: TSH 1.225 06/03/2018: ALT 16; Magnesium 1.9 07/09/2018: Platelets 161 08/26/2018: Hemoglobin  13.6 11/16/2018: BUN 17; Creatinine, Ser 0.89; Potassium 4.1; Sodium 140   Recent Lipid Panel Lab Results  Component Value Date/Time   CHOL 119 06/02/2018 03:25 AM   TRIG 104 06/02/2018 03:25 AM   HDL 39 (L) 06/02/2018 03:25 AM   CHOLHDL 3.1 06/02/2018 03:25 AM   LDLCALC 59 06/02/2018 03:25 AM    Wt Readings from Last 3 Encounters:  04/06/19 253 lb (114.8 kg)  11/05/18 253 lb (114.8 kg)  10/28/18 257 lb 1.9 oz (116.6 kg)     Objective:    Vital Signs:  Ht 5\' 9"  (1.753 m)    Wt 253 lb (114.8 kg)    LMP  (LMP Unknown)    BMI 37.36 kg/m    Skin warm and dry No distress No tachypnea No JVP elevation  Neuro appears non focal No edema   ASSESSMENT & PLAN:    1.  Paroxysmal atrial fibrillation: Currently on Eliquis and Multaq.  CHADVASC 2 maintaining NSR with atrial pacing   2.  Systolic heart failure: Ejection fraction 45 to 50%.  Tolerating low dose ACE non ischemic myovue 07/29/18  3.  Coronary atherosclerosis: Found on CT scan 05/2018. Non ischemic myovue 07/27/18 no chest pain observe  4. PPM:  Normal function f/u with Dr Curt Bears  5. Ortho : post left foot surgery f/u with Dr Doran Durand   COVID-19 Education: The signs and symptoms of COVID-19 were discussed with the patient and how to seek care for testing (follow up with PCP or arrange E-visit).  The importance of social distancing was discussed today.  Time:   Today, I have spent 30 minutes with the patient with telehealth technology discussing the above problems.     Medication Adjustments/Labs and Tests Ordered: Current medicines are reviewed at length with the patient today.  Concerns regarding medicines are outlined above.   Tests Ordered:  None   Medication Changes:  None   Disposition:  Follow up with Camnitz/EP 6 months me in a year  Signed, Jenkins Rouge, MD  04/06/2019 9:48 AM    [place]

## 2019-04-05 ENCOUNTER — Telehealth: Payer: Self-pay | Admitting: Cardiovascular Disease

## 2019-04-05 NOTE — Telephone Encounter (Signed)
New message        Virtual Visit Pre-Appointment Phone Call  "(Name), I am calling you today to discuss your upcoming appointment. We are currently trying to limit exposure to the virus that causes COVID-19 by seeing patients at home rather than in the office."   1. Confirm consent - "In the setting of the current Covid19 crisis, you are scheduled for a (phone or video) visit with your provider on (date) at (time).  Just as we do with many in-office visits, in order for you to participate in this visit, we must obtain consent.  If you'd like, I can send this to your mychart (if signed up) or email for you to review.  Otherwise, I can obtain your verbal consent now.  All virtual visits are billed to your insurance company just like a normal visit would be.  By agreeing to a virtual visit, we'd like you to understand that the technology does not allow for your provider to perform an examination, and thus may limit your provider's ability to fully assess your condition. If your provider identifies any concerns that need to be evaluated in person, we will make arrangements to do so.  Finally, though the technology is pretty good, we cannot assure that it will always work on either your or our end, and in the setting of a video visit, we may have to convert it to a phone-only visit.  In either situation, we cannot ensure that we have a secure connection.  Are you willing to proceed?" STAFF: Did the patient verbally acknowledge consent to telehealth visit? Document YES/NO here: YES       FULL LENGTH CONSENT FOR TELE-HEALTH VISIT   I hereby voluntarily request, consent and authorize [facility] and its employed or contracted physicians, physician assistants, nurse practitioners or other licensed health care professionals (the Practitioner), to provide me with telemedicine health care services (the Services") as deemed necessary by the treating Practitioner. I acknowledge and consent to receive the  Services by the Practitioner via telemedicine. I understand that the telemedicine visit will involve communicating with the Practitioner through live audiovisual communication technology and the disclosure of certain medical information by electronic transmission. I acknowledge that I have been given the opportunity to request an in-person assessment or other available alternative prior to the telemedicine visit and am voluntarily participating in the telemedicine visit.  I understand that I have the right to withhold or withdraw my consent to the use of telemedicine in the course of my care at any time, without affecting my right to future care or treatment, and that the Practitioner or I may terminate the telemedicine visit at any time. I understand that I have the right to inspect all information obtained and/or recorded in the course of the telemedicine visit and may receive copies of available information for a reasonable fee.  I understand that some of the potential risks of receiving the Services via telemedicine include:   Delay or interruption in medical evaluation due to technological equipment failure or disruption;  Information transmitted may not be sufficient (e.g. poor resolution of images) to allow for appropriate medical decision making by the Practitioner; and/or   In rare instances, security protocols could fail, causing a breach of personal health information.  Furthermore, I acknowledge that it is my responsibility to provide information about my medical history, conditions and care that is complete and accurate to the best of my ability. I acknowledge that Practitioner's advice, recommendations, and/or decision may be  based on factors not within their control, such as incomplete or inaccurate data provided by me or distortions of diagnostic images or specimens that may result from electronic transmissions. I understand that the practice of medicine is not an exact science and that  Practitioner makes no warranties or guarantees regarding treatment outcomes. I acknowledge that I will receive a copy of this consent concurrently upon execution via email to the email address I last provided but may also request a printed copy by calling the office of [facility].    I understand that my insurance will be billed for this visit.   I have read or had this consent read to me.  I understand the contents of this consent, which adequately explains the benefits and risks of the Services being provided via telemedicine.   I have been provided ample opportunity to ask questions regarding this consent and the Services and have had my questions answered to my satisfaction.  I give my informed consent for the services to be provided through the use of telemedicine in my medical care  By participating in this telemedicine visit I agree to the above.

## 2019-04-06 ENCOUNTER — Other Ambulatory Visit: Payer: Self-pay

## 2019-04-06 ENCOUNTER — Telehealth (INDEPENDENT_AMBULATORY_CARE_PROVIDER_SITE_OTHER): Payer: BC Managed Care – PPO | Admitting: Cardiovascular Disease

## 2019-04-06 ENCOUNTER — Encounter: Payer: Self-pay | Admitting: Cardiovascular Disease

## 2019-04-06 VITALS — Ht 69.0 in | Wt 253.0 lb

## 2019-04-06 DIAGNOSIS — I480 Paroxysmal atrial fibrillation: Secondary | ICD-10-CM

## 2019-04-06 NOTE — Patient Instructions (Addendum)
Medication Instructions:  Your physician recommends that you continue on your current medications as directed. Please refer to the Current Medication list given to you today.  If you need a refill on your cardiac medications before your next appointment, please call your pharmacy.    Lab work: None Ordered    Testing/Procedures: None Ordered   Follow-Up: Your physician recommends that you schedule a follow-up appointment in: 6 months with Dr. Curt Bears   At [facility], you and your health needs are our priority.  As part of our continuing mission to provide you with exceptional heart care, we have created designated Provider Care Teams.  These Care Teams include your primary Cardiologist (physician) and Advanced Practice Providers (APPs -  Physician Assistants and Nurse Practitioners) who all work together to provide you with the care you need, when you need it. You will need a follow up appointment in 1 years.  Please call our office 2 months in advance to schedule this appointment.  You may see Jenkins Rouge, MD or one of the following Advanced Practice Providers on your designated Care Team:   Truitt Merle, NP Cecilie Kicks, NP . Kathyrn Drown, NP

## 2019-04-27 ENCOUNTER — Telehealth: Payer: BC Managed Care – PPO | Admitting: Cardiovascular Disease

## 2019-04-27 ENCOUNTER — Ambulatory Visit: Payer: BC Managed Care – PPO | Admitting: Cardiovascular Disease

## 2019-05-16 ENCOUNTER — Ambulatory Visit (INDEPENDENT_AMBULATORY_CARE_PROVIDER_SITE_OTHER): Payer: BC Managed Care – PPO | Admitting: *Deleted

## 2019-05-16 DIAGNOSIS — I495 Sick sinus syndrome: Secondary | ICD-10-CM

## 2019-05-16 DIAGNOSIS — I480 Paroxysmal atrial fibrillation: Secondary | ICD-10-CM

## 2019-05-16 LAB — CUP PACEART REMOTE DEVICE CHECK
Battery Remaining Longevity: 124 mo
Battery Remaining Percentage: 95.5 %
Battery Voltage: 3.01 V
Brady Statistic AP VP Percent: 1 %
Brady Statistic AP VS Percent: 57 %
Brady Statistic AS VP Percent: 1 %
Brady Statistic AS VS Percent: 42 %
Brady Statistic RA Percent Paced: 56 %
Brady Statistic RV Percent Paced: 1.1 %
Date Time Interrogation Session: 20200810060014
Implantable Lead Implant Date: 20191031
Implantable Lead Implant Date: 20191031
Implantable Lead Location: 753859
Implantable Lead Location: 753860
Implantable Pulse Generator Implant Date: 20191031
Lead Channel Impedance Value: 460 Ohm
Lead Channel Impedance Value: 560 Ohm
Lead Channel Pacing Threshold Amplitude: 0.5 V
Lead Channel Pacing Threshold Amplitude: 0.5 V
Lead Channel Pacing Threshold Pulse Width: 0.5 ms
Lead Channel Pacing Threshold Pulse Width: 0.5 ms
Lead Channel Sensing Intrinsic Amplitude: 12 mV
Lead Channel Sensing Intrinsic Amplitude: 4 mV
Lead Channel Setting Pacing Amplitude: 0.75 V
Lead Channel Setting Pacing Amplitude: 1.5 V
Lead Channel Setting Pacing Pulse Width: 0.5 ms
Lead Channel Setting Sensing Sensitivity: 2 mV
Pulse Gen Model: 2272
Pulse Gen Serial Number: 9079034

## 2019-05-24 NOTE — Progress Notes (Signed)
Remote pacemaker transmission.   

## 2019-06-24 ENCOUNTER — Other Ambulatory Visit: Payer: Self-pay

## 2019-06-24 MED ORDER — APIXABAN 5 MG PO TABS: 5.0000 mg | ORAL_TABLET | Freq: Two times a day (BID) | ORAL | 1 refills | Status: DC

## 2019-06-24 NOTE — Telephone Encounter (Signed)
Pt last saw Dr Johnsie Cancel 04/06/19 telemedicine Covid-19, last labs 11/16/18 Creat 0.89, age 58, weight 114.8kg, based on specified criteria pt is on appropriate dosage of Eliquis 5mg  BID.  Will refill rx.

## 2019-08-15 ENCOUNTER — Ambulatory Visit (INDEPENDENT_AMBULATORY_CARE_PROVIDER_SITE_OTHER): Payer: BC Managed Care – PPO | Admitting: *Deleted

## 2019-08-15 DIAGNOSIS — I495 Sick sinus syndrome: Secondary | ICD-10-CM | POA: Diagnosis not present

## 2019-08-15 DIAGNOSIS — I480 Paroxysmal atrial fibrillation: Secondary | ICD-10-CM

## 2019-08-16 LAB — CUP PACEART REMOTE DEVICE CHECK
Battery Remaining Longevity: 122 mo
Battery Remaining Percentage: 95.5 %
Battery Voltage: 3.01 V
Brady Statistic AP VP Percent: 1 %
Brady Statistic AP VS Percent: 54 %
Brady Statistic AS VP Percent: 1 %
Brady Statistic AS VS Percent: 46 %
Brady Statistic RA Percent Paced: 52 %
Brady Statistic RV Percent Paced: 1.3 %
Date Time Interrogation Session: 20201109070033
Implantable Lead Implant Date: 20191031
Implantable Lead Implant Date: 20191031
Implantable Lead Location: 753859
Implantable Lead Location: 753860
Implantable Pulse Generator Implant Date: 20191031
Lead Channel Impedance Value: 480 Ohm
Lead Channel Impedance Value: 560 Ohm
Lead Channel Pacing Threshold Amplitude: 0.5 V
Lead Channel Pacing Threshold Amplitude: 0.5 V
Lead Channel Pacing Threshold Pulse Width: 0.5 ms
Lead Channel Pacing Threshold Pulse Width: 0.5 ms
Lead Channel Sensing Intrinsic Amplitude: 12 mV
Lead Channel Sensing Intrinsic Amplitude: 5 mV
Lead Channel Setting Pacing Amplitude: 0.75 V
Lead Channel Setting Pacing Amplitude: 1.5 V
Lead Channel Setting Pacing Pulse Width: 0.5 ms
Lead Channel Setting Sensing Sensitivity: 2 mV
Pulse Gen Model: 2272
Pulse Gen Serial Number: 9079034

## 2019-08-22 ENCOUNTER — Other Ambulatory Visit: Payer: Self-pay

## 2019-08-22 ENCOUNTER — Encounter: Payer: Self-pay | Admitting: Cardiology

## 2019-08-22 ENCOUNTER — Ambulatory Visit: Payer: BC Managed Care – PPO | Admitting: Cardiology

## 2019-08-22 VITALS — BP 120/68 | HR 102 | Ht 69.0 in | Wt 246.2 lb

## 2019-08-22 DIAGNOSIS — I480 Paroxysmal atrial fibrillation: Secondary | ICD-10-CM

## 2019-08-22 DIAGNOSIS — I495 Sick sinus syndrome: Secondary | ICD-10-CM

## 2019-08-22 NOTE — Patient Instructions (Signed)
Medication Instructions:  Your physician recommends that you continue on your current medications as directed. Please refer to the Current Medication list given to you today.  * If you need a refill on your cardiac medications before your next appointment, please call your pharmacy.   Labwork: None ordered  Testing/Procedures: None ordered  Follow-Up: At [facility], you and your health needs are our priority.  As part of our continuing mission to provide you with exceptional heart care, we have created designated Provider Care Teams.  These Care Teams include your primary Cardiologist (physician) and Advanced Practice Providers (APPs -  Physician Assistants and Nurse Practitioners) who all work together to provide you with the care you need, when you need it.  You will need a follow up appointment in 6 months.  Please call our office 2 months in advance to schedule this appointment.  You may see Dr Curt Bears or one of the following Advanced Practice Providers on your designated Care Team:    Chanetta Marshall, NP  Tommye Standard, PA-C  Oda Kilts, [place]  Thank you for choosing [facility]!!   Trinidad Curet, RN [phone]  Any Other Special Instructions Will Be Listed Below (If Applicable).  Cardiac Ablation Cardiac ablation is a procedure to disable (ablate) a small amount of heart tissue in very specific places. The heart has many electrical connections. Sometimes these connections are abnormal and can cause the heart to beat very fast or irregularly. Ablating some of the problem areas can improve the heart rhythm or return it to normal. Ablation may be done for people who:  Have Wolff-Parkinson-White syndrome.  Have fast heart rhythms (tachycardia).  Have taken medicines for an abnormal heart rhythm (arrhythmia) that were not effective or caused side effects.  Have a high-risk heartbeat that may be life-threatening. During the procedure, a small incision is made in the neck  or the groin, and a long, thin, flexible tube (catheter) is inserted into the incision and moved to the heart. Small devices (electrodes) on the tip of the catheter will send out electrical currents. A type of X-ray (fluoroscopy) will be used to help guide the catheter and to provide images of the heart. Tell a health care provider about:  Any allergies you have.  All medicines you are taking, including vitamins, herbs, eye drops, creams, and over-the-counter medicines.  Any problems you or family members have had with anesthetic medicines.  Any blood disorders you have.  Any surgeries you have had.  Any medical conditions you have, such as kidney failure.  Whether you are pregnant or may be pregnant. What are the risks? Generally, this is a safe procedure. However, problems may occur, including:  Infection.  Bruising and bleeding at the catheter insertion site.  Bleeding into the chest, especially into the sac that surrounds the heart. This is a serious complication.  Stroke or blood clots.  Damage to other structures or organs.  Allergic reaction to medicines or dyes.  Need for a permanent pacemaker if the normal electrical system is damaged. A pacemaker is a small computer that sends electrical signals to the heart and helps your heart beat normally.  The procedure not being fully effective. This may not be recognized until months later. Repeat ablation procedures are sometimes required. What happens before the procedure?  Follow instructions from your health care provider about eating or drinking restrictions.  Ask your health care provider about: ? Changing or stopping your regular medicines. This is especially important if you are  taking diabetes medicines or blood thinners. ? Taking medicines such as aspirin and ibuprofen. These medicines can thin your blood. Do not take these medicines before your procedure if your health care provider instructs you not to.  Plan to  have someone take you home from the hospital or clinic.  If you will be going home right after the procedure, plan to have someone with you for 24 hours. What happens during the procedure?  To lower your risk of infection: ? Your health care team will wash or sanitize their hands. ? Your skin will be washed with soap. ? Hair may be removed from the incision area.  An IV tube will be inserted into one of your veins.  You will be given a medicine to help you relax (sedative).  The skin on your neck or groin will be numbed.  An incision will be made in your neck or your groin.  A needle will be inserted through the incision and into a large vein in your neck or groin.  A catheter will be inserted into the needle and moved to your heart.  Dye may be injected through the catheter to help your surgeon see the area of the heart that needs treatment.  Electrical currents will be sent from the catheter to ablate heart tissue in desired areas. There are three types of energy that may be used to ablate heart tissue: ? Heat (radiofrequency energy). ? Laser energy. ? Extreme cold (cryoablation).  When the necessary tissue has been ablated, the catheter will be removed.  Pressure will be held on the catheter insertion area to prevent excessive bleeding.  A bandage (dressing) will be placed over the catheter insertion area. The procedure may vary among health care providers and hospitals. What happens after the procedure?  Your blood pressure, heart rate, breathing rate, and blood oxygen level will be monitored until the medicines you were given have worn off.  Your catheter insertion area will be monitored for bleeding. You will need to lie still for a few hours to ensure that you do not bleed from the catheter insertion area.  Do not drive for 24 hours or as long as directed by your health care provider. Summary  Cardiac ablation is a procedure to disable (ablate) a small amount of  heart tissue in very specific places. Ablating some of the problem areas can improve the heart rhythm or return it to normal.  During the procedure, electrical currents will be sent from the catheter to ablate heart tissue in desired areas. This information is not intended to replace advice given to you by your health care provider. Make sure you discuss any questions you have with your health care provider. Document Released: 02/08/2009 Document Revised: 03/15/2018 Document Reviewed: 08/11/2016 Elsevier Patient Education  2020 [employer].

## 2019-08-22 NOTE — Progress Notes (Signed)
Electrophysiology Office Note   Date:  08/22/2019   ID:  [name], DOB Nov 05, 1960, MRN [id number]  PCP:  Aletha Halim., PA-C  Cardiologist:  Johnsie Cancel Primary Electrophysiologist:  Keaghan Staton Meredith Leeds, MD    No chief complaint on file.    History of Present Illness: [name] is a 58 y.o. 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 Presenting today for electrophysiology evaluation.  She has a history of tobacco abuse, alcohol abuse, cirrhosis, B12 deficiency, aortic atherosclerosis, and coronary disease by CT scan, COPD, hypothyroidism, hyperlipidemia.  She presented to the hospital in August 2019 with a brown out episode.  The morning of the event, she woke up feeling well at 4 AM prior to going to work.  She felt more tired than usual.  She works in a [employer].  She was standing in line when she felt like a table that collapsed.  Her legs went underneath her.  She did not trip or lose her balance.  She nearly passed out but did not completely lose consciousness.  In the emergency room she was noted to be intermittently bradycardic into the mid 40s as well as tachycardic into the 120s which appeared to be atrial flutter/fibrillation.  She was noted to have atrial fibrillation and atrial flutter while she was hospitalized.  She had intermittent 3.4-second pauses.  She was put on IV amiodarone during her hospitalization.  She was started on Eliquis.  She was discharged without rhythm control.  She is now status post [place] dual-chamber pacemaker implanted 07/28/2018.  Today, denies symptoms of palpitations, chest pain, shortness of breath, orthopnea, PND, lower extremity edema, claudication, dizziness, presyncope, syncope, bleeding, or neurologic sequela. The patient is tolerating medications without difficulties.  Overall she is doing well.  She continues to have intermittent episodes of  weakness and fatigue that are likely caused by atrial fibrillation.  She had ankle surgery, but she is up and walking now without issue.  Past Medical History:  Diagnosis Date  . Alcohol abuse    a. quit several years ago.  . Allergy   . Anemia    younger  . Arthritis    feet,knees  . B12 deficiency   . BPPV (benign paroxysmal positional vertigo)   . Cirrhosis ([place])   . Complication of anesthesia    "slow to wake"  . Constipation    on movantik- stools regular and soft on this  . COPD (chronic obstructive pulmonary disease) ([place])   . Foot drop   . GERD (gastroesophageal reflux disease)   . Hyperlipidemia   . Hypothyroidism   . Neuromuscular disorder (HCC)    idiopathic neuropathy  . 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 Spinal cord stimulator status   . Spinal stenosis   . Tobacco abuse    Past Surgical History:  Procedure Laterality Date  . ANKLE SURGERY    . CERVICAL CONE BIOPSY    . CESAREAN SECTION    . COLONOSCOPY    . HERNIA REPAIR    . KNEE SURGERY     x5  . ORIF ANKLE FRACTURE Left 06/02/2018   Procedure: OPEN REDUCTION INTERNAL FIXATION (ORIF) ANKLE FRACTURE;  Surgeon: Wylene Simmer, MD;  Location: [facility];  Service: Orthopedics;  Laterality: Left;  . ORIF ANKLE FRACTURE Left 08/26/2018   Procedure: Left ankle syndesmosis OPEN REDUCTION INTERNAL FIXATION (  ORIF)/ reconstruction of deltoid ligament;  Surgeon: Wylene Simmer, MD;  Location: [facility];  Service: Orthopedics;  Laterality: Left;  . PACEMAKER IMPLANT N/A 08/05/2018   Procedure: PACEMAKER IMPLANT;  Surgeon: Constance Haw, MD;  Location: [facility];  Service: Cardiovascular;  Laterality: N/A;  . POLYPECTOMY    . SPINAL CORD STIMULATOR INSERTION N/A 05/07/2016   Procedure: LUMBAR SPINAL CORD STIMULATOR INSERTION;  Surgeon: Melina Schools, MD;  Location: [facility];  Service: Orthopedics;  Laterality: N/A;     Current  Outpatient Medications  Medication Sig Dispense Refill  . albuterol (PROVENTIL HFA;VENTOLIN HFA) 108 (90 Base) MCG/ACT inhaler Inhale 2 puffs into the lungs every 6 (six) hours as needed for wheezing or shortness of breath.     Marland Kitchen apixaban (ELIQUIS) 5 MG TABS tablet Take 1 tablet (5 mg total) by mouth 2 (two) times daily. 180 tablet 1  . Biotin 1000 MCG CHEW Chew by mouth.    Marland Kitchen BREO ELLIPTA 200-25 MCG/INH AEPB Inhale 1 puff into the lungs daily.  11  . dronedarone (MULTAQ) 400 MG tablet Take 1 tablet (400 mg total) by mouth 2 (two) times daily with a meal. 60 tablet 10  . famotidine (PEPCID) 20 MG tablet Take 20 mg by mouth 3 (three) times daily.  0  . fluticasone (FLONASE) 50 MCG/ACT nasal spray Place 2 sprays into both nostrils daily as needed for allergies (seasonal allergies).   11  . folic acid (FOLVITE) 1 MG tablet Take 1 mg by mouth daily.     . hydrochlorothiazide (HYDRODIURIL) 25 MG tablet Take 25 mg by mouth daily.  1  . L-Methylfolate-Algae-B12-B6 (METANX) 3-90.314-2-35 MG CAPS Take 1 capsule by mouth 2 (two) times daily.  5  . levothyroxine (SYNTHROID) 25 MCG tablet Take 25 mcg by mouth daily before breakfast.     . LINZESS 145 MCG CAPS capsule Take 145 mcg by mouth daily.     Marland Kitchen lisinopril (PRINIVIL,ZESTRIL) 2.5 MG tablet Take 1 tablet (2.5 mg total) by mouth daily. 90 tablet 3  . oxyCODONE-acetaminophen (PERCOCET) 10-325 MG tablet Take 1 tablet by mouth every 4 (four) hours as needed for pain. 60 tablet 0  . PAZEO 0.7 % SOLN Place 1 drop into both eyes daily as needed (allergies).   3  . pregabalin (LYRICA) 150 MG capsule Take 150 mg by mouth 3 (three) times daily.     . rosuvastatin (CRESTOR) 10 MG tablet Take 10 mg by mouth every evening.     . Vitamin D, Ergocalciferol, (DRISDOL) 50000 units CAPS capsule Take 50,000 Units by mouth every Wednesday.      No current facility-administered medications for this visit.     Allergies:   Sulfamethoxazole and Vancomycin   Social  History:  The patient  reports that she has been smoking cigarettes. She has a 30.00 pack-year smoking history. She has never used smokeless tobacco. She reports previous alcohol use. She reports that she does not use drugs.   Family History:  The patient's She was adopted. Family history is unknown by patient.   ROS:  Please see the history of present illness.   Otherwise, review of systems is positive for none.   All other systems are reviewed and negative.   PHYSICAL EXAM: VS:  BP 120/68   Pulse (!) 102   Ht 5\' 9"  (1.753 m)   Wt 246 lb 3.2 oz (111.7 kg)   LMP  (LMP Unknown)   SpO2 94%   BMI 36.36  kg/m  , BMI Body mass index is 36.36 kg/m. GEN: Well nourished, well developed, in no acute distress  HEENT: normal  Neck: no JVD, carotid bruits, or masses Cardiac: RRR; no murmurs, rubs, or gallops,no edema  Respiratory:  clear to auscultation bilaterally, normal work of breathing GI: soft, nontender, nondistended, + BS MS: no deformity or atrophy  Skin: warm and dry, device site well healed Neuro:  Strength and sensation are intact Psych: euthymic mood, full affect  EKG:  EKG is ordered today. Personal review of the ekg ordered shows atrial flutter, rate 102  Personal review of the device interrogation today. Results in [place]: 08/26/2018: Hemoglobin 13.6 11/16/2018: BUN 17; Creatinine, Ser 0.89; Potassium 4.1; Sodium 140    Lipid Panel     Component Value Date/Time   CHOL 119 06/02/2018 0325   TRIG 104 06/02/2018 0325   HDL 39 (L) 06/02/2018 0325   CHOLHDL 3.1 06/02/2018 0325   VLDL 21 06/02/2018 0325   LDLCALC 59 06/02/2018 0325     Wt Readings from Last 3 Encounters:  08/22/19 246 lb 3.2 oz (111.7 kg)  04/06/19 253 lb (114.8 kg)  11/05/18 253 lb (114.8 kg)      Other studies Reviewed: Additional studies/ records that were reviewed today include: TTE 06/01/18  Review of the above records today demonstrates:  - Left ventricle: Mild diffuse hypo  kinesis with abnormal septal   motion likely from LBBB. The cavity size was mildly dilated. Wall   thickness was normal. Systolic function was mildly reduced. The   estimated ejection fraction was in the range of 45% to 50%. Left   ventricular diastolic function parameters were normal. - Mitral valve: Calcified annulus. Mildly thickened leaflets . - Left atrium: The atrium was moderately dilated. - Right ventricle: The cavity size was mildly dilated. - Right atrium: The atrium was moderately dilated. - Atrial septum: No defect or patent foramen ovale was identified.  Myoview 07/29/18  The left ventricular ejection fraction is moderately decreased (30-44%).  Nuclear stress EF is calculated at 43% but appears better visually. There is paradoxical septal motion due to LBBB  There was no ST segment deviation noted during stress.  There is a medium defect of moderate severity present in the mid anteroseptal, apical anterior, apical septal and apex location. The defect is non-reversible and likely related to breast attenuation artifact. No ischemia noted.  This is a low risk study.  30 day monitor 07/15/18 - personally reviewed NSR PAF rapid rates 160 Conversion pauses greater than 3 seconds  ASSESSMENT AND PLAN:  1.  Paroxysmal atrial fibrillation: Currently on Eliquis and Multaq.  She is having 4% atrial fibrillation.  She presented today in atrial flutter.  We were able to pace her out of atrial flutter.  She went into atrial fibrillation for a short time but since converted to normal rhythm.  I discussed with her options of continuing Multaq versus ablation.  Risks and benefits were discussed.  Risks include bleeding, tamponade, heart block, stroke, damage surrounding organs.  She understands these risks and would like to further consider her options.  This patients CHA2DS2-VASc Score and unadjusted Ischemic Stroke Rate (% per year) is equal to 2.2 % stroke rate/year from a score of 2   Above score calculated as 1 point each if present [CHF, HTN, DM, Vascular=MI/PAD/Aortic Plaque, Age if 65-74, or Female] Above score calculated as 2 points each if present [Age > 75, or Stroke/TIA/TE]   2.  Systolic heart failure: Ejection fraction 45 to 50%.  No signs of volume overload.  No changes.  3.  Coronary atherosclerosis: Low risk Myoview.  No changes.  4.  Tachybradycardia syndrome: Status post Saint Jude dual-chamber pacemaker implanted 08/05/2018.  Device functioning appropriately.  No changes.  Current medicines are reviewed at length with the patient today.   The patient does not have concerns regarding her medicines.  The following changes were made today: None  Labs/ tests ordered today include:  Orders Placed This Encounter  Procedures  . EKG 12-Lead    Disposition:   FU with Leidy Massar 6 months  Signed, Akhilesh Sassone Meredith Leeds, MD  08/22/2019 4:58 PM     [place] [address] [phone] (office) [phone] (fax)

## 2019-09-10 NOTE — Progress Notes (Signed)
Remote pacemaker transmission.   

## 2019-09-19 ENCOUNTER — Telehealth: Payer: Self-pay | Admitting: *Deleted

## 2019-09-19 NOTE — Telephone Encounter (Signed)
Spoke with patient. She reports that she is not interested in AF ablation at this time. Minimally symptomatic with episodes, reports she very rarely experiences palpitations. She has had multiple surgeries this year to repair a broken ankle and reports that she is "done" with surgical procedures for now. Patient agrees to call if she becomes more symptomatic with episodes. Merlin alert for AT/AF burden previously turned off, will continue to monitor remotely via scheduled transmissions.  Reports she recently started on escitalopram and this has helped immensely with her stress levels. Added to med list--received automated warning due to potential interaction with dronedarone (QT interval prolongation). Routed to Dr. Curt Bears and Venida Jarvis, RN for review.

## 2019-09-19 NOTE — Telephone Encounter (Signed)
LMOM requesting call back to DC. Direct number and office hours provided.  Merlin alert received for HVRs and AT/AF burden of 9.4% since 08/22/19. HVR episodes are due to AF w/RVR, longest available 6.15min. V rates during AT/AF not well controlled. Presenting rhythm as of 09/18/19 at 04;01 was AP/VS 60s. On Eliquis and Multaq. Pt was offered AF ablation at 08/22/19 OV with Dr. Curt Bears.

## 2019-09-19 NOTE — Telephone Encounter (Signed)
Pt returning [place] phone call. I told her Raquel Sarna is with a pt and will give her a call back.

## 2019-09-20 ENCOUNTER — Other Ambulatory Visit: Payer: Self-pay | Admitting: Cardiology

## 2019-09-20 NOTE — Telephone Encounter (Signed)
Patient needs ECG to check med interactions.

## 2019-09-22 NOTE — Telephone Encounter (Signed)
Pt agreeable to stopping by the office next week for EKG.

## 2019-09-28 ENCOUNTER — Ambulatory Visit: Payer: BC Managed Care – PPO

## 2019-10-03 ENCOUNTER — Other Ambulatory Visit: Payer: Self-pay

## 2019-10-03 ENCOUNTER — Ambulatory Visit (INDEPENDENT_AMBULATORY_CARE_PROVIDER_SITE_OTHER): Payer: BC Managed Care – PPO

## 2019-10-03 VITALS — BP 106/58 | HR 70 | Wt 237.0 lb

## 2019-10-03 DIAGNOSIS — I480 Paroxysmal atrial fibrillation: Secondary | ICD-10-CM | POA: Diagnosis not present

## 2019-10-03 NOTE — Progress Notes (Signed)
1.) Reason for visit: ECG to monitor for prolonged QT due to recent addition of Lexapro  2.) Name of MD requesting visit: *Dr. Curt Bears  3.) H&P: Pt on long term Multaq, recently started on Lexapro by PCP, medication interaction known. Pt having ECG today to monitor for possible prolonged Qt.**  4.) ROS related to problem: No cardiac complaints**  5.) Assessment and plan per MD: Dr. Rayann Heman reviewed ECG noted Sinus Rhytm with LBBB, QT stable, no changes.  ECG to be scanned reviewed by Dr. Curt Bears*

## 2019-10-03 NOTE — Patient Instructions (Signed)
No changes

## 2019-10-13 ENCOUNTER — Other Ambulatory Visit: Payer: Self-pay | Admitting: Cardiovascular Disease

## 2019-11-14 ENCOUNTER — Ambulatory Visit (INDEPENDENT_AMBULATORY_CARE_PROVIDER_SITE_OTHER): Payer: BC Managed Care – PPO | Admitting: *Deleted

## 2019-11-14 DIAGNOSIS — I495 Sick sinus syndrome: Secondary | ICD-10-CM | POA: Diagnosis not present

## 2019-11-14 LAB — CUP PACEART REMOTE DEVICE CHECK
Battery Remaining Longevity: 127 mo
Battery Remaining Percentage: 95.5 %
Battery Voltage: 3.01 V
Brady Statistic AP VP Percent: 1 %
Brady Statistic AP VS Percent: 67 %
Brady Statistic AS VP Percent: 1 %
Brady Statistic AS VS Percent: 32 %
Brady Statistic RA Percent Paced: 62 %
Brady Statistic RV Percent Paced: 1.9 %
Date Time Interrogation Session: 20210206040035
Implantable Lead Implant Date: 20191031
Implantable Lead Implant Date: 20191031
Implantable Lead Location: 753859
Implantable Lead Location: 753860
Implantable Pulse Generator Implant Date: 20191031
Lead Channel Impedance Value: 460 Ohm
Lead Channel Impedance Value: 560 Ohm
Lead Channel Pacing Threshold Amplitude: 0.5 V
Lead Channel Pacing Threshold Amplitude: 0.5 V
Lead Channel Pacing Threshold Pulse Width: 0.5 ms
Lead Channel Pacing Threshold Pulse Width: 0.5 ms
Lead Channel Sensing Intrinsic Amplitude: 12 mV
Lead Channel Sensing Intrinsic Amplitude: 5 mV
Lead Channel Setting Pacing Amplitude: 0.75 V
Lead Channel Setting Pacing Amplitude: 1.5 V
Lead Channel Setting Pacing Pulse Width: 0.5 ms
Lead Channel Setting Sensing Sensitivity: 2 mV
Pulse Gen Model: 2272
Pulse Gen Serial Number: 9079034

## 2019-11-15 NOTE — Progress Notes (Signed)
PPM Remote  

## 2019-12-03 ENCOUNTER — Ambulatory Visit: Payer: BC Managed Care – PPO | Attending: Internal Medicine

## 2019-12-03 DIAGNOSIS — Z23 Encounter for immunization: Secondary | ICD-10-CM | POA: Insufficient documentation

## 2019-12-03 NOTE — Progress Notes (Signed)
   Covid-19 Vaccination Clinic  Name:  [name]    MRN: [id number] DOB: [birth year]  12/03/2019  [name] was observed post Covid-19 immunization for 15 minutes without incidence. She was provided with Vaccine Information Sheet and instruction to access the V-Safe system.   [name] was instructed to call 911 with any severe reactions post vaccine: Marland Kitchen Difficulty breathing  . Swelling of your face and throat  . A fast heartbeat  . A bad rash all over your body  . Dizziness and weakness    Immunizations Administered    Name Date Dose VIS Date Route   Pfizer COVID-19 Vaccine 12/03/2019 12:10 PM 0.3 mL 09/16/2019 Intramuscular   Manufacturer: [place]   Lot: [id number]   [place]: [id number]

## 2019-12-14 ENCOUNTER — Emergency Department (HOSPITAL_COMMUNITY): Payer: No Typology Code available for payment source

## 2019-12-14 ENCOUNTER — Telehealth: Payer: Self-pay | Admitting: *Deleted

## 2019-12-14 ENCOUNTER — Other Ambulatory Visit: Payer: Self-pay

## 2019-12-14 ENCOUNTER — Encounter (HOSPITAL_COMMUNITY): Payer: Self-pay | Admitting: Emergency Medicine

## 2019-12-14 ENCOUNTER — Emergency Department (HOSPITAL_COMMUNITY)
Admission: EM | Admit: 2019-12-14 | Discharge: 2019-12-14 | Disposition: A | Discharge status: Home / Self Care | Payer: No Typology Code available for payment source | Attending: Emergency Medicine | Admitting: Emergency Medicine

## 2019-12-14 DIAGNOSIS — Y939 Activity, unspecified: Secondary | ICD-10-CM | POA: Insufficient documentation

## 2019-12-14 DIAGNOSIS — E039 Hypothyroidism, unspecified: Secondary | ICD-10-CM | POA: Insufficient documentation

## 2019-12-14 DIAGNOSIS — Z7901 Long term (current) use of anticoagulants: Secondary | ICD-10-CM | POA: Diagnosis not present

## 2019-12-14 DIAGNOSIS — S81012A Laceration without foreign body, left knee, initial encounter: Secondary | ICD-10-CM | POA: Insufficient documentation

## 2019-12-14 DIAGNOSIS — Y929 Unspecified place or not applicable: Secondary | ICD-10-CM | POA: Insufficient documentation

## 2019-12-14 DIAGNOSIS — Z950 Presence of cardiac pacemaker: Secondary | ICD-10-CM | POA: Diagnosis not present

## 2019-12-14 DIAGNOSIS — Z79899 Other long term (current) drug therapy: Secondary | ICD-10-CM | POA: Insufficient documentation

## 2019-12-14 DIAGNOSIS — W010XXA Fall on same level from slipping, tripping and stumbling without subsequent striking against object, initial encounter: Secondary | ICD-10-CM | POA: Diagnosis not present

## 2019-12-14 DIAGNOSIS — J449 Chronic obstructive pulmonary disease, unspecified: Secondary | ICD-10-CM | POA: Insufficient documentation

## 2019-12-14 DIAGNOSIS — Y999 Unspecified external cause status: Secondary | ICD-10-CM | POA: Diagnosis not present

## 2019-12-14 DIAGNOSIS — F17210 Nicotine dependence, cigarettes, uncomplicated: Secondary | ICD-10-CM | POA: Insufficient documentation

## 2019-12-14 DIAGNOSIS — W19XXXA Unspecified fall, initial encounter: Secondary | ICD-10-CM

## 2019-12-14 MED ORDER — LIDOCAINE-EPINEPHRINE 2 %-1:100000 IJ SOLN
20.0000 mL | Freq: Once | INTRAMUSCULAR | Status: AC
  Administered 2019-12-14: 20 mL via INTRADERMAL
  Filled 2019-12-14: qty 1

## 2019-12-14 MED ORDER — HYDROCODONE-ACETAMINOPHEN 5-325 MG PO TABS
1.0000 | ORAL_TABLET | Freq: Once | ORAL | Status: AC
  Administered 2019-12-14: 1 via ORAL
  Filled 2019-12-14: qty 1

## 2019-12-14 NOTE — ED Triage Notes (Signed)
Pt reports that she fell over a box this morning and caused injury to her left knee. Reports went to UC who told her it was too deep for them to take care of and to go to the ED. Knee is wrapped at this time.

## 2019-12-14 NOTE — Discharge Instructions (Addendum)
Return here as needed.  Watch for signs of infection.  Keep the areas clean and dry.  Have sutures out in 14 days

## 2019-12-14 NOTE — ED Provider Notes (Signed)
[place] DEPT Provider Note   CSN: [id number] Arrival date & time: 12/14/19  0941     History Chief Complaint  Patient presents with  . Fall  . Knee Pain    [name] is a 59 y.o. female.  HPI Patient presents to the emergency department with fall resulted in a laceration to her left knee region.  The laceration is just below the knee on the left.  The patient states that certain movements and patient make the pain worse.  Patient states that she did not hit her head or cause any other issues.  Patient denies hitting her head or dizziness.  The patient denies chest pain, shortness of breath, headache,blurred vision, neck pain, fever, cough, weakness, numbness, dizziness, anorexia, edema, abdominal pain, nausea, vomiting, diarrhea, rash, back pain, dysuria, hematemesis, bloody stool, near syncope, or syncope.    Past Medical History:  Diagnosis Date  . Alcohol abuse    a. quit several years ago.  . Allergy   . Anemia    younger  . Arthritis    feet,knees  . B12 deficiency   . BPPV (benign paroxysmal positional vertigo)   . Cirrhosis ([place])   . Complication of anesthesia    "slow to wake"  . Constipation    on movantik- stools regular and soft on this  . COPD (chronic obstructive pulmonary disease) ([place])   . Foot drop   . GERD (gastroesophageal reflux disease)   . Hyperlipidemia   . Hypothyroidism   . Neuromuscular disorder (H[facility])    idiopathic neuropathy  . 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 Spinal cord stimulator status   . Spinal stenosis   . Tobacco abuse     Patient Active Problem List   Diagnosis Date Noted  . Tachy-brady syndrome ([place]) 08/05/2018  . Closed fracture of left ankle 06/01/2018  . Paroxysmal atrial fibrillation ([place]) 06/01/2018  . Benign essential hypertension 06/01/2018  . Atrial flutter ([place]) 06/01/2018  . Near syncope 06/01/2018  .  Bradycardia   . Posterior tibial tendon dysfunction (PTTD) of right lower extremity 03/15/2018  . Primary localized osteoarthrosis of ankle and foot 01/21/2018  . Long-term current use of opiate analgesic 11/10/2017  . Therapeutic opioid induced constipation 10/27/2017  . On long term drug therapy 10/14/2017  . Chronic low back pain 10/14/2017  . 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 Chronic pain syndrome 10/14/2017  . Pain in left foot 10/12/2017  . Pain in right foot 10/12/2017  . B12 deficiency 04/21/2016  . Cobalamin deficiency 04/21/2016  . Thrombocytopenia ([place]) 11/28/2015  . Chronic pain 11/28/2015  . Disease of jaw 11/28/2015  . Hypokalemia 11/28/2015  . Impaired fasting glucose 11/28/2015  . Swelling of both lower extremities 11/28/2015  . Tobacco abuse 11/28/2015  . Vertigo 11/28/2015  . Hereditary and idiopathic neuropathy 11/28/2015  . Hereditary and idiopathic peripheral neuropathy 09/17/2009  . Disorder of peripheral nervous system 09/17/2009  . Alcoholic cirrhosis of liver ([place]) 10/02/2008  . JAUNDICE 10/02/2008  . Generalized abdominal pain 10/02/2008  . Ascites 10/02/2008  . Jaundice 10/02/2008  . 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 Esophageal varices ([place]) 04/04/2008  . Alcohol abuse 03/16/2008  . TRANSAMINASES, SERUM, ELEVATED 03/16/2008  . Elevated levels of transaminase & lactic acid dehydrogenase 03/16/2008  . SCARLET FEVER 03/14/2008  . Hyperlipidemia 03/14/2008  . External hemorrhoids 03/14/2008  . Umbilical hernia [id number]  .  Endometriosis 03/14/2008  . DYSMENORRHEA 03/14/2008  . INSOMNIA UNSPECIFIED 03/14/2008  . Large liver 03/14/2008  . Splenomegaly 03/14/2008  . LIVER FUNCTION TESTS, ABNORMAL, HX OF 03/14/2008  . ANAL FISSURE, HX OF 03/14/2008  . Insomnia 03/14/2008  . Allergic rhinitis 05/31/2007  . Gastroesophageal reflux disease 05/31/2007  . Osteoarthritis 05/31/2007    Past Surgical  History:  Procedure Laterality Date  . ANKLE SURGERY    . CERVICAL CONE BIOPSY    . CESAREAN SECTION    . COLONOSCOPY    . HERNIA REPAIR    . KNEE SURGERY     x5  . ORIF ANKLE FRACTURE Left 06/02/2018   Procedure: OPEN REDUCTION INTERNAL FIXATION (ORIF) ANKLE FRACTURE;  Surgeon: Wylene Simmer, MD;  Location: [facility];  Service: Orthopedics;  Laterality: Left;  . ORIF ANKLE FRACTURE Left 08/26/2018   Procedure: Left ankle syndesmosis OPEN REDUCTION INTERNAL FIXATION (ORIF)/ reconstruction of deltoid ligament;  Surgeon: Wylene Simmer, MD;  Location: [facility];  Service: Orthopedics;  Laterality: Left;  . PACEMAKER IMPLANT N/A 08/05/2018   Procedure: PACEMAKER IMPLANT;  Surgeon: Constance Haw, MD;  Location: [facility];  Service: Cardiovascular;  Laterality: N/A;  . POLYPECTOMY    . SPINAL CORD STIMULATOR INSERTION N/A 05/07/2016   Procedure: LUMBAR SPINAL CORD STIMULATOR INSERTION;  Surgeon: Melina Schools, MD;  Location: [facility];  Service: Orthopedics;  Laterality: N/A;     OB History   No obstetric history on file.     Family History  Adopted: Yes  Family history unknown: Yes    Social History   Tobacco Use  . Smoking status: Current Every Day Smoker    Packs/day: 1.00    Years: 30.00    Pack years: 30.00    Types: Cigarettes  . Smokeless tobacco: Never Used  Substance Use Topics  . Alcohol use: Not Currently    Comment: Prior alcohol abuse - quit several years ago.  . Drug use: No    Home Medications Prior to Admission medications   Medication Sig Start Date End Date Taking? Authorizing Provider  acetaminophen (TYLENOL) 325 MG tablet Take 650 mg by mouth every 6 (six) hours as needed for mild pain or headache.   Yes [provider]  apixaban (ELIQUIS) 5 MG TABS tablet Take 1 tablet (5 mg total) by mouth 2 (two) times daily. 06/24/19  Yes Josue Hector, MD  B Complex Vitamins (B COMPLEX PO) Take 1 tablet by mouth daily.   Yes [provider]  escitalopram (LEXAPRO) 10 MG tablet Take 10 mg by mouth daily. 09/19/19  Yes [provider]  famotidine (PEPCID) 20 MG tablet Take 20 mg by mouth 3 (three) times daily. 05/26/18  Yes [provider]  fluticasone (FLONASE) 50 MCG/ACT nasal spray Place 2 sprays into both nostrils daily as needed for allergies (seasonal allergies).  04/19/18  Yes [provider]  folic acid (FOLVITE) 1 MG tablet Take 1 mg by mouth daily.  04/01/16  Yes [provider]  hydrochlorothiazide (HYDRODIURIL) 25 MG tablet Take 25 mg by mouth daily. 04/14/18  Yes [provider]  levothyroxine (SYNTHROID) 25 MCG tablet Take 25 mcg by mouth daily before breakfast.  03/31/16  Yes [provider]  lisinopril (ZESTRIL) 2.5 MG tablet TAKE 1 TABLET BY MOUTH EVERY DAY Patient taking differently: Take 2.5 mg by mouth daily.  10/13/19  Yes Josue Hector, MD  MULTAQ 400 MG tablet TAKE 1 TABLET (400 MG TOTAL) BY  MOUTH 2 (TWO) TIMES DAILY WITH A MEAL. Patient taking differently: Take 400 mg by mouth 2 (two) times daily with a meal.  09/21/19  Yes Camnitz, Will Hassell Done, MD  oxyCODONE-acetaminophen (PERCOCET) 10-325 MG tablet Take 1 tablet by mouth every 4 (four) hours as needed for pain. 05/07/16  Yes Melina Schools, MD  pregabalin (LYRICA) 150 MG capsule Take 150 mg by mouth 3 (three) times daily.  11/15/15  Yes [provider]  rosuvastatin (CRESTOR) 10 MG tablet Take 10 mg by mouth every evening.    Yes [provider]  Vitamin D, Ergocalciferol, (DRISDOL) 50000 units CAPS capsule Take 50,000 Units by mouth every Wednesday.  03/16/16  Yes [provider]  albuterol (PROVENTIL HFA;VENTOLIN HFA) 108 (90 Base) MCG/ACT inhaler Inhale 2 puffs into the lungs every 6 (six) hours as needed for wheezing or shortness of breath.  05/14/18   [provider]  BREO ELLIPTA 200-25 MCG/INH AEPB Inhale 1 puff into the lungs daily. 05/17/18   [provider]  PAZEO 0.7 % SOLN Place 1 drop into both eyes daily as needed (allergies).  03/02/18   [provider]    Allergies    Short ragweed pollen ext, Sulfamethoxazole, and Vancomycin  Review of Systems   Review of Systems All other systems negative except as documented in the HPI. All pertinent positives and negatives as reviewed in the HPI. Physical Exam Updated Vital Signs BP 140/69 (BP Location: Right Arm)   Pulse 60   Temp 97.9 F (36.6 C) (Oral)   Resp 19   LMP  (LMP Unknown)   SpO2 100%   Physical Exam Vitals and nursing note reviewed.  Constitutional:      General: She is not in acute distress.    Appearance: She is well-developed.  HENT:     Head: Normocephalic and atraumatic.  Eyes:     Pupils: Pupils are equal, round, and reactive to light.  Pulmonary:     Effort: Pulmonary effort is normal.  Musculoskeletal:     Right knee: No bony tenderness. Normal range of motion. Tenderness present. No patellar tendon tenderness.       Legs:  Skin:    General: Skin is warm and dry.  Neurological:     Mental Status: She is alert and oriented to person, place, and time.     ED Results / Procedures / Treatments   Labs (all labs ordered are listed, but only abnormal results are displayed) Labs Reviewed - No data to display  EKG None  Radiology DG Knee Complete 4 Views Left  Result Date: 12/14/2019 CLINICAL DATA:  Fall today. Left knee pain and laceration. Initial encounter. EXAM: LEFT KNEE - COMPLETE 4+ VIEW COMPARISON:  None. 06/01/2018 FINDINGS: No evidence of fracture, dislocation, or joint effusion. Moderate tricompartmental osteoarthritis is again seen, without significant change since prior exam. IMPRESSION: 1. No acute findings. 2. Moderate tricompartmental osteoarthritis. Electronically Signed   By: Marlaine Hind M.D.   On: 12/14/2019 10:56    Procedures Procedures (including critical care time)  Medications Ordered in ED Medications    HYDROcodone-acetaminophen (NORCO/VICODIN) 5-325 MG per tablet 1 tablet (1 tablet Oral Given 12/14/19 1038)  lidocaine-EPINEPHrine (XYLOCAINE W/EPI) 2 %-1:100000 (with pres) injection 20 mL (20 mLs Intradermal Given 12/14/19 1247)    ED Course  I have reviewed the triage vital signs and the nursing notes.  Pertinent labs & imaging results that were available during my care of the patient were reviewed by me and  considered in my medical decision making (see chart for details).    MDM Rules/Calculators/A&P                      LACERATION REPAIR Performed by: Brent General Authorized by: Resa Miner Algernon Mundie Consent: Verbal consent obtained. Risks and benefits: risks, benefits and alternatives were discussed Consent given by: patient Patient identity confirmed: provided demographic data Prepped and Draped in normal sterile fashion Wound explored  Laceration Location: area just below knee on left  Laceration Length: 4cm  No Foreign Bodies seen or palpated  Anesthesia: local infiltration  Local anesthetic: lidocaine 2% wo epinephrine  Anesthetic total: 6 ml  Irrigation method: syringe Amount of cleaning: standard  Skin closure: 4-0 Vicryl and 4-0 Prolene  Number of sutures: 4 subcutaneous ad 12 simple interrupted the center portion of the wound has a skin tear and this was secured with Steri-Strips and Dermabond.  Technique: see above  Patient tolerance: Patient tolerated the procedure well with no immediate complications.   Patient is advised to return here for any signs of infection.  Have advised her to follow-up with her primary doctor.  Have sutures out in 14 days.  Told to keep the areas clean and dry.  Final Clinical Impression(s) / ED Diagnoses Final diagnoses:  None    Rx / DC Orders ED Discharge Orders    None       Dalia Heading, PA-C 12/14/19 1347    Little, Wenda Overland, MD 12/18/19 [phone]

## 2019-12-14 NOTE — Telephone Encounter (Signed)
-----   Message from Will Meredith Leeds, MD sent at 11/14/2019 12:08 PM EST ----- Abnormal device interrogation reviewed.  Lead parameters and battery status stable.  Rapid AF noted. Start metoprolol 25 mg BID

## 2019-12-14 NOTE — Telephone Encounter (Signed)
Stanton Kidney, RN  12/14/2019 8:16 AM EST    Attempted to reach pt again. Pt not home, husband reports that she was at work this morning and fell. Expressed that I hope everything is ok and to have her call me at her convenience.   Stephani Police, RN  11/16/2019 10:39 AM EST    LMTCB  Stephani Police, RN 11/16/2019 10:39 AM

## 2019-12-18 ENCOUNTER — Emergency Department (HOSPITAL_COMMUNITY): Payer: BC Managed Care – PPO

## 2019-12-18 ENCOUNTER — Encounter (HOSPITAL_COMMUNITY): Payer: Self-pay

## 2019-12-18 ENCOUNTER — Emergency Department (HOSPITAL_COMMUNITY)
Admission: EM | Admit: 2019-12-18 | Discharge: 2019-12-18 | Disposition: A | Discharge status: Home / Self Care | Payer: BC Managed Care – PPO | Attending: Emergency Medicine | Admitting: Emergency Medicine

## 2019-12-18 ENCOUNTER — Other Ambulatory Visit: Payer: Self-pay

## 2019-12-18 DIAGNOSIS — Y9389 Activity, other specified: Secondary | ICD-10-CM | POA: Diagnosis not present

## 2019-12-18 DIAGNOSIS — Z7901 Long term (current) use of anticoagulants: Secondary | ICD-10-CM | POA: Diagnosis not present

## 2019-12-18 DIAGNOSIS — Y92009 Unspecified place in unspecified non-institutional (private) residence as the place of occurrence of the external cause: Secondary | ICD-10-CM | POA: Diagnosis not present

## 2019-12-18 DIAGNOSIS — W0110XA Fall on same level from slipping, tripping and stumbling with subsequent striking against unspecified object, initial encounter: Secondary | ICD-10-CM | POA: Diagnosis not present

## 2019-12-18 DIAGNOSIS — W19XXXA Unspecified fall, initial encounter: Secondary | ICD-10-CM

## 2019-12-18 DIAGNOSIS — S0083XA Contusion of other part of head, initial encounter: Secondary | ICD-10-CM | POA: Insufficient documentation

## 2019-12-18 DIAGNOSIS — Y999 Unspecified external cause status: Secondary | ICD-10-CM | POA: Diagnosis not present

## 2019-12-18 DIAGNOSIS — T148XXA Other injury of unspecified body region, initial encounter: Secondary | ICD-10-CM

## 2019-12-18 DIAGNOSIS — S0990XA Unspecified injury of head, initial encounter: Secondary | ICD-10-CM | POA: Diagnosis present

## 2019-12-18 DIAGNOSIS — Z79899 Other long term (current) drug therapy: Secondary | ICD-10-CM | POA: Diagnosis not present

## 2019-12-18 DIAGNOSIS — F17210 Nicotine dependence, cigarettes, uncomplicated: Secondary | ICD-10-CM | POA: Insufficient documentation

## 2019-12-18 NOTE — ED Triage Notes (Signed)
Patient arrived stating she has recently had a change in medication and some make her dizzy. Reports falling roughly 15 minutes ago and hitting her head. Some swelling and bruising noted on left forehead. Patient is on blood thinners. Denies any LOC.

## 2019-12-18 NOTE — Discharge Instructions (Signed)
You can apply ice to help with the pain and swelling of the hematoma on her forehead.  As we discussed gust, your CT of your neck showed some degenerative changes that could be causing pain and pressure on her spinal cord.  This is not likely caused by the fall today.  You can follow-up with referred neurosurgery office for further evaluation or symptoms.  Return to the emergency department for any confusion, vomiting, difficulty walking, numbness/weakness of arms or legs.

## 2019-12-18 NOTE — ED Provider Notes (Signed)
[name] DEPT Provider Note   CSN: [id number] Arrival date & time: 12/18/19  2001     History Chief Complaint  Patient presents with  . Fall    [name] is a 59 y.o. female who presents for evaluation after mechanical fall that occurred just prior to ED arrival.  Patient reports that she was at home and was sorting her pills.  She states that several pills fell to the ground and she was trying to get them before her dog could get them.  She reports that she was bending down and went to stand up, she lost balance, causing her to fall forward and hitting her head.  No LOC.  She is on Eliquis.  She also endorses drinking 3 glasses of wine tonight prior to the incident.  Patient states she has some slight headache and states it is a 3/10.  She also notes some swelling to her forehead.  She did not have any preceding chest pain or dizziness that caused her to fall.  Denies any vision changes, numbness/weakness of her arms or legs, difficulty speaking, nausea/vomiting.   The history is provided by the patient.       Past Medical History:  Diagnosis Date  . Alcohol abuse    a. quit several years ago.  . Allergy   . Anemia    younger  . Arthritis    feet,knees  . B12 deficiency   . BPPV (benign paroxysmal positional vertigo)   . Cirrhosis ([place])   . Complication of anesthesia    "slow to wake"  . Constipation    on movantik- stools regular and soft on this  . COPD (chronic obstructive pulmonary disease) ([place])   . Foot drop   . GERD (gastroesophageal reflux disease)   . Hyperlipidemia   . Hypothyroidism   . Neuromuscular disorder (HCC)    idiopathic neuropathy  . 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 Spinal cord stimulator status   . Spinal stenosis   . Tobacco abuse     Patient Active Problem List   Diagnosis Date Noted  . Tachy-brady syndrome ([place]) 08/05/2018  . Closed  fracture of left ankle 06/01/2018  . Paroxysmal atrial fibrillation ([place]) 06/01/2018  . Benign essential hypertension 06/01/2018  . Atrial flutter ([place]) 06/01/2018  . Near syncope 06/01/2018  . Bradycardia   . Posterior tibial tendon dysfunction (PTTD) of right lower extremity 03/15/2018  . Primary localized osteoarthrosis of ankle and foot 01/21/2018  . Long-term current use of opiate analgesic 11/10/2017  . Therapeutic opioid induced constipation 10/27/2017  . On long term drug therapy 10/14/2017  . Chronic low back pain 10/14/2017  . 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 Chronic pain syndrome 10/14/2017  . Pain in left foot 10/12/2017  . Pain in right foot 10/12/2017  . B12 deficiency 04/21/2016  . Cobalamin deficiency 04/21/2016  . Thrombocytopenia ([place]) 11/28/2015  . Chronic pain 11/28/2015  . Disease of jaw 11/28/2015  . Hypokalemia 11/28/2015  . Impaired fasting glucose 11/28/2015  . Swelling of both lower extremities 11/28/2015  . Tobacco abuse 11/28/2015  . Vertigo 11/28/2015  . Hereditary and idiopathic neuropathy 11/28/2015  . Hereditary and idiopathic peripheral neuropathy 09/17/2009  . Disorder of peripheral nervous system 09/17/2009  . Alcoholic cirrhosis of liver ([place]) 10/02/2008  . JAUNDICE 10/02/2008  . Generalized abdominal pain 10/02/2008  . Ascites 10/02/2008  . Jaundice 10/02/2008  .  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 Esophageal varices ([place]) 04/04/2008  . Alcohol abuse 03/16/2008  . TRANSAMINASES, SERUM, ELEVATED 03/16/2008  . Elevated levels of transaminase & lactic acid dehydrogenase 03/16/2008  . SCARLET FEVER 03/14/2008  . Hyperlipidemia 03/14/2008  . External hemorrhoids 03/14/2008  . Umbilical hernia [id number]  . Endometriosis 03/14/2008  . DYSMENORRHEA 03/14/2008  . INSOMNIA UNSPECIFIED 03/14/2008  . Large liver 03/14/2008  . Splenomegaly 03/14/2008  . LIVER FUNCTION TESTS, ABNORMAL, HX OF  03/14/2008  . ANAL FISSURE, HX OF 03/14/2008  . Insomnia 03/14/2008  . Allergic rhinitis 05/31/2007  . Gastroesophageal reflux disease 05/31/2007  . Osteoarthritis 05/31/2007    Past Surgical History:  Procedure Laterality Date  . ANKLE SURGERY    . CERVICAL CONE BIOPSY    . CESAREAN SECTION    . COLONOSCOPY    . HERNIA REPAIR    . KNEE SURGERY     x5  . ORIF ANKLE FRACTURE Left 06/02/2018   Procedure: OPEN REDUCTION INTERNAL FIXATION (ORIF) ANKLE FRACTURE;  Surgeon: Wylene Simmer, MD;  Location: [facility];  Service: Orthopedics;  Laterality: Left;  . ORIF ANKLE FRACTURE Left 08/26/2018   Procedure: Left ankle syndesmosis OPEN REDUCTION INTERNAL FIXATION (ORIF)/ reconstruction of deltoid ligament;  Surgeon: Wylene Simmer, MD;  Location: [facility];  Service: Orthopedics;  Laterality: Left;  . PACEMAKER IMPLANT N/A 08/05/2018   Procedure: PACEMAKER IMPLANT;  Surgeon: Constance Haw, MD;  Location: [facility];  Service: Cardiovascular;  Laterality: N/A;  . POLYPECTOMY    . SPINAL CORD STIMULATOR INSERTION N/A 05/07/2016   Procedure: LUMBAR SPINAL CORD STIMULATOR INSERTION;  Surgeon: Melina Schools, MD;  Location: [facility];  Service: Orthopedics;  Laterality: N/A;     OB History   No obstetric history on file.     Family History  Adopted: Yes  Family history unknown: Yes    Social History   Tobacco Use  . Smoking status: Current Every Day Smoker    Packs/day: 1.00    Years: 30.00    Pack years: 30.00    Types: Cigarettes  . Smokeless tobacco: Never Used  Substance Use Topics  . Alcohol use: Not Currently    Comment: Prior alcohol abuse - quit several years ago.  . Drug use: No    Home Medications Prior to Admission medications   Medication Sig Start Date End Date Taking? Authorizing Provider  acetaminophen (TYLENOL) 325 MG tablet Take 650 mg by mouth every 6 (six) hours as needed for mild pain or headache.    [provider]  albuterol  (PROVENTIL HFA;VENTOLIN HFA) 108 (90 Base) MCG/ACT inhaler Inhale 2 puffs into the lungs every 6 (six) hours as needed for wheezing or shortness of breath.  05/14/18   [provider]  apixaban (ELIQUIS) 5 MG TABS tablet Take 1 tablet (5 mg total) by mouth 2 (two) times daily. 06/24/19   Josue Hector, MD  B Complex Vitamins (B COMPLEX PO) Take 1 tablet by mouth daily.    [provider]  BREO ELLIPTA 200-25 MCG/INH AEPB Inhale 1 puff into the lungs daily. 05/17/18   [provider]  escitalopram (LEXAPRO) 10 MG tablet Take 10 mg by mouth daily. 09/19/19   [provider]  famotidine (PEPCID) 20 MG tablet Take 20 mg by mouth 3 (three) times daily. 05/26/18   [provider]  fluticasone (FLONASE) 50 MCG/ACT nasal spray Place 2 sprays into both nostrils daily as needed for allergies (  seasonal allergies).  04/19/18   [provider]  folic acid (FOLVITE) 1 MG tablet Take 1 mg by mouth daily.  04/01/16   [provider]  hydrochlorothiazide (HYDRODIURIL) 25 MG tablet Take 25 mg by mouth daily. 04/14/18   [provider]  levothyroxine (SYNTHROID) 25 MCG tablet Take 25 mcg by mouth daily before breakfast.  03/31/16   [provider]  lisinopril (ZESTRIL) 2.5 MG tablet TAKE 1 TABLET BY MOUTH EVERY DAY Patient taking differently: Take 2.5 mg by mouth daily.  10/13/19   Josue Hector, MD  MULTAQ 400 MG tablet TAKE 1 TABLET (400 MG TOTAL) BY MOUTH 2 (TWO) TIMES DAILY WITH A MEAL. Patient taking differently: Take 400 mg by mouth 2 (two) times daily with a meal.  09/21/19   Camnitz, Ocie Doyne, MD  oxyCODONE-acetaminophen (PERCOCET) 10-325 MG tablet Take 1 tablet by mouth every 4 (four) hours as needed for pain. 05/07/16   Melina Schools, MD  PAZEO 0.7 % SOLN Place 1 drop into both eyes daily as needed (allergies).  03/02/18   [provider]  pregabalin (LYRICA) 150 MG capsule Take 150 mg by mouth 3 (three) times daily.  11/15/15    [provider]  rosuvastatin (CRESTOR) 10 MG tablet Take 10 mg by mouth every evening.     [provider]  Vitamin D, Ergocalciferol, (DRISDOL) 50000 units CAPS capsule Take 50,000 Units by mouth every Wednesday.  03/16/16   [provider]    Allergies    Short ragweed pollen ext, Sulfamethoxazole, and Vancomycin  Review of Systems   Review of Systems  Respiratory: Negative for shortness of breath.   Cardiovascular: Negative for chest pain.  Gastrointestinal: Negative for abdominal pain, nausea and vomiting.  Neurological: Positive for headaches. Negative for weakness and numbness.  All other systems reviewed and are negative.   Physical Exam Updated Vital Signs BP 118/71   Pulse 79   Temp 98.2 F (36.8 C) (Oral)   Resp 20   Ht 5\' 9"  (1.753 m)   Wt 109.8 kg   LMP  (LMP Unknown)   SpO2 100%   BMI 35.74 kg/m   Physical Exam Vitals and nursing note reviewed.  Constitutional:      Appearance: Normal appearance. She is well-developed.  HENT:     Head: Normocephalic.      Comments: Hematoma noted to left frontal forehead.  No underlying skull deformity or crepitus noted.  No other injury. Eyes:     General: Lids are normal.     Conjunctiva/sclera: Conjunctivae normal.     Pupils: Pupils are equal, round, and reactive to light.  Neck:     Comments: FROM without difficulty. Mild tenderness noted to the midline C spine at about C4-C6.  Diffuse muscular tenderness noted to the paraspinal muscles of the left cervical region. Cardiovascular:     Rate and Rhythm: Normal rate and regular rhythm.     Pulses: Normal pulses.     Heart sounds: Normal heart sounds. No murmur. No friction rub. No gallop.   Pulmonary:     Effort: Pulmonary effort is normal.     Breath sounds: Normal breath sounds.  Abdominal:     Palpations: Abdomen is soft. Abdomen is not rigid.     Tenderness: There is no abdominal tenderness. There is no guarding.  Musculoskeletal:         General: Normal range of motion.  Skin:    General: Skin is warm and dry.  Capillary Refill: Capillary refill takes less than 2 seconds.  Neurological:     Mental Status: She is alert and oriented to person, place, and time.     Comments: Cranial nerves III-XII intact Follows commands, Moves all extremities  5/5 strength to BUE and BLE  Sensation intact throughout all major nerve distributions No gait abnormalities  No slurred speech. No facial droop.   Psychiatric:        Speech: Speech normal.     ED Results / Procedures / Treatments   Labs (all labs ordered are listed, but only abnormal results are displayed) Labs Reviewed - No data to display  EKG None  Radiology CT Head Wo Contrast  Result Date: 12/18/2019 CLINICAL DATA:  Status post fall. EXAM: CT HEAD WITHOUT CONTRAST TECHNIQUE: Contiguous axial images were obtained from the base of the skull through the vertex without intravenous contrast. COMPARISON:  June 01, 2018 FINDINGS: Brain: No evidence of acute infarction, hemorrhage, hydrocephalus, extra-axial collection or mass lesion/mass effect. Vascular: No hyperdense vessel or unexpected calcification. Skull: Normal. Negative for fracture or focal lesion. Sinuses/Orbits: No acute finding. Other: None. IMPRESSION: No acute intracranial pathology. Electronically Signed   By: Virgina Norfolk M.D.   On: 12/18/2019 20:51   CT Cervical Spine Wo Contrast  Result Date: 12/18/2019 CLINICAL DATA:  Dizziness leading to fall striking head. EXAM: CT CERVICAL SPINE WITHOUT CONTRAST TECHNIQUE: Multidetector CT imaging of the cervical spine was performed without intravenous contrast. Multiplanar CT image reconstructions were also generated. COMPARISON:  None. FINDINGS: Alignment: Reversal of normal cervical lordosis. No traumatic subluxation. Skull base and vertebrae: No acute fracture. Vertebral body heights are maintained. The dens and skull base are intact. Soft tissues and  spinal canal: No prevertebral fluid or swelling. No visible canal hematoma. Disc levels: Disc space narrowing with endplate spurring most prominent at C5-C6. Posterior disc osteophyte complex at this level causes mass effect on the spinal canal and bilateral neural foraminal narrowing, right greater than left. Additional degenerative disc disease at C4-C5 and C6-C7. Upper chest: Pacemaker partially included. No acute findings. Other: Few prominent cervical lymph nodes, left greater than right, likely reactive. IMPRESSION: 1. Reversal of normal cervical lordosis may be due to positioning or muscle spasm. No acute fracture of the cervical spine. 2. Multilevel degenerative disc disease. Degenerative disc disease at C5-C6 with posterior disc osteophyte complex causing some degree of mass effect on the spinal canal. Electronically Signed   By: Keith Rake M.D.   On: 12/18/2019 21:58    Procedures Procedures (including critical care time)  Medications Ordered in ED Medications - No data to display  ED Course  I have reviewed the triage vital signs and the nursing notes.  Pertinent labs & imaging results that were available during my care of the patient were reviewed by me and considered in my medical decision making (see chart for details).    MDM Rules/Calculators/A&P                      59 year old female who presents for evaluation of mechanical fall that occurred just prior to arrival.  She reports that she had bent down to pick up some pills and had lost balance and fell forward.  No LOC.  She is on blood thinners.  Patient reports a slight headache and hematoma noted to the left frontal head.  Patient also reports that she has had some neck pain since the fall that occurred on 12/15/2019.  She not have  any imaging of the neck at this time. Patient is afebrile, non-toxic appearing, sitting comfortably on examination table. Vital signs reviewed and stable.  No neuro deficits noted on exam.  She  does have a hematoma noted to the forehead.  Given that she is on blood thinners, will plan for CT head.  Patient has been complaining of some neck pain since she fell.  Not any imaging during her previous visit.  I discussed with patient regarding imaging and patient would like to have it evaluated.  CT head shows no acute intracranial abnormality.  CT cervical spine shows degenerative disease is noted at C5-C6 with an osteophyte complex causing some degree of mass-effect.  I discussed results with patient.  At this time, she has no acute weakness/numbness of her arms or legs.  Discussed with her regarding the findings and that she will get referred to outpatient neurosurgery for further evaluation. At this time, patient exhibits no emergent life-threatening condition that require further evaluation in ED or admission. Patient had ample opportunity for questions and discussion. All patient's questions were answered with full understanding. Strict return precautions discussed. Patient expresses understanding and agreement to plan.   Portions of this note were generated with Lobbyist. Dictation errors may occur despite best attempts at proofreading.  Final Clinical Impression(s) / ED Diagnoses Final diagnoses:  Hematoma  Fall, initial encounter    Rx / DC Orders ED Discharge Orders    None       Desma Mcgregor 12/18/19 2305    Quintella Reichert, MD 12/18/19 2347

## 2019-12-18 NOTE — ED Notes (Signed)
Patient returned from CT

## 2019-12-18 NOTE — ED Notes (Signed)
Patient ambulated to restroom unassisted.  Gait steady.

## 2019-12-24 ENCOUNTER — Ambulatory Visit: Payer: BC Managed Care – PPO | Attending: Internal Medicine

## 2019-12-24 DIAGNOSIS — Z23 Encounter for immunization: Secondary | ICD-10-CM

## 2019-12-24 NOTE — Progress Notes (Signed)
   Covid-19 Vaccination Clinic  Name:  DEVERY HARPER    MRN: [id number] DOB: 1961/06/08  12/24/2019  Ms. Biswell was observed post Covid-19 immunization for 15 minutes without incident. She was provided with Vaccine Information Sheet and instruction to access the V-Safe system.   Ms. Roye was instructed to call 911 with any severe reactions post vaccine: Marland Kitchen Difficulty breathing  . Swelling of face and throat  . A fast heartbeat  . A bad rash all over body  . Dizziness and weakness   Immunizations Administered    Name Date Dose VIS Date Route   Pfizer COVID-19 Vaccine 12/24/2019 10:35 AM 0.3 mL 09/16/2019 Intramuscular   Manufacturer: [place]   Lot: [id number]   [place]: [id number]

## 2019-12-27 ENCOUNTER — Other Ambulatory Visit: Payer: Self-pay | Admitting: Cardiovascular Disease

## 2019-12-27 NOTE — Telephone Encounter (Signed)
Eliquis 5mg  refill request received, pt is 59 years old, weight-109.8kg, Crea-0.91 on 07/04/2019 via [facility] at [place], [place], and last seen by Dr. Curt Bears on 08/12/2019. Dose is appropriate based on dosing criteria. Will send in refill to requested pharmacy.

## 2019-12-28 ENCOUNTER — Telehealth: Payer: Self-pay | Admitting: Emergency Medicine

## 2019-12-28 NOTE — Telephone Encounter (Signed)
LMOM 91 day transmission received and patient noted to have episodes of AF/AFl with RVR. Calling to see if patient has been symptomatic. Episode on 12/27/19 @ 0502 that lasted 6 minutes and 7 seconds with average v-rate of 169 bpm.

## 2019-12-30 NOTE — Telephone Encounter (Signed)
Spoke with pt.  She reports that she was awake at time of episode.  She wakes up at 4am daily.  She does not recall being symptomatic on 3/23.  She cofirmed that she is taking Eliquis and Multaq 400mg  BID as ordered.

## 2020-01-02 ENCOUNTER — Ambulatory Visit: Payer: BC Managed Care – PPO

## 2020-01-02 MED ORDER — METOPROLOL TARTRATE 25 MG PO TABS: 25.0000 mg | ORAL_TABLET | Freq: Two times a day (BID) | ORAL | 3 refills | Status: DC

## 2020-01-02 NOTE — Telephone Encounter (Signed)
Start metoprolol 25 mg BID.

## 2020-01-02 NOTE — Telephone Encounter (Signed)
Patient contacted and given instructions to start metoprolol 25 mg twice a day per Dr Curt Bears instructions. Verified pharmacy to send order to. Education done on medication. Patient informed me that she has a BP cuff and that her SBP is usually 112-118. Instructed patient to change positions slowly and contact office if she has any dizziness, lightheadedness or syncope with position change.

## 2020-01-25 ENCOUNTER — Telehealth: Payer: Self-pay

## 2020-01-25 NOTE — Telephone Encounter (Signed)
[facility] alert received- Pt with known hsitory of AF on Eliquis for [place].  Pt appears to have had AF episode with HVR at times starting around midnight last night.  Current meds include Multaq 400mg  BID and Metoprolol 25mg  BID (started in March due to similar findings)  Presenting rhythm appears AF.    Attempted to contact to pt to determine if symptomatic and verify meds.  No answer, left VM requesting callback to device clinic #.  If asymptomatic consider new transmission to determine if pt out of AF. Presenting        Onset

## 2020-01-26 NOTE — Telephone Encounter (Signed)
Called patient to assess, [name] patients husband answered the phone, patient is at [employer]). Informed [name] to please have [name] call DC (phone number provided). States she will be home around 4:00 PM today and he will have her call.

## 2020-01-26 NOTE — Telephone Encounter (Signed)
Patient returned call. States she forgot to PM medications on 01/24/20. Patient reports of diaphoretic and "feeling bad" the following morning. States improved and was able to go to work a few hours later. Patient denies any complaints at this time. Patient advised if this happens again to send a manual transmission and call DC office. DC number provided. Informed patient is office was not open or she felt like she needed to go to the emergency department she should. Patient verbalizes understanding. Educated patient on how to use [facility] remote box.

## 2020-02-03 NOTE — Telephone Encounter (Signed)
Pt advised Dr. Curt Bears recommends [facility] f/u. She requests waiting until school is over in 1 month.  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 Reports doing well, just that one incident.  Reports it was simple accident as she fell asleep and forgot to take PM medications. (so missed PM dose). Incident occurred after. Pt more that agreeable to f/u once school is out.  She will call [facility] if experiences further issues and arrange sooner f/u.  [facility] # given to patient. Aware I will forward to [facility] to arrange OV.  Aware they will give her directions.  States best time to reach her is after 3pm.

## 2020-02-06 NOTE — Telephone Encounter (Signed)
Called patient to schedule appt.  Spouse stated pt is at another appt and will not be home until after we close.  I told spouse I would try to call her back tomorrow afternoon.

## 2020-02-08 NOTE — Telephone Encounter (Signed)
Called and reached pt.  She stated she was about to go into a meeting and would call back to schedule the appt.  [facility] phone number was provided to pt.

## 2020-02-13 ENCOUNTER — Ambulatory Visit (INDEPENDENT_AMBULATORY_CARE_PROVIDER_SITE_OTHER): Payer: BC Managed Care – PPO | Admitting: *Deleted

## 2020-02-13 DIAGNOSIS — I495 Sick sinus syndrome: Secondary | ICD-10-CM

## 2020-02-13 LAB — CUP PACEART REMOTE DEVICE CHECK
Battery Remaining Longevity: 131 mo
Battery Remaining Percentage: 95.5 %
Battery Voltage: 3.01 V
Brady Statistic AP VP Percent: 1 %
Brady Statistic AP VS Percent: 71 %
Brady Statistic AS VP Percent: 1 %
Brady Statistic AS VS Percent: 28 %
Brady Statistic RA Percent Paced: 66 %
Brady Statistic RV Percent Paced: 1.5 %
Date Time Interrogation Session: 20210510020013
Implantable Lead Implant Date: 20191031
Implantable Lead Implant Date: 20191031
Implantable Lead Location: 753859
Implantable Lead Location: 753860
Implantable Pulse Generator Implant Date: 20191031
Lead Channel Impedance Value: 540 Ohm
Lead Channel Impedance Value: 630 Ohm
Lead Channel Pacing Threshold Amplitude: 0.5 V
Lead Channel Pacing Threshold Amplitude: 0.5 V
Lead Channel Pacing Threshold Pulse Width: 0.5 ms
Lead Channel Pacing Threshold Pulse Width: 0.5 ms
Lead Channel Sensing Intrinsic Amplitude: 12 mV
Lead Channel Sensing Intrinsic Amplitude: 4.1 mV
Lead Channel Setting Pacing Amplitude: 0.75 V
Lead Channel Setting Pacing Amplitude: 1.5 V
Lead Channel Setting Pacing Pulse Width: 0.5 ms
Lead Channel Setting Sensing Sensitivity: 2 mV
Pulse Gen Model: 2272
Pulse Gen Serial Number: 9079034

## 2020-02-13 NOTE — Progress Notes (Signed)
Remote pacemaker transmission.   

## 2020-02-15 NOTE — Telephone Encounter (Signed)
Called and spoke with pt.  She is aware of appt 03/14/20.

## 2020-03-01 ENCOUNTER — Telehealth: Payer: Self-pay | Admitting: *Deleted

## 2020-03-01 MED ORDER — METOPROLOL TARTRATE 50 MG PO TABS: 50.0000 mg | ORAL_TABLET | Freq: Two times a day (BID) | ORAL | 2 refills | Status: DC

## 2020-03-01 NOTE — Telephone Encounter (Signed)
-----   Message from Will Meredith Leeds, MD sent at 02/27/2020 11:11 AM EDT ----- Abnormal device interrogation reviewed.  Lead parameters and battery status stable.  AF with RVR noted on multiple episodes.  Increase metoprolol to 50 mg twice daily.

## 2020-03-01 NOTE — Telephone Encounter (Signed)
Pt agreeable to increase. Advised to call office if issues begin after medication increase. She will let me know when she need new Rx for 50 mg tablets sent to pharmacy, she will double up and use what she has on hand at home first. Patient verbalized understanding and agreeable to plan.

## 2020-03-14 ENCOUNTER — Ambulatory Visit (HOSPITAL_COMMUNITY)
Admission: RE | Admit: 2020-03-14 | Discharge: 2020-03-14 | Disposition: A | Discharge status: Home / Self Care | Payer: BC Managed Care – PPO | Source: Ambulatory Visit | Attending: Nurse Practitioner | Admitting: Nurse Practitioner

## 2020-03-14 ENCOUNTER — Encounter (HOSPITAL_COMMUNITY): Payer: Self-pay | Admitting: Nurse Practitioner

## 2020-03-14 ENCOUNTER — Other Ambulatory Visit: Payer: Self-pay

## 2020-03-14 VITALS — BP 126/68 | HR 63 | Ht 69.0 in | Wt 242.8 lb

## 2020-03-14 DIAGNOSIS — D649 Anemia, unspecified: Secondary | ICD-10-CM | POA: Insufficient documentation

## 2020-03-14 DIAGNOSIS — D6869 Other thrombophilia: Secondary | ICD-10-CM

## 2020-03-14 DIAGNOSIS — F17210 Nicotine dependence, cigarettes, uncomplicated: Secondary | ICD-10-CM | POA: Insufficient documentation

## 2020-03-14 DIAGNOSIS — Z79899 Other long term (current) drug therapy: Secondary | ICD-10-CM | POA: Diagnosis not present

## 2020-03-14 DIAGNOSIS — J449 Chronic obstructive pulmonary disease, unspecified: Secondary | ICD-10-CM | POA: Diagnosis not present

## 2020-03-14 DIAGNOSIS — E039 Hypothyroidism, unspecified: Secondary | ICD-10-CM | POA: Insufficient documentation

## 2020-03-14 DIAGNOSIS — E785 Hyperlipidemia, unspecified: Secondary | ICD-10-CM | POA: Insufficient documentation

## 2020-03-14 DIAGNOSIS — Z7901 Long term (current) use of anticoagulants: Secondary | ICD-10-CM | POA: Insufficient documentation

## 2020-03-14 DIAGNOSIS — K219 Gastro-esophageal reflux disease without esophagitis: Secondary | ICD-10-CM | POA: Diagnosis not present

## 2020-03-14 DIAGNOSIS — I480 Paroxysmal atrial fibrillation: Secondary | ICD-10-CM | POA: Insufficient documentation

## 2020-03-14 NOTE — Progress Notes (Signed)
Primary Care Physician: Aletha Halim., PA-C Referring Physician: Dr. Susy Manor is a 59 y.o. female with a h/o afib, s/p PPM, that has been noted to have several episodes of afib per the device clinic. For the majority of these episodes, she  is not symptomatic. She is on Multaq 400 mg bid and her metoprolol was recently increased to 50 mg bid. Since then she has not received any further calls notifying her of afib. For now she is happy with her current management and does not want to change the course.   Today, she denies symptoms of palpitations, chest pain, shortness of breath, orthopnea, PND, lower extremity edema, dizziness, presyncope, syncope, or neurologic sequela. The patient is tolerating medications without difficulties and is otherwise without complaint today.   Past Medical History:  Diagnosis Date   Alcohol abuse    a. quit several years ago.   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   pacemaker insertion for tachy/brady syndrome   Spinal cord stimulator status    Spinal stenosis    Tobacco abuse    Past Surgical History:  Procedure Laterality Date   ANKLE SURGERY     CERVICAL CONE BIOPSY     CESAREAN SECTION     COLONOSCOPY     HERNIA REPAIR     KNEE SURGERY     x5   ORIF ANKLE FRACTURE Left 06/02/2018   Procedure: OPEN REDUCTION INTERNAL FIXATION (ORIF) ANKLE FRACTURE;  Surgeon: Wylene Simmer, MD;  Location: [facility];  Service: Orthopedics;  Laterality:  Left;   ORIF ANKLE FRACTURE Left 08/26/2018   Procedure: Left ankle syndesmosis OPEN REDUCTION INTERNAL FIXATION (ORIF)/ reconstruction of deltoid ligament;  Surgeon: Wylene Simmer, MD;  Location: [place];  Service: Orthopedics;  Laterality: Left;   PACEMAKER IMPLANT N/A 08/05/2018   Procedure: PACEMAKER IMPLANT;  Surgeon: Constance Haw, MD;  Location: [facility];  Service: Cardiovascular;  Laterality: N/A;   POLYPECTOMY     SPINAL CORD STIMULATOR INSERTION N/A 05/07/2016   Procedure: LUMBAR SPINAL CORD STIMULATOR INSERTION;  Surgeon: Melina Schools, MD;  Location: [place];  Service: Orthopedics;  Laterality: N/A;    Current Outpatient Medications  Medication Sig Dispense Refill   albuterol (PROVENTIL HFA;VENTOLIN HFA) 108 (90 Base) MCG/ACT inhaler Inhale 2 puffs into the lungs every 6 (six) hours as needed for wheezing or shortness of breath.      B Complex Vitamins (B COMPLEX PO) Take 1 tablet by mouth daily.     BREO ELLIPTA 200-25 MCG/INH AEPB Inhale 1 puff into the lungs daily.  11   ELIQUIS 5 MG TABS tablet TAKE 1 TABLET BY MOUTH TWICE A DAY 180 tablet 1   famotidine (PEPCID) 20 MG tablet Take 20 mg by mouth in the morning, at noon, in the evening, and at bedtime.   0   fluticasone (FLONASE) 50 MCG/ACT nasal  spray Place 2 sprays into both nostrils daily as needed for allergies (seasonal allergies).   11   folic acid (FOLVITE) 1 MG tablet Take 1 mg by mouth daily.      hydrochlorothiazide (HYDRODIURIL) 25 MG tablet Take 25 mg by mouth daily.  1   HYDROmorphone (DILAUDID) 4 MG tablet Take 4 mg by mouth 5 (five) times daily as needed.     levothyroxine (SYNTHROID) 25 MCG tablet Take 25 mcg by mouth daily before breakfast.      lisinopril (ZESTRIL) 10 MG tablet Take 20 mg by mouth 2 (two) times daily.     metaxalone (SKELAXIN) 800 MG tablet Take 800 mg by mouth 4 (four) times daily as needed.     metoprolol tartrate (LOPRESSOR) 50 MG tablet Take 1  tablet (50 mg total) by mouth 2 (two) times daily. 180 tablet 2   MULTAQ 400 MG tablet TAKE 1 TABLET (400 MG TOTAL) BY MOUTH 2 (TWO) TIMES DAILY WITH A MEAL. (Patient taking differently: Take 400 mg by mouth 2 (two) times daily with a meal. ) 180 tablet 3   Olopatadine HCl (PATADAY) 0.7 % SOLN Pataday Once Daily Relief 0.7 % eye drops  INSTILL 1 DROP INTO BOTH EYES EVERY DAY     pregabalin (LYRICA) 150 MG capsule Take 150 mg by mouth in the morning, at noon, in the evening, and at bedtime.      RESTASIS MULTIDOSE 0.05 % ophthalmic emulsion 1 drop 2 (two) times daily.     rosuvastatin (CRESTOR) 10 MG tablet Take 10 mg by mouth every evening.      tiZANidine (ZANAFLEX) 4 MG tablet Take 4 mg by mouth every 8 (eight) hours as needed.     Vitamin D, Ergocalciferol, (DRISDOL) 50000 units CAPS capsule Take 50,000 Units by mouth every Wednesday.      No current facility-administered medications for this encounter.    Allergies  Allergen Reactions   Short Ragweed Pollen Ext Cough and Other (See Comments)    Other reaction(s): Eye Redness   Sulfamethoxazole Hives    GI Upset   Vancomycin Hives    Social History   Socioeconomic History   Marital status: Married    Spouse name: Not on file   Number of children: Not on file   Years of education: Not on file   Highest education level: Not on file  Occupational History   Not on file  Tobacco Use   Smoking status: Current Every Day Smoker    Packs/day: 1.00    Years: 30.00    Pack years: 30.00    Types: Cigarettes   Smokeless tobacco: Never Used  Substance and Sexual Activity   Alcohol use: Not Currently    Comment: Prior alcohol abuse - quit several years ago.   Drug use: No   Sexual activity: Yes    Birth control/protection: Post-menopausal  Other Topics Concern   Not on file  Social History Narrative   Not on file   Social Determinants of Health   Financial Resource Strain:    Difficulty of Paying Living  Expenses:   Food Insecurity:    Worried About Charity fundraiser in the Last Year:    Arboriculturist in the Last Year:   Transportation Needs:    Film/video editor (Medical):    Lack of Transportation (Non-Medical):   Physical Activity:    Days of Exercise per Week:    Minutes of Exercise per Session:   Stress:  Feeling of Stress :   Social Connections:    Frequency of Communication with Friends and Family:    Frequency of Social Gatherings with Friends and Family:    Attends Religious Services:    Active Member of Clubs or Organizations:    Attends Music therapist:    Marital Status:   Intimate Partner Violence:    Fear of Current or Ex-Partner:    Emotionally Abused:    Physically Abused:    Sexually Abused:     Family History  Adopted: Yes  Family history unknown: Yes    ROS- All systems are reviewed and negative except as per the HPI above  Physical Exam: Vitals:   03/14/20 1330  BP: 126/68  Pulse: 63  Weight: 110.1 kg  Height: 5\' 9"  (1.753 m)   Wt Readings from Last 3 Encounters:  03/14/20 110.1 kg  12/18/19 109.8 kg  10/03/19 107.5 kg    Labs: Lab Results  Component Value Date   NA 140 11/16/2018   K 4.1 11/16/2018   CL 102 11/16/2018   CO2 22 11/16/2018   GLUCOSE 113 (H) 11/16/2018   BUN 17 11/16/2018   CREATININE 0.89 11/16/2018   CALCIUM 9.6 11/16/2018   PHOS 1.6 (L) 06/03/2018   MG 1.9 06/03/2018   Lab Results  Component Value Date   INR 1.1 ratio (H) 03/13/2010   Lab Results  Component Value Date   CHOL 119 06/02/2018   HDL 39 (L) 06/02/2018   LDLCALC 59 06/02/2018   TRIG 104 06/02/2018     GEN- The patient is well appearing, alert and oriented x 3 today.   Head- normocephalic, atraumatic Eyes-  Sclera clear, conjunctiva pink Ears- hearing intact Oropharynx- clear Neck- supple, no JVP Lymph- no cervical lymphadenopathy Lungs- Clear to ausculation bilaterally, normal work of  breathing Heart- irregular rate and rhythm, no murmurs, rubs or gallops, PMI not laterally displaced GI- soft, NT, ND, + BS Extremities- no clubbing, cyanosis, or edema MS- no significant deformity or atrophy Skin- no rash or lesion Psych- euthymic mood, full affect Neuro- strength and sensation are intact  EKG-atrial paced rhythm at 63 bpm, pr int 200 ms, qrs int 150 ms, qtc 495 ms Device noted reviewed     Assessment and Plan: 1. Paroxysmal  afib Pt is happy with her current treatment of afib She is asymptomatic with most of the device alerts of afib  She will continue with Multaq 400 mg bid and metoprolol 50 mg bid  She is not interested with ablation or change in antiarrythmic at this time   2. CHA2DS2VASc score of at least 2 Continue  eliquis 5 mg bid   F/u with Dr. Curt Bears in August per recall   Geroge Baseman. Kymberley Raz, [facility] [address] [place], [place] [address] [phone]

## 2020-03-18 ENCOUNTER — Emergency Department (HOSPITAL_COMMUNITY)
Admission: EM | Admit: 2020-03-18 | Discharge: 2020-03-18 | Discharge status: Against Medical Advice | Payer: BC Managed Care – PPO | Attending: Emergency Medicine | Admitting: Emergency Medicine

## 2020-03-18 ENCOUNTER — Other Ambulatory Visit: Payer: Self-pay

## 2020-03-18 ENCOUNTER — Encounter (HOSPITAL_COMMUNITY): Payer: Self-pay | Admitting: Emergency Medicine

## 2020-03-18 DIAGNOSIS — R799 Abnormal finding of blood chemistry, unspecified: Secondary | ICD-10-CM | POA: Insufficient documentation

## 2020-03-18 DIAGNOSIS — Z5321 Procedure and treatment not carried out due to patient leaving prior to being seen by health care provider: Secondary | ICD-10-CM | POA: Insufficient documentation

## 2020-03-18 DIAGNOSIS — R509 Fever, unspecified: Secondary | ICD-10-CM | POA: Insufficient documentation

## 2020-03-18 LAB — CBC
HCT: 41.5 % (ref 36.0–46.0)
Hemoglobin: 13.6 g/dL (ref 12.0–15.0)
MCH: 32.2 pg (ref 26.0–34.0)
MCHC: 32.8 g/dL (ref 30.0–36.0)
MCV: 98.3 fL (ref 80.0–100.0)
Platelets: 129 10*3/uL — ABNORMAL LOW (ref 150–400)
RBC: 4.22 MIL/uL (ref 3.87–5.11)
RDW: 13.4 % (ref 11.5–15.5)
WBC: 8.9 10*3/uL (ref 4.0–10.5)
nRBC: 0 % (ref 0.0–0.2)

## 2020-03-18 LAB — COMPREHENSIVE METABOLIC PANEL
ALT: 37 U/L (ref 0–44)
AST: 48 U/L — ABNORMAL HIGH (ref 15–41)
Albumin: 3.7 g/dL (ref 3.5–5.0)
Alkaline Phosphatase: 93 U/L (ref 38–126)
Anion gap: 12 (ref 5–15)
BUN: 37 mg/dL — ABNORMAL HIGH (ref 6–20)
CO2: 24 mmol/L (ref 22–32)
Calcium: 9.1 mg/dL (ref 8.9–10.3)
Chloride: 100 mmol/L (ref 98–111)
Creatinine, Ser: 1.04 mg/dL — ABNORMAL HIGH (ref 0.44–1.00)
GFR calc Af Amer: >60 mL/min (ref >60)
GFR calc non Af Amer: 59 mL/min — ABNORMAL LOW (ref >60)
Glucose, Bld: 137 mg/dL — ABNORMAL HIGH (ref 70–99)
Potassium: 3.5 mmol/L (ref 3.5–5.1)
Sodium: 136 mmol/L (ref 135–145)
Total Bilirubin: 0.7 mg/dL (ref 0.3–1.2)
Total Protein: 7.5 g/dL (ref 6.5–8.1)

## 2020-03-18 LAB — LIPASE, BLOOD: Lipase: 38 U/L (ref 11–51)

## 2020-03-18 MED ORDER — SODIUM CHLORIDE 0.9% FLUSH: 3.0000 mL | Freq: Once | INTRAVENOUS | Status: DC

## 2020-03-18 NOTE — ED Triage Notes (Signed)
Pt reports saw her PCP on Wed. Since having chills, fevers. Reports that she was called by her PCP on Friday and told to go Ed for elevated CRT and probaly dehydrated. Pt c/o pain in her butt

## 2020-03-18 NOTE — ED Notes (Signed)
Pt has a urine cup and aware sample is needed

## 2020-03-18 NOTE — ED Notes (Signed)
Attempted to perform blood draw in triage but was unsuccessful due to difficult to locate access.

## 2020-03-21 MED ORDER — METOPROLOL TARTRATE 25 MG PO TABS: 25.0000 mg | ORAL_TABLET | Freq: Two times a day (BID) | ORAL | 3 refills | Status: DC

## 2020-05-14 ENCOUNTER — Ambulatory Visit (INDEPENDENT_AMBULATORY_CARE_PROVIDER_SITE_OTHER): Payer: BC Managed Care – PPO | Admitting: *Deleted

## 2020-05-14 DIAGNOSIS — I495 Sick sinus syndrome: Secondary | ICD-10-CM | POA: Diagnosis not present

## 2020-05-14 LAB — CUP PACEART REMOTE DEVICE CHECK
Battery Remaining Longevity: 129 mo
Battery Remaining Percentage: 95.5 %
Battery Voltage: 3.01 V
Brady Statistic AP VP Percent: 1 %
Brady Statistic AP VS Percent: 76 %
Brady Statistic AS VP Percent: 1 %
Brady Statistic AS VS Percent: 23 %
Brady Statistic RA Percent Paced: 71 %
Brady Statistic RV Percent Paced: 1.9 %
Date Time Interrogation Session: 20210809021740
Implantable Lead Implant Date: 20191031
Implantable Lead Implant Date: 20191031
Implantable Lead Location: 753859
Implantable Lead Location: 753860
Implantable Pulse Generator Implant Date: 20191031
Lead Channel Impedance Value: 540 Ohm
Lead Channel Impedance Value: 610 Ohm
Lead Channel Pacing Threshold Amplitude: 0.5 V
Lead Channel Pacing Threshold Amplitude: 0.5 V
Lead Channel Pacing Threshold Pulse Width: 0.5 ms
Lead Channel Pacing Threshold Pulse Width: 0.5 ms
Lead Channel Sensing Intrinsic Amplitude: 12 mV
Lead Channel Sensing Intrinsic Amplitude: 4.8 mV
Lead Channel Setting Pacing Amplitude: 0.75 V
Lead Channel Setting Pacing Amplitude: 1.5 V
Lead Channel Setting Pacing Pulse Width: 0.5 ms
Lead Channel Setting Sensing Sensitivity: 2 mV
Pulse Gen Model: 2272
Pulse Gen Serial Number: 9079034

## 2020-05-14 MED ORDER — METOPROLOL TARTRATE 25 MG PO TABS: 25.0000 mg | ORAL_TABLET | Freq: Two times a day (BID) | ORAL | 3 refills | Status: DC

## 2020-05-15 NOTE — Progress Notes (Signed)
Remote pacemaker transmission.   

## 2020-05-16 MED ORDER — METOPROLOL TARTRATE 25 MG PO TABS: 25.0000 mg | ORAL_TABLET | Freq: Two times a day (BID) | ORAL | 3 refills | Status: DC

## 2020-05-16 NOTE — Addendum Note (Signed)
Addended by: Willeen Cass A on: 05/16/2020 09:10 AM   Modules accepted: Orders

## 2020-06-28 ENCOUNTER — Encounter: Payer: Self-pay | Admitting: Cardiology

## 2020-06-28 ENCOUNTER — Ambulatory Visit: Payer: BC Managed Care – PPO | Admitting: Cardiology

## 2020-06-28 ENCOUNTER — Other Ambulatory Visit: Payer: Self-pay

## 2020-06-28 VITALS — BP 106/80 | HR 82 | Ht 69.0 in | Wt 249.0 lb

## 2020-06-28 DIAGNOSIS — I480 Paroxysmal atrial fibrillation: Secondary | ICD-10-CM

## 2020-06-28 MED ORDER — METOPROLOL SUCCINATE ER 100 MG PO TB24: 100.0000 mg | ORAL_TABLET | Freq: Every day | ORAL | 3 refills | Status: DC

## 2020-06-28 NOTE — Progress Notes (Signed)
Electrophysiology Office Note   Date:  06/28/2020   ID:  [name], DOB 08-29-1961, MRN [id number]  PCP:  Aletha Halim., PA-C  Cardiologist:  Johnsie Cancel Primary Electrophysiologist:  Michaela Shankel Meredith Leeds, MD    No chief complaint on file.    History of Present Illness: [name] is a 59 y.o. 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 Presenting today for electrophysiology evaluation.  She has a history of tobacco abuse, alcohol abuse, cirrhosis, B12 deficiency, aortic atherosclerosis, and coronary disease by CT scan, COPD, hypothyroidism, hyperlipidemia.  She presented to the hospital in August 2019 with a brown out episode.  The morning of the event, she woke up feeling well at 4 AM prior to going to work.  She felt more tired than usual.  She works in a [employer].  She was standing in line when she felt like a table that collapsed.  Her legs went underneath her.  She did not trip or lose her balance.  She nearly passed out but did not completely lose consciousness.  In the emergency room she was noted to be intermittently bradycardic into the mid 40s as well as tachycardic into the 120s which appeared to be atrial flutter/fibrillation.  She was noted to have atrial fibrillation and atrial flutter while she was hospitalized.  She had intermittent 3.4-second pauses.  She was put on IV amiodarone during her hospitalization.  She was started on Eliquis.  She was discharged without rhythm control.  She is now status post [place] dual-chamber pacemaker implanted 07/28/2018.  Today, denies symptoms of palpitations, chest pain, shortness of breath, orthopnea, PND, lower extremity edema, claudication, dizziness, presyncope, syncope, bleeding, or neurologic sequela. The patient is tolerating medications without difficulties.  She continues to go in and out of atrial fibrillation and atrial flutter.   She currently feels fatigued and weak with some mild shortness of breath.  Aside from that she is unaware of her arrhythmia.  She does wish to stay in normal rhythm, though she is hesitant about ablation.  She is already on Multaq.  Past Medical History:  Diagnosis Date   Alcohol abuse    a. quit several years ago.   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   pacemaker insertion for tachy/brady syndrome   Spinal cord stimulator status    Spinal stenosis    Tobacco abuse    Past Surgical History:  Procedure Laterality Date   ANKLE SURGERY     CERVICAL CONE BIOPSY     CESAREAN SECTION     COLONOSCOPY     HERNIA REPAIR     KNEE SURGERY     x5   ORIF ANKLE FRACTURE Left 06/02/2018   Procedure: OPEN REDUCTION INTERNAL FIXATION (ORIF) ANKLE FRACTURE;  Surgeon: Wylene Simmer, MD;  Location: [facility];  Service: Orthopedics;  Laterality: Left;   ORIF ANKLE FRACTURE Left 08/26/2018   Procedure: Left ankle syndesmosis OPEN REDUCTION INTERNAL FIXATION (ORIF)/ reconstruction of deltoid ligament;  Surgeon: Wylene Simmer, MD;  Location: [place];  Service: Orthopedics;  Laterality: Left;   PACEMAKER IMPLANT N/A 08/05/2018   Procedure: PACEMAKER IMPLANT;  Surgeon: Constance Haw, MD;  Location: [place];  Service: Cardiovascular;  Laterality: N/A;   POLYPECTOMY     SPINAL CORD STIMULATOR INSERTION N/A 05/07/2016   Procedure: LUMBAR SPINAL CORD STIMULATOR INSERTION;  Surgeon:  Melina Schools, MD;  Location: [place];  Service: Orthopedics;  Laterality: N/A;     Current Outpatient Medications  Medication Sig Dispense Refill   albuterol (PROVENTIL HFA;VENTOLIN HFA) 108 (90 Base) MCG/ACT inhaler Inhale 2 puffs into the lungs every 6 (six) hours as needed for wheezing or shortness of breath.      B Complex Vitamins (B COMPLEX PO) Take 1 tablet by mouth daily.     BREO ELLIPTA 200-25 MCG/INH AEPB Inhale 1 puff into the lungs daily.  11   ELIQUIS 5 MG TABS tablet TAKE 1 TABLET BY MOUTH TWICE A DAY 180 tablet 1   famotidine (PEPCID) 20 MG tablet Take 20 mg by mouth in the morning, at noon, in the evening, and at bedtime.   0   fluticasone (FLONASE) 50 MCG/ACT nasal spray Place 2 sprays into both nostrils daily as needed for allergies (seasonal allergies).   11   folic acid (FOLVITE) 1 MG tablet Take 1 mg by mouth daily.      hydrochlorothiazide (HYDRODIURIL) 25 MG tablet Take 25 mg by mouth daily.  1   HYDROmorphone (DILAUDID) 4 MG tablet Take 4 mg by mouth 5 (five) times daily as needed.     levothyroxine (SYNTHROID) 25 MCG tablet Take 25 mcg by mouth daily before breakfast.      lisinopril (ZESTRIL) 10 MG tablet Take 20 mg by mouth 2 (two) times daily.     metaxalone (SKELAXIN) 800 MG tablet Take 800 mg by mouth 4 (four) times daily as needed.     MULTAQ 400 MG tablet TAKE 1 TABLET (400 MG TOTAL) BY MOUTH 2 (TWO) TIMES DAILY WITH A MEAL. (Patient taking differently: Take 400 mg by mouth 2 (two) times daily with a meal. ) 180 tablet 3   Olopatadine HCl (PATADAY) 0.7 % SOLN Pataday Once Daily Relief 0.7 % eye drops  INSTILL 1 DROP INTO BOTH EYES EVERY DAY     pregabalin (LYRICA) 150 MG capsule Take 150 mg by mouth in the morning, at noon, in the evening, and at bedtime.      RESTASIS MULTIDOSE 0.05 % ophthalmic emulsion 1 drop 2 (two) times daily.     rosuvastatin (CRESTOR) 10 MG tablet Take 10 mg by mouth every evening.      tiZANidine (ZANAFLEX) 4 MG  tablet Take 4 mg by mouth every 8 (eight) hours as needed.     Vitamin D, Ergocalciferol, (DRISDOL) 50000 units CAPS capsule Take 50,000 Units by mouth every Wednesday.      metoprolol succinate (TOPROL-XL) 100 MG 24 hr tablet Take 1 tablet (100 mg total) by mouth daily. Take with or immediately following a meal. 30 tablet 3   No current facility-administered medications for this visit.    Allergies:   Short ragweed pollen ext, Sulfamethoxazole, and Vancomycin   Social History:  The patient  reports that she has been smoking cigarettes. She has a 30.00 pack-year smoking history. She  has never used smokeless tobacco. She reports previous alcohol use. She reports that she does not use drugs.   Family History:  The patient's She was adopted. Family history is unknown by patient.   ROS:  Please see the history of present illness.   Otherwise, review of systems is positive for none.   All other systems are reviewed and negative.   PHYSICAL EXAM: VS:  BP 106/80    Pulse 82    Ht 5\' 9"  (1.753 m)    Wt 249 lb (112.9 kg)    LMP  (LMP Unknown)    SpO2 96%    BMI 36.77 kg/m  , BMI Body mass index is 36.77 kg/m. GEN: Well nourished, well developed, in no acute distress  HEENT: normal  Neck: no JVD, carotid bruits, or masses Cardiac: irregular; no murmurs, rubs, or gallops,no edema  Respiratory:  clear to auscultation bilaterally, normal work of breathing GI: soft, nontender, nondistended, + BS MS: no deformity or atrophy  Skin: warm and dry, device site well healed Neuro:  Strength and sensation are intact Psych: euthymic mood, full affect  EKG:  EKG is ordered today. Personal review of the ekg ordered shows atrial flutter, rate 99  Personal review of the device interrogation today. Results in [place]: 03/18/2020: ALT 37; BUN 37; Creatinine, Ser 1.04; Hemoglobin 13.6; Platelets 129; Potassium 3.5; Sodium 136    Lipid Panel     Component Value Date/Time   CHOL 119  06/02/2018 0325   TRIG 104 06/02/2018 0325   HDL 39 (L) 06/02/2018 0325   CHOLHDL 3.1 06/02/2018 0325   VLDL 21 06/02/2018 0325   LDLCALC 59 06/02/2018 0325     Wt Readings from Last 3 Encounters:  06/28/20 249 lb (112.9 kg)  03/14/20 242 lb 12.8 oz (110.1 kg)  12/18/19 242 lb (109.8 kg)      Other studies Reviewed: Additional studies/ records that were reviewed today include: TTE 06/01/18  Review of the above records today demonstrates:  - Left ventricle: Mild diffuse hypo kinesis with abnormal septal   motion likely from LBBB. The cavity size was mildly dilated. Wall   thickness was normal. Systolic function was mildly reduced. The   estimated ejection fraction was in the range of 45% to 50%. Left   ventricular diastolic function parameters were normal. - Mitral valve: Calcified annulus. Mildly thickened leaflets . - Left atrium: The atrium was moderately dilated. - Right ventricle: The cavity size was mildly dilated. - Right atrium: The atrium was moderately dilated. - Atrial septum: No defect or patent foramen ovale was identified.  Myoview 07/29/18  The left ventricular ejection fraction is moderately decreased (30-44%).  Nuclear stress EF is calculated at 43% but appears better visually. There is paradoxical septal motion due to LBBB  There was no ST segment deviation noted during stress.  There is a medium defect of moderate severity present in the mid anteroseptal, apical anterior, apical septal and apex location. The defect is non-reversible and likely related to breast attenuation artifact. No ischemia noted.  This is a low risk study.  30 day monitor 07/15/18 - personally reviewed NSR PAF rapid rates 160 Conversion pauses greater than 3 seconds  ASSESSMENT AND PLAN:  1.  Paroxysmal atrial fibrillation/atrial flutter: Currently on Eliquis and Multaq (ECG monitoring performed for high risk medication).  CHA2DS2-VASc of 2.  I feel that she would be most  appropriate for ablation, though she is unsure about this.  We Elaysha Bevard  give her information and Chene Kasinger call her back in 2 weeks.  She is in atrial flutter today and is going in and out of arrhythmias quite often.  As her rates are high, we Ericia Moxley stop her metoprolol and start her on 100 mg of Toprol-XL.  2.  Chronic systolic heart failure: Ejection fraction 45 to 50%.  No obvious volume overload.  No changes.    3.  Coronary atherosclerosis: Low risk Myoview.  No changes.    4.  Tachybradycardia syndrome: Status post [employer] dual-chamber pacemaker implanted 08/05/2018.  Device functioning appropriately.  No changes.  Current medicines are reviewed at length with the patient today.   The patient does not have concerns regarding her medicines.  The following changes were made today: Stop metoprolol, start Toprol-XL  Labs/ tests ordered today include:  Orders Placed This Encounter  Procedures   EKG 12-Lead    Disposition:   FU with Edward Trevino 3 months  Signed, Sloane Palmer Meredith Leeds, MD  06/28/2020 3:54 PM     [facility] [address] [place] [address] [phone] (office) [phone] (fax)

## 2020-06-28 NOTE — Patient Instructions (Addendum)
Medication Instructions:  Your physician has recommended you make the following change in your medication:  1. STOP Metoprolol Tartrate (Lopressor) 2 START Metoprolol Succinate (Toprol) 100 mg once daily  *If you need a refill on your cardiac medications before your next appointment, please call your pharmacy*   Lab Work: None ordered   Testing/Procedures: None ordered   Follow-Up: At [facility], you and your health needs are our priority.  As part of our continuing mission to provide you with exceptional heart care, we have created designated Provider Care Teams.  These Care Teams include your primary Cardiologist (physician) and Advanced Practice Providers (APPs -  Physician Assistants and Nurse Practitioners) who all work together to provide you with the care you need, when you need it.  Your next appointment:   3 month(s)  The format for your next appointment:   In Person  Provider:   Allegra Lai, MD   Other Instructions   Cardiac Ablation  Cardiac ablation is a procedure to stop some heart tissue from causing problems. The heart has many electrical connections. Sometimes these connections make the heart beat very fast or irregularly. Removing some problem areas can improve the heart rhythm or make it normal. What happens before the procedure?  Follow instructions from your doctor about what you cannot eat or drink.  Ask your doctor about: ? Changing or stopping your normal medicines. This is important if you take diabetes medicines or blood thinners. ? Taking medicines such as aspirin and ibuprofen. These medicines can thin your blood. Do not take these medicines before your procedure if your doctor tells you not to.  Plan to have someone take you home.  If you will be going home right after the procedure, plan to have someone with you for 24 hours. What happens during the procedure?  To lower your risk of infection: ? Your health care team will wash or  sanitize their hands. ? Your skin will be washed with soap. ? Hair may be removed from your neck or groin.  An IV tube will be put into one of your veins.  You will be given a medicine to help you relax (sedative).  Skin on your neck or groin will be numbed.  A cut (incision) will be made in your neck or groin.  A needle will be put through your cut and into a vein in your neck or groin.  A tube (catheter) will be put into the needle. The tube will be moved to your heart. X-rays (fluoroscopy) will be used to help guide the tube.  Small devices (electrodes) on the tip of the tube will send out electrical currents.  Dye may be put through the tube. This helps your surgeon see your heart.  Electrical energy will be used to scar (ablate) some heart tissue. Your surgeon may use: ? Heat (radiofrequency energy). ? Laser energy. ? Extreme cold (cryoablation).  The tube will be taken out.  Pressure will be held on your cut. This helps stop bleeding.  A bandage (dressing) will be put on your cut. The procedure may vary. What happens after the procedure?  You will be monitored until your medicines have worn off.  Your cut will be watched for bleeding. You will need to lie still for a few hours.  Do not drive for 24 hours or as long as your doctor tells you. Summary  Cardiac ablation is a procedure to stop some heart tissue from causing problems.  Electrical energy will be  used to scar (ablate) some heart tissue. This information is not intended to replace advice given to you by your health care provider. Make sure you discuss any questions you have with your health care provider. Document Revised: 09/04/2017 Document Reviewed: 08/11/2016 Elsevier Patient Education  2020 [employer].

## 2020-07-02 ENCOUNTER — Other Ambulatory Visit: Payer: Self-pay | Admitting: *Deleted

## 2020-07-02 MED ORDER — APIXABAN 5 MG PO TABS: 5.0000 mg | ORAL_TABLET | Freq: Two times a day (BID) | ORAL | 1 refills | Status: DC

## 2020-07-02 NOTE — Telephone Encounter (Signed)
Prescription refill request for Eliquis received.  Last office visit: [place], 06/28/2020 Scr: 1.04, 03/18/2020 Age: 59  Weight: 112.9 kg   Prescription refill sent.

## 2020-08-13 ENCOUNTER — Ambulatory Visit (INDEPENDENT_AMBULATORY_CARE_PROVIDER_SITE_OTHER): Payer: BC Managed Care – PPO

## 2020-08-13 DIAGNOSIS — I495 Sick sinus syndrome: Secondary | ICD-10-CM

## 2020-08-13 LAB — CUP PACEART REMOTE DEVICE CHECK
Battery Remaining Longevity: 127 mo
Battery Remaining Percentage: 95.5 %
Battery Voltage: 3.01 V
Brady Statistic AP VP Percent: 1.6 %
Brady Statistic AP VS Percent: 85 %
Brady Statistic AS VP Percent: 1 %
Brady Statistic AS VS Percent: 13 %
Brady Statistic RA Percent Paced: 76 %
Brady Statistic RV Percent Paced: 5.5 %
Date Time Interrogation Session: 20211108010012
Implantable Lead Implant Date: 20191031
Implantable Lead Implant Date: 20191031
Implantable Lead Location: 753859
Implantable Lead Location: 753860
Implantable Pulse Generator Implant Date: 20191031
Lead Channel Impedance Value: 540 Ohm
Lead Channel Impedance Value: 660 Ohm
Lead Channel Pacing Threshold Amplitude: 0.5 V
Lead Channel Pacing Threshold Amplitude: 0.5 V
Lead Channel Pacing Threshold Pulse Width: 0.5 ms
Lead Channel Pacing Threshold Pulse Width: 0.5 ms
Lead Channel Sensing Intrinsic Amplitude: 12 mV
Lead Channel Sensing Intrinsic Amplitude: 4 mV
Lead Channel Setting Pacing Amplitude: 0.75 V
Lead Channel Setting Pacing Amplitude: 1.5 V
Lead Channel Setting Pacing Pulse Width: 0.5 ms
Lead Channel Setting Sensing Sensitivity: 2 mV
Pulse Gen Model: 2272
Pulse Gen Serial Number: 9079034

## 2020-08-14 NOTE — Progress Notes (Signed)
Remote pacemaker transmission.   

## 2020-08-28 ENCOUNTER — Telehealth: Payer: Self-pay | Admitting: *Deleted

## 2020-08-28 DIAGNOSIS — I480 Paroxysmal atrial fibrillation: Secondary | ICD-10-CM

## 2020-08-28 DIAGNOSIS — Z01812 Encounter for preprocedural laboratory examination: Secondary | ICD-10-CM

## 2020-08-28 NOTE — Telephone Encounter (Signed)
Procedure instructions reviewed w/ pt.   Aware I will send via mychart. Aware office will call to arrange pre procedure CT testing. Covid screening scheduled for 12/27. Aware office will call to arrange post procedure follow up. Patient verbalized understanding and agreeable to plan.

## 2020-09-18 ENCOUNTER — Ambulatory Visit: Payer: BC Managed Care – PPO | Admitting: Cardiology

## 2020-09-18 ENCOUNTER — Other Ambulatory Visit: Payer: Self-pay

## 2020-09-18 ENCOUNTER — Encounter: Payer: Self-pay | Admitting: Cardiology

## 2020-09-18 VITALS — BP 110/58 | HR 66 | Ht 69.0 in | Wt 249.0 lb

## 2020-09-18 DIAGNOSIS — I480 Paroxysmal atrial fibrillation: Secondary | ICD-10-CM | POA: Diagnosis not present

## 2020-09-18 DIAGNOSIS — Z01812 Encounter for preprocedural laboratory examination: Secondary | ICD-10-CM

## 2020-09-18 NOTE — H&P (View-Only) (Signed)
Electrophysiology Office Note   Date:  09/18/2020   ID:  [name], DOB Jul 27, 1961, MRN [id number]  PCP:  Aletha Halim., PA-C  Cardiologist:  Johnsie Cancel Primary Electrophysiologist:  Lota Leamer Meredith Leeds, MD    No chief complaint on file.    History of Present Illness: [name] is a 59 y.o. 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 Presenting today for electrophysiology evaluation.    Bacot abuse, alcohol abuse, cirrhosis, B12 deficiency, aortic atherosclerosis, coronary artery disease by CT scan, COPD, hypothyroidism, hyperlipidemia.  She presented to the hospital August 2019 with a brown out episode.  The morning of the event she woke up at 4 AM to go to work.  She felt more tired than usual.  She collapsed after standing in a long line.  She nearly passed out but did not lose consciousness.  In the emergency room she was noted to be bradycardic into the 40s as well as tachycardic at times in the 120s with atrial fibrillation/flutter.  She was put on IV amiodarone and started on Eliquis.  She is now status post [place] dual-chamber pacemaker 07/28/2018.  Today, denies symptoms of palpitations, chest pain, shortness of breath, orthopnea, PND, lower extremity edema, claudication, dizziness, presyncope, syncope, bleeding, or neurologic sequela. The patient is tolerating medications without difficulties.  Since last being seen she has done well.  She continues to have short episodes of atrial fibrillation.  She is able to do all of her daily activities.  She is ready for her ablation later this month.  Past Medical History:  Diagnosis Date  . Alcohol abuse    a. quit several years ago.  . Allergy   . Anemia    younger  . Arthritis    feet,knees  . B12 deficiency   . BPPV (benign paroxysmal positional vertigo)   . Cirrhosis ([place])   . Complication of anesthesia    "slow to wake"  . Constipation     on movantik- stools regular and soft on this  . COPD (chronic obstructive pulmonary disease) ([place])   . Foot drop   . GERD (gastroesophageal reflux disease)   . Hyperlipidemia   . Hypothyroidism   . Neuromuscular disorder (HCC)    idiopathic neuropathy  . 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 Spinal cord stimulator status   . Spinal stenosis   . Tobacco abuse    Past Surgical History:  Procedure Laterality Date  . ANKLE SURGERY    . CERVICAL CONE BIOPSY    . CESAREAN SECTION    . COLONOSCOPY    . HERNIA REPAIR    . KNEE SURGERY     x5  . ORIF ANKLE FRACTURE Left 06/02/2018   Procedure: OPEN REDUCTION INTERNAL FIXATION (ORIF) ANKLE FRACTURE;  Surgeon: Wylene Simmer, MD;  Location: [facility];  Service: Orthopedics;  Laterality: Left;  . ORIF ANKLE FRACTURE Left 08/26/2018   Procedure: Left ankle syndesmosis OPEN REDUCTION INTERNAL FIXATION (ORIF)/ reconstruction of deltoid ligament;  Surgeon: Wylene Simmer, MD;  Location: [facility];  Service: Orthopedics;  Laterality: Left;  . PACEMAKER IMPLANT N/A 08/05/2018   Procedure: PACEMAKER IMPLANT;  Surgeon: Constance Haw, MD;  Location: [facility];  Service: Cardiovascular;  Laterality: N/A;  . POLYPECTOMY    . SPINAL CORD STIMULATOR INSERTION N/A 05/07/2016   Procedure: LUMBAR SPINAL CORD STIMULATOR  INSERTION;  Surgeon: Melina Schools, MD;  Location: [facility];  Service: Orthopedics;  Laterality: N/A;     Current Outpatient Medications  Medication Sig Dispense Refill  . albuterol (PROVENTIL HFA;VENTOLIN HFA) 108 (90 Base) MCG/ACT inhaler Inhale 2 puffs into the lungs every 6 (six) hours as needed for wheezing or shortness of breath.     Marland Kitchen apixaban (ELIQUIS) 5 MG TABS tablet Take 1 tablet (5 mg total) by mouth 2 (two) times daily. 180 tablet 1  . B Complex Vitamins (B COMPLEX PO) Take 1 tablet by mouth daily.    Marland Kitchen BREO ELLIPTA 200-25 MCG/INH AEPB Inhale  1 puff into the lungs daily.  11  . famotidine (PEPCID) 20 MG tablet Take 20 mg by mouth in the morning, at noon, in the evening, and at bedtime.   0  . fluticasone (FLONASE) 50 MCG/ACT nasal spray Place 2 sprays into both nostrils daily as needed for allergies (seasonal allergies).   11  . folic acid (FOLVITE) 1 MG tablet Take 1 mg by mouth daily.     . hydrochlorothiazide (HYDRODIURIL) 25 MG tablet Take 25 mg by mouth daily.  1  . HYDROmorphone (DILAUDID) 4 MG tablet Take 4 mg by mouth 5 (five) times daily as needed.    Marland Kitchen levothyroxine (SYNTHROID) 25 MCG tablet Take 25 mcg by mouth daily before breakfast.     . lisinopril (ZESTRIL) 10 MG tablet Take 20 mg by mouth 2 (two) times daily.    . metaxalone (SKELAXIN) 800 MG tablet Take 800 mg by mouth 4 (four) times daily as needed.    . metoprolol succinate (TOPROL-XL) 100 MG 24 hr tablet Take 1 tablet (100 mg total) by mouth daily. Take with or immediately following a meal. 30 tablet 3  . MULTAQ 400 MG tablet TAKE 1 TABLET (400 MG TOTAL) BY MOUTH 2 (TWO) TIMES DAILY WITH A MEAL. (Patient taking differently: Take 400 mg by mouth 2 (two) times daily with a meal.) 180 tablet 3  . Olopatadine HCl (PATADAY) 0.7 % SOLN Pataday Once Daily Relief 0.7 % eye drops  INSTILL 1 DROP INTO BOTH EYES EVERY DAY    . pregabalin (LYRICA) 150 MG capsule Take 150 mg by mouth in the morning, at noon, in the evening, and at bedtime.     . RESTASIS MULTIDOSE 0.05 % ophthalmic emulsion 1 drop 2 (two) times daily.    . rosuvastatin (CRESTOR) 10 MG tablet Take 10 mg by mouth every evening.     Marland Kitchen tiZANidine (ZANAFLEX) 4 MG tablet Take 4 mg by mouth every 8 (eight) hours as needed.    . Vitamin D, Ergocalciferol, (DRISDOL) 50000 units CAPS capsule Take 50,000 Units by mouth every Wednesday.      No current facility-administered medications for this visit.    Allergies:   Short ragweed pollen ext, Sulfamethoxazole, and Vancomycin   Social History:  The patient  reports that  she has been smoking cigarettes. She has a 30.00 pack-year smoking history. She has never used smokeless tobacco. She reports previous alcohol use. She reports that she does not use drugs.   Family History:  The patient's She was adopted. Family history is unknown by patient.   ROS:  Please see the history of present illness.   Otherwise, review of systems is positive for none.   All other systems are reviewed and negative.   PHYSICAL EXAM: VS:  BP (!) 110/58   Pulse 66   Ht 5\' 9"  (1.753 m)  Wt 249 lb (112.9 kg)   LMP  (LMP Unknown)   SpO2 98%   BMI 36.77 kg/m  , BMI Body mass index is 36.77 kg/m. GEN: Well nourished, well developed, in no acute distress  HEENT: normal  Neck: no JVD, carotid bruits, or masses Cardiac: RRR; no murmurs, rubs, or gallops,no edema  Respiratory:  clear to auscultation bilaterally, normal work of breathing GI: soft, nontender, nondistended, + BS MS: no deformity or atrophy  Skin: warm and dry, device site well healed Neuro:  Strength and sensation are intact Psych: euthymic mood, full affect  EKG:  EKG is not ordered today. Personal review of the ekg ordered 06/28/20 shows atrial fibrillation, ventricular paced  Personal review of the device interrogation today. Results in [place]: 03/18/2020: ALT 37; BUN 37; Creatinine, Ser 1.04; Hemoglobin 13.6; Platelets 129; Potassium 3.5; Sodium 136    Lipid Panel     Component Value Date/Time   CHOL 119 06/02/2018 0325   TRIG 104 06/02/2018 0325   HDL 39 (L) 06/02/2018 0325   CHOLHDL 3.1 06/02/2018 0325   VLDL 21 06/02/2018 0325   LDLCALC 59 06/02/2018 0325     Wt Readings from Last 3 Encounters:  09/18/20 249 lb (112.9 kg)  06/28/20 249 lb (112.9 kg)  03/14/20 242 lb 12.8 oz (110.1 kg)      Other studies Reviewed: Additional studies/ records that were reviewed today include: TTE 06/01/18  Review of the above records today demonstrates:  - Left ventricle: Mild diffuse hypo kinesis  with abnormal septal   motion likely from LBBB. The cavity size was mildly dilated. Wall   thickness was normal. Systolic function was mildly reduced. The   estimated ejection fraction was in the range of 45% to 50%. Left   ventricular diastolic function parameters were normal. - Mitral valve: Calcified annulus. Mildly thickened leaflets . - Left atrium: The atrium was moderately dilated. - Right ventricle: The cavity size was mildly dilated. - Right atrium: The atrium was moderately dilated. - Atrial septum: No defect or patent foramen ovale was identified.  Myoview 07/29/18  The left ventricular ejection fraction is moderately decreased (30-44%).  Nuclear stress EF is calculated at 43% but appears better visually. There is paradoxical septal motion due to LBBB  There was no ST segment deviation noted during stress.  There is a medium defect of moderate severity present in the mid anteroseptal, apical anterior, apical septal and apex location. The defect is non-reversible and likely related to breast attenuation artifact. No ischemia noted.  This is a low risk study.  30 day monitor 07/15/18 - personally reviewed NSR PAF rapid rates 160 Conversion pauses greater than 3 seconds  ASSESSMENT AND PLAN:  1.  Paroxysmal atrial fibrillation/atrial flutter: Currently on Eliquis and Multaq, monitoring for high risk medication.  CHA2DS2-VASc of 2.  Plan for AF ablation 10/02/2020.  Risks and benefits of been discussed include bleeding, tamponade, heart block, stroke, damage to chest organs.  She understands these risks and has agreed to the procedure.  2.  Chronic systolic heart failure: Ejection fraction 45 to 50%.  No obvious volume overload.  No changes.    3.  Coronary atherosclerosis: Low risk Myoview.  No changes.  4.  Tachybradycardia syndrome: Status post [employer] dual-chamber pacemaker implanted 08/05/2018.  Device functioning appropriately.  No changes.    Current medicines  are reviewed at length with the patient today.   The patient does not have concerns regarding her medicines.  The following changes were made today: None  Labs/ tests ordered today include:  No orders of the defined types were placed in this encounter.   Disposition:   FU with Edman Lipsey 3 months  Signed, Dayshawn Irizarry Meredith Leeds, MD  09/18/2020 3:58 PM     [place] [address] [place] [address] [phone] (office) [phone] (fax)

## 2020-09-18 NOTE — Patient Instructions (Signed)
Medication Instructions:  Your physician recommends that you continue on your current medications as directed. Please refer to the Current Medication list given to you today.  *If you need a refill on your cardiac medications before your next appointment, please call your pharmacy*   Lab Work: Pre procedure labs today: BMET & CBC If you have labs (blood work) drawn today and your tests are completely normal, you will receive your results only by: Marland Kitchen MyChart Message (if you have MyChart) OR . A paper copy in the mail If you have any lab test that is abnormal or we need to change your treatment, we will call you to review the results.   Testing/Procedures: None ordered   Follow-Up: At [facility], you and your health needs are our priority.  As part of our continuing mission to provide you with exceptional heart care, we have created designated Provider Care Teams.  These Care Teams include your primary Cardiologist (physician) and Advanced Practice Providers (APPs -  Physician Assistants and Nurse Practitioners) who all work together to provide you with the care you need, when you need it.  We recommend signing up for the patient portal called "MyChart".  Sign up information is provided on this After Visit Summary.  MyChart is used to connect with patients for Virtual Visits (Telemedicine).  Patients are able to view lab/test results, encounter notes, upcoming appointments, etc.  Non-urgent messages can be sent to your provider as well.   To learn more about what you can do with MyChart, go to [email or web address].    Your next appointment:    keep you currently post ablation follow up  The format for your next appointment:   In Person  Provider:   You will follow up in the [facility] located at [facility]. Your provider will be: Roderic Palau, NP or Clint R. [place], PA-C    Thank you for choosing [facility]!!   Trinidad Curet, RN [phone]   Other Instructions  CT INSTRUCTIONS Your cardiac CT will be scheduled at:  [facility] [address] [place], [place] [address] [phone]  Please arrive at the [place] main entrance of [facility] 30 minutes prior to test start time. Proceed to the [facility] Radiology Department (first floor) to check-in and test prep.  Please follow these instructions carefully (unless otherwise directed):  On the Night Before the Test:  Be sure to Drink plenty of water.  Do not consume any caffeinated/decaffeinated beverages or chocolate 12 hours prior to your test.  Do not take any antihistamines 12 hours prior to your test.  On the Day of the Test:  Drink plenty of water. Do not drink any water within one hour of the test.  Do not eat any food 4 hours prior to the test.  You may take your regular medications prior to the test.   Take your metoprolol as usual prior to this test.  HOLD Furosemide/Hydrochlorothiazide morning of the test.  FEMALES- please wear underwire-free bra if available      After the Test:  Drink plenty of water.  After receiving IV contrast, you may experience a mild flushed feeling. This is normal.  On occasion, you may experience a mild rash up to 24 hours after the test. This is not dangerous. If this occurs, you can take Benadryl 25 mg and increase your fluid intake.  If you experience trouble breathing, this can be serious. If it is severe call  [phone] IMMEDIATELY. If it is mild, please call our office.  If you take any of these medications: Glipizide/Metformin, Avandament, Glucavance, please do not take 48 hours after completing test unless otherwise instructed.   Once we have confirmed authorization from your insurance company, we will call you to set up a date and time for your test. Based on how quickly your insurance processes prior authorizations requests, please allow up to 4 weeks to be contacted for  scheduling your Cardiac CT appointment. Be advised that routine Cardiac CT appointments could be scheduled as many as 8 weeks after your provider has ordered it.  For non-scheduling related questions, please contact the cardiac imaging nurse navigator should you have any questions/concerns: Marchia Bond, Cardiac Imaging Nurse Navigator Burley Saver, Interim Cardiac Imaging Nurse [place] [facility] Direct Office Dial: [phone]   For scheduling needs, including cancellations and rescheduling, please call [place], [phone] (temporary number).     Electrophysiology/Ablation Procedure Instructions  You are scheduled for a(n)  ablationon 10/02/20 with Dr.Will Camnitz.  1. Pre procedure testing- A. LAB WORK --- On 09/18/20 for your pre procedure blood work. You do NOT need to be fasting.  B. COVID TEST-- On 10/01/20 @ 8:15 am - This is a Drive Up Visit at [address] [place], [place] [address].  Someone will direct you to the appropriate testing line. Stay in your car and someone will be with you shortly.  After you are tested please go home and self quarantine until the day of your procedure.   2. On the day of your procedure 10/02/20 you will go to [facility] [id number] [place] [address]) at 5:30 am. Dennis Bast will go to the main entrance [address] [employer]) and enter where the [employer] are. Your driver will drop you off and you will head down the hallway to ADMITTING. You may have one support person come in to the hospital with you.  They will be asked to wait in the waiting room.  3. Do not eat or drink after midnight prior to your procedure.  4. Do not miss any doses of your blood thinner prior to the morning of your procedure or your procedure will need to be rescheduled.       Do NOT take any medications the morning of your procedure.  5. Plan for an overnight stay, but you may be discharged  home after your procedure.   If you use your phone frequently bring your phone charger, in case you have to stay.  If you are discharged after your procedure you will need someone to drive you home and be with your for 24 hours after your procedure.  6. You will follow up with the AFIB clinic 4 weeksafter your procedure. You will follow up with Dr. Curt Bears  3 monthsafter your procedure. These appointments will be made for you.  * If you have ANY questions please call the office [phone] and ask for Kinya Meine RNor send me a MyChart message  * Occasionally, EP Studies and ablations can become lengthy. Please make your family aware of this before your procedure starts. Average time ranges from 2-8 hours for EP studies/ablations. Your physician will [place] family after the procedure with the results

## 2020-09-18 NOTE — Progress Notes (Signed)
Electrophysiology Office Note   Date:  09/18/2020   ID:  [name], DOB July 11, 1961, MRN [id number]  PCP:  Aletha Halim., PA-C  Cardiologist:  Johnsie Cancel Primary Electrophysiologist:  Rosmery Duggin Meredith Leeds, MD    No chief complaint on file.    History of Present Illness: [name] is a 59 y.o. 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 Presenting today for electrophysiology evaluation.    Bacot abuse, alcohol abuse, cirrhosis, B12 deficiency, aortic atherosclerosis, coronary artery disease by CT scan, COPD, hypothyroidism, hyperlipidemia.  She presented to the hospital August 2019 with a brown out episode.  The morning of the event she woke up at 4 AM to go to work.  She felt more tired than usual.  She collapsed after standing in a long line.  She nearly passed out but did not lose consciousness.  In the emergency room she was noted to be bradycardic into the 40s as well as tachycardic at times in the 120s with atrial fibrillation/flutter.  She was put on IV amiodarone and started on Eliquis.  She is now status post Moore dual-chamber pacemaker 07/28/2018.  Today, denies symptoms of palpitations, chest pain, shortness of breath, orthopnea, PND, lower extremity edema, claudication, dizziness, presyncope, syncope, bleeding, or neurologic sequela. The patient is tolerating medications without difficulties.  Since last being seen she has done well.  She continues to have short episodes of atrial fibrillation.  She is able to do all of her daily activities.  She is ready for her ablation later this month.  Past Medical History:  Diagnosis Date  . Alcohol abuse    a. quit several years ago.  . Allergy   . Anemia    younger  . Arthritis    feet,knees  . B12 deficiency   . BPPV (benign paroxysmal positional vertigo)   . Cirrhosis ([place])   . Complication of anesthesia    "slow to wake"  . Constipation     on movantik- stools regular and soft on this  . COPD (chronic obstructive pulmonary disease) ([place])   . Foot drop   . GERD (gastroesophageal reflux disease)   . Hyperlipidemia   . Hypothyroidism   . Neuromuscular disorder (HCC)    idiopathic neuropathy  . 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 Spinal cord stimulator status   . Spinal stenosis   . Tobacco abuse    Past Surgical History:  Procedure Laterality Date  . ANKLE SURGERY    . CERVICAL CONE BIOPSY    . CESAREAN SECTION    . COLONOSCOPY    . HERNIA REPAIR    . KNEE SURGERY     x5  . ORIF ANKLE FRACTURE Left 06/02/2018   Procedure: OPEN REDUCTION INTERNAL FIXATION (ORIF) ANKLE FRACTURE;  Surgeon: Wylene Simmer, MD;  Location: [facility];  Service: Orthopedics;  Laterality: Left;  . ORIF ANKLE FRACTURE Left 08/26/2018   Procedure: Left ankle syndesmosis OPEN REDUCTION INTERNAL FIXATION (ORIF)/ reconstruction of deltoid ligament;  Surgeon: Wylene Simmer, MD;  Location: [facility];  Service: Orthopedics;  Laterality: Left;  . PACEMAKER IMPLANT N/A 08/05/2018   Procedure: PACEMAKER IMPLANT;  Surgeon: Constance Haw, MD;  Location: [facility];  Service: Cardiovascular;  Laterality: N/A;  . POLYPECTOMY    . SPINAL CORD STIMULATOR INSERTION N/A 05/07/2016   Procedure: LUMBAR SPINAL CORD STIMULATOR  INSERTION;  Surgeon: Melina Schools, MD;  Location: [facility];  Service: Orthopedics;  Laterality: N/A;     Current Outpatient Medications  Medication Sig Dispense Refill  . albuterol (PROVENTIL HFA;VENTOLIN HFA) 108 (90 Base) MCG/ACT inhaler Inhale 2 puffs into the lungs every 6 (six) hours as needed for wheezing or shortness of breath.     Marland Kitchen apixaban (ELIQUIS) 5 MG TABS tablet Take 1 tablet (5 mg total) by mouth 2 (two) times daily. 180 tablet 1  . B Complex Vitamins (B COMPLEX PO) Take 1 tablet by mouth daily.    Marland Kitchen BREO ELLIPTA 200-25 MCG/INH AEPB Inhale  1 puff into the lungs daily.  11  . famotidine (PEPCID) 20 MG tablet Take 20 mg by mouth in the morning, at noon, in the evening, and at bedtime.   0  . fluticasone (FLONASE) 50 MCG/ACT nasal spray Place 2 sprays into both nostrils daily as needed for allergies (seasonal allergies).   11  . folic acid (FOLVITE) 1 MG tablet Take 1 mg by mouth daily.     . hydrochlorothiazide (HYDRODIURIL) 25 MG tablet Take 25 mg by mouth daily.  1  . HYDROmorphone (DILAUDID) 4 MG tablet Take 4 mg by mouth 5 (five) times daily as needed.    Marland Kitchen levothyroxine (SYNTHROID) 25 MCG tablet Take 25 mcg by mouth daily before breakfast.     . lisinopril (ZESTRIL) 10 MG tablet Take 20 mg by mouth 2 (two) times daily.    . metaxalone (SKELAXIN) 800 MG tablet Take 800 mg by mouth 4 (four) times daily as needed.    . metoprolol succinate (TOPROL-XL) 100 MG 24 hr tablet Take 1 tablet (100 mg total) by mouth daily. Take with or immediately following a meal. 30 tablet 3  . MULTAQ 400 MG tablet TAKE 1 TABLET (400 MG TOTAL) BY MOUTH 2 (TWO) TIMES DAILY WITH A MEAL. (Patient taking differently: Take 400 mg by mouth 2 (two) times daily with a meal.) 180 tablet 3  . Olopatadine HCl (PATADAY) 0.7 % SOLN Pataday Once Daily Relief 0.7 % eye drops  INSTILL 1 DROP INTO BOTH EYES EVERY DAY    . pregabalin (LYRICA) 150 MG capsule Take 150 mg by mouth in the morning, at noon, in the evening, and at bedtime.     . RESTASIS MULTIDOSE 0.05 % ophthalmic emulsion 1 drop 2 (two) times daily.    . rosuvastatin (CRESTOR) 10 MG tablet Take 10 mg by mouth every evening.     Marland Kitchen tiZANidine (ZANAFLEX) 4 MG tablet Take 4 mg by mouth every 8 (eight) hours as needed.    . Vitamin D, Ergocalciferol, (DRISDOL) 50000 units CAPS capsule Take 50,000 Units by mouth every Wednesday.      No current facility-administered medications for this visit.    Allergies:   Short ragweed pollen ext, Sulfamethoxazole, and Vancomycin   Social History:  The patient  reports that  she has been smoking cigarettes. She has a 30.00 pack-year smoking history. She has never used smokeless tobacco. She reports previous alcohol use. She reports that she does not use drugs.   Family History:  The patient's She was adopted. Family history is unknown by patient.   ROS:  Please see the history of present illness.   Otherwise, review of systems is positive for none.   All other systems are reviewed and negative.   PHYSICAL EXAM: VS:  BP (!) 110/58   Pulse 66   Ht 5\' 9"  (1.753 m)  Wt 249 lb (112.9 kg)   LMP  (LMP Unknown)   SpO2 98%   BMI 36.77 kg/m  , BMI Body mass index is 36.77 kg/m. GEN: Well nourished, well developed, in no acute distress  HEENT: normal  Neck: no JVD, carotid bruits, or masses Cardiac: RRR; no murmurs, rubs, or gallops,no edema  Respiratory:  clear to auscultation bilaterally, normal work of breathing GI: soft, nontender, nondistended, + BS MS: no deformity or atrophy  Skin: warm and dry, device site well healed Neuro:  Strength and sensation are intact Psych: euthymic mood, full affect  EKG:  EKG is not ordered today. Personal review of the ekg ordered 06/28/20 shows atrial fibrillation, ventricular paced  Personal review of the device interrogation today. Results in [facility]: 03/18/2020: ALT 37; BUN 37; Creatinine, Ser 1.04; Hemoglobin 13.6; Platelets 129; Potassium 3.5; Sodium 136    Lipid Panel     Component Value Date/Time   CHOL 119 06/02/2018 0325   TRIG 104 06/02/2018 0325   HDL 39 (L) 06/02/2018 0325   CHOLHDL 3.1 06/02/2018 0325   VLDL 21 06/02/2018 0325   LDLCALC 59 06/02/2018 0325     Wt Readings from Last 3 Encounters:  09/18/20 249 lb (112.9 kg)  06/28/20 249 lb (112.9 kg)  03/14/20 242 lb 12.8 oz (110.1 kg)      Other studies Reviewed: Additional studies/ records that were reviewed today include: TTE 06/01/18  Review of the above records today demonstrates:  - Left ventricle: Mild diffuse hypo kinesis  with abnormal septal   motion likely from LBBB. The cavity size was mildly dilated. Wall   thickness was normal. Systolic function was mildly reduced. The   estimated ejection fraction was in the range of 45% to 50%. Left   ventricular diastolic function parameters were normal. - Mitral valve: Calcified annulus. Mildly thickened leaflets . - Left atrium: The atrium was moderately dilated. - Right ventricle: The cavity size was mildly dilated. - Right atrium: The atrium was moderately dilated. - Atrial septum: No defect or patent foramen ovale was identified.  Myoview 07/29/18  The left ventricular ejection fraction is moderately decreased (30-44%).  Nuclear stress EF is calculated at 43% but appears better visually. There is paradoxical septal motion due to LBBB  There was no ST segment deviation noted during stress.  There is a medium defect of moderate severity present in the mid anteroseptal, apical anterior, apical septal and apex location. The defect is non-reversible and likely related to breast attenuation artifact. No ischemia noted.  This is a low risk study.  30 day monitor 07/15/18 - personally reviewed NSR PAF rapid rates 160 Conversion pauses greater than 3 seconds  ASSESSMENT AND PLAN:  1.  Paroxysmal atrial fibrillation/atrial flutter: Currently on Eliquis and Multaq, monitoring for high risk medication.  CHA2DS2-VASc of 2.  Plan for AF ablation 10/02/2020.  Risks and benefits of been discussed include bleeding, tamponade, heart block, stroke, damage to chest organs.  She understands these risks and has agreed to the procedure.  2.  Chronic systolic heart failure: Ejection fraction 45 to 50%.  No obvious volume overload.  No changes.    3.  Coronary atherosclerosis: Low risk Myoview.  No changes.  4.  Tachybradycardia syndrome: Status post Saint Jude dual-chamber pacemaker implanted 08/05/2018.  Device functioning appropriately.  No changes.    Current medicines  are reviewed at length with the patient today.   The patient does not have concerns regarding her medicines.  The following changes were made today: None  Labs/ tests ordered today include:  No orders of the defined types were placed in this encounter.   Disposition:   FU with Shonia Skilling 3 months  Signed, Dedria Endres Meredith Leeds, MD  09/18/2020 3:58 PM     [facility] [address] [place] [address] [phone] (office) [phone] (fax)

## 2020-09-19 LAB — CBC
Hematocrit: 43 % (ref 34.0–46.6)
Hemoglobin: 15 g/dL (ref 11.1–15.9)
MCH: 33.6 pg — ABNORMAL HIGH (ref 26.6–33.0)
MCHC: 34.9 g/dL (ref 31.5–35.7)
MCV: 96 fL (ref 79–97)
Platelets: 166 10*3/uL (ref 150–450)
RBC: 4.46 x10E6/uL (ref 3.77–5.28)
RDW: 13.3 % (ref 11.7–15.4)
WBC: 8.7 10*3/uL (ref 3.4–10.8)

## 2020-09-19 LAB — BASIC METABOLIC PANEL
BUN/Creatinine Ratio: 14 (ref 9–23)
BUN: 12 mg/dL (ref 6–24)
CO2: 22 mmol/L (ref 20–29)
Calcium: 9.7 mg/dL (ref 8.7–10.2)
Chloride: 101 mmol/L (ref 96–106)
Creatinine, Ser: 0.86 mg/dL (ref 0.57–1.00)
GFR calc Af Amer: 86 mL/min/{1.73_m2} (ref >59)
GFR calc non Af Amer: 75 mL/min/{1.73_m2} (ref >59)
Glucose: 144 mg/dL — ABNORMAL HIGH (ref 65–99)
Potassium: 3.9 mmol/L (ref 3.5–5.2)
Sodium: 143 mmol/L (ref 134–144)

## 2020-09-24 ENCOUNTER — Telehealth (HOSPITAL_COMMUNITY): Payer: Self-pay | Admitting: Emergency Medicine

## 2020-09-24 NOTE — Telephone Encounter (Signed)
Reaching out to patient to offer assistance regarding upcoming cardiac imaging study; pt verbalizes understanding of appt date/time, parking situation and where to check in, pre-test NPO status and medications ordered, and verified current allergies; name and call back number provided for further questions should they arise Tracy Bond RN Navigator Cardiac Imaging [name] [phone] office [phone] cell   Pt to hold daily lisinopril, flonase, HCTZ, and albuterol inhaler.  Pt has PPM and spinal stimulator.  [name]

## 2020-09-25 ENCOUNTER — Other Ambulatory Visit: Payer: Self-pay

## 2020-09-25 ENCOUNTER — Ambulatory Visit (HOSPITAL_COMMUNITY)
Admission: RE | Admit: 2020-09-25 | Discharge: 2020-09-25 | Disposition: A | Discharge status: Home / Self Care | Payer: BC Managed Care – PPO | Source: Ambulatory Visit | Attending: Cardiology | Admitting: Cardiology

## 2020-09-25 DIAGNOSIS — I480 Paroxysmal atrial fibrillation: Secondary | ICD-10-CM | POA: Insufficient documentation

## 2020-09-25 MED ORDER — IOHEXOL 350 MG/ML SOLN
80.0000 mL | Freq: Once | INTRAVENOUS | Status: AC | PRN
  Administered 2020-09-25: 80 mL via INTRAVENOUS

## 2020-10-01 ENCOUNTER — Other Ambulatory Visit (HOSPITAL_COMMUNITY)
Admission: RE | Admit: 2020-10-01 | Discharge: 2020-10-01 | Disposition: A | Discharge status: Home / Self Care | Payer: BC Managed Care – PPO | Source: Ambulatory Visit | Attending: Cardiology | Admitting: Cardiology

## 2020-10-01 DIAGNOSIS — Z01812 Encounter for preprocedural laboratory examination: Secondary | ICD-10-CM | POA: Insufficient documentation

## 2020-10-01 DIAGNOSIS — F17210 Nicotine dependence, cigarettes, uncomplicated: Secondary | ICD-10-CM | POA: Diagnosis not present

## 2020-10-01 DIAGNOSIS — Z950 Presence of cardiac pacemaker: Secondary | ICD-10-CM | POA: Diagnosis not present

## 2020-10-01 DIAGNOSIS — Z79890 Hormone replacement therapy (postmenopausal): Secondary | ICD-10-CM | POA: Diagnosis not present

## 2020-10-01 DIAGNOSIS — I495 Sick sinus syndrome: Secondary | ICD-10-CM | POA: Diagnosis not present

## 2020-10-01 DIAGNOSIS — I480 Paroxysmal atrial fibrillation: Secondary | ICD-10-CM | POA: Diagnosis not present

## 2020-10-01 DIAGNOSIS — I5022 Chronic systolic (congestive) heart failure: Secondary | ICD-10-CM | POA: Diagnosis not present

## 2020-10-01 DIAGNOSIS — Z20822 Contact with and (suspected) exposure to covid-19: Secondary | ICD-10-CM | POA: Diagnosis not present

## 2020-10-01 DIAGNOSIS — Z881 Allergy status to other antibiotic agents status: Secondary | ICD-10-CM | POA: Diagnosis not present

## 2020-10-01 DIAGNOSIS — Z7901 Long term (current) use of anticoagulants: Secondary | ICD-10-CM | POA: Diagnosis not present

## 2020-10-01 DIAGNOSIS — I2510 Atherosclerotic heart disease of native coronary artery without angina pectoris: Secondary | ICD-10-CM | POA: Diagnosis not present

## 2020-10-01 DIAGNOSIS — Z79899 Other long term (current) drug therapy: Secondary | ICD-10-CM | POA: Diagnosis not present

## 2020-10-01 DIAGNOSIS — I4892 Unspecified atrial flutter: Secondary | ICD-10-CM | POA: Diagnosis not present

## 2020-10-01 DIAGNOSIS — Z882 Allergy status to sulfonamides status: Secondary | ICD-10-CM | POA: Diagnosis not present

## 2020-10-01 LAB — SARS CORONAVIRUS 2 (TAT 6-24 HRS): SARS Coronavirus 2: NEGATIVE

## 2020-10-01 NOTE — Progress Notes (Signed)
Instructed patient on the following items: Arrival time 0530 Nothing to eat or drink after midnight No meds AM of procedure Responsible person to drive you home and stay with you for 24 hrs  Have you missed any doses of anti-coagulant On Eliquis- hasn't missed any doses.   

## 2020-10-01 NOTE — Anesthesia Preprocedure Evaluation (Addendum)
Anesthesia Evaluation  Patient identified by MRN, date of birth, ID band Patient awake    Reviewed: Allergy & Precautions, H&P , NPO status , Patient's Chart, lab work & pertinent test results, reviewed documented beta blocker date and time   Airway Mallampati: III  TM Distance: >3 FB Neck ROM: Full    Dental no notable dental hx. (+) Teeth Intact, Dental Advisory Given   Pulmonary COPD,  COPD inhaler, Current Smoker and Patient abstained from smoking.,    Pulmonary exam normal breath sounds clear to auscultation       Cardiovascular Exercise Tolerance: Good hypertension, Pt. on medications and Pt. on home beta blockers + dysrhythmias Atrial Fibrillation + pacemaker  Rhythm:Irregular Rate:Normal     Neuro/Psych negative neurological ROS  negative psych ROS   GI/Hepatic Neg liver ROS, GERD  Medicated,  Endo/Other  Hypothyroidism Morbid obesity  Renal/GU negative Renal ROS  negative genitourinary   Musculoskeletal  (+) Arthritis , Osteoarthritis,    Abdominal   Peds  Hematology  (+) Blood dyscrasia, anemia ,   Anesthesia Other Findings   Reproductive/Obstetrics negative OB ROS                            Anesthesia Physical Anesthesia Plan  ASA: III  Anesthesia Plan: General   Post-op Pain Management:    Induction: Intravenous  PONV Risk Score and Plan: 3 and Ondansetron, Dexamethasone and Midazolam  Airway Management Planned: Oral ETT  Additional Equipment:   Intra-op Plan:   Post-operative Plan: Extubation in OR  Informed Consent: I have reviewed the patients History and Physical, chart, labs and discussed the procedure including the risks, benefits and alternatives for the proposed anesthesia with the patient or authorized representative who has indicated his/her understanding and acceptance.     Dental advisory given  Plan Discussed with: CRNA  Anesthesia Plan  Comments:        Anesthesia Quick Evaluation

## 2020-10-02 ENCOUNTER — Encounter (HOSPITAL_COMMUNITY): Payer: Self-pay | Admitting: Cardiology

## 2020-10-02 ENCOUNTER — Ambulatory Visit (HOSPITAL_COMMUNITY)
Admission: RE | Disposition: A | Discharge status: Home / Self Care | Payer: Self-pay | Source: Home / Self Care | Attending: Cardiology

## 2020-10-02 ENCOUNTER — Other Ambulatory Visit: Payer: Self-pay

## 2020-10-02 ENCOUNTER — Ambulatory Visit (HOSPITAL_COMMUNITY)
Admission: RE | Admit: 2020-10-02 | Discharge: 2020-10-02 | Disposition: A | Discharge status: Home / Self Care | Payer: BC Managed Care – PPO | Attending: Cardiology | Admitting: Cardiology

## 2020-10-02 ENCOUNTER — Ambulatory Visit (HOSPITAL_COMMUNITY): Payer: BC Managed Care – PPO | Admitting: Anesthesiology

## 2020-10-02 DIAGNOSIS — Z950 Presence of cardiac pacemaker: Secondary | ICD-10-CM | POA: Insufficient documentation

## 2020-10-02 DIAGNOSIS — Z20822 Contact with and (suspected) exposure to covid-19: Secondary | ICD-10-CM | POA: Insufficient documentation

## 2020-10-02 DIAGNOSIS — I480 Paroxysmal atrial fibrillation: Secondary | ICD-10-CM | POA: Insufficient documentation

## 2020-10-02 DIAGNOSIS — Z7901 Long term (current) use of anticoagulants: Secondary | ICD-10-CM | POA: Insufficient documentation

## 2020-10-02 DIAGNOSIS — Z79899 Other long term (current) drug therapy: Secondary | ICD-10-CM | POA: Insufficient documentation

## 2020-10-02 DIAGNOSIS — I2510 Atherosclerotic heart disease of native coronary artery without angina pectoris: Secondary | ICD-10-CM | POA: Insufficient documentation

## 2020-10-02 DIAGNOSIS — I495 Sick sinus syndrome: Secondary | ICD-10-CM | POA: Insufficient documentation

## 2020-10-02 DIAGNOSIS — Z79890 Hormone replacement therapy (postmenopausal): Secondary | ICD-10-CM | POA: Insufficient documentation

## 2020-10-02 DIAGNOSIS — F17210 Nicotine dependence, cigarettes, uncomplicated: Secondary | ICD-10-CM | POA: Insufficient documentation

## 2020-10-02 DIAGNOSIS — I483 Typical atrial flutter: Secondary | ICD-10-CM | POA: Diagnosis not present

## 2020-10-02 DIAGNOSIS — I4892 Unspecified atrial flutter: Secondary | ICD-10-CM | POA: Diagnosis not present

## 2020-10-02 DIAGNOSIS — Z881 Allergy status to other antibiotic agents status: Secondary | ICD-10-CM | POA: Insufficient documentation

## 2020-10-02 DIAGNOSIS — I5022 Chronic systolic (congestive) heart failure: Secondary | ICD-10-CM | POA: Diagnosis not present

## 2020-10-02 DIAGNOSIS — Z882 Allergy status to sulfonamides status: Secondary | ICD-10-CM | POA: Insufficient documentation

## 2020-10-02 HISTORY — PX: ATRIAL FIBRILLATION ABLATION: EP1191

## 2020-10-02 LAB — POCT ACTIVATED CLOTTING TIME
Activated Clotting Time: 368 seconds
Activated Clotting Time: 422 seconds
Activated Clotting Time: 327 seconds
Activated Clotting Time: 422 seconds

## 2020-10-02 SURGERY — ATRIAL FIBRILLATION ABLATION
Anesthesia: General

## 2020-10-02 MED ORDER — MIDAZOLAM HCL 5 MG/5ML IJ SOLN
INTRAMUSCULAR | Status: DC | PRN
  Administered 2020-10-02: 2 mg via INTRAVENOUS

## 2020-10-02 MED ORDER — DEXAMETHASONE SODIUM PHOSPHATE 10 MG/ML IJ SOLN
INTRAMUSCULAR | Status: DC | PRN
  Administered 2020-10-02: 10 mg via INTRAVENOUS

## 2020-10-02 MED ORDER — FENTANYL CITRATE (PF) 250 MCG/5ML IJ SOLN
INTRAMUSCULAR | Status: DC | PRN
  Administered 2020-10-02: 100 ug via INTRAVENOUS

## 2020-10-02 MED ORDER — HEPARIN (PORCINE) IN NACL 1000-0.9 UT/500ML-% IV SOLN
INTRAVENOUS | Status: AC
  Filled 2020-10-02: qty 500

## 2020-10-02 MED ORDER — HEPARIN SODIUM (PORCINE) 1000 UNIT/ML IJ SOLN
INTRAMUSCULAR | Status: AC
  Filled 2020-10-02: qty 1

## 2020-10-02 MED ORDER — ONDANSETRON HCL 4 MG/2ML IJ SOLN
INTRAMUSCULAR | Status: DC | PRN
  Administered 2020-10-02: 4 mg via INTRAVENOUS

## 2020-10-02 MED ORDER — PHENYLEPHRINE HCL-NACL 10-0.9 MG/250ML-% IV SOLN
INTRAVENOUS | Status: DC | PRN
  Administered 2020-10-02: 25 ug/min via INTRAVENOUS

## 2020-10-02 MED ORDER — APIXABAN 5 MG PO TABS
5.0000 mg | ORAL_TABLET | Freq: Two times a day (BID) | ORAL | Status: AC
  Administered 2020-10-02: 5 mg via ORAL
  Filled 2020-10-02: qty 1

## 2020-10-02 MED ORDER — SODIUM CHLORIDE 0.9 % IV SOLN: 250.0000 mL | INTRAVENOUS | Status: DC | PRN

## 2020-10-02 MED ORDER — CELECOXIB 200 MG PO CAPS
200.0000 mg | ORAL_CAPSULE | Freq: Once | ORAL | Status: AC
  Administered 2020-10-02: 200 mg via ORAL
  Filled 2020-10-02: qty 1

## 2020-10-02 MED ORDER — ACETAMINOPHEN 500 MG PO TABS
1000.0000 mg | ORAL_TABLET | Freq: Once | ORAL | Status: AC
  Administered 2020-10-02: 1000 mg via ORAL
  Filled 2020-10-02 (×2): qty 2

## 2020-10-02 MED ORDER — HEPARIN SODIUM (PORCINE) 1000 UNIT/ML IJ SOLN
INTRAMUSCULAR | Status: DC | PRN
  Administered 2020-10-02: 3000 [IU] via INTRAVENOUS
  Administered 2020-10-02: 15000 [IU] via INTRAVENOUS

## 2020-10-02 MED ORDER — PROTAMINE SULFATE 10 MG/ML IV SOLN
INTRAVENOUS | Status: DC | PRN
  Administered 2020-10-02: 40 mg via INTRAVENOUS

## 2020-10-02 MED ORDER — LIDOCAINE 2% (20 MG/ML) 5 ML SYRINGE
INTRAMUSCULAR | Status: DC | PRN
  Administered 2020-10-02: 60 mg via INTRAVENOUS

## 2020-10-02 MED ORDER — PHENYLEPHRINE 40 MCG/ML (10ML) SYRINGE FOR IV PUSH (FOR BLOOD PRESSURE SUPPORT): PREFILLED_SYRINGE | INTRAVENOUS | Status: DC | PRN

## 2020-10-02 MED ORDER — ACETAMINOPHEN 325 MG PO TABS: 650.0000 mg | ORAL_TABLET | ORAL | Status: DC | PRN

## 2020-10-02 MED ORDER — DOBUTAMINE IN D5W 4-5 MG/ML-% IV SOLN
INTRAVENOUS | Status: AC
  Filled 2020-10-02: qty 250

## 2020-10-02 MED ORDER — CEFAZOLIN SODIUM-DEXTROSE 2-3 GM-%(50ML) IV SOLR
INTRAVENOUS | Status: DC | PRN
  Administered 2020-10-02: 2 g via INTRAVENOUS

## 2020-10-02 MED ORDER — HEPARIN SODIUM (PORCINE) 1000 UNIT/ML IJ SOLN
INTRAMUSCULAR | Status: DC | PRN
  Administered 2020-10-02: 1000 [IU] via INTRAVENOUS

## 2020-10-02 MED ORDER — CEFAZOLIN SODIUM-DEXTROSE 2-4 GM/100ML-% IV SOLN
INTRAVENOUS | Status: AC
  Filled 2020-10-02: qty 100

## 2020-10-02 MED ORDER — ROCURONIUM BROMIDE 10 MG/ML (PF) SYRINGE
PREFILLED_SYRINGE | INTRAVENOUS | Status: DC | PRN
  Administered 2020-10-02: 60 mg via INTRAVENOUS

## 2020-10-02 MED ORDER — HEPARIN (PORCINE) IN NACL 1000-0.9 UT/500ML-% IV SOLN
INTRAVENOUS | Status: DC | PRN
  Administered 2020-10-02 (×5): 500 mL

## 2020-10-02 MED ORDER — PROPOFOL 10 MG/ML IV BOLUS
INTRAVENOUS | Status: DC | PRN
  Administered 2020-10-02: 120 mg via INTRAVENOUS

## 2020-10-02 MED ORDER — SUGAMMADEX SODIUM 200 MG/2ML IV SOLN
INTRAVENOUS | Status: DC | PRN
  Administered 2020-10-02: 200 mg via INTRAVENOUS

## 2020-10-02 MED ORDER — ONDANSETRON HCL 4 MG/2ML IJ SOLN: 4.0000 mg | Freq: Four times a day (QID) | INTRAMUSCULAR | Status: DC | PRN

## 2020-10-02 MED ORDER — SODIUM CHLORIDE 0.9 % IV SOLN: INTRAVENOUS | Status: DC

## 2020-10-02 MED ORDER — DOBUTAMINE IN D5W 4-5 MG/ML-% IV SOLN
INTRAVENOUS | Status: DC | PRN
  Administered 2020-10-02: 20 ug/kg/min via INTRAVENOUS

## 2020-10-02 MED ORDER — HYDROMORPHONE HCL 2 MG PO TABS
4.0000 mg | ORAL_TABLET | ORAL | Status: DC | PRN
  Administered 2020-10-02: 4 mg via ORAL
  Filled 2020-10-02: qty 1
  Filled 2020-10-02: qty 2

## 2020-10-02 MED ORDER — SODIUM CHLORIDE 0.9% FLUSH: 3.0000 mL | INTRAVENOUS | Status: DC | PRN

## 2020-10-02 SURGICAL SUPPLY — 22 items
BAG SNAP BAND KOVER 36X36 (MISCELLANEOUS) ×1 IMPLANT
BLANKET WARM UNDERBOD FULL ACC (MISCELLANEOUS) ×2 IMPLANT
CATH MAPPNG PENTARAY F 2-6-2MM (CATHETERS) IMPLANT
CATH S CIRCA THERM PROBE 10F (CATHETERS) ×1 IMPLANT
CATH SMTCH THERMOCOOL SF DF (CATHETERS) ×1 IMPLANT
CATH SOUNDSTAR ECO 8FR (CATHETERS) ×1 IMPLANT
CATH WEBSTER BI DIR CS D-F CRV (CATHETERS) ×1 IMPLANT
CLOSURE PERCLOSE PROSTYLE (VASCULAR PRODUCTS) ×4 IMPLANT
COVER SWIFTLINK CONNECTOR (BAG) ×2 IMPLANT
KIT VERSACROSS STEERABLE D1 (CATHETERS) ×1 IMPLANT
MAT PREVALON FULL STRYKER (MISCELLANEOUS) ×1 IMPLANT
PACK EP LATEX FREE (CUSTOM PROCEDURE TRAY) ×2
PACK EP LF (CUSTOM PROCEDURE TRAY) ×1 IMPLANT
PAD PRO RADIOLUCENT 2001M-C (PAD) ×2 IMPLANT
PATCH CARTO3 (PAD) ×1 IMPLANT
PENTARAY F 2-6-2MM (CATHETERS) ×2
SHEATH CARTO VIZIGO SM CVD (SHEATH) ×1 IMPLANT
SHEATH PINNACLE 7F 10CM (SHEATH) ×1 IMPLANT
SHEATH PINNACLE 8F 10CM (SHEATH) ×2 IMPLANT
SHEATH PINNACLE 9F 10CM (SHEATH) ×1 IMPLANT
SHEATH PROBE COVER 6X72 (BAG) ×1 IMPLANT
TUBING SMART ABLATE COOLFLOW (TUBING) ×1 IMPLANT

## 2020-10-02 NOTE — Transfer of Care (Signed)
Immediate Anesthesia Transfer of Care Note  Patient: [name]  Procedure(s) Performed: ATRIAL FIBRILLATION ABLATION (N/A )  Patient Location: PACU  Anesthesia Type:General  Level of Consciousness: awake, alert  and oriented  Airway & Oxygen Therapy: Patient Spontanous Breathing and Patient connected to nasal cannula oxygen  Post-op Assessment: Report given to RN, Post -op Vital signs reviewed and stable and Patient moving all extremities  Post vital signs: Reviewed and stable  Last Vitals:  Vitals Value Taken Time  BP 125/52 10/02/20 1043  Temp    Pulse 63 10/02/20 1044  Resp 16 10/02/20 1044  SpO2 99 % 10/02/20 1044  Vitals shown include unvalidated device data.  Last Pain:  Vitals:   10/02/20 1036  TempSrc:   PainSc: 9       Patients Stated Pain Goal: 1 (10/02/20 1036)  Complications: No complications documented.

## 2020-10-02 NOTE — Interval H&P Note (Signed)
History and Physical Interval Note:  10/02/2020 7:03 AM  [name]  has presented today for surgery, with the diagnosis of Atriola fibrillation.  The various methods of treatment have been discussed with the patient and family. After consideration of risks, benefits and other options for treatment, the patient has consented to  Procedure(s): ATRIAL FIBRILLATION ABLATION (N/A) as a surgical intervention.  The patient's history has been reviewed, patient examined, no change in status, stable for surgery.  I have reviewed the patient's chart and labs.  Questions were answered to the patient's satisfaction.     Peytan Andringa [employer]

## 2020-10-02 NOTE — Anesthesia Procedure Notes (Signed)
Procedure Name: Intubation Date/Time: 10/02/2020 7:43 AM Performed by: Kyung Rudd, CRNA Pre-anesthesia Checklist: Patient identified, Emergency Drugs available, Suction available and Patient being monitored Patient Re-evaluated:Patient Re-evaluated prior to induction Oxygen Delivery Method: Circle system utilized Preoxygenation: Pre-oxygenation with 100% oxygen Induction Type: IV induction Ventilation: Mask ventilation without difficulty Laryngoscope Size: Mac and 3 Grade View: Grade I Tube type: Oral Tube size: 7.0 mm Number of attempts: 1 Airway Equipment and Method: Stylet Placement Confirmation: ETT inserted through vocal cords under direct vision,  positive ETCO2 and breath sounds checked- equal and bilateral Secured at: 21 cm Tube secured with: Tape Dental Injury: Teeth and Oropharynx as per pre-operative assessment

## 2020-10-02 NOTE — Progress Notes (Signed)
Patient was given discharge instructions. She verbalized understanding. 

## 2020-10-02 NOTE — Progress Notes (Addendum)
Red rash noted to left skin fold near groin area and abd skin fold. Janne Napoleon, Rn informed

## 2020-10-02 NOTE — Discharge Instructions (Signed)

## 2020-10-02 NOTE — Anesthesia Postprocedure Evaluation (Signed)
Anesthesia Post Note  Patient: [name]  Procedure(s) Performed: ATRIAL FIBRILLATION ABLATION (N/A )     Patient location during evaluation: Cath Lab Anesthesia Type: General Level of consciousness: awake and alert Pain management: pain level controlled Vital Signs Assessment: post-procedure vital signs reviewed and stable Respiratory status: spontaneous breathing, nonlabored ventilation and respiratory function stable Cardiovascular status: blood pressure returned to baseline and stable Postop Assessment: no apparent nausea or vomiting Anesthetic complications: no   No complications documented.  Last Vitals:  Vitals:   10/02/20 1110 10/02/20 1115  BP: (!) 104/51 (!) 118/46  Pulse: 60 63  Resp: 16 14  Temp:    SpO2: 97% 95%    Last Pain:  Vitals:   10/02/20 1036  TempSrc:   PainSc: 9                  Cache Decoursey,W. EDMOND

## 2020-10-03 ENCOUNTER — Encounter (HOSPITAL_COMMUNITY): Payer: Self-pay | Admitting: Cardiology

## 2020-10-03 MED FILL — Cefazolin Sodium-Dextrose IV Solution 2 GM/100ML-4%: INTRAVENOUS | Qty: 100 | Status: AC

## 2020-10-15 MED ORDER — MULTAQ 400 MG PO TABS: ORAL_TABLET | ORAL | 3 refills | Status: DC

## 2020-10-20 ENCOUNTER — Other Ambulatory Visit: Payer: Self-pay | Admitting: Cardiovascular Disease

## 2020-10-26 ENCOUNTER — Other Ambulatory Visit: Payer: Self-pay | Admitting: Cardiology

## 2020-11-01 ENCOUNTER — Other Ambulatory Visit: Payer: Self-pay

## 2020-11-01 ENCOUNTER — Ambulatory Visit (HOSPITAL_COMMUNITY)
Admission: RE | Admit: 2020-11-01 | Discharge: 2020-11-01 | Disposition: A | Discharge status: Home / Self Care | Payer: Self-pay | Source: Ambulatory Visit | Attending: Nurse Practitioner | Admitting: Nurse Practitioner

## 2020-11-01 VITALS — BP 106/56 | HR 62 | Ht 69.0 in | Wt 254.2 lb

## 2020-11-01 DIAGNOSIS — I480 Paroxysmal atrial fibrillation: Secondary | ICD-10-CM | POA: Insufficient documentation

## 2020-11-01 DIAGNOSIS — F17210 Nicotine dependence, cigarettes, uncomplicated: Secondary | ICD-10-CM | POA: Insufficient documentation

## 2020-11-01 DIAGNOSIS — Z7901 Long term (current) use of anticoagulants: Secondary | ICD-10-CM | POA: Insufficient documentation

## 2020-11-01 DIAGNOSIS — Z79899 Other long term (current) drug therapy: Secondary | ICD-10-CM | POA: Insufficient documentation

## 2020-11-01 DIAGNOSIS — Z881 Allergy status to other antibiotic agents status: Secondary | ICD-10-CM | POA: Insufficient documentation

## 2020-11-01 DIAGNOSIS — D6869 Other thrombophilia: Secondary | ICD-10-CM

## 2020-11-01 NOTE — Progress Notes (Signed)
Primary Care Physician: Aletha Halim., PA-C Referring Physician: Dr. Susy Manor is a 60 y.o. female with a h/o afib, s/p PPM, that has been noted to have several episodes of afib per the device clinic. For the majority of these episodes, she  is not symptomatic. She is on Multaq 400 mg bid and her metoprolol was recently increased to 50 mg bid. Since then she has not received any further calls notifying her of afib. For now she is happy with her current management and does not want to change the course.   F/u in afib clinic 11/01/20. Pt had an afib ablation 10/02/20 and is here for f/u. She reports that she is doing well without any afib except for a 30 min episode 1 week ago. No swallowing or groin issues.  She is A sensed V paced today.   Today, she denies symptoms of palpitations, chest pain, shortness of breath, orthopnea, PND, lower extremity edema, dizziness, presyncope, syncope, or neurologic sequela. The patient is tolerating medications without difficulties and is otherwise without complaint today.   Past Medical History:  Diagnosis Date  . Alcohol abuse    a. quit several years ago.  . Allergy   . Anemia    younger  . Arthritis    feet,knees  . B12 deficiency   . BPPV (benign paroxysmal positional vertigo)   . Cirrhosis ([place])   . Complication of anesthesia    "slow to wake"  . Constipation    on movantik- stools regular and soft on this  . COPD (chronic obstructive pulmonary disease) ([place])   . Foot drop   . GERD (gastroesophageal reflux disease)   . Hyperlipidemia   . Hypothyroidism   . Neuromuscular disorder (HCC)    idiopathic neuropathy  . 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 Spinal cord stimulator status   . Spinal stenosis   . Tobacco abuse    Past Surgical History:  Procedure Laterality Date  . ANKLE SURGERY    . ATRIAL FIBRILLATION ABLATION N/A 10/02/2020   Procedure:  ATRIAL FIBRILLATION ABLATION;  Surgeon: Constance Haw, MD;  Location: [facility];  Service: Cardiovascular;  Laterality: N/A;  . CERVICAL CONE BIOPSY    . CESAREAN SECTION    . COLONOSCOPY    . HERNIA REPAIR    . KNEE SURGERY     x5  . ORIF ANKLE FRACTURE Left 06/02/2018   Procedure: OPEN REDUCTION INTERNAL FIXATION (ORIF) ANKLE FRACTURE;  Surgeon: Wylene Simmer, MD;  Location: [facility];  Service: Orthopedics;  Laterality: Left;  . ORIF ANKLE FRACTURE Left 08/26/2018   Procedure: Left ankle syndesmosis OPEN REDUCTION INTERNAL FIXATION (ORIF)/ reconstruction of deltoid ligament;  Surgeon: Wylene Simmer, MD;  Location: [place] [facility];  Service: Orthopedics;  Laterality: Left;  . PACEMAKER IMPLANT N/A 08/05/2018   Procedure: PACEMAKER IMPLANT;  Surgeon: Constance Haw, MD;  Location: [facility];  Service: Cardiovascular;  Laterality: N/A;  . POLYPECTOMY    . SPINAL CORD STIMULATOR INSERTION N/A 05/07/2016   Procedure: LUMBAR SPINAL CORD STIMULATOR INSERTION;  Surgeon: Melina Schools, MD;  Location: [facility];  Service: Orthopedics;  Laterality: N/A;    Current Outpatient Medications  Medication Sig Dispense Refill  . albuterol (PROVENTIL HFA;VENTOLIN HFA) 108 (90 Base) MCG/ACT inhaler Inhale 2 puffs into the lungs every 6 (six) hours as needed for wheezing or shortness of breath.     Marland Kitchen  apixaban (ELIQUIS) 5 MG TABS tablet Take 1 tablet (5 mg total) by mouth 2 (two) times daily. 180 tablet 1  . B Complex Vitamins (B COMPLEX PO) Take 1 tablet by mouth daily.    Marland Kitchen BREO ELLIPTA 200-25 MCG/INH AEPB Inhale 1 puff into the lungs daily.  11  . Coenzyme Q10 (CO Q 10) 100 MG CAPS Take 100 mg by mouth at bedtime.    . dronedarone (MULTAQ) 400 MG tablet TAKE 1 TABLET (400 MG TOTAL) BY MOUTH 2 (TWO) TIMES DAILY WITH A MEAL. 180 tablet 3  . famotidine (PEPCID) 20 MG tablet Take 20 mg by mouth 2 (two) times daily. New Zantac 160F  0  . folic acid (FOLVITE) 1 MG tablet Take 1 mg  by mouth daily.     . hydrochlorothiazide (HYDRODIURIL) 25 MG tablet Take 25 mg by mouth daily.  1  . HYDROmorphone (DILAUDID) 4 MG tablet Take 4 mg by mouth 5 (five) times daily as needed for severe pain.    Marland Kitchen levothyroxine (SYNTHROID) 25 MCG tablet Take 25 mcg by mouth daily before breakfast.     . lisinopril (ZESTRIL) 2.5 MG tablet TAKE 1 TABLET BY MOUTH EVERY DAY 90 tablet 3  . magnesium gluconate (MAGONATE) 500 MG tablet Take 500 mg by mouth at bedtime.    . metaxalone (SKELAXIN) 800 MG tablet Take 800 mg by mouth 3 (three) times daily.    . metoprolol succinate (TOPROL-XL) 100 MG 24 hr tablet TAKE 1 TABLET (100 MG TOTAL) BY MOUTH DAILY. TAKE WITH OR IMMEDIATELY FOLLOWING A MEAL. 90 tablet 3  . Olopatadine HCl (PATADAY) 0.7 % SOLN Place 1 drop into both eyes daily.    . pregabalin (LYRICA) 150 MG capsule Take 150 mg by mouth 2 (two) times daily. Additional 150 mg in the middle of the day if needed    . RESTASIS MULTIDOSE 0.05 % ophthalmic emulsion Place 1 drop into both eyes every evening.    . Rosuvastatin Calcium 20 MG CPSP Take 20 mg by mouth daily.    Marland Kitchen senna (SENOKOT) 8.6 MG TABS tablet Take 2 tablets by mouth at bedtime.    Marland Kitchen tiZANidine (ZANAFLEX) 4 MG tablet Take 8 mg by mouth at bedtime.    . Vitamin D, Ergocalciferol, (DRISDOL) 50000 units CAPS capsule Take 50,000 Units by mouth every Wednesday.     . fluticasone (FLONASE) 50 MCG/ACT nasal spray Place 2 sprays into both nostrils daily as needed for allergies (seasonal allergies).  (Patient not taking: Reported on 11/01/2020)  11   No current facility-administered medications for this encounter.    Allergies  Allergen Reactions  . Short Ragweed Pollen Ext Cough and Other (See Comments)    Other reaction(s): Eye Redness  . Sulfamethoxazole Hives    GI Upset  . Vancomycin Hives    Social History   Socioeconomic History  . Marital status: Married    Spouse name: Not on file  . Number of children: Not on file  . Years of  education: Not on file  . Highest education level: Not on file  Occupational History  . Not on file  Tobacco Use  . Smoking status: Current Every Day Smoker    Packs/day: 1.00    Years: 30.00    Pack years: 30.00    Types: Cigarettes  . Smokeless tobacco: Never Used  Vaping Use  . Vaping Use: Never used  Substance and Sexual Activity  . Alcohol use: Not Currently    Comment: Prior  alcohol abuse - quit several years ago.  . Drug use: No  . Sexual activity: Yes    Birth control/protection: Post-menopausal  Other Topics Concern  . Not on file  Social History Narrative  . Not on file   Social Determinants of Health   Financial Resource Strain: Not on file  Food Insecurity: Not on file  Transportation Needs: Not on file  Physical Activity: Not on file  Stress: Not on file  Social Connections: Not on file  Intimate Partner Violence: Not on file    Family History  Adopted: Yes  Family history unknown: Yes    ROS- All systems are reviewed and negative except as per the HPI above  Physical Exam: Vitals:   11/01/20 1539  BP: (!) 106/56  Pulse: 62  Weight: 115.3 kg  Height: 5\' 9"  (1.753 m)   Wt Readings from Last 3 Encounters:  11/01/20 115.3 kg  10/02/20 113.4 kg  09/18/20 112.9 kg    Labs: Lab Results  Component Value Date   NA 143 09/18/2020   K 3.9 09/18/2020   CL 101 09/18/2020   CO2 22 09/18/2020   GLUCOSE 144 (H) 09/18/2020   BUN 12 09/18/2020   CREATININE 0.86 09/18/2020   CALCIUM 9.7 09/18/2020   PHOS 1.6 (L) 06/03/2018   MG 1.9 06/03/2018   Lab Results  Component Value Date   INR 1.1 ratio (H) 03/13/2010   Lab Results  Component Value Date   CHOL 119 06/02/2018   HDL 39 (L) 06/02/2018   LDLCALC 59 06/02/2018   TRIG 104 06/02/2018     GEN- The patient is well appearing, alert and oriented x 3 today.   Head- normocephalic, atraumatic Eyes-  Sclera clear, conjunctiva pink Ears- hearing intact Oropharynx- clear Neck- supple, no  JVP Lymph- no cervical lymphadenopathy Lungs- Clear to ausculation bilaterally, normal work of breathing Heart-  regular rate and rhythm, no murmurs, rubs or gallops, PMI not laterally displaced GI- soft, NT, ND, + BS Extremities- no clubbing, cyanosis, or edema MS- no significant deformity or atrophy Skin- no rash or lesion Psych- euthymic mood, full affect Neuro- strength and sensation are intact  EKG-atrial paced at 612 bpm, pr int 212 ms, qrs int 152 ms, qtc 507 ms  Device noted reviewed    Assessment and Plan: 1. Paroxysmal  afib S/p ablation one month ago She is in SR She will continue with Multaq 400 mg bid and metoprolol 100 mg daily   2. CHA2DS2VASc score of at least 2 Continue  eliquis 5 mg bid without any interruption   F/u with Dr. Curt Bears  12/2820  Geroge Baseman. Dawnisha Marquina, [facility] [address] [place], [place] [address] [phone]

## 2020-11-05 ENCOUNTER — Telehealth: Payer: Self-pay | Admitting: Pharmacist

## 2020-11-05 NOTE — Telephone Encounter (Signed)
Called pt and left message advising her we are still waiting to hear back from her insurance regarding Eliquis appeals. In the mean time if she needs a refill, her pharmacy should be able to process her copay card to continue with $10 copay.

## 2020-11-12 ENCOUNTER — Ambulatory Visit (INDEPENDENT_AMBULATORY_CARE_PROVIDER_SITE_OTHER): Payer: BC Managed Care – PPO

## 2020-11-12 DIAGNOSIS — I495 Sick sinus syndrome: Secondary | ICD-10-CM

## 2020-11-14 LAB — CUP PACEART REMOTE DEVICE CHECK
Battery Remaining Longevity: 131 mo
Battery Remaining Percentage: 95.5 %
Battery Voltage: 3.01 V
Brady Statistic AP VP Percent: 1 %
Brady Statistic AP VS Percent: 79 %
Brady Statistic AS VP Percent: 1 %
Brady Statistic AS VS Percent: 21 %
Brady Statistic RA Percent Paced: 78 %
Brady Statistic RV Percent Paced: 1.6 %
Date Time Interrogation Session: 20220207020014
Implantable Lead Implant Date: 20191031
Implantable Lead Implant Date: 20191031
Implantable Lead Location: 753859
Implantable Lead Location: 753860
Implantable Pulse Generator Implant Date: 20191031
Lead Channel Impedance Value: 510 Ohm
Lead Channel Impedance Value: 600 Ohm
Lead Channel Pacing Threshold Amplitude: 0.5 V
Lead Channel Pacing Threshold Amplitude: 0.5 V
Lead Channel Pacing Threshold Pulse Width: 0.5 ms
Lead Channel Pacing Threshold Pulse Width: 0.5 ms
Lead Channel Sensing Intrinsic Amplitude: 12 mV
Lead Channel Sensing Intrinsic Amplitude: 3.6 mV
Lead Channel Setting Pacing Amplitude: 0.75 V
Lead Channel Setting Pacing Amplitude: 1.5 V
Lead Channel Setting Pacing Pulse Width: 0.5 ms
Lead Channel Setting Sensing Sensitivity: 2 mV
Pulse Gen Model: 2272
Pulse Gen Serial Number: 9079034

## 2020-11-16 ENCOUNTER — Encounter: Payer: Self-pay | Admitting: Pharmacist

## 2020-11-16 NOTE — Progress Notes (Signed)
Remote pacemaker transmission.   

## 2020-11-16 NOTE — Telephone Encounter (Signed)
This encounter was created in error - please disregard.

## 2020-11-19 ENCOUNTER — Telehealth: Payer: Self-pay | Admitting: Pharmacist

## 2020-11-19 NOTE — Telephone Encounter (Signed)
Called pt to advise her level 1 appeals for Eliquis was denied. Level 2 appeals submitted this AM. Left message for pt advising her of this. Her copay card for Eliquis should continue to work while insurance drags their feet on Eliquis coverage.

## 2020-11-20 ENCOUNTER — Other Ambulatory Visit: Payer: Self-pay | Admitting: Cardiology

## 2020-11-21 NOTE — Telephone Encounter (Signed)
Prescription refill request for Eliquis received.  Indication: Afib Last office visit: 1/27/2022Kayleen Memos, 09/18/2020, [place] Scr: 0.86, 09/18/2020 Age: 60 yo  Weight: 115.3 kg   Prescription refill sent.

## 2020-11-23 NOTE — Telephone Encounter (Signed)
Eliquis is now back on formulary for state health plan due to overwhelming # of prescriber requests. Pt is aware.

## 2020-12-31 ENCOUNTER — Ambulatory Visit: Payer: BC Managed Care – PPO | Admitting: Cardiology

## 2020-12-31 ENCOUNTER — Encounter: Payer: Self-pay | Admitting: Cardiology

## 2020-12-31 ENCOUNTER — Other Ambulatory Visit: Payer: Self-pay

## 2020-12-31 VITALS — BP 116/54 | HR 68 | Ht 69.0 in | Wt 252.0 lb

## 2020-12-31 DIAGNOSIS — I4819 Other persistent atrial fibrillation: Secondary | ICD-10-CM

## 2020-12-31 MED ORDER — METOPROLOL SUCCINATE ER 50 MG PO TB24: 50.0000 mg | ORAL_TABLET | Freq: Every day | ORAL | 3 refills | Status: DC

## 2020-12-31 NOTE — Progress Notes (Signed)
Electrophysiology Office Note   Date:  12/31/2020   ID:  DUSTEE BOTTENFIELD, DOB 03-21-1961, MRN [id number]  PCP:  Aletha Halim., PA-C  Cardiologist:  Johnsie Cancel Primary Electrophysiologist:  Bion Todorov Meredith Leeds, MD    No chief complaint on file.    History of Present Illness: [name] is a 60 y.o. 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 Presenting today for electrophysiology evaluation.    She has a history significant for alcohol abuse, cirrhosis, B12 deficiency, aortic atherosclerosis, coronary artery disease by CT scan, COPD, hypothyroidism, hyperlipidemia.  She presented to the hospital August 2019 with a brown out episode.  The morning of the event she woke up at 4 AM to go to work.  She felt more tired than usual.  She collapsed after standing in a long line.  In the emergency room she was found to have heart rates in the 40s as well as atrial fibrillation in the 120s.  She is status post [place] dual-chamber pacemaker implanted 07/28/2018.  For her atrial fibrillation she is now status post ablation 10/02/2020.  Today, denies symptoms of palpitations, chest pain, shortness of breath, orthopnea, PND, lower extremity edema, claudication, dizziness, presyncope, syncope, bleeding, or neurologic sequela. The patient is tolerating medications without difficulties.  Overall she is feeling well.  She says that she has not noted any further episodes of atrial fibrillation.  She does state that there a few times a day her heart rate Georgine Wiltse start to be fast but this is all short-lived.  She is overall comfortable with her control.  Past Medical History:  Diagnosis Date   Alcohol abuse    a. quit several years ago.   Allergy    Anemia    younger   Arthritis    feet,knees   B12 deficiency    BPPV (benign paroxysmal positional vertigo)    Cirrhosis (HCC)    Complication of anesthesia    "slow to  wake"   Constipation    on movantik- stools regular and soft on this   COPD (chronic obstructive pulmonary disease) (HCC)    Foot drop    GERD (gastroesophageal reflux disease)    Hyperlipidemia    Hypothyroidism    Neuromuscular disorder (HCC)    idiopathic neuropathy   Presence of permanent cardiac pacemaker 08/05/2018   pacemaker insertion for tachy/brady syndrome   Spinal cord stimulator status    Spinal stenosis    Tobacco abuse    Past Surgical History:  Procedure Laterality Date   ANKLE SURGERY     ATRIAL FIBRILLATION ABLATION N/A 10/02/2020   Procedure: ATRIAL FIBRILLATION ABLATION;  Surgeon: Constance Haw, MD;  Location: [place] [facility];  Service: Cardiovascular;  Laterality: N/A;   CERVICAL CONE BIOPSY     CESAREAN SECTION     COLONOSCOPY     HERNIA REPAIR     KNEE SURGERY     x5   ORIF ANKLE FRACTURE Left 06/02/2018   Procedure: OPEN REDUCTION INTERNAL FIXATION (ORIF) ANKLE FRACTURE;  Surgeon: Wylene Simmer, MD;  [facility];  Service: Orthopedics;  Laterality: Left;   ORIF ANKLE FRACTURE Left 08/26/2018   Procedure: Left ankle syndesmosis OPEN REDUCTION INTERNAL FIXATION (ORIF)/ reconstruction of deltoid ligament;  Surgeon: Wylene Simmer, MD;  Location: [place];  Service: Orthopedics;  Laterality: Left;   PACEMAKER IMPLANT N/A 08/05/2018   Procedure: PACEMAKER IMPLANT;  Surgeon: Curt Bears,  Ocie Doyne, MD;  Location: [place] [facility];  Service: Cardiovascular;  Laterality: N/A;   POLYPECTOMY     SPINAL CORD STIMULATOR INSERTION N/A 05/07/2016   Procedure: LUMBAR SPINAL CORD STIMULATOR INSERTION;  Surgeon: Melina Schools, MD;  Location: [place];  Service: Orthopedics;  Laterality: N/A;     Current Outpatient Medications  Medication Sig Dispense Refill   albuterol (PROVENTIL HFA;VENTOLIN HFA) 108 (90 Base) MCG/ACT inhaler Inhale 2 puffs into the lungs every 6 (six) hours as needed for wheezing or shortness of breath.       B Complex Vitamins (B COMPLEX PO) Take 1 tablet by mouth daily.     BREO ELLIPTA 200-25 MCG/INH AEPB Inhale 1 puff into the lungs daily.  11   Coenzyme Q10 (CO Q 10) 100 MG CAPS Take 100 mg by mouth at bedtime.     dronedarone (MULTAQ) 400 MG tablet TAKE 1 TABLET (400 MG TOTAL) BY MOUTH 2 (TWO) TIMES DAILY WITH A MEAL. 180 tablet 3   ELIQUIS 5 MG TABS tablet TAKE 1 TABLET BY MOUTH TWICE A DAY 180 tablet 1   famotidine (PEPCID) 20 MG tablet Take 20 mg by mouth 2 (two) times daily. New Zantac 834  0   folic acid (FOLVITE) 1 MG tablet Take 1 mg by mouth daily.      hydrochlorothiazide (HYDRODIURIL) 25 MG tablet Take 25 mg by mouth daily.  1   HYDROmorphone (DILAUDID) 4 MG tablet Take 4 mg by mouth 5 (five) times daily as needed for severe pain.     levothyroxine (SYNTHROID) 25 MCG tablet Take 25 mcg by mouth daily before breakfast.      lisinopril (ZESTRIL) 2.5 MG tablet TAKE 1 TABLET BY MOUTH EVERY DAY 90 tablet 3   magnesium gluconate (MAGONATE) 500 MG tablet Take 500 mg by mouth at bedtime.     metaxalone (SKELAXIN) 800 MG tablet Take 800 mg by mouth 3 (three) times daily.     metoprolol succinate (TOPROL-XL) 100 MG 24 hr tablet TAKE 1 TABLET (100 MG TOTAL) BY MOUTH DAILY. TAKE WITH OR IMMEDIATELY FOLLOWING A MEAL. 90 tablet 3   Olopatadine HCl (PATADAY) 0.7 % SOLN Place 1 drop into both eyes daily.     pregabalin (LYRICA) 150 MG capsule Take 150 mg by mouth 2 (two) times daily. Additional 150 mg in the middle of the day if needed     RESTASIS MULTIDOSE 0.05 % ophthalmic emulsion Place 1 drop into both eyes every evening.     Rosuvastatin Calcium 20 MG CPSP Take 20 mg by mouth daily.     senna (SENOKOT) 8.6 MG TABS tablet Take 2 tablets by mouth at bedtime.     tiZANidine (ZANAFLEX) 4 MG tablet Take 8 mg by mouth at bedtime.     Vitamin D, Ergocalciferol, (DRISDOL) 50000 units CAPS capsule Take 50,000 Units by mouth every Wednesday.      No current  facility-administered medications for this visit.    Allergies:   Short ragweed pollen ext, Sulfamethoxazole, and Vancomycin   Social History:  The patient  reports that she has been smoking cigarettes. She has a 30.00 pack-year smoking history. She has never used smokeless tobacco. She reports previous alcohol use. She reports that she does not use drugs.   Family History:  The patient's She was adopted. Family history is unknown by patient.   ROS:  Please see the history of present illness.   Otherwise, review of systems is positive for none.  All other systems are reviewed and negative.   PHYSICAL EXAM: VS:  BP (!) 116/54    Pulse 68    Ht 5\' 9"  (1.753 m)    Wt 252 lb (114.3 kg)    LMP  (LMP Unknown)    BMI 37.21 kg/m  , BMI Body mass index is 37.21 kg/m. GEN: Well nourished, well developed, in no acute distress  HEENT: normal  Neck: no JVD, carotid bruits, or masses Cardiac: RRR; no murmurs, rubs, or gallops,no edema  Respiratory:  clear to auscultation bilaterally, normal work of breathing GI: soft, nontender, nondistended, + BS MS: no deformity or atrophy  Skin: warm and dry, device site well healed Neuro:  Strength and sensation are intact Psych: euthymic mood, full affect  EKG:  EKG is ordered today. Personal review of the ekg ordered shows sinus rhythm, rate 68, left bundle branch block  Personal review of the device interrogation today. Results in [facility]: 03/18/2020: ALT 37 09/18/2020: BUN 12; Creatinine, Ser 0.86; Hemoglobin 15.0; Platelets 166; Potassium 3.9; Sodium 143    Lipid Panel     Component Value Date/Time   CHOL 119 06/02/2018 0325   TRIG 104 06/02/2018 0325   HDL 39 (L) 06/02/2018 0325   CHOLHDL 3.1 06/02/2018 0325   VLDL 21 06/02/2018 0325   LDLCALC 59 06/02/2018 0325     Wt Readings from Last 3 Encounters:  12/31/20 252 lb (114.3 kg)  11/01/20 254 lb 3.2 oz (115.3 kg)  10/02/20 250 lb (113.4 kg)      Other studies  Reviewed: Additional studies/ records that were reviewed today include: TTE 06/01/18  Review of the above records today demonstrates:  - Left ventricle: Mild diffuse hypo kinesis with abnormal septal   motion likely from LBBB. The cavity size was mildly dilated. Wall   thickness was normal. Systolic function was mildly reduced. The   estimated ejection fraction was in the range of 45% to 50%. Left   ventricular diastolic function parameters were normal. - Mitral valve: Calcified annulus. Mildly thickened leaflets . - Left atrium: The atrium was moderately dilated. - Right ventricle: The cavity size was mildly dilated. - Right atrium: The atrium was moderately dilated. - Atrial septum: No defect or patent foramen ovale was identified.  Myoview 07/29/18  The left ventricular ejection fraction is moderately decreased (30-44%).  Nuclear stress EF is calculated at 43% but appears better visually. There is paradoxical septal motion due to LBBB  There was no ST segment deviation noted during stress.  There is a medium defect of moderate severity present in the mid anteroseptal, apical anterior, apical septal and apex location. The defect is non-reversible and likely related to breast attenuation artifact. No ischemia noted.  This is a low risk study.  30 day monitor 07/15/18 - personally reviewed NSR PAF rapid rates 160 Conversion pauses greater than 3 seconds  ASSESSMENT AND PLAN:  1.  Paroxysmal atrial fibrillation/atrial flutter: Currently on Eliquis and Multaq.  CHA2DS2-VASc of 2.  High risk medication monitoring.  Status post AF ablation 10/02/2020.  She is remained in sinus rhythm.  She has had some atrial tachycardia.  Due to that, we Lusia Greis stop her Multaq and increase her Toprol-XL to 150 mg daily.  2.  Chronic systolic heart failure: Ejection fraction 45 to 50%.  No obvious volume overload.  No changes.    3.  Coronary atherosclerosis: Low risk Myoview.  No changes.  4.   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  Device functioning appropriately.  No changes.    Current medicines are reviewed at length with the patient today.   The patient does not have concerns regarding her medicines.  The following changes were made today: Stop Multaq, increase Toprol-XL  Labs/ tests ordered today include:  Orders Placed This Encounter  Procedures   EKG 12-Lead    Disposition:   FU with Elvena Oyer 3 months  Signed, Nautia Lem Meredith Leeds, MD  12/31/2020 4:10 PM     [place] [address] [phone] (office) [phone] (fax)

## 2020-12-31 NOTE — Patient Instructions (Addendum)
Medication Instructions:  Your physician has recommended you make the following change in your medication:  1. STOP Multaq 2. INCREASE Toprol to 150 mg daily  *If you need a refill on your cardiac medications before your next appointment, please call your pharmacy*   Lab Work: None ordered  Testing/Procedures: None ordered   Follow-Up: At [facility], you and your health needs are our priority.  As part of our continuing mission to provide you with exceptional heart care, we have created designated Provider Care Teams.  These Care Teams include your primary Cardiologist (physician) and Advanced Practice Providers (APPs -  Physician Assistants and Nurse Practitioners) who all work together to provide you with the care you need, when you need it.  Remote monitoring is used to monitor your Pacemaker or ICD from home. This monitoring reduces the number of office visits required to check your device to one time per year. It allows [place] to keep an eye on the functioning of your device to ensure it is working properly. You are scheduled for a device check from home on 02/11/2021. You may send your transmission at any time that day. If you have a wireless device, the transmission will be sent automatically. After your physician reviews your transmission, you will receive a postcard with your next transmission date.  Your next appointment:   3 month(s)  The format for your next appointment:   In Person  Provider:   Allegra Lai, MD   Thank you for choosing [facility]!!   Trinidad Curet, RN [phone]

## 2020-12-31 NOTE — Addendum Note (Signed)
Addended by: Stanton Kidney on: 12/31/2020 04:16 PM   Modules accepted: Orders

## 2021-02-11 ENCOUNTER — Ambulatory Visit (INDEPENDENT_AMBULATORY_CARE_PROVIDER_SITE_OTHER): Payer: BC Managed Care – PPO

## 2021-02-11 DIAGNOSIS — I495 Sick sinus syndrome: Secondary | ICD-10-CM | POA: Diagnosis not present

## 2021-02-11 LAB — CUP PACEART REMOTE DEVICE CHECK
Battery Remaining Longevity: 133 mo
Battery Remaining Percentage: 95.5 %
Battery Voltage: 3.01 V
Brady Statistic AP VP Percent: 1 %
Brady Statistic AP VS Percent: 70 %
Brady Statistic AS VP Percent: 1 %
Brady Statistic AS VS Percent: 29 %
Brady Statistic RA Percent Paced: 70 %
Brady Statistic RV Percent Paced: 1 %
Date Time Interrogation Session: 20220509020017
Implantable Lead Implant Date: 20191031
Implantable Lead Implant Date: 20191031
Implantable Lead Location: 753859
Implantable Lead Location: 753860
Implantable Pulse Generator Implant Date: 20191031
Lead Channel Impedance Value: 530 Ohm
Lead Channel Impedance Value: 610 Ohm
Lead Channel Pacing Threshold Amplitude: 0.5 V
Lead Channel Pacing Threshold Amplitude: 0.5 V
Lead Channel Pacing Threshold Pulse Width: 0.5 ms
Lead Channel Pacing Threshold Pulse Width: 0.5 ms
Lead Channel Sensing Intrinsic Amplitude: 12 mV
Lead Channel Sensing Intrinsic Amplitude: 5 mV
Lead Channel Setting Pacing Amplitude: 0.75 V
Lead Channel Setting Pacing Amplitude: 1.5 V
Lead Channel Setting Pacing Pulse Width: 0.5 ms
Lead Channel Setting Sensing Sensitivity: 2 mV
Pulse Gen Model: 2272
Pulse Gen Serial Number: 9079034

## 2021-03-01 NOTE — Progress Notes (Signed)
Remote pacemaker transmission.   

## 2021-03-05 ENCOUNTER — Other Ambulatory Visit: Payer: Self-pay | Admitting: Cardiology

## 2021-03-05 MED ORDER — METOPROLOL SUCCINATE ER 50 MG PO TB24: 50.0000 mg | ORAL_TABLET | Freq: Every day | ORAL | 3 refills | Status: DC

## 2021-03-05 MED ORDER — METOPROLOL SUCCINATE ER 100 MG PO TB24: 100.0000 mg | ORAL_TABLET | Freq: Every day | ORAL | 3 refills | Status: DC

## 2021-03-05 NOTE — Telephone Encounter (Signed)
Outpatient Medication Detail   Disp Refills Start End   metoprolol succinate (TOPROL-XL) 50 MG 24 hr tablet 90 tablet 3 03/05/2021    Sig - Route: Take 1 tablet (50 mg total) by mouth daily. Take with your 100 mg tablet to total 150 mg daily. Take with or immediately following a meal. - Oral   Sent to pharmacy as: metoprolol succinate (TOPROL-XL) 50 MG 24 hr tablet   E-Prescribing Status: Receipt confirmed by pharmacy (03/05/2021  9:17 AM EDT)     Pharmacy  [id number] - [place], [place]

## 2021-03-18 ENCOUNTER — Ambulatory Visit (INDEPENDENT_AMBULATORY_CARE_PROVIDER_SITE_OTHER): Payer: BC Managed Care – PPO | Admitting: Cardiology

## 2021-03-18 ENCOUNTER — Encounter: Payer: Self-pay | Admitting: Cardiology

## 2021-03-18 ENCOUNTER — Other Ambulatory Visit: Payer: Self-pay

## 2021-03-18 VITALS — BP 124/66 | HR 92 | Ht 69.0 in | Wt 259.0 lb

## 2021-03-18 DIAGNOSIS — I480 Paroxysmal atrial fibrillation: Secondary | ICD-10-CM

## 2021-03-18 DIAGNOSIS — I495 Sick sinus syndrome: Secondary | ICD-10-CM | POA: Diagnosis not present

## 2021-03-18 LAB — CUP PACEART INCLINIC DEVICE CHECK
Battery Remaining Longevity: 134 mo
Battery Voltage: 3.01 V
Brady Statistic RA Percent Paced: 60 %
Brady Statistic RV Percent Paced: 0.34 %
Date Time Interrogation Session: 20220613111648
Implantable Lead Implant Date: 20191031
Implantable Lead Implant Date: 20191031
Implantable Lead Location: 753859
Implantable Lead Location: 753860
Implantable Pulse Generator Implant Date: 20191031
Lead Channel Impedance Value: 537.5 Ohm
Lead Channel Impedance Value: 625 Ohm
Lead Channel Pacing Threshold Amplitude: 0.5 V
Lead Channel Pacing Threshold Amplitude: 0.5 V
Lead Channel Pacing Threshold Pulse Width: 0.5 ms
Lead Channel Pacing Threshold Pulse Width: 0.5 ms
Lead Channel Sensing Intrinsic Amplitude: 12 mV
Lead Channel Sensing Intrinsic Amplitude: 5 mV
Lead Channel Setting Pacing Amplitude: 0.75 V
Lead Channel Setting Pacing Amplitude: 1.5 V
Lead Channel Setting Pacing Pulse Width: 0.5 ms
Lead Channel Setting Sensing Sensitivity: 2 mV
Pulse Gen Model: 2272
Pulse Gen Serial Number: 9079034

## 2021-03-18 NOTE — Progress Notes (Signed)
Electrophysiology Office Note   Date:  03/18/2021   ID:  [name], DOB 08-Dec-1960, MRN [id number]  PCP:  Aletha Halim., PA-C  Cardiologist:  Johnsie Cancel Primary Electrophysiologist:  Parley Pidcock Meredith Leeds, MD    No chief complaint on file.    History of Present Illness: [name] is a 60 y.o. 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 Presenting today for electrophysiology evaluation.    She has a history significant for alcohol abuse, cirrhosis, B12 deficiency, aortic atherosclerosis, coronary artery disease by CT, COPD, hypothyroidism, hyperlipidemia.  She presented to the hospital August 2019 with a brown out episode.  She was found to have heart rates in the 40s as well as atrial fibrillation in the 120s.  She is status post [place] dual-chamber pacemaker implanted 07/28/2018.  She is also status post atrial fibrillation ablation 09/24/2020.  Today, denies symptoms of palpitations, chest pain, shortness of breath, orthopnea, PND, lower extremity edema, claudication, dizziness, presyncope, syncope, bleeding, or neurologic sequela. The patient is tolerating medications without difficulties.  Since last being seen she has done well.  She has no chest pain or shortness of breath.  Is able to hold her daily activities.  She is planned for ankle surgery for a replacement next November or December.  She has noted no further episodes of atrial fibrillation.  Her atrial fibrillation burden on device interrogation is less than 1%.  Past Medical History:  Diagnosis Date   Alcohol abuse    a. quit several years ago.   Allergy    Anemia    younger   Arthritis    feet,knees   B12 deficiency    BPPV (benign paroxysmal positional vertigo)    Cirrhosis (HCC)    Complication of anesthesia    "slow to wake"   Constipation    on movantik- stools regular and soft on this   COPD (chronic obstructive pulmonary  disease) (HCC)    Foot drop    GERD (gastroesophageal reflux disease)    Hyperlipidemia    Hypothyroidism    Neuromuscular disorder (HCC)    idiopathic neuropathy   Presence of permanent cardiac pacemaker 08/05/2018   pacemaker insertion for tachy/brady syndrome   Spinal cord stimulator status    Spinal stenosis    Tobacco abuse    Past Surgical History:  Procedure Laterality Date   ANKLE SURGERY     ATRIAL FIBRILLATION ABLATION N/A 10/02/2020   Procedure: ATRIAL FIBRILLATION ABLATION;  Surgeon: Constance Haw, MD;  Location: [facility];  Service: Cardiovascular;  Laterality: N/A;   CERVICAL CONE BIOPSY     CESAREAN SECTION     COLONOSCOPY     HERNIA REPAIR     KNEE SURGERY     x5   ORIF ANKLE FRACTURE Left 06/02/2018   Procedure: OPEN REDUCTION INTERNAL FIXATION (ORIF) ANKLE FRACTURE;  Surgeon: Wylene Simmer, MD;  Location: [facility];  Service: Orthopedics;  Laterality: Left;   ORIF ANKLE FRACTURE Left 08/26/2018   Procedure: Left ankle syndesmosis OPEN REDUCTION INTERNAL FIXATION (ORIF)/ reconstruction of deltoid ligament;  Surgeon: Wylene Simmer, MD;  Location: [facility];  Service: Orthopedics;  Laterality: Left;   PACEMAKER IMPLANT N/A 08/05/2018   Procedure: PACEMAKER IMPLANT;  Surgeon: Constance Haw, MD;  Location: [facility];  Service: Cardiovascular;  Laterality: N/A;   POLYPECTOMY     SPINAL CORD STIMULATOR INSERTION N/A 05/07/2016  Procedure: LUMBAR SPINAL CORD STIMULATOR INSERTION;  Surgeon: Melina Schools, MD;  Location: [facility];  Service: Orthopedics;  Laterality: N/A;     Current Outpatient Medications  Medication Sig Dispense Refill   albuterol (PROVENTIL HFA;VENTOLIN HFA) 108 (90 Base) MCG/ACT inhaler Inhale 2 puffs into the lungs every 6 (six) hours as needed for wheezing or shortness of breath.      B Complex Vitamins (B COMPLEX PO) Take 1 tablet by mouth daily.     BREO ELLIPTA 200-25 MCG/INH AEPB Inhale 1 puff into the  lungs daily.  11   Coenzyme Q10 (CO Q 10) 100 MG CAPS Take 100 mg by mouth at bedtime.     ELIQUIS 5 MG TABS tablet TAKE 1 TABLET BY MOUTH TWICE A DAY 180 tablet 1   famotidine (PEPCID) 20 MG tablet Take 20 mg by mouth 2 (two) times daily. New Zantac 469G  0   folic acid (FOLVITE) 1 MG tablet Take 1 mg by mouth daily.      hydrochlorothiazide (HYDRODIURIL) 25 MG tablet Take 25 mg by mouth daily.  1   HYDROmorphone (DILAUDID) 4 MG tablet Take 4 mg by mouth 5 (five) times daily as needed for severe pain.     levothyroxine (SYNTHROID) 25 MCG tablet Take 25 mcg by mouth daily before breakfast.      lisinopril (ZESTRIL) 2.5 MG tablet TAKE 1 TABLET BY MOUTH EVERY DAY 90 tablet 3   magnesium gluconate (MAGONATE) 500 MG tablet Take 500 mg by mouth at bedtime.     metaxalone (SKELAXIN) 800 MG tablet Take 800 mg by mouth 3 (three) times daily.     metoprolol succinate (TOPROL-XL) 100 MG 24 hr tablet Take 1 tablet (100 mg total) by mouth daily. Take with or immediately following a meal. 90 tablet 3   metoprolol succinate (TOPROL-XL) 50 MG 24 hr tablet Take 1 tablet (50 mg total) by mouth daily. Take with your 100 mg tablet to total 150 mg daily. Take with or immediately following a meal. 90 tablet 3   Olopatadine HCl (PATADAY) 0.7 % SOLN Place 1 drop into both eyes daily.     pregabalin (LYRICA) 150 MG capsule Take 150 mg by mouth 2 (two) times daily. Additional 150 mg in the middle of the day if needed     RESTASIS MULTIDOSE 0.05 % ophthalmic emulsion Place 1 drop into both eyes every evening.     Rosuvastatin Calcium 20 MG CPSP Take 20 mg by mouth daily.     senna (SENOKOT) 8.6 MG TABS tablet Take 2 tablets by mouth at bedtime.     tiZANidine (ZANAFLEX) 4 MG tablet Take 8 mg by mouth at bedtime.     Vitamin D, Ergocalciferol, (DRISDOL) 50000 units CAPS capsule Take 50,000 Units by mouth every Wednesday.      No current facility-administered medications for this visit.    Allergies:   Short ragweed  pollen ext, Sulfamethoxazole, and Vancomycin   Social History:  The patient  reports that she has been smoking cigarettes. She has a 30.00 pack-year smoking history. She has never used smokeless tobacco. She reports previous alcohol use. She reports that she does not use drugs.   Family History:  The patient's She was adopted. Family history is unknown by patient.   ROS:  Please see the history of present illness.   Otherwise, review of systems is positive for none.   All other systems are reviewed and negative.   PHYSICAL EXAM: VS:  BP 124/66  Pulse 92   Ht 5\' 9"  (1.753 m)   Wt 259 lb (117.5 kg)   LMP  (LMP Unknown)   BMI 38.25 kg/m  , BMI Body mass index is 38.25 kg/m. GEN: Well nourished, well developed, in no acute distress  HEENT: normal  Neck: no JVD, carotid bruits, or masses Cardiac: RRR; no murmurs, rubs, or gallops,no edema  Respiratory:  clear to auscultation bilaterally, normal work of breathing GI: soft, nontender, nondistended, + BS MS: no deformity or atrophy  Skin: warm and dry, device site well healed Neuro:  Strength and sensation are intact Psych: euthymic mood, full affect  EKG:  EKG is ordered today. Personal review of the ekg ordered shows sinus rhythm, left bundle branch block, rate 92  Personal review of the device interrogation today. Results in [facility]: 03/18/2020: ALT 37 09/18/2020: BUN 12; Creatinine, Ser 0.86; Hemoglobin 15.0; Platelets 166; Potassium 3.9; Sodium 143    Lipid Panel     Component Value Date/Time   CHOL 119 06/02/2018 0325   TRIG 104 06/02/2018 0325   HDL 39 (L) 06/02/2018 0325   CHOLHDL 3.1 06/02/2018 0325   VLDL 21 06/02/2018 0325   LDLCALC 59 06/02/2018 0325     Wt Readings from Last 3 Encounters:  03/18/21 259 lb (117.5 kg)  12/31/20 252 lb (114.3 kg)  11/01/20 254 lb 3.2 oz (115.3 kg)      Other studies Reviewed: Additional studies/ records that were reviewed today include: TTE 06/01/18  Review of  the above records today demonstrates:  - Left ventricle: Mild diffuse hypo kinesis with abnormal septal   motion likely from LBBB. The cavity size was mildly dilated. Wall   thickness was normal. Systolic function was mildly reduced. The   estimated ejection fraction was in the range of 45% to 50%. Left   ventricular diastolic function parameters were normal. - Mitral valve: Calcified annulus. Mildly thickened leaflets . - Left atrium: The atrium was moderately dilated. - Right ventricle: The cavity size was mildly dilated. - Right atrium: The atrium was moderately dilated. - Atrial septum: No defect or patent foramen ovale was identified.  Myoview 07/29/18 The left ventricular ejection fraction is moderately decreased (30-44%). Nuclear stress EF is calculated at 43% but appears better visually. There is paradoxical septal motion due to LBBB There was no ST segment deviation noted during stress. There is a medium defect of moderate severity present in the mid anteroseptal, apical anterior, apical septal and apex location. The defect is non-reversible and likely related to breast attenuation artifact. No ischemia noted. This is a low risk study.  30 day monitor 07/15/18 - personally reviewed NSR PAF rapid rates 160 Conversion pauses greater than 3 seconds  ASSESSMENT AND PLAN:  1.  Paroxysmal atrial fibrillation/flutter: Currently on Eliquis and Toprol-XL.  CHA2DS2-VASc of 2.  High risk medication monitoring.  Status post ablation 09/24/2020.    2.  Chronic systolic heart failure: Ejection fraction 45 to 50%.  No obvious volume overload.  No changes.    3.  Coronary atherosclerosis: Low risk Myoview.  No changes.  4.  Tachybradycardia syndrome: Status post [employer] dual-chamber pacemaker implanted 08/05/2018.  Device functioning appropriately.  No changes.    Current medicines are reviewed at length with the patient today.   The patient does not have concerns regarding her  medicines.  The following changes were made today: none  Labs/ tests ordered today include:  Orders Placed This Encounter  Procedures  EKG 12-Lead     Disposition:   FU with Akirra Lacerda 6 months  Signed, Iris Tatsch Meredith Leeds, MD  03/18/2021 11:18 AM     [facility] [address] [place] [address] [phone] (office) [phone] (fax)

## 2021-04-10 ENCOUNTER — Ambulatory Visit: Payer: BC Managed Care – PPO | Admitting: Physician Assistant

## 2021-05-07 ENCOUNTER — Encounter: Payer: Self-pay | Admitting: Physician Assistant

## 2021-05-07 ENCOUNTER — Ambulatory Visit: Payer: BC Managed Care – PPO | Admitting: Physician Assistant

## 2021-05-07 ENCOUNTER — Other Ambulatory Visit: Payer: Self-pay

## 2021-05-07 DIAGNOSIS — L918 Other hypertrophic disorders of the skin: Secondary | ICD-10-CM | POA: Diagnosis not present

## 2021-05-07 DIAGNOSIS — Z1283 Encounter for screening for malignant neoplasm of skin: Secondary | ICD-10-CM | POA: Diagnosis not present

## 2021-05-07 DIAGNOSIS — L729 Follicular cyst of the skin and subcutaneous tissue, unspecified: Secondary | ICD-10-CM

## 2021-05-07 NOTE — Progress Notes (Signed)
   Follow-Up Visit   Subjective  [name] is a 60 y.o. female who presents for the following: Skin Problem (Patient has a cyst on the left side of neck. [name] has some skin tags around neck that annoy patient. Check left eye. She has like a red lesion on the lid. She also has some rough places on her sideburns. No history of skin cancers. ).   The following portions of the chart were reviewed this encounter and updated as appropriate:  Tobacco  Allergies  Meds  Problems  Med Hx  Surg Hx  Fam Hx      Objective  Well appearing patient in no apparent distress; mood and affect are within normal limits.  A full examination was performed including scalp, head, eyes, ears, nose, lips, neck, chest, axillae, abdomen, back, buttocks, bilateral upper extremities, bilateral lower extremities, hands, feet, fingers, toes, fingernails, and toenails. All findings within normal limits unless otherwise noted below.  Head to toe No atypical nevi. No signs of non-mole skin cancer.    Assessment & Plan  Encounter for screening for malignant neoplasm of skin Head to toe  Yearly skin examinations    I, Tracy Mcewan, PA-C, have reviewed all documentation's for this visit.  The documentation on 05/07/21 for the exam, diagnosis, procedures and orders are all accurate and complete.

## 2021-05-13 ENCOUNTER — Ambulatory Visit: Payer: BC Managed Care – PPO

## 2021-05-14 LAB — CUP PACEART REMOTE DEVICE CHECK
Battery Remaining Longevity: 98 mo
Battery Remaining Percentage: 77 %
Battery Voltage: 3.01 V
Brady Statistic AP VP Percent: 1 %
Brady Statistic AP VS Percent: 73 %
Brady Statistic AS VP Percent: 1 %
Brady Statistic AS VS Percent: 27 %
Brady Statistic RA Percent Paced: 72 %
Brady Statistic RV Percent Paced: 1 %
Date Time Interrogation Session: 20220808020015
Implantable Lead Implant Date: 20191031
Implantable Lead Implant Date: 20191031
Implantable Lead Location: 753859
Implantable Lead Location: 753860
Implantable Pulse Generator Implant Date: 20191031
Lead Channel Impedance Value: 510 Ohm
Lead Channel Impedance Value: 590 Ohm
Lead Channel Pacing Threshold Amplitude: 0.5 V
Lead Channel Pacing Threshold Amplitude: 0.5 V
Lead Channel Pacing Threshold Pulse Width: 0.5 ms
Lead Channel Pacing Threshold Pulse Width: 0.5 ms
Lead Channel Sensing Intrinsic Amplitude: 12 mV
Lead Channel Sensing Intrinsic Amplitude: 5 mV
Lead Channel Setting Pacing Amplitude: 0.75 V
Lead Channel Setting Pacing Amplitude: 1.5 V
Lead Channel Setting Pacing Pulse Width: 0.5 ms
Lead Channel Setting Sensing Sensitivity: 2 mV
Pulse Gen Model: 2272
Pulse Gen Serial Number: 9079034

## 2021-05-22 ENCOUNTER — Encounter: Payer: Self-pay | Admitting: Physician Assistant

## 2021-06-19 ENCOUNTER — Other Ambulatory Visit: Payer: Self-pay | Admitting: Cardiology

## 2021-06-19 NOTE — Telephone Encounter (Signed)
Pt last saw Dr Curt Bears 03/18/21, last labs 06/13/21 Creat 0.94, age 60, weight 117.5 kg, based on specified criteria pt is on appropriate dosage of Eliquis '5mg'$  BID for afib.  Will refill rx.

## 2021-08-12 ENCOUNTER — Ambulatory Visit (INDEPENDENT_AMBULATORY_CARE_PROVIDER_SITE_OTHER): Payer: BC Managed Care – PPO

## 2021-08-12 DIAGNOSIS — I495 Sick sinus syndrome: Secondary | ICD-10-CM | POA: Diagnosis not present

## 2021-08-12 LAB — CUP PACEART REMOTE DEVICE CHECK
Battery Remaining Longevity: 95 mo
Battery Remaining Percentage: 74 %
Battery Voltage: 3.01 V
Brady Statistic AP VP Percent: 1 %
Brady Statistic AP VS Percent: 63 %
Brady Statistic AS VP Percent: 1 %
Brady Statistic AS VS Percent: 36 %
Brady Statistic RA Percent Paced: 63 %
Brady Statistic RV Percent Paced: 1 %
Date Time Interrogation Session: 20221107010013
Implantable Lead Implant Date: 20191031
Implantable Lead Implant Date: 20191031
Implantable Lead Location: 753859
Implantable Lead Location: 753860
Implantable Pulse Generator Implant Date: 20191031
Lead Channel Impedance Value: 530 Ohm
Lead Channel Impedance Value: 610 Ohm
Lead Channel Pacing Threshold Amplitude: 0.5 V
Lead Channel Pacing Threshold Amplitude: 0.5 V
Lead Channel Pacing Threshold Pulse Width: 0.5 ms
Lead Channel Pacing Threshold Pulse Width: 0.5 ms
Lead Channel Sensing Intrinsic Amplitude: 12 mV
Lead Channel Sensing Intrinsic Amplitude: 5 mV
Lead Channel Setting Pacing Amplitude: 0.75 V
Lead Channel Setting Pacing Amplitude: 1.5 V
Lead Channel Setting Pacing Pulse Width: 0.5 ms
Lead Channel Setting Sensing Sensitivity: 2 mV
Pulse Gen Model: 2272
Pulse Gen Serial Number: 9079034

## 2021-08-15 ENCOUNTER — Other Ambulatory Visit: Payer: Self-pay

## 2021-08-15 ENCOUNTER — Ambulatory Visit (INDEPENDENT_AMBULATORY_CARE_PROVIDER_SITE_OTHER): Payer: BC Managed Care – PPO | Admitting: Physician Assistant

## 2021-08-15 ENCOUNTER — Encounter: Payer: Self-pay | Admitting: Physician Assistant

## 2021-08-15 DIAGNOSIS — L720 Epidermal cyst: Secondary | ICD-10-CM | POA: Diagnosis not present

## 2021-08-15 DIAGNOSIS — D485 Neoplasm of uncertain behavior of skin: Secondary | ICD-10-CM

## 2021-08-15 NOTE — Patient Instructions (Signed)

## 2021-08-16 NOTE — Progress Notes (Signed)
Remote pacemaker transmission.   

## 2021-08-19 ENCOUNTER — Encounter: Payer: Self-pay | Admitting: Physician Assistant

## 2021-08-19 NOTE — Progress Notes (Signed)
   Follow-Up Visit   Subjective  [name] is a 60 y.o. female who presents for the following: Cyst (cyst).   The following portions of the chart were reviewed this encounter and updated as appropriate:  Tobacco  Allergies  Meds  Problems  Med Hx  Surg Hx  Fam Hx      Objective  Well appearing patient in no apparent distress; mood and affect are within normal limits.  A focused examination was performed including face and neck. Relevant physical exam findings are noted in the Assessment and Plan.  Left Submandibular Area Small dense nodule      Assessment & Plan  Neoplasm of uncertain behavior of skin Left Submandibular Area  Skin / nail biopsy Type of biopsy: punch   Informed consent: discussed and consent obtained   Timeout: patient name, date of birth, surgical site, and procedure verified   Procedure prep:  Patient was prepped and draped in usual sterile fashion (Non sterile) Prep type:  Chlorhexidine Anesthesia: the lesion was anesthetized in a standard fashion   Anesthetic:  1% lidocaine w/ epinephrine 1-100,000 local infiltration Punch size:  5 mm Suture size:  4-0 Suture type: nylon   Suture removal (days):  7 Hemostasis achieved with: suture, pressure, aluminum chloride and electrodesiccation   Outcome: patient tolerated procedure well   Post-procedure details: wound care instructions given    Specimen 1 - Surgical pathology Differential Diagnosis: cyst  Check Margins: No    I, Correy Weidner, PA-C, have reviewed all documentation's for this visit.  The documentation on 08/19/21 for the exam, diagnosis, procedures and orders are all accurate and complete.

## 2021-08-22 ENCOUNTER — Ambulatory Visit (INDEPENDENT_AMBULATORY_CARE_PROVIDER_SITE_OTHER): Payer: BC Managed Care – PPO | Admitting: *Deleted

## 2021-08-22 ENCOUNTER — Other Ambulatory Visit: Payer: Self-pay

## 2021-08-22 DIAGNOSIS — Z4802 Encounter for removal of sutures: Secondary | ICD-10-CM

## 2021-08-22 NOTE — Progress Notes (Signed)
Here for NTS suture removal. No signs or symptoms of infection. Path to patient. 

## 2021-09-24 ENCOUNTER — Encounter: Payer: BC Managed Care – PPO | Admitting: Cardiology

## 2021-10-03 ENCOUNTER — Encounter: Payer: Self-pay | Admitting: Cardiology

## 2021-10-11 ENCOUNTER — Telehealth: Payer: Self-pay | Admitting: *Deleted

## 2021-10-11 NOTE — Telephone Encounter (Signed)
Patient with diagnosis of afib on Eliquis for anticoagulation.    Procedure: left arthroplasty ankle with implant Date of procedure: 10/25/21  CHA2DS2-VASc Score = 4  This indicates a 4.8% annual risk of stroke. The patient's score is based upon: CHF History: 1 HTN History: 1 Diabetes History: 0 Stroke History: 0 Vascular Disease History: 1 Age Score: 0 Gender Score: 1   CrCl >171mL/min Platelet count 132K  Per office protocol, patient can hold Eliquis for 3 days prior to procedure as requested.

## 2021-10-11 NOTE — Telephone Encounter (Signed)
° °  Primary Cardiologist: Jenkins Rouge, MD  Chart reviewed as part of pre-operative protocol coverage. Given past medical history and time since last visit, based on ACC/AHA guidelines, MARSHAWN NINNEMAN would be at acceptable risk for the planned procedure without further cardiovascular testing.   Patient with diagnosis of afib on Eliquis for anticoagulation.     Procedure: left arthroplasty ankle with implant Date of procedure: 10/25/21   CHA2DS2-VASc Score = 4  This indicates a 4.8% annual risk of stroke. The patient's score is based upon: CHF History: 1 HTN History: 1 Diabetes History: 0 Stroke History: 0 Vascular Disease History: 1 Age Score: 0 Gender Score: 1   CrCl >149mL/min Platelet count 132K   Per office protocol, patient can hold Eliquis for 3 days prior to procedure as requested.   Patient was advised that if she develops new symptoms prior to surgery to contact our office to arrange a follow-up appointment.  She verbalized understanding.  I will route this recommendation to the requesting party via Epic fax function and remove from pre-op pool.  Please call with questions.  Jossie Ng. Temisha Murley NP-C    10/11/2021, 1:07 PM [address] [place] [address] Office [phone] Fax [phone]

## 2021-10-11 NOTE — Telephone Encounter (Signed)
° °  Pre-operative Risk Assessment    Patient Name: [name]  DOB: [birth year] MRN: [id number]      Request for Surgical Clearance    Procedure:   LEFT ARTHROPLASTY ANKLE w/IMPLANT (TOTAL ANKLE)  Date of Surgery:  Clearance 10/25/21 URGENT SURGERY                               Surgeon:  DR. Reece [place] or Practice Name:  [facility] Phone number:  [phone] Fax number:  [phone] (FOR eFAX) OR REGULAR FAX# [phone]   Type of Clearance Requested:   - Medical  - Pharmacy:  Hold Apixaban (Eliquis) x 3 DAYS PRIOR   Type of Anesthesia:   REGIONAL   Additional requests/questions:    Jiles Prows   10/11/2021, 10:58 AM

## 2021-10-31 ENCOUNTER — Encounter: Payer: Self-pay | Admitting: Cardiology

## 2021-11-05 ENCOUNTER — Other Ambulatory Visit: Payer: Self-pay | Admitting: Cardiology

## 2021-11-11 ENCOUNTER — Ambulatory Visit (INDEPENDENT_AMBULATORY_CARE_PROVIDER_SITE_OTHER): Payer: BC Managed Care – PPO

## 2021-11-11 DIAGNOSIS — I495 Sick sinus syndrome: Secondary | ICD-10-CM

## 2021-11-11 LAB — CUP PACEART REMOTE DEVICE CHECK
Battery Remaining Longevity: 90 mo
Battery Remaining Percentage: 72 %
Battery Voltage: 3.01 V
Brady Statistic AP VP Percent: 1 %
Brady Statistic AP VS Percent: 67 %
Brady Statistic AS VP Percent: 1 %
Brady Statistic AS VS Percent: 32 %
Brady Statistic RA Percent Paced: 67 %
Brady Statistic RV Percent Paced: 1 %
Date Time Interrogation Session: 20230206020017
Implantable Lead Implant Date: 20191031
Implantable Lead Implant Date: 20191031
Implantable Lead Location: 753859
Implantable Lead Location: 753860
Implantable Pulse Generator Implant Date: 20191031
Lead Channel Impedance Value: 480 Ohm
Lead Channel Impedance Value: 550 Ohm
Lead Channel Pacing Threshold Amplitude: 0.5 V
Lead Channel Pacing Threshold Amplitude: 0.625 V
Lead Channel Pacing Threshold Pulse Width: 0.5 ms
Lead Channel Pacing Threshold Pulse Width: 0.5 ms
Lead Channel Sensing Intrinsic Amplitude: 12 mV
Lead Channel Sensing Intrinsic Amplitude: 5 mV
Lead Channel Setting Pacing Amplitude: 0.875
Lead Channel Setting Pacing Amplitude: 1.5 V
Lead Channel Setting Pacing Pulse Width: 0.5 ms
Lead Channel Setting Sensing Sensitivity: 2 mV
Pulse Gen Model: 2272
Pulse Gen Serial Number: 9079034

## 2021-11-13 NOTE — Progress Notes (Signed)
Remote pacemaker transmission.   

## 2021-12-26 NOTE — Progress Notes (Addendum)
VINCY, FELIZ ([id number]) ?Visit Report for 12/27/2021 ?Allergy List Details ?Patient Name: Date of Service: ?[name], Tracy NCY B. 12/27/2021 9:00 A M ?Medical Record Number: [id number] ?Patient Account Number: [id number] ?Date of Birth/Sex: Treating RN: ?[birth year] (61 y.o. Female) ?Primary Care Aaliayah Miao: Bing Matter Other Clinician: ?Referring Ahmyah Gidley: ?Treating Evangelynn Lochridge/Extender: Kalman Shan ?Verl Dicker ?Weeks in Treatment: 0 ?Allergies ?Active Allergies ?weed pollen-short ragweed ?Reaction: Eye Redness ?Severity: Moderate ?Active: 12/14/2019 ?sulfamethoxazole ?Reaction: GI Upset ?Severity: Moderate ?Active: 03/31/2016 ?vancomycin ?Severity: Moderate ?Active: 03/14/2008 ?Allergy Notes ?Electronic Signature(s) ?Signed: 12/26/2021 4:35:21 PM By: Donavan Burnet CHT EMT BS ?, , ?Entered By: Donavan Burnet on 12/26/2021 16:35:21 ?-------------------------------------------------------------------------------- ?Arrival Information Details ?Patient Name: Date of Service: ?[name], Tracy NCY B. 12/27/2021 9:00 A M ?Medical Record Number: [id number] ?Patient Account Number: [id number] ?Date of Birth/Sex: Treating RN: ?[birth year] (61 y.o. Female) Rolin Barry, Tammi Klippel ?Primary Care Adelynne Joerger: Bing Matter Other Clinician: ?Referring Krisandra Bueno: ?Treating Kelcy Baeten/Extender: Kalman Shan ?Verl Dicker ?Weeks in Treatment: 0 ?Visit Information ?Patient Arrived: Wheel Chair ?Arrival Time: 09:15 ?Accompanied By: husband Richard ?Transfer Assistance: None ?Patient Identification Verified: Yes ?Secondary Verification Process Completed: Yes ?Patient Requires Transmission-Based Precautions: No ?Patient Has Alerts: Yes ?Patient Alerts: Patient on Blood Thinner ?Notes ?Patient had a soft cast ortho had placed. Patient in agreement to remove cast. Made patient aware we cannot replace cast and will need to call ortho if they want ?to replace the cast. Trayton Szabo in agreement. ?Electronic Signature(s) ?Signed: 12/27/2021 12:50:46 PM By:  Deon Pilling RN, BSN ?Entered By: Deon Pilling on 12/27/2021 10:20:32 ?-------------------------------------------------------------------------------- ?Clinic Level of Care Assessment Details ?Patient Name: Date of Service: ?[name], Tracy NCY B. 12/27/2021 9:00 A M ?Medical Record Number: [id number] ?Patient Account Number: [id number] ?Date of Birth/Sex: Treating RN: ?11-Jul-1961 (61 y.o. Female) Rolin Barry, Tammi Klippel ?Primary Care Lee Kalt: Bing Matter Other Clinician: ?Referring Mickey Esguerra: ?Treating Mauriah Mcmillen/Extender: Kalman Shan ?Verl Dicker ?Weeks in Treatment: 0 ?Clinic Level of Care Assessment Items ?TOOL 1 Quantity Score ?X- 1 0 ?Use when EandM and Procedure is performed on INITIAL visit ?ASSESSMENTS - Nursing Assessment / Reassessment ?X- 1 20 ?General Physical Exam (combine w/ comprehensive assessment (listed just below) when performed on new pt. evals) ?X- 1 25 ?Comprehensive Assessment (HX, ROS, Risk Assessments, Wounds Hx, etc.) ?ASSESSMENTS - Wound and Skin Assessment / Reassessment ?X- 1 10 ?Dermatologic / Skin Assessment (not related to wound area) ?ASSESSMENTS - Ostomy and/or Continence Assessment and Care ?'[]'$  - 0 ?Incontinence Assessment and Management ?'[]'$  - 0 ?Ostomy Care Assessment and Management (repouching, etc.) ?PROCESS - Coordination of Care ?X - Simple Patient / Family Education for ongoing care 1 15 ?'[]'$  - 0 ?Complex (extensive) Patient / Family Education for ongoing care ?X- 1 10 ?Staff obtains Consents, Records, T Results / Process Orders ?est ?'[]'$  - 0 ?Staff telephones HHA, Nursing Homes / Clarify orders / etc ?'[]'$  - 0 ?Routine Transfer to another Facility (non-emergent condition) ?'[]'$  - 0 ?Routine Hospital Admission (non-emergent condition) ?X- 1 15 ?New Admissions / Biomedical engineer / Ordering NPWT Apligraf, etc. ?, ?'[]'$  - 0 ?Emergency Hospital Admission (emergent condition) ?PROCESS - Special Needs ?'[]'$  - 0 ?Pediatric / Minor Patient Management ?'[]'$  - 0 ?Isolation Patient Management ?'[]'$   - 0 ?Hearing / Language / Visual special needs ?'[]'$  - 0 ?Assessment of Community assistance (transportation, D/C planning, etc.) ?'[]'$  - 0 ?Additional assistance / Altered mentation ?'[]'$  - 0 ?Support Surface(s) Assessment (bed, cushion, seat, etc.) ?INTERVENTIONS - Miscellaneous ?'[]'$  - 0 ?External ear exam ?'[]'$  - 0 ?Patient Transfer (multiple staff / Civil Service fast streamer /  Similar devices) ?'[]'$  - 0 ?Simple Staple / Suture removal (25 or less) ?'[]'$  - 0 ?Complex Staple / Suture removal (26 or more) ?'[]'$  - 0 ?Hypo/Hyperglycemic Management (do not check if billed separately) ?X- 1 15 ?Ankle / Brachial Index (ABI) - do not check if billed separately ?Has the patient been seen at the hospital within the last three years: Yes ?Total Score: 110 ?Level Of Care: New/Established - Level 3 ?Electronic Signature(s) ?Signed: 12/27/2021 12:50:46 PM By: Deon Pilling RN, BSN ?Entered By: Deon Pilling on 12/27/2021 12:26:41 ?-------------------------------------------------------------------------------- ?Encounter Discharge Information Details ?Patient Name: Date of Service: ?[name], Tracy NCY B. 12/27/2021 9:00 A M ?Medical Record Number: [id number] ?Patient Account Number: [id number] ?Date of Birth/Sex: Treating RN: ?1960-10-24 (62 y.o. Female) Rolin Barry, Tammi Klippel ?Primary Care Aliyana Dlugosz: Bing Matter Other Clinician: ?Referring Srikar Chiang: ?Treating Byron Peacock/Extender: Kalman Shan ?Verl Dicker ?Weeks in Treatment: 0 ?Encounter Discharge Information Items Post Procedure Vitals ?Discharge Condition: Stable ?Temperature (F): 98.5 ?Ambulatory Status: Wheelchair ?Pulse (bpm): 61 ?Discharge Destination: Home ?Respiratory Rate (breaths/min): 20 ?Transportation: Private Auto ?Blood Pressure (mmHg): 95/68 ?Accompanied By: husband ?Schedule Follow-up Appointment: Yes ?Clinical Summary of Care: ?Electronic Signature(s) ?Signed: 12/27/2021 12:50:46 PM By: Deon Pilling RN, BSN ?Entered By: Deon Pilling on 12/27/2021  12:28:39 ?-------------------------------------------------------------------------------- ?Lower Extremity Assessment Details ?Patient Name: Date of Service: ?[name], Tracy NCY B. 12/27/2021 9:00 A M ?Medical Record Number: [id number] ?Patient Account Number: [id number] ?Date of Birth/Sex: Treating RN: ?Dec 31, 1960 (61 y.o. Female) Rolin Barry, Tammi Klippel ?Primary Care Graves Nipp: Bing Matter Other Clinician: ?Referring Nilda Keathley: ?Treating Leiam Hopwood/Extender: Kalman Shan ?Verl Dicker ?Weeks in Treatment: 0 ?Edema Assessment ?Assessed: [Left: Yes] [Right: No] ?Edema: [Left: N] [Right: o] ?Calf ?Left: Right: ?Point of Measurement: 33 cm From Medial Instep 28 cm ?Ankle ?Left: Right: ?Point of Measurement: 12 cm From Medial Instep 21 cm ?Knee To Floor ?Left: Right: ?From Medial Instep 46 cm ?Vascular Assessment ?Pulses: ?Dorsalis Pedis ?Palpable: [Left:Yes] ?Doppler Audible: [Left:Yes] ?Posterior Tibial ?Palpable: [Left:Yes] ?Doppler Audible: [Left:Yes] ?Blood Pressure: ?Brachial: [Left:110] ?Ankle: ?[Left:Dorsalis Pedis: 146 1.33] ?Electronic Signature(s) ?Signed: 12/27/2021 12:50:46 PM By: Deon Pilling RN, BSN ?Entered By: Deon Pilling on 12/27/2021 09:46:47 ?-------------------------------------------------------------------------------- ?Multi Wound Chart Details ?Patient Name: ?Date of Service: ?[name], Tracy NCY B. 12/27/2021 9:00 A M ?Medical Record Number: [id number] ?Patient Account Number: [id number] ?Date of Birth/Sex: ?Treating RN: ?10/17/1960 (61 y.o. Female) ?Primary Care Valta Dillon: Bing Matter ?Other Clinician: ?Referring Livia Tarr: ?Treating Braydn Carneiro/Extender: Kalman Shan ?Verl Dicker ?Weeks in Treatment: 0 ?Vital Signs ?Height(in): 69 ?Pulse(bpm): 61 ?Weight(lbs): 250 ?Blood Pressure(mmHg): 95/58 ?Body Mass Index(BMI): 36.9 ?Temperature(??F): 98.5 ?Respiratory Rate(breaths/min): 20 ?Photos: [N/A:N/A] ?Left, Anterior Ankle Left, Medial Ankle N/A ?Wound Location: ?Surgical Injury Pressure Injury N/A ?Wounding  Event: ?Open Surgical Wound Pressure Ulcer N/A ?Primary Etiology: ?Arrhythmia, Cirrhosis , Osteoarthritis Arrhythmia, Cirrhosis , Osteoarthritis N/A ?Comorbid History: ?10/25/2021 12/04/2021 N/A ?Date Acquired: ?0 0 N/A ?Weeks of Treatment: ?Open Open N/A ?

## 2021-12-27 ENCOUNTER — Other Ambulatory Visit: Payer: Self-pay

## 2021-12-27 ENCOUNTER — Encounter (HOSPITAL_BASED_OUTPATIENT_CLINIC_OR_DEPARTMENT_OTHER): Payer: BC Managed Care – PPO | Attending: Internal Medicine | Admitting: Internal Medicine

## 2021-12-27 DIAGNOSIS — T8131XA Disruption of external operation (surgical) wound, not elsewhere classified, initial encounter: Secondary | ICD-10-CM | POA: Diagnosis not present

## 2021-12-27 DIAGNOSIS — L97518 Non-pressure chronic ulcer of other part of right foot with other specified severity: Secondary | ICD-10-CM | POA: Insufficient documentation

## 2021-12-27 DIAGNOSIS — G609 Hereditary and idiopathic neuropathy, unspecified: Secondary | ICD-10-CM | POA: Insufficient documentation

## 2021-12-27 DIAGNOSIS — Y831 Surgical operation with implant of artificial internal device as the cause of abnormal reaction of the patient, or of later complication, without mention of misadventure at the time of the procedure: Secondary | ICD-10-CM | POA: Diagnosis not present

## 2021-12-27 DIAGNOSIS — L89520 Pressure ulcer of left ankle, unstageable: Secondary | ICD-10-CM | POA: Insufficient documentation

## 2021-12-27 DIAGNOSIS — Z96662 Presence of left artificial ankle joint: Secondary | ICD-10-CM | POA: Diagnosis not present

## 2021-12-27 NOTE — Progress Notes (Signed)
[name], [name] ([id number]) ?Visit Report for 12/27/2021 ?Chief Complaint Document Details ?Patient Name: Date of Service: ?[name], [place] NCY B. 12/27/2021 9:00 A M ?Medical Record Number: [id number] ?Patient Account Number: [id number] ?Date of Birth/Sex: Treating RN: ?Jun 05, 1961 (61 y.o. Female) ?Primary Care Provider: Bing Matter Other Clinician: ?Referring Provider: ?Treating Provider/Extender: Kalman Shan ?Verl Dicker ?Weeks in Treatment: 0 ?Information Obtained from: Patient ?Chief Complaint ?Left foot wounds ?Electronic Signature(s) ?Signed: 12/27/2021 12:37:59 PM By: Kalman Shan DO ?Entered By: Kalman Shan on 12/27/2021 12:19:59 ?-------------------------------------------------------------------------------- ?Debridement Details ?Patient Name: Date of Service: ?[name], [place] NCY B. 12/27/2021 9:00 A M ?Medical Record Number: [id number] ?Patient Account Number: [id number] ?Date of Birth/Sex: Treating RN: ?Feb 22, 1961 (61 y.o. Female) Rolin Barry, Tammi Klippel ?Primary Care Provider: Bing Matter Other Clinician: ?Referring Provider: ?Treating Provider/Extender: Kalman Shan ?Verl Dicker ?Weeks in Treatment: 0 ?Debridement Performed for Assessment: Wound #1 Left,Anterior Ankle ?Performed By: Physician Kalman Shan, DO ?Debridement Type: Debridement ?Level of Consciousness (Pre-procedure): Awake and Alert ?Pre-procedure Verification/Time Out Yes - 10:12 ?Taken: ?Start Time: 10:13 ?Pain Control: ?Other : benzocaine 20% ?T Area Debrided (L x W): ?otal 1 (cm) x 1 (cm) = 1 (cm?) ?Tissue and other material debrided: Non-Viable, Eschar ?Level: Non-Viable Tissue ?Debridement Description: Selective/Open Wound ?Instrument: Curette ?Bleeding: None ?End Time: 10:18 ?Procedural Pain: 0 ?Post Procedural Pain: 0 ?Response to Treatment: Procedure was tolerated well ?Level of Consciousness (Post- Awake and Alert ?procedure): ?Post Debridement Measurements of Total Wound ?Length: (cm) 6 ?Width: (cm) 1.5 ?Depth: (cm)  0.1 ?Volume: (cm?) 0.707 ?Character of Wound/Ulcer Post Debridement: Stable ?Post Procedure Diagnosis ?Same as Pre-procedure ?Electronic Signature(s) ?Signed: 12/27/2021 12:37:59 PM By: Kalman Shan DO ?Signed: 12/27/2021 12:50:46 PM By: Deon Pilling RN, BSN ?Entered By: Deon Pilling on 12/27/2021 10:21:19 ?-------------------------------------------------------------------------------- ?Debridement Details ?Patient Name: ?Date of Service: ?[name], [place] NCY B. 12/27/2021 9:00 A M ?Medical Record Number: [id number] ?Patient Account Number: [id number] ?Date of Birth/Sex: ?Treating RN: ?November 07, 1960 (61 y.o. Female) Rolin Barry, Tammi Klippel ?Primary Care Provider: Bing Matter ?Other Clinician: ?Referring Provider: ?Treating Provider/Extender: Kalman Shan ?Verl Dicker ?Weeks in Treatment: 0 ?Debridement Performed for Assessment: Wound #2 Left,Medial Ankle ?Performed By: Physician Kalman Shan, DO ?Debridement Type: Debridement ?Level of Consciousness (Pre-procedure): Awake and Alert ?Pre-procedure Verification/Time Out Yes - 10:12 ?Taken: ?Start Time: 10:13 ?Pain Control: ?Other : benzocaine 20% ?T Area Debrided (L x W): ?otal 1.5 (cm) x 1 (cm) = 1.5 (cm?) ?Tissue and other material debrided: Non-Viable, Eschar, [place], [place] ?Level: Non-Viable Tissue ?Debridement Description: Selective/Open Wound ?Instrument: Curette ?Bleeding: None ?End Time: 10:18 ?Procedural Pain: 0 ?Post Procedural Pain: 0 ?Response to Treatment: Procedure was tolerated well ?Level of Consciousness (Post- Awake and Alert ?procedure): ?Post Debridement Measurements of Total Wound ?Length: (cm) 0.5 ?Stage: Unstageable/Unclassified ?Width: (cm) 0.5 ?Depth: (cm) 0.2 ?Volume: (cm?) 0.039 ?Character of Wound/Ulcer Post Debridement: Improved ?Post Procedure Diagnosis ?Same as Pre-procedure ?Electronic Signature(s) ?Signed: 12/27/2021 12:37:59 PM By: Kalman Shan DO ?Signed: 12/27/2021 12:50:46 PM By: Deon Pilling RN, BSN ?Entered By: Deon Pilling on  12/27/2021 12:27:11 ?-------------------------------------------------------------------------------- ?HPI Details ?Patient Name: ?Date of Service: ?[name], [place] NCY B. 12/27/2021 9:00 A M ?Medical Record Number: [id number] ?Patient Account Number: [id number] ?Date of Birth/Sex: ?Treating RN: ?12-15-1960 (61 y.o. Female) ?Primary Care Provider: Bing Matter ?Other Clinician: ?Referring Provider: ?Treating Provider/Extender: Kalman Shan ?Verl Dicker ?Weeks in Treatment: 0 ?History of Present Illness ?HPI Description: 12/27/2021 ?[name] is a 61 year old female with a past medical history of hereditary and idiopathic peripheral neuropathy, and left total ankle replacement that ?presents to the clinic  for a 20-monthhistory of wounds to her left foot. 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 She has been placed in ?a short leg cast for partial weightbearing. She states this is replaced every 2 weeks. She reports a sore spot to the left medial aspect of the ankle. She also ?has an incision along the dorsal aspect of the left foot with sutures in place and dried necrotic debris. She does not do wound care for the sites because she is ?always in a cast. She was referred to our clinic for discussion of hyperbaric oxygen treatment. It was discussed Prior to her appointment that she did not have ?an indication for hyperbaric oxygen treatment For insurance approval. She states she still wanted to continue her appointment in our clinic for potential wound ?care. She currently denies signs of infection. ?Electronic Signature(s) ?Signed: 12/27/2021 12:37:59 PM By: HKalman ShanDO ?Entered By: HKalman Shanon 12/27/2021 12:25:35 ?-------------------------------------------------------------------------------- ?Physical Exam Details ?Patient Name: Date of Service: ?[name] 12/27/2021 9:00 A M ?Medical Record Number: [id number]?Patient Account Number: [id number]?Date of Birth/Sex:  Treating RN: ?118-Jan-1962(62y.o. Female) ?Primary Care Provider: KBing MatterOther Clinician: ?Referring Provider: ?Treating Provider/Extender: HKalman Shan?EVerl Dicker?Weeks in Treatment: 0 ?Constitutional ?respirations regular, non-labored and within target range for patient..Marland Kitchen?Cardiovascular ?2+ dorsalis pedis/posterior tibialis pulses. ?Psychiatric ?pleasant and cooperative. ?Notes ?Left lower extremity: T the medial aspect there is an open wound with slough in the center. There is also an incision line to the dorsal aspect with sutures still in ?o ?place and dried necrotic debris. No surrounding signs of infection. ?Electronic Signature(s) ?Signed: 12/27/2021 12:37:59 PM By: HKalman ShanDO ?Entered By: HKalman Shanon 12/27/2021 12:26:21 ?-------------------------------------------------------------------------------- ?Physician Orders Details ?Patient Name: Date of Service: ?[name] 12/27/2021 9:00 A M ?Medical Record Number: [id number]?Patient Account Number: [id number]?Date of Birth/Sex: Treating RN: ?[birth year](61y.o. Female) DRolin Barry BTammi Klippel?Primary Care Provider: KBing MatterOther Clinician: ?Referring Provider: ?Treating Provider/Extender: HKalman Shan?EVerl Dicker?Weeks in Treatment: 0 ?Verbal / Phone Orders: No ?Diagnosis Coding ?Follow-up Appointments ?ppointment in 2 weeks. - [place] [place] [facility] 01/09/2022 1030am ?Return A ?Call if ortho wants wound care to treat wounds. ?Other: - Recommend dressing changes to area every couple of days with wounds. ?Call your ortho today about replacing cast. Discuss continue casting or wound care treatments with ortho. ?Wound Treatment ?Wound #1 - Ankle Wound Laterality: Left, Anterior ?Cleanser: Wound Cleanser 1 x Per Day/30 Days ?Discharge Instructions: Cleanse the wound with wound cleanser prior to applying a clean dressing using gauze sponges, not tissue or cotton balls. ?Peri-Wound Care: Sween Lotion (Moisturizing  lotion) 1 x Per Day/30 Days ?Discharge Instructions: Apply moisturizing lotion as directed ?Topical: Vaseline 1 x Per Day/30 Days ?Discharge Instructions: apply vaseline to surgical site. ?Secondary Dressin

## 2021-12-27 NOTE — Progress Notes (Signed)
JEZEL, BASTO ([id number]) ?Visit Report for 12/27/2021 ?Abuse Risk Screen Details ?Patient Name: Date of Service: ?[name], Tracy NCY B. 12/27/2021 9:00 A M ?Medical Record Number: [id number] ?Patient Account Number: [id number] ?Date of Birth/Sex: Treating RN: ?03-27-1961 (61 y.o. Female) Rolin Barry, Tammi Klippel ?Primary Care Dorisann Schwanke: Bing Matter Other Clinician: ?Referring Kennon Encinas: ?Treating Monta Police/Extender: Kalman Shan ?Verl Dicker ?Weeks in Treatment: 0 ?Abuse Risk Screen Items ?Answer ?ABUSE RISK SCREEN: ?Has anyone close to you tried to hurt or harm you recentlyo No ?Do you feel uncomfortable with anyone in your familyo No ?Has anyone forced you do things that you didnt want to doo No ?Electronic Signature(s) ?Signed: 12/27/2021 12:50:46 PM By: Deon Pilling RN, BSN ?Entered By: Deon Pilling on 12/27/2021 09:25:20 ?-------------------------------------------------------------------------------- ?Activities of Daily Living Details ?Patient Name: Date of Service: ?[name], Tracy NCY B. 12/27/2021 9:00 A M ?Medical Record Number: [id number] ?Patient Account Number: [id number] ?Date of Birth/Sex: Treating RN: ?08-Jun-1961 (61 y.o. Female) Rolin Barry, Tammi Klippel ?Primary Care Brynja Marker: Bing Matter Other Clinician: ?Referring Henretta Quist: ?Treating Kala Gassmann/Extender: Kalman Shan ?Verl Dicker ?Weeks in Treatment: 0 ?Activities of Daily Living Items ?Answer ?Activities of Daily Living (Please select one for each item) ?Drive Automobile Not Able ?T Medications ?ake Completely Able ?Use T elephone Completely Able ?Care for Appearance Completely Able ?Use T oilet Completely Able ?Bath / Shower Completely Able ?Dress Self Completely Able ?Feed Self Completely Able ?Walk Not Able ?Get In / Out Bed Completely Able ?Housework Need Assistance ?Prepare Meals Completely Able ?Handle Money Completely Able ?Shop for Self Need Assistance ?Electronic Signature(s) ?Signed: 12/27/2021 12:50:46 PM By: Deon Pilling RN, BSN ?Entered By: Deon Pilling on 12/27/2021 09:25:52 ?-------------------------------------------------------------------------------- ?Education Screening Details ?Patient Name: ?Date of Service: ?[name], Tracy NCY B. 12/27/2021 9:00 A M ?Medical Record Number: [id number] ?Patient Account Number: [id number] ?Date of Birth/Sex: ?Treating RN: ?[birth year] (61 y.o. Female) Rolin Barry, Tammi Klippel ?Primary Care Shaylene Paganelli: Bing Matter ?Other Clinician: ?Referring Gabriellah Rabel: ?Treating Damyah Gugel/Extender: Kalman Shan ?Verl Dicker ?Weeks in Treatment: 0 ?Primary Learner Assessed: Patient ?Learning Preferences/Education Level/Primary Language ?Learning Preference: Explanation, Demonstration, Printed Material ?Highest Education Level: College or Above ?Preferred Language: English ?Cognitive Barrier ?Language Barrier: No ?Translator Needed: No ?Memory Deficit: No ?Emotional Barrier: No ?Cultural/Religious Beliefs Affecting Medical Care: No ?Physical Barrier ?Impaired Vision: Yes Glasses, Contacts ?Impaired Hearing: No ?Decreased Hand dexterity: No ?Knowledge/Comprehension ?Knowledge Level: High ?Comprehension Level: High ?Ability to understand written instructions: High ?Ability to understand verbal instructions: High ?Motivation ?Anxiety Level: Calm ?Cooperation: Cooperative ?Education Importance: Acknowledges Need ?Interest in Health Problems: Asks Questions ?Perception: Coherent ?Willingness to Engage in Self-Management High ?Activities: ?Readiness to Engage in Self-Management High ?Activities: ?Electronic Signature(s) ?Signed: 12/27/2021 12:50:46 PM By: Deon Pilling RN, BSN ?Entered By: Deon Pilling on 12/27/2021 09:26:15 ?-------------------------------------------------------------------------------- ?Fall Risk Assessment Details ?Patient Name: ?Date of Service: ?[name], Tracy NCY B. 12/27/2021 9:00 A M ?Medical Record Number: [id number] ?Patient Account Number: [id number] ?Date of Birth/Sex: ?Treating RN: ?09-Jul-1961 (61 y.o. Female) Rolin Barry, Tammi Klippel ?Primary  Care Geana Walts: Bing Matter ?Other Clinician: ?Referring Nalani Andreen: ?Treating Jahmad Petrich/Extender: Kalman Shan ?Verl Dicker ?Weeks in Treatment: 0 ?Fall Risk Assessment Items ?Have you had 2 or more falls in the last 12 monthso 0 No ?Have you had any fall that resulted in injury in the last 12 monthso 0 No ?FALLS RISK SCREEN ?History of falling - immediate or within 3 months 0 No ?Secondary diagnosis (Do you have 2 or more medical diagnoseso) 0 No ?Ambulatory aid ?None/bed rest/wheelchair/nurse 0 Yes ?Crutches/cane/walker 0 No ?Furniture 0 No ?Intravenous therapy Access/Saline/Heparin Lock 0 No ?  Gait/Transferring ?Normal/ bed rest/ wheelchair 0 Yes ?Weak (short steps with or without shuffle, stooped but able to lift head while walking, may seek 0 No ?support from furniture) ?Impaired (short steps with shuffle, may have difficulty arising from chair, head down, impaired 0 No ?balance) ?Mental Status ?Oriented to own ability 0 Yes ?Electronic Signature(s) ?Signed: 12/27/2021 12:50:46 PM By: Deon Pilling RN, BSN ?Entered By: Deon Pilling on 12/27/2021 32:67:12 ?-------------------------------------------------------------------------------- ?Foot Assessment Details ?Patient Name: ?Date of Service: ?[name], Tracy NCY B. 12/27/2021 9:00 A M ?Medical Record Number: [id number] ?Patient Account Number: [id number] ?Date of Birth/Sex: ?Treating RN: ?[birth year] (61 y.o. Female) Rolin Barry, Tammi Klippel ?Primary Care Shadrack Brummitt: Bing Matter ?Other Clinician: ?Referring Maydelin Deming: ?Treating Coda Filler/Extender: Kalman Shan ?Verl Dicker ?Weeks in Treatment: 0 ?Foot Assessment Items ?Site Locations ?+ = Sensation present, - = Sensation absent, C = Callus, U = Ulcer ?R = Redness, W = Warmth, M = Maceration, PU = Pre-ulcerative lesion ?F = Fissure, S = Swelling, D = Dryness ?Assessment ?Right: Left: ?Other Deformity: No No ?Prior Foot Ulcer: No No ?Prior Amputation: No No ?Charcot Joint: No No ?Ambulatory Status: Ambulatory With  Help ?Assistance Device: Wheelchair ?Gait: Unsteady ?Notes ?non weight bearing. ?Electronic Signature(s) ?Signed: 12/27/2021 12:50:46 PM By: Deon Pilling RN, BSN ?Entered By: Deon Pilling on 12/27/2021 09:53:16 ?-------------------------------------------------------------------------------- ?Nutrition Risk Screening Details ?Patient Name: ?Date of Service: ?[name], Tracy NCY B. 12/27/2021 9:00 A M ?Medical Record Number: [id number] ?Patient Account Number: [id number] ?Date of Birth/Sex: ?Treating RN: ?1960-12-01 (61 y.o. Female) Rolin Barry, Tammi Klippel ?Primary Care Jordain Radin: Bing Matter ?Other Clinician: ?Referring Cynde Menard: ?Treating Lemarcus Baggerly/Extender: Kalman Shan ?Verl Dicker ?Weeks in Treatment: 0 ?Height (in): 69 ?Weight (lbs): 250 ?Body Mass Index (BMI): 36.9 ?Nutrition Risk Screening Items ?Score Screening ?NUTRITION RISK SCREEN: ?I have an illness or condition that made me change the kind and/or amount of food I eat 2 Yes ?I eat fewer than two meals per day 0 No ?I eat few fruits and vegetables, or milk products 0 No ?I have three or more drinks of beer, liquor or wine almost every day 0 No ?I have tooth or mouth problems that make it hard for me to eat 0 No ?I don't always have enough money to buy the food I need 0 No ?I eat alone most of the time 0 No ?I take three or more different prescribed or over-the-counter drugs a day 1 Yes ?Without wanting to, I have lost or gained 10 pounds in the last six months 0 No ?I am not always physically able to shop, cook and/or feed myself 0 No ?Nutrition Protocols ?Good Risk Protocol ?Moderate Risk Protocol 0 Provide education on nutrition ?High Risk Proctocol ?Risk Level: Moderate Risk ?Score: 3 ?Electronic Signature(s) ?Signed: 12/27/2021 12:50:46 PM By: Deon Pilling RN, BSN ?Entered By: Deon Pilling on 12/27/2021 09:26:38 ?

## 2022-01-09 ENCOUNTER — Encounter (HOSPITAL_BASED_OUTPATIENT_CLINIC_OR_DEPARTMENT_OTHER): Payer: BC Managed Care – PPO | Attending: Internal Medicine | Admitting: Internal Medicine

## 2022-01-09 DIAGNOSIS — F17200 Nicotine dependence, unspecified, uncomplicated: Secondary | ICD-10-CM | POA: Diagnosis not present

## 2022-01-09 DIAGNOSIS — Z96662 Presence of left artificial ankle joint: Secondary | ICD-10-CM | POA: Diagnosis not present

## 2022-01-09 DIAGNOSIS — X58XXXA Exposure to other specified factors, initial encounter: Secondary | ICD-10-CM | POA: Diagnosis not present

## 2022-01-09 DIAGNOSIS — T8131XA Disruption of external operation (surgical) wound, not elsewhere classified, initial encounter: Secondary | ICD-10-CM | POA: Insufficient documentation

## 2022-01-09 DIAGNOSIS — L97522 Non-pressure chronic ulcer of other part of left foot with fat layer exposed: Secondary | ICD-10-CM | POA: Insufficient documentation

## 2022-01-09 DIAGNOSIS — L89520 Pressure ulcer of left ankle, unstageable: Secondary | ICD-10-CM | POA: Insufficient documentation

## 2022-01-09 NOTE — Progress Notes (Signed)
[name] ([id number]) ?Visit Report for 01/09/2022 ?Chief Complaint Document Details ?Patient Name: Date of Service: ?[name], [place] NCY B. 01/09/2022 10:30 A M ?Medical Record Number: [id number] ?Patient Account Number: [id number] ?Date of Birth/Sex: Treating RN: ?1961/02/25 (61 y.o. F) Deaton, Bobbi ?Primary Care Provider: Bing Matter Other Clinician: ?Referring Provider: ?Treating Provider/Extender: Kalman Shan ?Bing Matter ?Weeks in Treatment: 1 ?Information Obtained from: Patient ?Chief Complaint ?Left foot wounds ?Electronic Signature(s) ?Signed: 01/09/2022 1:41:52 PM By: Kalman Shan DO ?Entered By: Kalman Shan on 01/09/2022 13:24:04 ?-------------------------------------------------------------------------------- ?Debridement Details ?Patient Name: Date of Service: ?[name], [place] NCY B. 01/09/2022 10:30 A M ?Medical Record Number: [id number] ?Patient Account Number: [id number] ?Date of Birth/Sex: Treating RN: ?March 18, 1961 (61 y.o. F) Deaton, Bobbi ?Primary Care Provider: Bing Matter Other Clinician: ?Referring Provider: ?Treating Provider/Extender: Kalman Shan ?Bing Matter ?Weeks in Treatment: 1 ?Debridement Performed for Assessment: Wound #1 Left,Anterior Ankle ?Performed By: Physician Kalman Shan, DO ?Debridement Type: Debridement ?Level of Consciousness (Pre-procedure): Awake and Alert ?Pre-procedure Verification/Time Out Yes - 11:00 ?Taken: ?Start Time: 11:01 ?Pain Control: ?Other : benzocaine 20% ?T Area Debrided (L x W): ?otal 5 (cm) x 1.4 (cm) = 7 (cm?) ?Tissue and other material debrided: Non-Viable, Eschar, Slough, Subcutaneous, Fibrin/Exudate, Slough ?Level: Skin/Subcutaneous Tissue ?Debridement Description: Excisional ?Instrument: Curette, Forceps, Scissors ?Bleeding: Minimum ?Hemostasis Achieved: Pressure ?End Time: 11:10 ?Procedural Pain: 0 ?Post Procedural Pain: 0 ?Response to Treatment: Procedure was tolerated well ?Level of Consciousness (Post- Awake and  Alert ?procedure): ?Post Debridement Measurements of Total Wound ?Length: (cm) 6 ?Width: (cm) 1.4 ?Depth: (cm) 0.1 ?Volume: (cm?) 0.66 ?Character of Wound/Ulcer Post Debridement: Improved ?Post Procedure Diagnosis ?Same as Pre-procedure ?Notes ?suture kit used to remove sutures as well. ?Electronic Signature(s) ?Signed: 01/09/2022 1:41:52 PM By: Kalman Shan DO ?Signed: 01/09/2022 5:34:01 PM By: Deon Pilling RN, BSN ?Entered By: Deon Pilling on 01/09/2022 11:11:02 ?-------------------------------------------------------------------------------- ?HPI Details ?Patient Name: Date of Service: ?[name], [place] NCY B. 01/09/2022 10:30 A M ?Medical Record Number: [id number] ?Patient Account Number: [id number] ?Date of Birth/Sex: Treating RN: ?1961/01/08 (61 y.o. F) Deaton, Bobbi ?Primary Care Provider: Bing Matter Other Clinician: ?Referring Provider: ?Treating Provider/Extender: Kalman Shan ?Bing Matter ?Weeks in Treatment: 1 ?History of Present Illness ?HPI Description: 12/27/2021 ?[name] is a 61 year old female with a past medical history of hereditary and idiopathic peripheral neuropathy, and left total ankle replacement that ?presents to the clinic for a 62-monthhistory of wounds to her left foot. 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 She has been placed in ?a short leg cast for partial weightbearing. She states this is replaced every 2 weeks. She reports a sore spot to the left medial aspect of the ankle. She also ?has an incision along the dorsal aspect of the left foot with sutures in place and dried necrotic debris. She does not do wound care for the sites because she is ?always in a cast. She was referred to our clinic for discussion of hyperbaric oxygen treatment. It was discussed Prior to her appointment that she did not have ?an indication for hyperbaric oxygen treatment For insurance approval. She states she still wanted to continue her appointment in our clinic for  potential wound ?care. She currently denies signs of infection. ?4/6; patient presents for follow-up. She discussed her case with her surgeon and for now she has been taken out of the short cast. We can proceed with wound ?care. She has been doing Hydrofera Blue to the medial left ankle wound and vaseline to the  suture site. She currently denies signs of infection. ?Electronic Signature(s) ?Signed: 01/09/2022 1:41:52 PM By: Kalman Shan DO ?Entered By: Kalman Shan on 01/09/2022 13:31:56 ?-------------------------------------------------------------------------------- ?Physical Exam Details ?Patient Name: Date of Service: ?[name], [place] NCY B. 01/09/2022 10:30 A M ?Medical Record Number: [id number] ?Patient Account Number: [id number] ?Date of Birth/Sex: Treating RN: ?04/17/1961 (61 y.o. F) Deaton, Bobbi ?Primary Care Provider: Bing Matter Other Clinician: ?Referring Provider: ?Treating Provider/Extender: Kalman Shan ?Bing Matter ?Weeks in Treatment: 1 ?Constitutional ?respirations regular, non-labored and within target range for patient.Marland Kitchen ?Cardiovascular ?2+ dorsalis pedis/posterior tibialis pulses. ?Psychiatric ?pleasant and cooperative. ?Notes ?Left lower extremity: Epithelization to the medial aspect of the ankle after scab was removed. There is an incision site to the dorsal aspect of the foot with ?sutures in place and dried necrotic debris. The sutures were taken out as well as the necrotic debris and there is an open wound with granulation tissue and ?nonviable tissue. No surrounding signs of infection. ?Electronic Signature(s) ?Signed: 01/09/2022 1:41:52 PM By: Kalman Shan DO ?Entered By: Kalman Shan on 01/09/2022 13:33:26 ?-------------------------------------------------------------------------------- ?Physician Orders Details ?Patient Name: Date of Service: ?[name], [place] NCY B. 01/09/2022 10:30 A M ?Medical Record Number: [id number] ?Patient Account Number: [id number] ?Date of Birth/Sex:  Treating RN: ?07-07-1961 (61 y.o. 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 - Dr. Heber  and [place], Room 8 01/23/2022 1115am Thursday ?Return A ?Other: - [facility]- wound care supplies ?Bathing/ Shower/ Hygiene ?May shower and wash wound with soap and water. - with dressing changes. ?Edema Control - Lymphedema / SCD / Other ?Moisturize legs daily. - may lotion legs- ensure not to apply in the wound. ?Non Wound Condition ?Protect area with: - the left medial ankle with bandaid for protection. ?Wound Treatment ?Wound #1 - Ankle Wound Laterality: Left, Anterior ?Cleanser: Soap and Water Every Other Day/30 Days ?Discharge Instructions: May shower and wash wound with dial antibacterial soap and water prior to dressing change. ?Cleanser: Wound Cleanser Every Other Day/30 Days ?Discharge Instructions: Cleanse the wound with wound cleanser prior to applying a clean dressing using gauze sponges, not tissue or cotton balls. ?Peri-Wound Care: Skin Prep Every Other Day/30 Days ?Discharge Instructions: Use skin prep as directed ?Peri-Wound Care: Sween Lotion (Moisturizing lotion) Every Other Day/30 Days ?Discharge Instructions: Apply moisturizing lotion as directed ?Prim Dressing: Hydrofera Blue Ready Foam, 4x5 in Every Other Day/30 Days ?ary ?Discharge Instructions: Apply to wound bed as instructed ?Secondary Dressing: Zetuvit Plus Silicone Border Dressing 5x5 (in/in) Every Other  Day/30 Days ?Discharge Instructions: Apply silicone border over primary dressing as directed. ?Electronic Signature(s) ?Signed: 01/09/2022 1:41:52 PM By: Kalman Shan DO ?Entered By: Kalman Shan on 04/06/202

## 2022-01-09 NOTE — Progress Notes (Signed)
TOYE, ROUILLARD ([id number]) ?Visit Report for 01/09/2022 ?Arrival Information Details ?Patient Name: Date of Service: ?[name], Tracy NCY B. 01/09/2022 10:30 A M ?Medical Record Number: [id number] ?Patient Account Number: [id number] ?Date of Birth/Sex: Treating RN: ?1961-05-29 (61 y.o. F) Deaton, Bobbi ?Primary Care Richardson Dubree: Bing Matter Other Clinician: ?Referring Rogerio Boutelle: ?Treating Lowanda Cashaw/Extender: Kalman Shan ?Bing Matter ?Weeks in Treatment: 1 ?Visit Information History Since Last Visit ?Added or deleted any medications: No ?Patient Arrived: Wheel Chair ?Any new allergies or adverse reactions: No ?Arrival Time: 10:40 ?Had a fall or experienced change in No ?Accompanied By: husband ?activities of daily living that may affect ?Transfer Assistance: Manual ?risk of falls: ?Patient Identification Verified: Yes ?Signs or symptoms of abuse/neglect since last visito No ?Secondary Verification Process Completed: Yes ?Hospitalized since last visit: No ?Patient Requires Transmission-Based Precautions: No ?Implantable device outside of the clinic excluding No ?Patient Has Alerts: Yes ?cellular tissue based products placed in the center ?Patient Alerts: Patient on Blood Thinner since last visit: ?Has Dressing in Place as Prescribed: Yes ?Pain Present Now: Yes ?Notes ?per patient othro said to provide wound care and return 01/23/2022 for reevaluation. Keyoni Lapinski made aware. ?Electronic Signature(s) ?Signed: 01/09/2022 5:34:01 PM By: Deon Pilling RN, BSN ?Entered By: Deon Pilling on 01/09/2022 10:44:47 ?-------------------------------------------------------------------------------- ?Encounter Discharge Information Details ?Patient Name: Date of Service: ?[name], Tracy NCY B. 01/09/2022 10:30 A M ?Medical Record Number: [id number] ?Patient Account Number: [id number] ?Date of Birth/Sex: Treating RN: ?Jan 14, 1961 (61 y.o. F) Deaton, Bobbi ?Primary Care Siyon Linck: Bing Matter Other Clinician: ?Referring Horice Carrero: ?Treating  Terrika Zuver/Extender: Kalman Shan ?Bing Matter ?Weeks in Treatment: 1 ?Encounter Discharge Information Items Post Procedure Vitals ?Discharge Condition: Stable ?Temperature (F): 98 ?Ambulatory Status: Wheelchair ?Pulse (bpm): 64 ?Discharge Destination: Home ?Respiratory Rate (breaths/min): 20 ?Transportation: Private Auto ?Blood Pressure (mmHg): 108/70 ?Accompanied By: husband ?Schedule Follow-up Appointment: Yes ?Clinical Summary of Care: ?Electronic Signature(s) ?Signed: 01/09/2022 5:34:01 PM By: Deon Pilling RN, BSN ?Entered By: Deon Pilling on 01/09/2022 11:30:32 ?-------------------------------------------------------------------------------- ?Lower Extremity Assessment Details ?Patient Name: ?Date of Service: ?[name], Tracy NCY B. 01/09/2022 10:30 A M ?Medical Record Number: [id number] ?Patient Account Number: [id number] ?Date of Birth/Sex: ?Treating RN: ?09-28-1961 (61 y.o. F) Deaton, Bobbi ?Primary Care Earlie Schank: Bing Matter ?Other Clinician: ?Referring Lakashia Collison: ?Treating Ajwa Kimberley/Extender: Kalman Shan ?Bing Matter ?Weeks in Treatment: 1 ?Edema Assessment ?Assessed: [Left: Yes] [Right: No] ?Edema: [Left: N] [Right: o] ?Calf ?Left: Right: ?Point of Measurement: 33 cm From Medial Instep 25 cm ?Ankle ?Left: Right: ?Point of Measurement: 12 cm From Medial Instep 19 cm ?Vascular Assessment ?Pulses: ?Dorsalis Pedis ?Palpable: [Left:Yes] ?Electronic Signature(s) ?Signed: 01/09/2022 5:34:01 PM By: Deon Pilling RN, BSN ?Entered By: Deon Pilling on 01/09/2022 10:47:51 ?-------------------------------------------------------------------------------- ?Multi Wound Chart Details ?Patient Name: ?Date of Service: ?[name], Tracy NCY B. 01/09/2022 10:30 A M ?Medical Record Number: [id number] ?Patient Account Number: [id number] ?Date of Birth/Sex: ?Treating RN: ?April 21, 1961 (61 y.o. F) Deaton, Bobbi ?Primary Care Beckey Polkowski: Bing Matter ?Other Clinician: ?Referring Kayven Aldaco: ?Treating Sher Shampine/Extender: Kalman Shan ?Bing Matter ?Weeks in Treatment: 1 ?Vital Signs ?Height(in): 69 ?Pulse(bpm): 64 ?Weight(lbs): 250 ?Blood Pressure(mmHg): 108/70 ?Body Mass Index(BMI): 36.9 ?Temperature(??F): 98 ?Respiratory Rate(breaths/min): 20 ?Photos: [1:Left, Anterior Ankle] [2:Left, Medial Ankle] [N/A:N/A N/A] ?Wound Location: [1:Surgical Injury] [2:Pressure Injury] [N/A:N/A] ?Wounding Event: [1:Open Surgical Wound] [2:Pressure Ulcer] [N/A:N/A] ?Primary Etiology: [1:Arrhythmia, Cirrhosis , Osteoarthritis] [2:Arrhythmia, Cirrhosis , Osteoarthritis] [N/A:N/A] ?Comorbid History: [1:10/25/2021] [2:12/04/2021] [N/A:N/A] ?Date Acquired: [1:1] [2:1] [N/A:N/A] ?Weeks of Treatment: [1:Open] [2:Healed - Epithelialized] [N/A:N/A] ?Wound Status: [1:No] [2:No] [N/A:N/A] ?Wound Recurrence: [1:6x1.4x0.1] [2:0x0x0] [N/A:N/A] ?Measurements L x  W x D (cm) [1:6.597] [2:0] [N/A:N/A] ?A (cm?) : ?rea [1:0.66] [2:0] [N/A:N/A] ?Volume (cm?) : [1:6.70%] [2:100.00%] [N/A:N/A] ?% Reduction in A rea: [1:6.60%] [2:100.00%] [N/A:N/A] ?% Reduction in Volume: [1:Unclassifiable] [2:Unstageable/Unclassified] [N/A:N/A] ?Classification: [1:Medium] [2:Medium] [N/A:N/A] ?Exudate A mount: [1:Serosanguineous] [2:Serosanguineous] [N/A:N/A] ?Exudate Type: [1:red, brown] [2:red, brown] [N/A:N/A] ?Exudate Color: [1:Distinct, outline attached] [2:Distinct, outline attached] [N/A:N/A] ?Wound Margin: [1:N/A] [2:None Present (0%)] [N/A:N/A] ?Granulation A mount: [1:N/A] [2:Large (67-100%)] [N/A:N/A] ?Necrotic A mount: [1:Eschar] [2:Eschar] [N/A:N/A] ?Necrotic Tissue: ?[1:Fascia: No] ?[2:Fascia: No] [N/A:N/A] ?Exposed Structures: ?[1:Fat Layer (Subcutaneous Tissue): No Tendon: No Muscle: No Joint: No Bone: No Small (1-33%)] [2:Fat Layer (Subcutaneous Tissue): No Tendon: No Muscle: No Joint: No Bone: No Small (1-33%)] [N/A:N/A] ?Epithelialization: [1:Debridement - Excisional] [2:N/A] [N/A:N/A] ?Debridement: ?Pre-procedure Verification/Time Out 11:00 [2:N/A] [N/A:N/A] ?Taken:  [1:Other] [2:N/A] [N/A:N/A] ?Pain Control: [1:Necrotic/Eschar, Subcutaneous,] [2:N/A] [N/A:N/A] ?Tissue Debrided: [1:Slough Skin/Subcutaneous Tissue] [2:N/A] [N/A:N/A] ?Level: [1:7] [2:N/A] [N/A:N/A] ?Debridement A (sq cm): [1:rea Curette, Forceps, Scissors] [2:N/A] [N/A:N/A] ?Instrument: [1:Minimum] [2:N/A] [N/A:N/A] ?Bleeding: [1:Pressure] [2:N/A] [N/A:N/A] ?Hemostasis Achieved: [1:0] [2:N/A] [N/A:N/A] ?Procedural Pain: [1:0] [2:N/A] [N/A:N/A] ?Post Procedural Pain: ?Debridement Treatment Response: Procedure was tolerated well [2:N/A] [N/A:N/A] ?Post Debridement Measurements L x 6x1.4x0.1 [2:N/A] [N/A:N/A] ?W x D (cm) [1:0.66] [2:N/A] [N/A:N/A] ?Post Debridement Volume: (cm?) [1:7 sutures intact] [2:N/A] [N/A:N/A] ?Assessment Notes: [1:Debridement] [2:N/A] [N/A:N/A] ?Treatment Notes ?Wound #1 (Ankle) Wound Laterality: Left, Anterior ?Cleanser ?Soap and Water ?Discharge Instruction: May shower and wash wound with dial antibacterial soap and water prior to dressing change. ?Wound Cleanser ?Discharge Instruction: Cleanse the wound with wound cleanser prior to applying a clean dressing using gauze sponges, not tissue or cotton balls. ?Peri-Wound Care ?Skin Prep ?Discharge Instruction: Use skin prep as directed ?Sween Lotion (Moisturizing lotion) ?Discharge Instruction: Apply moisturizing lotion as directed ?Topical ?Primary Dressing ?Hydrofera Blue Ready Foam, 4x5 in ?Discharge Instruction: Apply to wound bed as instructed ?Secondary Dressing ?Zetuvit Plus Silicone Border Dressing 5x5 (in/in) ?Discharge Instruction: Apply silicone border over primary dressing as directed. ?Secured With ?Compression Wrap ?Compression Stockings ?Add-Ons ?Wound #2 (Ankle) Wound Laterality: Left, Medial ?Cleanser ?Peri-Wound Care ?Topical ?Primary Dressing ?Secondary Dressing ?Secured With ?Compression Wrap ?Compression Stockings ?Add-Ons ?Electronic Signature(s) ?Signed: 01/09/2022 1:41:52 PM By: Kalman Shan DO ?Signed: 01/09/2022  5:34:01 PM By: Deon Pilling RN, BSN ?Entered By: Kalman Shan on 01/09/2022 13:23:53 ?-------------------------------------------------------------------------------- ?Multi-Disciplinary Care Plan Details ?Patient Name: ?

## 2022-01-14 ENCOUNTER — Encounter: Payer: BC Managed Care – PPO | Admitting: Cardiology

## 2022-01-14 ENCOUNTER — Encounter: Payer: Self-pay | Admitting: Cardiology

## 2022-01-14 ENCOUNTER — Telehealth: Payer: Self-pay | Admitting: Cardiology

## 2022-01-14 ENCOUNTER — Telehealth: Payer: Self-pay | Admitting: *Deleted

## 2022-01-14 DIAGNOSIS — I495 Sick sinus syndrome: Secondary | ICD-10-CM

## 2022-01-14 DIAGNOSIS — I2510 Atherosclerotic heart disease of native coronary artery without angina pectoris: Secondary | ICD-10-CM

## 2022-01-14 DIAGNOSIS — I480 Paroxysmal atrial fibrillation: Secondary | ICD-10-CM

## 2022-01-14 DIAGNOSIS — I483 Typical atrial flutter: Secondary | ICD-10-CM

## 2022-01-14 DIAGNOSIS — I5022 Chronic systolic (congestive) heart failure: Secondary | ICD-10-CM

## 2022-01-14 NOTE — Telephone Encounter (Signed)
?  Patient Consent for Virtual Visit  ? ? ?   ? ?[name] has provided verbal consent on 01/14/2022 for a virtual visit (video or telephone). ? ? ?CONSENT FOR VIRTUAL VISIT FOR:  [name]  ?By participating in this virtual visit I agree to the following: ? ?I hereby voluntarily request, consent and authorize [facility] and its employed or contracted physicians, physician assistants, nurse practitioners or other licensed health care professionals (the Practitioner), to provide me with telemedicine health care services (the ?Services") as deemed necessary by the treating Practitioner. I acknowledge and consent to receive the Services by the Practitioner via telemedicine. I understand that the telemedicine visit will involve communicating with the Practitioner through live audiovisual communication technology and the disclosure of certain medical information by electronic transmission. I acknowledge that I have been given the opportunity to request an in-person assessment or other available alternative prior to the telemedicine visit and am voluntarily participating in the telemedicine visit. ? ?I understand that I have the right to withhold or withdraw my consent to the use of telemedicine in the course of my care at any time, without affecting my right to future care or treatment, and that the Practitioner or I may terminate the telemedicine visit at any time. I understand that I have the right to inspect all information obtained and/or recorded in the course of the telemedicine visit and may receive copies of available information for a reasonable fee.  I understand that some of the potential risks of receiving the Services via telemedicine include:  ?Delay or interruption in medical evaluation due to technological equipment failure or disruption; ?Information transmitted may not be sufficient (e.g. poor resolution of images) to allow for appropriate medical decision making by the Practitioner; and/or   ?In rare instances, security protocols could fail, causing a breach of personal health information. ? ?Furthermore, I acknowledge that it is my responsibility to provide information about my medical history, conditions and care that is complete and accurate to the best of my ability. I acknowledge that Practitioner's advice, recommendations, and/or decision may be based on factors not within their control, such as incomplete or inaccurate data provided by me or distortions of diagnostic images or specimens that may result from electronic transmissions. I understand that the practice of medicine is not an exact science and that Practitioner makes no warranties or guarantees regarding treatment outcomes. I acknowledge that a copy of this consent can be made available to me via my patient portal ([place]), or I can request a printed copy by calling the office of [facility].   ? ?I understand that my insurance will be billed for this visit.  ? ?I have read or had this consent read to me. ?I understand the contents of this consent, which adequately explains the benefits and risks of the Services being provided via telemedicine.  ?I have been provided ample opportunity to ask questions regarding this consent and the Services and have had my questions answered to my satisfaction. ?I give my informed consent for the services to be provided through the use of telemedicine in my medical care ? ?  ?

## 2022-01-14 NOTE — Telephone Encounter (Signed)
See my chart message

## 2022-01-14 NOTE — Progress Notes (Signed)
This encounter was created in error - please disregard.

## 2022-01-14 NOTE — Telephone Encounter (Signed)
Patient states during virtual appointment today her audio was not working and Dr. Curt Bears was unable to hear her. She states Dr. Curt Bears suggested rescheduling. Is there a specific date/time that Dr. Curt Bears would be willing to have another virtual? Please advise. ?

## 2022-01-21 ENCOUNTER — Encounter (HOSPITAL_BASED_OUTPATIENT_CLINIC_OR_DEPARTMENT_OTHER): Payer: BC Managed Care – PPO | Admitting: Internal Medicine

## 2022-01-27 ENCOUNTER — Other Ambulatory Visit: Payer: Self-pay | Admitting: Cardiology

## 2022-01-27 DIAGNOSIS — I480 Paroxysmal atrial fibrillation: Secondary | ICD-10-CM

## 2022-01-27 NOTE — Telephone Encounter (Signed)
Eliquis '5mg'$  refill request received. Patient is 61 years old, weight-117.5kg, Crea-0.70 on 10/10/2021 via [facility] from [place], [place], and last seen by Dr. Curt Bears on 03/18/2021 and was due 6 months from this appt,Telemedicine visit was scheduled but technical difficulties, will send a message to schedulers. Dose is appropriate based on dosing criteria. Also, will send in a refill to requested pharmacy. ? ?

## 2022-01-28 ENCOUNTER — Encounter (HOSPITAL_BASED_OUTPATIENT_CLINIC_OR_DEPARTMENT_OTHER): Payer: BC Managed Care – PPO | Admitting: Internal Medicine

## 2022-01-28 DIAGNOSIS — L89520 Pressure ulcer of left ankle, unstageable: Secondary | ICD-10-CM | POA: Diagnosis not present

## 2022-01-28 DIAGNOSIS — L97522 Non-pressure chronic ulcer of other part of left foot with fat layer exposed: Secondary | ICD-10-CM

## 2022-01-29 NOTE — Progress Notes (Signed)
JUN, RIGHTMYER ([id number]) ?Visit Report for 01/28/2022 ?Chief Complaint Document Details ?Patient Name: Date of Service: ?[name], [place] B. 01/28/2022 11:15 A M ?Medical Record Number: [id number] ?Patient Account Number: [id number] ?Date of Birth/Sex: Treating RN: ?1961/06/05 (61 y.o. F) Deaton, Bobbi ?Primary Care Provider: Bing Matter Other Clinician: ?Referring Provider: ?Treating Provider/Extender: Kalman Shan ?Bing Matter ?Weeks in Treatment: 4 ?Information Obtained from: Patient ?Chief Complaint ?Left foot wounds ?Electronic Signature(s) ?Signed: 01/29/2022 9:05:19 AM By: Kalman Shan DO ?Entered By: Kalman Shan on 01/29/2022 08:59:16 ?-------------------------------------------------------------------------------- ?Debridement Details ?Patient Name: Date of Service: ?[name], [place] B. 01/28/2022 11:15 A M ?Medical Record Number: [id number] ?Patient Account Number: [id number] ?Date of Birth/Sex: Treating RN: ?1960/12/08 (61 y.o. F) Deaton, Bobbi ?Primary Care Provider: Bing Matter Other Clinician: ?Referring Provider: ?Treating Provider/Extender: Kalman Shan ?Bing Matter ?Weeks in Treatment: 4 ?Debridement Performed for Assessment: Wound #1 Left,Anterior Ankle ?Performed By: Physician Kalman Shan, DO ?Debridement Type: Debridement ?Level of Consciousness (Pre-procedure): Awake and Alert ?Pre-procedure Verification/Time Out Yes - 11:35 ?Taken: ?Start Time: 11:36 ?Pain Control: Lidocaine 4% T opical Solution ?T Area Debrided (L x W): ?otal 1 (cm) x 1 (cm) = 1 (cm?) ?Tissue and other material debrided: ?Viable, Non-Viable, Eschar, Slough, Subcutaneous, Skin: Dermis , Skin: Epidermis, Slough ?Level: Skin/Subcutaneous Tissue ?Debridement Description: Excisional ?Instrument: Curette ?Bleeding: Minimum ?Hemostasis Achieved: Pressure ?End Time: 11:43 ?Procedural Pain: 0 ?Post Procedural Pain: 0 ?Response to Treatment: Procedure was tolerated well ?Level of Consciousness (Post- Awake  and Alert ?procedure): ?Post Debridement Measurements of Total Wound ?Length: (cm) 0.2 ?Width: (cm) 0.2 ?Depth: (cm) 0.1 ?Volume: (cm?) 0.003 ?Character of Wound/Ulcer Post Debridement: Improved ?Post Procedure Diagnosis ?Same as Pre-procedure ?Electronic Signature(s) ?Signed: 01/28/2022 5:13:01 PM By: Deon Pilling RN, BSN ?Signed: 01/29/2022 9:05:19 AM By: Kalman Shan DO ?Entered By: Deon Pilling on 01/28/2022 11:43:15 ?-------------------------------------------------------------------------------- ?HPI Details ?Patient Name: Date of Service: ?[name], [place] B. 01/28/2022 11:15 A M ?Medical Record Number: [id number] ?Patient Account Number: [id number] ?Date of Birth/Sex: Treating RN: ?[birth year] (61 y.o. F) Deaton, Bobbi ?Primary Care Provider: Bing Matter Other Clinician: ?Referring Provider: ?Treating Provider/Extender: Kalman Shan ?Bing Matter ?Weeks in Treatment: 4 ?History of Present Illness ?HPI Description: 12/27/2021 ?[name] is a 61 year old female with a past medical history of hereditary and idiopathic peripheral neuropathy, and left total ankle replacement that ?presents to the clinic for a 74-monthhistory of wounds to her left foot. 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 She has been placed in ?a short leg cast for partial weightbearing. She states this is replaced every 2 weeks. She reports a sore spot to the left medial aspect of the ankle. She also ?has an incision along the dorsal aspect of the left foot with sutures in place and dried necrotic debris. She does not do wound care for the sites because she is ?always in a cast. She was referred to our clinic for discussion of hyperbaric oxygen treatment. It was discussed Prior to her appointment that she did not have ?an indication for hyperbaric oxygen treatment For insurance approval. She states she still wanted to continue her appointment in our clinic for potential wound ?care. She currently denies  signs of infection. ?4/6; patient presents for follow-up. She discussed her case with her surgeon and for now she has been taken out of the short cast. We can proceed with wound ?care. She has been doing Hydrofera Blue to the medial left ankle wound and vaseline to the suture site. She currently denies  signs of infection. ?4/26; patient presents for follow-up. She has been using Hydrofera Blue to the anterior left foot wound. She denies signs of infection. She has no issues or ?complaints today. She just started using a walking boot. ?Electronic Signature(s) ?Signed: 01/29/2022 9:05:19 AM By: Kalman Shan DO ?Entered By: Kalman Shan on 01/29/2022 09:01:05 ?-------------------------------------------------------------------------------- ?Physical Exam Details ?Patient Name: Date of Service: ?[name], [place] B. 01/28/2022 11:15 A M ?Medical Record Number: [id number] ?Patient Account Number: [id number] ?Date of Birth/Sex: Treating RN: ?18-Jun-1961 (61 y.o. F) Deaton, Bobbi ?Primary Care Provider: Bing Matter Other Clinician: ?Referring Provider: ?Treating Provider/Extender: Kalman Shan ?Bing Matter ?Weeks in Treatment: 4 ?Constitutional ?respirations regular, non-labored and within target range for patient.Marland Kitchen ?Cardiovascular ?2+ dorsalis pedis/posterior tibialis pulses. ?Psychiatric ?pleasant and cooperative. ?Notes ?Left lower extremity: T the previous incision site to the dorsal aspect of the left foot there is devitalized tissue. This was debrided and there are 2 small ?o ?remaining open wounds. Appears well-healing. No signs of surrounding infection. ?Electronic Signature(s) ?Signed: 01/29/2022 9:05:19 AM By: Kalman Shan DO ?Entered By: Kalman Shan on 01/29/2022 09:02:32 ?-------------------------------------------------------------------------------- ?Physician Orders Details ?Patient Name: Date of Service: ?[name], [place] B. 01/28/2022 11:15 A M ?Medical Record Number: [id number] ?Patient  Account Number: [id number] ?Date of Birth/Sex: Treating RN: ?1960-12-04 (61 y.o. F) Deaton, Bobbi ?Primary Care Provider: Bing Matter Other Clinician: ?Referring Provider: ?Treating Provider/Extender: Kalman Shan ?Bing Matter ?Weeks in Treatment: 4 ?Verbal / Phone Orders: No ?Diagnosis Coding ?Follow-up Appointments ?ppointment in 2 weeks. - Dr. Heber Emerald and [place], Room 8 02/11/2022 1115am Tuesday ?Return A ?Other: - [facility]- wound care supplies ?Bathing/ Shower/ Hygiene ?May shower and wash wound with soap and water. - with dressing changes. ?Edema Control - Lymphedema / SCD / Other ?Moisturize legs daily. - may lotion legs- ensure not to apply in the wound. ?Non Wound Condition ?Protect area with: - the left medial ankle with bandaid for protection. ?Wound Treatment ?Wound #1 - Ankle Wound Laterality: Left, Anterior ?Cleanser: Soap and Water Every Other Day/30 Days ?Discharge Instructions: May shower and wash wound with dial antibacterial soap and water prior to dressing change. ?Cleanser: Wound Cleanser Every Other Day/30 Days ?Discharge Instructions: Cleanse the wound with wound cleanser prior to applying a clean dressing using gauze sponges, not tissue or cotton balls. ?Peri-Wound Care: Skin Prep Every Other Day/30 Days ?Discharge Instructions: Use skin prep as directed ?Peri-Wound Care: Sween Lotion (Moisturizing lotion) Every Other Day/30 Days ?Discharge Instructions: Apply moisturizing lotion as directed ?Topical: triple antibiotic ointment Every Other Day/30 Days ?Discharge Instructions: apply directly to area. ?Prim Dressing: Hydrofera Blue Ready Foam, 4x5 in Every Other Day/30 Days ?ary ?Discharge Instructions: Apply to wound bed over triple antibiotic ointment. ?Secondary Dressing: Zetuvit Plus Silicone Border Dressing 5x5 (in/in) Every Other Day/30 Days ?Discharge Instructions: Apply silicone border over primary dressing as directed. ?Electronic Signature(s) ?Signed: 01/29/2022 9:05:19 AM By:  Kalman Shan DO ?Previous Signature: 01/28/2022 5:13:01 PM Version By: Deon Pilling RN, BSN ?Entered By: Kalman Shan on 01/29/2022 09:02:50 ?-------------------------------------------------------

## 2022-01-31 NOTE — Progress Notes (Signed)
RUHEE, ENCK ([id number]) ?Visit Report for 01/28/2022 ?Arrival Information Details ?Patient Name: Date of Service: ?[name], [place] B. 01/28/2022 11:15 A M ?Medical Record Number: [id number] ?Patient Account Number: [id number] ?Date of Birth/Sex: Treating RN: ?[birth year] (61 y.o. F) Deaton, Bobbi ?Primary Care Destini Cambre: Bing Matter Other Clinician: ?Referring Yates Weisgerber: ?Treating Xaria Judon/Extender: Kalman Shan ?Bing Matter ?Weeks in Treatment: 4 ?Visit Information History Since Last Visit ?Added or deleted any medications: No ?Patient Arrived: Wheel Chair ?Any new allergies or adverse reactions: No ?Arrival Time: 11:15 ?Had a fall or experienced change in No ?Accompanied By: husband ?activities of daily living that may affect ?Transfer Assistance: None ?risk of falls: ?Patient Identification Verified: Yes ?Signs or symptoms of abuse/neglect since last No ?Secondary Verification Process Completed: Yes visito ?Patient Requires Transmission-Based Precautions: No ?Hospitalized since last visit: No ?Patient Has Alerts: Yes ?Implantable device outside of the clinic No ?Patient Alerts: Patient on Blood Thinner excluding ?cellular tissue based products placed in the ?center ?since last visit: ?Has Dressing in Place as Prescribed: Yes ?Has Compression in Place as Prescribed: Yes ?Has Footwear/Offloading in Place as Yes ?Prescribed: ?Left: Removable Cast Walker/Walking Boot ?Pain Present Now: No ?Electronic Signature(s) ?Signed: 01/28/2022 5:13:01 PM By: Deon Pilling RN, BSN ?Entered By: Deon Pilling on 01/28/2022 11:25:32 ?-------------------------------------------------------------------------------- ?Encounter Discharge Information Details ?Patient Name: Date of Service: ?[name], [place] B. 01/28/2022 11:15 A M ?Medical Record Number: [id number] ?Patient Account Number: [id number] ?Date of Birth/Sex: Treating RN: ?Oct 23, 1960 (61 y.o. F) Deaton, Bobbi ?Primary Care Idell Hissong: Bing Matter Other  Clinician: ?Referring Cordera Stineman: ?Treating Areal Cochrane/Extender: Kalman Shan ?Bing Matter ?Weeks in Treatment: 4 ?Encounter Discharge Information Items Post Procedure Vitals ?Discharge Condition: Stable ?Temperature (F): 98.4 ?Ambulatory Status: Wheelchair ?Pulse (bpm): 59 ?Discharge Destination: Home ?Respiratory Rate (breaths/min): 20 ?Transportation: Private Auto ?Blood Pressure (mmHg): 95/66 ?Accompanied By: spouse ?Schedule Follow-up Appointment: Yes ?Clinical Summary of Care: ?Electronic Signature(s) ?Signed: 01/28/2022 5:13:01 PM By: Deon Pilling RN, BSN ?Entered By: Deon Pilling on 01/28/2022 11:48:32 ?-------------------------------------------------------------------------------- ?Lower Extremity Assessment Details ?Patient Name: ?Date of Service: ?[name], [place] NCY B. 01/28/2022 11:15 A M ?Medical Record Number: [id number] ?Patient Account Number: [id number] ?Date of Birth/Sex: ?Treating RN: ?Feb 13, 1961 (61 y.o. F) Deaton, Bobbi ?Primary Care Kamoni Depree: Bing Matter ?Other Clinician: ?Referring Nikos Anglemyer: ?Treating Brooksie Ellwanger/Extender: Kalman Shan ?Bing Matter ?Weeks in Treatment: 4 ?Edema Assessment ?Assessed: [Left: Yes] [Right: No] ?Edema: [Left: N] [Right: o] ?Calf ?Left: Right: ?Point of Measurement: 33 cm From Medial Instep 28 cm ?Ankle ?Left: Right: ?Point of Measurement: 12 cm From Medial Instep 19 cm ?Vascular Assessment ?Pulses: ?Dorsalis Pedis ?Palpable: [Left:Yes] ?Electronic Signature(s) ?Signed: 01/28/2022 5:13:01 PM By: Deon Pilling RN, BSN ?Entered By: Deon Pilling on 01/28/2022 11:26:21 ?-------------------------------------------------------------------------------- ?Multi Wound Chart Details ?Patient Name: ?Date of Service: ?[name], [place] NCY B. 01/28/2022 11:15 A M ?Medical Record Number: [id number] ?Patient Account Number: [id number] ?Date of Birth/Sex: ?Treating RN: ?12/25/1960 (61 y.o. F) Deaton, Bobbi ?Primary Care Mellony Danziger: Bing Matter ?Other Clinician: ?Referring  Jamail Cullers: ?Treating Sayde Lish/Extender: Kalman Shan ?Bing Matter ?Weeks in Treatment: 4 ?Vital Signs ?Height(in): 69 ?Pulse(bpm): 59 ?Weight(lbs): 250 ?Blood Pressure(mmHg): 95/66 ?Body Mass Index(BMI): 36.9 ?Temperature(??F): 98.4 ?Respiratory Rate(breaths/min): 20 ?Photos: [1:Left, Anterior Ankle] [N/A:N/A N/A] ?Wound Location: [1:Surgical Injury] [N/A:N/A] ?Wounding Event: [1:Open Surgical Wound] [N/A:N/A] ?Primary Etiology: [1:Arrhythmia, Cirrhosis , Osteoarthritis] [N/A:N/A] ?Comorbid History: [1:10/25/2021] [N/A:N/A] ?Date Acquired: [1:4] [N/A:N/A] ?Weeks of Treatment: [1:Open] [N/A:N/A] ?Wound Status: [1:No] [N/A:N/A] ?Wound Recurrence: [1:0.2x0.2x0.1] [N/A:N/A] ?Measurements L x W x D (cm) [1:0.031] [N/A:N/A] ?A (cm?) : ?rea [1:0.003] [N/A:N/A] ?Volume (cm?) : [1:99.60%] [  N/A:N/A] ?% Reduction in A rea: [1:99.60%] [N/A:N/A] ?% Reduction in Volume: [1:Unclassifiable] [N/A:N/A] ?Classification: [1:None Present] [N/A:N/A] ?Exudate A mount: [1:Distinct, outline attached] [N/A:N/A] ?Wound Margin: [1:N/A] [N/A:N/A] ?Necrotic A mount: [1:Eschar] [N/A:N/A] ?Necrotic Tissue: ?[1:Fascia: No] [N/A:N/A] ?Exposed Structures: ?[1:Fat Layer (Subcutaneous Tissue): No Tendon: No Muscle: No Joint: No Bone: No Large (67-100%)] [N/A:N/A] ?Epithelialization: [1:Debridement - Excisional] [N/A:N/A] ?Debridement: ?Pre-procedure Verification/Time Out 11:35 [N/A:N/A] ?Taken: [1:Lidocaine 4% Topical Solution] [N/A:N/A] ?Pain Control: [1:Necrotic/Eschar, Subcutaneous,] [N/A:N/A] ?Tissue Debrided: [1:Slough Skin/Subcutaneous Tissue] [N/A:N/A] ?Level: [1:1] [N/A:N/A] ?Debridement A (sq cm): [1:rea Curette] [N/A:N/A] ?Instrument: [1:Minimum] [N/A:N/A] ?Bleeding: [1:Pressure] [N/A:N/A] ?Hemostasis Achieved: [1:0] [N/A:N/A] ?Procedural Pain: [1:0] [N/A:N/A] ?Post Procedural Pain: ?Debridement Treatment Response: Procedure was tolerated well [N/A:N/A] ?Post Debridement Measurements L x 0.2x0.2x0.1 [N/A:N/A] ?W x D (cm) [1:0.003]  [N/A:N/A] ?Post Debridement Volume: (cm?) [1:Debridement] [N/A:N/A] ?Treatment Notes ?Wound #1 (Ankle) Wound Laterality: Left, Anterior ?Cleanser ?Soap and Water ?Discharge Instruction: May shower and wash wound with dial antibacterial soap and water prior to dressing change. ?Wound Cleanser ?Discharge Instruction: Cleanse the wound with wound cleanser prior to applying a clean dressing using gauze sponges, not tissue or cotton balls. ?Peri-Wound Care ?Skin Prep ?Discharge Instruction: Use skin prep as directed ?Sween Lotion (Moisturizing lotion) ?Discharge Instruction: Apply moisturizing lotion as directed ?Topical ?triple antibiotic ointment ?Discharge Instruction: apply directly to area. ?Primary Dressing ?Hydrofera Blue Ready Foam, 4x5 in ?Discharge Instruction: Apply to wound bed over triple antibiotic ointment. ?Secondary Dressing ?Zetuvit Plus Silicone Border Dressing 5x5 (in/in) ?Discharge Instruction: Apply silicone border over primary dressing as directed. ?Secured With ?Compression Wrap ?Compression Stockings ?Add-Ons ?Electronic Signature(s) ?Signed: 01/29/2022 9:05:19 AM By: Kalman Shan DO ?Signed: 01/29/2022 3:21:12 PM By: Deon Pilling RN, BSN ?Entered By: Kalman Shan on 01/29/2022 08:59:09 ?-------------------------------------------------------------------------------- ?Multi-Disciplinary Care Plan Details ?Patient Name: ?Date of Service: ?[name], [place] NCY B. 01/28/2022 11:15 A M ?Medical Record Number: [id number] ?Patient Account Number: [id number] ?Date of Birth/Sex: ?Treating RN: ?30-Oct-1960 (61 y.o. F) Deaton, Bobbi ?Primary Care Chanley Mcenery: Bing Matter ?Other Clinician: ?Referring Anina Schnake: ?Treating Zacharie Portner/Extender: Kalman Shan ?Bing Matter ?Weeks in Treatment: 4 ?Active Inactive ?Wound/Skin Impairment ?Nursing Diagnoses: ?Knowledge deficit related to ulceration/compromised skin integrity ?Goals: ?Patient/caregiver will verbalize understanding of skin care regimen ?Date  Initiated: 01/09/2022 ?Target Resolution Date: 03/21/2022 ?Goal Status: Active ?Interventions: ?Assess patient/caregiver ability to perform ulcer/skin care regimen upon admission and as needed ?Assess ulceration(s) every visit ?Provide educat

## 2022-02-03 ENCOUNTER — Encounter: Payer: Self-pay | Admitting: Cardiology

## 2022-02-03 ENCOUNTER — Telehealth (INDEPENDENT_AMBULATORY_CARE_PROVIDER_SITE_OTHER): Payer: BC Managed Care – PPO | Admitting: Cardiology

## 2022-02-03 VITALS — BP 100/58 | HR 68

## 2022-02-03 DIAGNOSIS — D6869 Other thrombophilia: Secondary | ICD-10-CM

## 2022-02-03 DIAGNOSIS — I4819 Other persistent atrial fibrillation: Secondary | ICD-10-CM

## 2022-02-03 NOTE — Progress Notes (Signed)
?  ? ?Electrophysiology TeleHealth Note ? ? ?Due to national recommendations of social distancing due to [place] 19, an audio/video telehealth visit is felt to be most appropriate for this patient at this time.  See Epic message for the patient's consent to telehealth for [facility]. ? ? ?Date:  02/03/2022  ? ?ID:  [name], DOB [birth year], MRN [id number]  Location: patient's home ? ?Provider location: [address] [place] ? ?Evaluation Performed: Follow-up visit ? ?PCP:  Aletha Halim., PA-C  ?Cardiologist:  Jenkins Rouge, MD  ?Electrophysiologist:  Dr Curt Bears ? ?Chief Complaint:  AF ? ?History of Present Illness:   ? ?[name] is a 61 y.o. female who presents via audio/video conferencing for a telehealth visit today.  Since last being seen in our clinic, the patient reports doing very well.  Today, she denies symptoms of palpitations, chest pain, shortness of breath,  lower extremity edema, dizziness, presyncope, or syncope.  The patient is otherwise without complaint today.  The patient denies symptoms of fevers, chills, cough, or new SOB worrisome for COVID 19. ? ?She has a history significant for alcohol abuse, cirrhosis, B12 deficiency, aortic atherosclerosis, coronary artery disease by CT, COPD, hypothyroidism, hyperlipidemia.  She presented to the hospital August 2019 with a brown out episode.  She was found to have heart rates in the 40s as well as atrial fibrillation in the 120s.  She is status post Adrian dual-chamber pacemaker implanted 07/28/2018.  She is status post atrial fibrillation ablation 09/24/2020. ? ?Today, denies symptoms of palpitations, chest pain, shortness of breath, orthopnea, PND, lower extremity edema, claudication, dizziness, presyncope, syncope, bleeding, or neurologic sequela. The patient is tolerating medications without difficulties.  She recently had ankle replacement surgery and reconstruction of the arch in her foot.  She is currently  nonweightbearing.  She is unaware of arrhythmias.  She is happy with her cardiac control. ? ?Past Medical History:  ?Diagnosis Date  ? Alcohol abuse   ? a. quit several years ago.  ? Allergy   ? Anemia   ? younger  ? Arthritis   ? feet,knees  ? B12 deficiency   ? BPPV (benign paroxysmal positional vertigo)   ? Cirrhosis ([place])   ? Complication of anesthesia   ? "slow to wake"  ? Constipation   ? on movantik- stools regular and soft on this  ? COPD (chronic obstructive pulmonary disease) ([place])   ? Foot drop   ? GERD (gastroesophageal reflux disease)   ? Hyperlipidemia   ? Hypothyroidism   ? Neuromuscular disorder ([place])   ? idiopathic neuropathy  ? 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 pacemaker insertion for tachy/brady syndrome  ? Spinal cord stimulator status   ? Spinal stenosis   ? Tobacco abuse   ? ? ?Past Surgical History:  ?Procedure Laterality Date  ? ANKLE SURGERY    ? ATRIAL FIBRILLATION ABLATION N/A 10/02/2020  ? Procedure: ATRIAL FIBRILLATION ABLATION;  Surgeon: Constance Haw, MD;  Location: [facility];  Service: Cardiovascular;  Laterality: N/A;  ? CERVICAL CONE BIOPSY    ? CESAREAN SECTION    ? COLONOSCOPY    ? HERNIA REPAIR    ? KNEE SURGERY    ? x5  ? ORIF ANKLE FRACTURE Left 06/02/2018  ? Procedure: OPEN REDUCTION INTERNAL FIXATION (ORIF) ANKLE FRACTURE;  Surgeon: Wylene Simmer, MD;  Location: [facility];  Service: Orthopedics;  Laterality: Left;  ? ORIF ANKLE FRACTURE Left 08/26/2018  ?  Procedure: Left ankle syndesmosis OPEN REDUCTION INTERNAL FIXATION (ORIF)/ reconstruction of deltoid ligament;  Surgeon: Wylene Simmer, MD;  Location: [facility];  Service: Orthopedics;  Laterality: Left;  ? PACEMAKER IMPLANT N/A 08/05/2018  ? Procedure: PACEMAKER IMPLANT;  Surgeon: Constance Haw, MD;  Location: [facility];  Service: Cardiovascular;  Laterality: N/A;  ? POLYPECTOMY    ? SPINAL CORD STIMULATOR INSERTION N/A 05/07/2016  ? Procedure: LUMBAR SPINAL CORD  STIMULATOR INSERTION;  Surgeon: Melina Schools, MD;  Location: [facility];  Service: Orthopedics;  Laterality: N/A;  ? ? ?Current Outpatient Medications  ?Medication Sig Dispense Refill  ? albuterol (PROVENTIL HFA;VENTOLIN HFA) 108 (90 Base) MCG/ACT inhaler Inhale 2 puffs into the lungs every 6 (six) hours as needed for wheezing or shortness of breath.     ? apixaban (ELIQUIS) 5 MG TABS tablet Take 1 tablet (5 mg total) by mouth 2 (two) times daily. Please call the office for an appointment with Cardiologist. Thanks 180 tablet 0  ? B Complex Vitamins (B COMPLEX PO) Take 1 tablet by mouth daily.    ? BREO ELLIPTA 200-25 MCG/INH AEPB Inhale 1 puff into the lungs daily.  11  ? Coenzyme Q10 (CO Q 10) 100 MG CAPS Take 100 mg by mouth at bedtime.    ? famotidine (PEPCID) 20 MG tablet Take 20 mg by mouth 2 (two) times daily. New Zantac 360??  0  ? folic acid (FOLVITE) 1 MG tablet Take 1 mg by mouth daily.     ? hydrochlorothiazide (HYDRODIURIL) 25 MG tablet Take 25 mg by mouth daily. (Patient not taking: Reported on 01/14/2022)  1  ? HYDROmorphone (DILAUDID) 4 MG tablet Take 4 mg by mouth 5 (five) times daily as needed for severe pain.    ? levothyroxine (SYNTHROID) 25 MCG tablet Take 25 mcg by mouth daily before breakfast.     ? lisinopril (ZESTRIL) 2.5 MG tablet TAKE 1 TABLET BY MOUTH EVERY DAY 90 tablet 3  ? magnesium gluconate (MAGONATE) 500 MG tablet Take 500 mg by mouth at bedtime.    ? metaxalone (SKELAXIN) 800 MG tablet Take 800 mg by mouth 3 (three) times daily.    ? metoprolol succinate (TOPROL-XL) 100 MG 24 hr tablet Take 1 tablet (100 mg total) by mouth daily. Take with or immediately following a meal. 90 tablet 3  ? metoprolol succinate (TOPROL-XL) 50 MG 24 hr tablet Take 1 tablet (50 mg total) by mouth daily. Take with your 100 mg tablet to total 150 mg daily. Take with or immediately following a meal. 90 tablet 3  ? Olopatadine HCl (PATADAY) 0.7 % SOLN Place 1 drop into both eyes daily.    ? pregabalin (LYRICA) 150  MG capsule Take 150 mg by mouth 2 (two) times daily. Additional 150 mg in the middle of the day if needed    ? RESTASIS MULTIDOSE 0.05 % ophthalmic emulsion Place 1 drop into both eyes every evening.    ? Rosuvastatin Calcium 20 MG CPSP Take 20 mg by mouth daily.    ? senna (SENOKOT) 8.6 MG TABS tablet Take 2 tablets by mouth at bedtime.    ? tiZANidine (ZANAFLEX) 4 MG tablet Take 8 mg by mouth at bedtime.    ? Vitamin D, Ergocalciferol, (DRISDOL) 50000 units CAPS capsule Take 50,000 Units by mouth every Wednesday.     ? ?No current facility-administered medications for this visit.  ? ? ?Allergies:   Short ragweed pollen ext, Sulfa antibiotics, Sulfamethoxazole, Vancomycin,  and Azithromycin  ? ?Social History:  The patient  reports that she has been smoking cigarettes. She has a 30.00 pack-year smoking history. She has never used smokeless tobacco. She reports that she does not currently use alcohol. She reports that she does not use drugs.  ? ?Family History:  The patient's  She was adopted. Family history is unknown by patient.  ? ?ROS:  Please see the history of present illness.   All other systems are personally reviewed and negative.  ? ? ?Exam:   ? ?Vital Signs:  BP (!) 100/58   Pulse 68   LMP  (LMP Unknown)  ? ?Well appearing, alert and conversant, regular work of breathing,  good skin color ?Eyes- anicteric, neuro- grossly intact, skin- no apparent rash or lesions or cyanosis, mouth- oral mucosa is pink ? ? ?Labs/Other Tests and Data Reviewed:   ? ?Recent Labs: ?No results found for requested labs within last 8760 hours.  ? ?Wt Readings from Last 3 Encounters:  ?03/18/21 259 lb (117.5 kg)  ?12/31/20 252 lb (114.3 kg)  ?11/01/20 254 lb 3.2 oz (115.3 kg)  ?  ? ?Other studies personally reviewed: ? ?Last device remote is reviewed from [facility] PDF dated 11/11/21 which reveals normal device function, no arrhythmias  ? ? ?ASSESSMENT & PLAN:   ? ?1.  Paroxysmal atrial fibrillation/flutter: Currently on Eliquis and  Toprol-XL.  CHA2DS2-VASc of 2.  Status post ablation 09/24/2020.  Minimal atrial fibrillation since ablation. ? ?2.  Chronic systolic heart failure: Ejection fraction 45 to 50%.  No obvious volume overload. ? ?3.

## 2022-02-10 ENCOUNTER — Ambulatory Visit (INDEPENDENT_AMBULATORY_CARE_PROVIDER_SITE_OTHER): Payer: BC Managed Care – PPO

## 2022-02-10 DIAGNOSIS — I495 Sick sinus syndrome: Secondary | ICD-10-CM

## 2022-02-10 LAB — CUP PACEART REMOTE DEVICE CHECK
Battery Remaining Longevity: 90 mo
Battery Remaining Percentage: 70 %
Battery Voltage: 2.99 V
Brady Statistic AP VP Percent: 1.2 %
Brady Statistic AP VS Percent: 69 %
Brady Statistic AS VP Percent: 1 %
Brady Statistic AS VS Percent: 29 %
Brady Statistic RA Percent Paced: 68 %
Brady Statistic RV Percent Paced: 1.3 %
Date Time Interrogation Session: 20230508020013
Implantable Lead Implant Date: 20191031
Implantable Lead Implant Date: 20191031
Implantable Lead Location: 753859
Implantable Lead Location: 753860
Implantable Pulse Generator Implant Date: 20191031
Lead Channel Impedance Value: 530 Ohm
Lead Channel Impedance Value: 610 Ohm
Lead Channel Pacing Threshold Amplitude: 0.5 V
Lead Channel Pacing Threshold Amplitude: 0.5 V
Lead Channel Pacing Threshold Pulse Width: 0.5 ms
Lead Channel Pacing Threshold Pulse Width: 0.5 ms
Lead Channel Sensing Intrinsic Amplitude: 12 mV
Lead Channel Sensing Intrinsic Amplitude: 4.2 mV
Lead Channel Setting Pacing Amplitude: 0.75 V
Lead Channel Setting Pacing Amplitude: 1.5 V
Lead Channel Setting Pacing Pulse Width: 0.5 ms
Lead Channel Setting Sensing Sensitivity: 2 mV
Pulse Gen Model: 2272
Pulse Gen Serial Number: 9079034

## 2022-02-11 ENCOUNTER — Encounter (HOSPITAL_BASED_OUTPATIENT_CLINIC_OR_DEPARTMENT_OTHER): Payer: BC Managed Care – PPO | Attending: Internal Medicine | Admitting: Internal Medicine

## 2022-02-11 DIAGNOSIS — F17210 Nicotine dependence, cigarettes, uncomplicated: Secondary | ICD-10-CM | POA: Insufficient documentation

## 2022-02-11 DIAGNOSIS — L89520 Pressure ulcer of left ankle, unstageable: Secondary | ICD-10-CM | POA: Insufficient documentation

## 2022-02-11 DIAGNOSIS — X58XXXA Exposure to other specified factors, initial encounter: Secondary | ICD-10-CM | POA: Insufficient documentation

## 2022-02-11 DIAGNOSIS — Z96662 Presence of left artificial ankle joint: Secondary | ICD-10-CM | POA: Diagnosis not present

## 2022-02-11 DIAGNOSIS — G609 Hereditary and idiopathic neuropathy, unspecified: Secondary | ICD-10-CM | POA: Diagnosis not present

## 2022-02-11 DIAGNOSIS — T8131XA Disruption of external operation (surgical) wound, not elsewhere classified, initial encounter: Secondary | ICD-10-CM | POA: Diagnosis present

## 2022-02-11 DIAGNOSIS — L97522 Non-pressure chronic ulcer of other part of left foot with fat layer exposed: Secondary | ICD-10-CM | POA: Insufficient documentation

## 2022-02-11 NOTE — Progress Notes (Signed)
[name] ([id number]) ?Visit Report for 02/11/2022 ?Chief Complaint Document Details ?Patient Name: Date of Service: ?[name], Tracy NCY B. 02/11/2022 11:15 A M ?Medical Record Number: [id number] ?Patient Account Number: [id number] ?Date of Birth/Sex: Treating RN: ?[birth year] (61 y.o. F) [name], [name] ?Primary Care Provider: Bing Matter Other Clinician: ?Referring Provider: ?Treating Provider/Extender: Kalman Shan ?Bing Matter ?Weeks in Treatment: 6 ?Information Obtained from: Patient ?Chief Complaint ?Left foot wounds ?Electronic Signature(s) ?Signed: 02/11/2022 12:54:45 PM By: Kalman Shan DO ?Entered By: Kalman Shan on 02/11/2022 12:35:38 ?-------------------------------------------------------------------------------- ?HPI Details ?Patient Name: Date of Service: ?[name], Tracy NCY B. 02/11/2022 11:15 A M ?Medical Record Number: [id number] ?Patient Account Number: [id number] ?Date of Birth/Sex: Treating RN: ?[birth year] (60 y.o. F) [name], [name] ?Primary Care Provider: Bing Matter Other Clinician: ?Referring Provider: ?Treating Provider/Extender: Kalman Shan ?Bing Matter ?Weeks in Treatment: 6 ?History of Present Illness ?HPI Description: 12/27/2021 ?[name] is a 61 year old female with a past medical history of hereditary and idiopathic peripheral neuropathy, and left total ankle replacement that ?presents to the clinic for a 34-monthhistory of wounds to her left foot. 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 She has been placed in ?a short leg cast for partial weightbearing. She states this is replaced every 2 weeks. She reports a sore spot to the left medial aspect of the ankle. She also ?has an incision along the dorsal aspect of the left foot with sutures in place and dried necrotic debris. She does not do wound care for the sites because she is ?always in a cast. She was referred to our clinic for discussion of hyperbaric oxygen treatment. It was  discussed Prior to her appointment that she did not have ?an indication for hyperbaric oxygen treatment For insurance approval. She states she still wanted to continue her appointment in our clinic for potential wound ?care. She currently denies signs of infection. ?4/6; patient presents for follow-up. She discussed her case with her surgeon and for now she has been taken out of the short cast. We can proceed with wound ?care. She has been doing Hydrofera Blue to the medial left ankle wound and vaseline to the suture site. She currently denies signs of infection. ?4/26; patient presents for follow-up. She has been using Hydrofera Blue to the anterior left foot wound. She denies signs of infection. She has no issues or ?complaints today. She just started using a walking boot. ?5/9: Patient presents for follow-up. She has been using Hydrofera Blue and antibiotic ointment to the wound bed. She reports improvement in wound healing. ?She has no issues or complaints today. ?Electronic Signature(s) ?Signed: 02/11/2022 12:54:45 PM By: HKalman ShanDO ?Entered By: HKalman Shanon 02/11/2022 12:36:19 ?-------------------------------------------------------------------------------- ?Physical Exam Details ?Patient Name: Date of Service: ?[name] 02/11/2022 11:15 A [place] ?Medical Record Number: [id number]?Patient Account Number: [id number]?Date of Birth/Sex: Treating RN: ?[id number](61y.o. F) [name], [name] ?Primary Care Provider: KBing MatterOther Clinician: ?Referring Provider: ?Treating Provider/Extender: HKalman Shan?KBing Matter?Weeks in Treatment: 6 ?Constitutional ?respirations regular, non-labored and within target range for patient..Marland Kitchen?Cardiovascular ?2+ dorsalis pedis/posterior tibialis pulses. ?Psychiatric ?pleasant and cooperative. ?Notes ?Left lower extremity: T the previous wound site there is epithelization. Surrounding skin is intact. ?o ?Electronic Signature(s) ?Signed: 02/11/2022 12:54:45  PM By: HKalman ShanDO ?Entered By: HKalman Shanon 02/11/2022 12:36:57 ?-------------------------------------------------------------------------------- ?Physician Orders Details ?Patient Name: Date of Service: ?[name] 02/11/2022 11:15 A M ?Medical Record Number: [id number]?Patient Account  Number: [id number] ?Date of Birth/Sex: Treating RN: ?[birth year] (61 y.o. F) [name], [name] ?Primary Care Provider: Bing Matter Other Clinician: ?Referring Provider: ?Treating Provider/Extender: Kalman Shan ?Bing Matter ?Weeks in Treatment: 6 ?Verbal / Phone Orders: No ?Diagnosis Coding ?Discharge From [facility] Services ?Discharge from [place] - Call if any future wound care needs. Continue to follow closely with your orthopedics. ?protect the area with vaseline or lotion and bordered foam x1week. ?Edema Control - Lymphedema / SCD / Other ?Moisturize legs daily. - both legs every night before bed. ?Electronic Signature(s) ?Signed: 02/11/2022 12:54:45 PM By: Kalman Shan DO ?Entered By: Kalman Shan on 02/11/2022 12:44:03 ?-------------------------------------------------------------------------------- ?Problem List Details ?Patient Name: Date of Service: ?[name], Tracy NCY B. 02/11/2022 11:15 A M ?Medical Record Number: [id number] ?Patient Account Number: [id number] ?Date of Birth/Sex: Treating RN: ?[birth year] (61 y.o. 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 02/11/2022 11:15 A M ?Medical Record Number: [id number] ?Patient Account Number: [id number] ?Date of Birth/Sex: Treating RN: ?1961-06-06 (61 y.o. F) [name], [name] ?Primary Care Provider: Bing Matter Other Clinician: ?Referring Provider: ?Treating Provider/Extender: Kalman Shan ?Bing Matter ?Weeks in Treatment: 6 ?Subjective ?Chief Complaint ?Information obtained from Patient ?Left foot wounds ?History of Present Illness (HPI) ?12/27/2021 ?[name] is a 61 year old female with a past medical history of hereditary and idiopathic peripheral neuropathy, and left total ankle replacement that ?presents to the clinic for a 33-monthhistory of wounds to her left foot. 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 She has been placed in ?a short leg cast for partial weightbearing. She states this is replaced every 2 weeks. She reports a sore spot to the left medial aspect of the ankle. She also ?has an incision along the dorsal aspect of the left foot with sutures in place and dried necrotic debris. She does not do wound care for the sites because she is ?always in a cast. She was referred to our clinic for discussion of hyperbaric oxygen treatment. It was discussed Prior to her appointment that she did not have ?an indication for hyperbaric oxygen treatment For insurance approval. She states she still wanted to continue her appointment in our clinic for potential wound ?care. She currently denies signs of infection. ?4/6; patient presents for follow-up. She discussed her case with her surgeon and for now she has been taken out of the  short cast. We can proceed with wound ?care. She has been doing Hydrofera Blue to the medial left ankle wound and vaseline to the suture site. She currently denies signs of infection. ?4/26; patient presents for foll

## 2022-02-12 ENCOUNTER — Encounter: Payer: Self-pay | Admitting: Cardiology

## 2022-02-26 NOTE — Progress Notes (Signed)
Remote pacemaker transmission.   

## 2022-03-03 ENCOUNTER — Other Ambulatory Visit: Payer: Self-pay | Admitting: Cardiology

## 2022-03-06 ENCOUNTER — Encounter: Payer: Self-pay | Admitting: Physician Assistant

## 2022-03-06 ENCOUNTER — Ambulatory Visit (INDEPENDENT_AMBULATORY_CARE_PROVIDER_SITE_OTHER): Payer: BC Managed Care – PPO | Admitting: Physician Assistant

## 2022-03-06 ENCOUNTER — Ambulatory Visit: Payer: BC Managed Care – PPO | Admitting: Physician Assistant

## 2022-03-06 DIAGNOSIS — L820 Inflamed seborrheic keratosis: Secondary | ICD-10-CM | POA: Diagnosis not present

## 2022-03-06 MED ORDER — METOPROLOL SUCCINATE ER 100 MG PO TB24: 100.0000 mg | ORAL_TABLET | Freq: Every day | ORAL | 3 refills | Status: DC

## 2022-03-06 NOTE — Progress Notes (Signed)
   Follow-Up Visit   Subjective  [name] is a 61 y.o. female who presents for the following: Skin Problem (New lesion above left eyebrow x 3 months- no itch or bleed,  left thigh- dark lesion & back- no itch or bleed).   The following portions of the chart were reviewed this encounter and updated as appropriate:  Tobacco  Allergies  Meds  Problems  Med Hx  Surg Hx  Fam Hx      Objective  Well appearing patient in no apparent distress; mood and affect are within normal limits.  All skin waist up examined.  Left Eyebrow, Left Thigh - Anterior, Mid Back Stuck-on, plaques on an erythematous base.     Assessment & Plan  Seborrheic keratosis, inflamed (3) Left Thigh - Anterior; Mid Back; Left Eyebrow  Destruction of lesion - Left Eyebrow, Left Thigh - Anterior, Mid Back Complexity: simple   Destruction method: cryotherapy   Informed consent: discussed and consent obtained   Timeout:  patient name, date of birth, surgical site, and procedure verified Lesion destroyed using liquid nitrogen: Yes   Cryotherapy cycles:  3 Outcome: patient tolerated procedure well with no complications      I, Kelina Beauchamp, PA-C, have reviewed all documentation's for this visit.  The documentation on 03/06/22 for the exam, diagnosis, procedures and orders are all accurate and complete.

## 2022-03-08 ENCOUNTER — Encounter: Payer: Self-pay | Admitting: Physician Assistant

## 2022-03-11 NOTE — Telephone Encounter (Signed)
What RX see patient note

## 2022-03-12 ENCOUNTER — Encounter: Payer: Self-pay | Admitting: Physician Assistant

## 2022-03-13 ENCOUNTER — Encounter: Payer: Self-pay | Admitting: Cardiology

## 2022-03-18 NOTE — Progress Notes (Signed)
CHANI, GHANEM ([id number]) Visit Report for 02/11/2022 Arrival Information Details Patient Name: Date of Service: [name] 02/11/2022 11:15 A M Medical Record Number: [id number] Patient Account Number: [id number] Date of Birth/Sex: Treating RN: Jan 18, 1961 (61 y.o. Helene Shoe, Tammi Klippel Primary Care Wafa Martes: Bing Matter Other Clinician: Referring Rashaunda Rahl: Treating Jarron Curley/Extender: Sabino Dick in Treatment: 6 Visit Information History Since Last Visit Added or deleted any medications: No Patient Arrived: Wheel Chair Any new allergies or adverse reactions: No Arrival Time: 11:10 Had a fall or experienced change in No Accompanied By: husband activities of daily living that may affect Transfer Assistance: None risk of falls: Patient Requires Transmission-Based Precautions: No Signs or symptoms of abuse/neglect since last visito No Patient Has Alerts: Yes Hospitalized since last visit: No Patient Alerts: Patient on Blood Thinner Implantable device outside of the clinic excluding No cellular tissue based products placed in the center since last visit: Has Dressing in Place as Prescribed: Yes Pain Present Now: Yes Electronic Signature(s) Signed: 03/18/2022 8:46:54 AM By: Erenest Blank Entered By: Erenest Blank on 02/11/2022 11:14:49 -------------------------------------------------------------------------------- Clinic Level of Care Assessment Details Patient Name: Date of Service: [name] 02/11/2022 11:15 A M Medical Record Number: [id number] Patient Account Number: [id number] Date of Birth/Sex: Treating RN: April 20, 1961 (61 y.o. Helene Shoe, Tammi Klippel Primary Care Allice Garro: Bing Matter Other Clinician: Referring Hashir Deleeuw: Treating Levonne Carreras/Extender: Sabino Dick in Treatment: 6 Clinic Level of Care Assessment Items TOOL 4 Quantity Score X- 1 0 Use when only an EandM is performed on FOLLOW-UP  visit ASSESSMENTS - Nursing Assessment / Reassessment X- 1 10 Reassessment of Co-morbidities (includes updates in patient status) X- 1 5 Reassessment of Adherence to Treatment Plan ASSESSMENTS - Wound and Skin A ssessment / Reassessment X - Simple Wound Assessment / Reassessment - one wound 1 5 '[]'$  - 0 Complex Wound Assessment / Reassessment - multiple wounds X- 1 10 Dermatologic / Skin Assessment (not related to wound area) ASSESSMENTS - Focused Assessment X- 1 5 Circumferential Edema Measurements - multi extremities '[]'$  - 0 Nutritional Assessment / Counseling / Intervention '[]'$  - 0 Lower Extremity Assessment (monofilament, tuning fork, pulses) '[]'$  - 0 Peripheral Arterial Disease Assessment (using hand held doppler) ASSESSMENTS - Ostomy and/or Continence Assessment and Care '[]'$  - 0 Incontinence Assessment and Management '[]'$  - 0 Ostomy Care Assessment and Management (repouching, etc.) PROCESS - Coordination of Care X - Simple Patient / Family Education for ongoing care 1 15 '[]'$  - 0 Complex (extensive) Patient / Family Education for ongoing care X- 1 10 Staff obtains Programmer, systems, Records, T Results / Process Orders est '[]'$  - 0 Staff telephones HHA, Nursing Homes / Clarify orders / etc '[]'$  - 0 Routine Transfer to another Facility (non-emergent condition) '[]'$  - 0 Routine Hospital Admission (non-emergent condition) '[]'$  - 0 New Admissions / Biomedical engineer / Ordering NPWT Apligraf, etc. , '[]'$  - 0 Emergency Hospital Admission (emergent condition) X- 1 10 Simple Discharge Coordination '[]'$  - 0 Complex (extensive) Discharge Coordination PROCESS - Special Needs '[]'$  - 0 Pediatric / Minor Patient Management '[]'$  - 0 Isolation Patient Management '[]'$  - 0 Hearing / Language / Visual special needs '[]'$  - 0 Assessment of Community assistance (transportation, D/C planning, etc.) '[]'$  - 0 Additional assistance / Altered mentation '[]'$  - 0 Support Surface(s) Assessment (bed, cushion, seat,  etc.) INTERVENTIONS - Wound Cleansing / Measurement X - Simple Wound Cleansing - one wound 1 5 '[]'$  - 0 Complex Wound Cleansing - multiple wounds  X- 1 5 Wound Imaging (photographs - any number of wounds) '[]'$  - 0 Wound Tracing (instead of photographs) X- 1 5 Simple Wound Measurement - one wound '[]'$  - 0 Complex Wound Measurement - multiple wounds INTERVENTIONS - Wound Dressings X - Small Wound Dressing one or multiple wounds 1 10 '[]'$  - 0 Medium Wound Dressing one or multiple wounds '[]'$  - 0 Large Wound Dressing one or multiple wounds '[]'$  - 0 Application of Medications - topical '[]'$  - 0 Application of Medications - injection INTERVENTIONS - Miscellaneous '[]'$  - 0 External ear exam '[]'$  - 0 Specimen Collection (cultures, biopsies, blood, body fluids, etc.) '[]'$  - 0 Specimen(s) / Culture(s) sent or taken to Lab for analysis '[]'$  - 0 Patient Transfer (multiple staff / Civil Service fast streamer / Similar devices) '[]'$  - 0 Simple Staple / Suture removal (25 or less) '[]'$  - 0 Complex Staple / Suture removal (26 or more) '[]'$  - 0 Hypo / Hyperglycemic Management (close monitor of Blood Glucose) '[]'$  - 0 Ankle / Brachial Index (ABI) - do not check if billed separately X- 1 5 Vital Signs Has the patient been seen at the hospital within the last three years: Yes Total Score: 100 Level Of Care: New/Established - Level 3 Electronic Signature(s) Signed: 02/11/2022 5:49:10 PM By: Deon Pilling RN, BSN Entered By: Deon Pilling on 02/11/2022 11:31:51 -------------------------------------------------------------------------------- Encounter Discharge Information Details Patient Name: Date of Service: Tracy Butters B. 02/11/2022 11:15 A M Medical Record Number: [id number] Patient Account Number: [id number] Date of Birth/Sex: Treating RN: 12-21-1960 (61 y.o. Debby Bud Primary Care Kobe Jansma: Bing Matter Other Clinician: Referring Owen Pratte: Treating Maika Kaczmarek/Extender: Sabino Dick in  Treatment: 6 Encounter Discharge Information Items Discharge Condition: Stable Ambulatory Status: Wheelchair Discharge Destination: Home Transportation: Private Auto Accompanied By: husband Schedule Follow-up Appointment: No Clinical Summary of Care: Notes foam bandage applied for protection. Electronic Signature(s) Signed: 02/11/2022 5:49:10 PM By: Deon Pilling RN, BSN Entered By: Deon Pilling on 02/11/2022 11:32:31 -------------------------------------------------------------------------------- Lower Extremity Assessment Details Patient Name: Date of Service: Tracy Butters B. 02/11/2022 11:15 A M Medical Record Number: [id number] Patient Account Number: [id number] Date of Birth/Sex: Treating RN: Jan 02, 1961 (61 y.o. Debby Bud Primary Care Dorrie Cocuzza: Bing Matter Other Clinician: Referring Cregg Jutte: Treating Aika Brzoska/Extender: Truddie Hidden Weeks in Treatment: 6 Edema Assessment Assessed: [Left: No] [Right: No] Edema: [Left: N] [Right: o] Calf Left: Right: Point of Measurement: 33 cm From Medial Instep 29.9 cm Ankle Left: Right: Point of Measurement: 12 cm From Medial Instep 19.6 cm Vascular Assessment Pulses: Dorsalis Pedis Palpable: [Left:Yes] Electronic Signature(s) Signed: 02/11/2022 5:49:10 PM By: Deon Pilling RN, BSN Signed: 03/18/2022 8:46:54 AM By: Erenest Blank Entered By: Erenest Blank on 02/11/2022 11:20:15 -------------------------------------------------------------------------------- Multi Wound Chart Details Patient Name: Date of Service: Tracy Butters B. 02/11/2022 11:15 A M Medical Record Number: [id number] Patient Account Number: [id number] Date of Birth/Sex: Treating RN: [birth year] (61 y.o. Debby Bud Primary Care Vaeda Westall: Bing Matter Other Clinician: Referring Jaylie Neaves: Treating Bralon Antkowiak/Extender: Truddie Hidden Weeks in Treatment: 6 Vital Signs Height(in): 69 Pulse(bpm):  19 Weight(lbs): 250 Blood Pressure(mmHg): 85/59 Body Mass Index(BMI): 36.9 Temperature(F): 98.1 Respiratory Rate(breaths/min): 19 Photos: [N/A:N/A] Left, Anterior Ankle N/A N/A Wound Location: Surgical Injury N/A N/A Wounding Event: Open Surgical Wound N/A N/A Primary Etiology: Arrhythmia, Cirrhosis , Osteoarthritis N/A N/A Comorbid History: 10/25/2021 N/A N/A Date Acquired: 6 N/A N/A Weeks of Treatment: Healed - Epithelialized N/A N/A Wound Status: No N/A N/A Wound Recurrence: 0x0x0 N/A N/A  Measurements L x W x D (cm) 0 N/A N/A A (cm) : rea 0 N/A N/A Volume (cm) : 100.00% N/A N/A % Reduction in A rea: 100.00% N/A N/A % Reduction in Volume: Unclassifiable N/A N/A Classification: None Present N/A N/A Exudate A mount: Distinct, outline attached N/A N/A Wound Margin: None Present (0%) N/A N/A Granulation A mount: Large (67-100%) N/A N/A Necrotic A mount: Eschar N/A N/A Necrotic Tissue: Fascia: No N/A N/A Exposed Structures: Fat Layer (Subcutaneous Tissue): No Tendon: No Muscle: No Joint: No Bone: No Large (67-100%) N/A N/A Epithelialization: Treatment Notes Electronic Signature(s) Signed: 02/11/2022 12:54:45 PM By: Kalman Shan DO Signed: 02/11/2022 5:49:10 PM By: Deon Pilling RN, BSN Entered By: Kalman Shan on 02/11/2022 12:34:02 -------------------------------------------------------------------------------- Multi-Disciplinary Care Plan Details Patient Name: Date of Service: Tracy Butters B. 02/11/2022 11:15 A M Medical Record Number: [id number] Patient Account Number: [id number] Date of Birth/Sex: Treating RN: [birth year] (61 y.o. Debby Bud Primary Care Eston Heslin: Bing Matter Other Clinician: Referring Randle Shatzer: Treating Alyus Mofield/Extender: Truddie Hidden Weeks in Treatment: 6 Active Inactive Electronic Signature(s) Signed: 02/11/2022 5:49:10 PM By: Deon Pilling RN, BSN Entered By: Deon Pilling on  02/11/2022 11:30:55 -------------------------------------------------------------------------------- Pain Assessment Details Patient Name: Date of Service: Tracy Butters B. 02/11/2022 11:15 A M Medical Record Number: [id number] Patient Account Number: [id number] Date of Birth/Sex: Treating RN: 1961-01-31 (61 y.o. Debby Bud Primary Care Tyrea Froberg: Bing Matter Other Clinician: Referring Hadyn Blanck: Treating Montrice Gracey/Extender: Truddie Hidden Weeks in Treatment: 6 Active Problems Location of Pain Severity and Description of Pain Patient Has Paino Yes Site Locations Pain Location: Pain in Ulcers Duration of the Pain. Constant / Intermittento Intermittent Rate the pain. Current Pain Level: 3 Pain Management and Medication Current Pain Management: Medication: Yes Rest: Yes How does your wound impact your activities of daily livingo Sleep: Yes Drive: Yes Electronic Signature(s) Signed: 02/11/2022 5:49:10 PM By: Deon Pilling RN, BSN Signed: 03/18/2022 8:46:54 AM By: Erenest Blank Entered By: Erenest Blank on 02/11/2022 11:16:17 -------------------------------------------------------------------------------- Patient/Caregiver Education Details Patient Name: Date of Service: [name] 5/9/2023andnbsp11:15 A M Medical Record Number: [id number] Patient Account Number: [id number] Date of Birth/Gender: Treating RN: [birth year] (61 y.o. Debby Bud Primary Care Physician: Bing Matter Other Clinician: Referring Physician: Treating Physician/Extender: Sabino Dick in Treatment: 6 Education Assessment Education Provided To: Patient Education Topics Provided Wound/Skin Impairment: Handouts: Skin Care Do's and Dont's Methods: Explain/Verbal Responses: Reinforcements needed Electronic Signature(s) Signed: 02/11/2022 5:49:10 PM By: Deon Pilling RN, BSN Entered By: Deon Pilling on 02/11/2022  11:31:22 -------------------------------------------------------------------------------- Wound Assessment Details Patient Name: Date of Service: Tracy Butters B. 02/11/2022 11:15 A M Medical Record Number: [id number] Patient Account Number: [id number] Date of Birth/Sex: Treating RN: 06-Dec-1960 (61 y.o. Helene Shoe, Tammi Klippel Primary Care Nirvaan Frett: Bing Matter Other Clinician: Referring Ellington Greenslade: Treating Latrecia Capito/Extender: Truddie Hidden Weeks in Treatment: 6 Wound Status Wound Number: 1 Primary Etiology: Open Surgical Wound Wound Location: Left, Anterior Ankle Wound Status: Healed - Epithelialized Wounding Event: Surgical Injury Comorbid History: Arrhythmia, Cirrhosis , Osteoarthritis Date Acquired: 10/25/2021 Weeks Of Treatment: 6 Clustered Wound: No Photos Wound Measurements Length: (cm) Width: (cm) Depth: (cm) Area: (cm) Volume: (cm) 0 % Reduction in Area: 100% 0 % Reduction in Volume: 100% 0 Epithelialization: Large (67-100%) 0 Tunneling: No 0 Undermining: No Wound Description Classification: Unclassifiable Wound Margin: Distinct, outline attached Exudate Amount: None Present Foul Odor After Cleansing: No Slough/Fibrino No Wound Bed Granulation Amount: None Present (0%) Exposed  Structure Necrotic Amount: Large (67-100%) Fascia Exposed: No Necrotic Quality: Eschar Fat Layer (Subcutaneous Tissue) Exposed: No Tendon Exposed: No Muscle Exposed: No Joint Exposed: No Bone Exposed: No Electronic Signature(s) Signed: 02/11/2022 5:49:10 PM By: Deon Pilling RN, BSN Entered By: Deon Pilling on 02/11/2022 11:28:56 -------------------------------------------------------------------------------- Vitals Details Patient Name: Date of Service: Tracy Butters B. 02/11/2022 11:15 A M Medical Record Number: [id number] Patient Account Number: [id number] Date of Birth/Sex: Treating RN: 27-Jun-1961 (61 y.o. Helene Shoe, Tammi Klippel Primary Care Louella Medaglia: Bing Matter Other Clinician: Referring Rosia Syme: Treating Chianna Spirito/Extender: Truddie Hidden Weeks in Treatment: 6 Vital Signs Time Taken: 11:14 Temperature (F): 98.1 Height (in): 69 Pulse (bpm): 66 Weight (lbs): 250 Respiratory Rate (breaths/min): 19 Body Mass Index (BMI): 36.9 Blood Pressure (mmHg): 85/59 Reference Range: 80 - 120 mg / dl Electronic Signature(s) Signed: 03/18/2022 8:46:54 AM By: Erenest Blank Entered By: Erenest Blank on 02/11/2022 11:15:19

## 2022-03-28 ENCOUNTER — Other Ambulatory Visit: Payer: Self-pay | Admitting: Cardiology

## 2022-03-28 DIAGNOSIS — I480 Paroxysmal atrial fibrillation: Secondary | ICD-10-CM

## 2022-05-06 ENCOUNTER — Encounter: Payer: Self-pay | Admitting: Physician Assistant

## 2022-05-12 ENCOUNTER — Ambulatory Visit (INDEPENDENT_AMBULATORY_CARE_PROVIDER_SITE_OTHER): Payer: BC Managed Care – PPO

## 2022-05-12 DIAGNOSIS — I495 Sick sinus syndrome: Secondary | ICD-10-CM | POA: Diagnosis not present

## 2022-05-13 LAB — CUP PACEART REMOTE DEVICE CHECK
Battery Remaining Longevity: 88 mo
Battery Remaining Percentage: 67 %
Battery Voltage: 2.99 V
Brady Statistic AP VP Percent: 1.6 %
Brady Statistic AP VS Percent: 71 %
Brady Statistic AS VP Percent: 1 %
Brady Statistic AS VS Percent: 26 %
Brady Statistic RA Percent Paced: 70 %
Brady Statistic RV Percent Paced: 1.7 %
Date Time Interrogation Session: 20230807035406
Implantable Lead Implant Date: 20191031
Implantable Lead Implant Date: 20191031
Implantable Lead Location: 753859
Implantable Lead Location: 753860
Implantable Pulse Generator Implant Date: 20191031
Lead Channel Impedance Value: 560 Ohm
Lead Channel Impedance Value: 610 Ohm
Lead Channel Pacing Threshold Amplitude: 0.5 V
Lead Channel Pacing Threshold Amplitude: 0.5 V
Lead Channel Pacing Threshold Pulse Width: 0.5 ms
Lead Channel Pacing Threshold Pulse Width: 0.5 ms
Lead Channel Sensing Intrinsic Amplitude: 12 mV
Lead Channel Sensing Intrinsic Amplitude: 4.4 mV
Lead Channel Setting Pacing Amplitude: 0.75 V
Lead Channel Setting Pacing Amplitude: 1.5 V
Lead Channel Setting Pacing Pulse Width: 0.5 ms
Lead Channel Setting Sensing Sensitivity: 2 mV
Pulse Gen Model: 2272
Pulse Gen Serial Number: 9079034

## 2022-06-11 NOTE — Progress Notes (Signed)
Remote pacemaker transmission.   

## 2022-08-11 ENCOUNTER — Ambulatory Visit (INDEPENDENT_AMBULATORY_CARE_PROVIDER_SITE_OTHER): Payer: BC Managed Care – PPO

## 2022-08-11 DIAGNOSIS — I495 Sick sinus syndrome: Secondary | ICD-10-CM

## 2022-08-12 LAB — CUP PACEART REMOTE DEVICE CHECK
Battery Remaining Longevity: 84 mo
Battery Remaining Percentage: 65 %
Battery Voltage: 3.01 V
Brady Statistic AP VP Percent: 1.9 %
Brady Statistic AP VS Percent: 73 %
Brady Statistic AS VP Percent: 1 %
Brady Statistic AS VS Percent: 24 %
Brady Statistic RA Percent Paced: 73 %
Brady Statistic RV Percent Paced: 2 %
Date Time Interrogation Session: 20231106010023
Implantable Lead Connection Status: 753985
Implantable Lead Connection Status: 753985
Implantable Lead Implant Date: 20191031
Implantable Lead Implant Date: 20191031
Implantable Lead Location: 753859
Implantable Lead Location: 753860
Implantable Pulse Generator Implant Date: 20191031
Lead Channel Impedance Value: 560 Ohm
Lead Channel Impedance Value: 590 Ohm
Lead Channel Pacing Threshold Amplitude: 0.5 V
Lead Channel Pacing Threshold Amplitude: 0.625 V
Lead Channel Pacing Threshold Pulse Width: 0.5 ms
Lead Channel Pacing Threshold Pulse Width: 0.5 ms
Lead Channel Sensing Intrinsic Amplitude: 12 mV
Lead Channel Sensing Intrinsic Amplitude: 5 mV
Lead Channel Setting Pacing Amplitude: 0.875
Lead Channel Setting Pacing Amplitude: 1.5 V
Lead Channel Setting Pacing Pulse Width: 0.5 ms
Lead Channel Setting Sensing Sensitivity: 2 mV
Pulse Gen Model: 2272
Pulse Gen Serial Number: 9079034

## 2022-09-10 NOTE — Progress Notes (Signed)
Remote pacemaker transmission.   

## 2022-09-26 ENCOUNTER — Other Ambulatory Visit: Payer: Self-pay | Admitting: Cardiology

## 2022-09-26 DIAGNOSIS — I480 Paroxysmal atrial fibrillation: Secondary | ICD-10-CM

## 2022-11-10 ENCOUNTER — Ambulatory Visit: Payer: BC Managed Care – PPO

## 2022-11-10 DIAGNOSIS — I495 Sick sinus syndrome: Secondary | ICD-10-CM | POA: Diagnosis not present

## 2022-11-11 LAB — CUP PACEART REMOTE DEVICE CHECK
Battery Remaining Longevity: 80 mo
Battery Remaining Percentage: 63 %
Battery Voltage: 2.99 V
Brady Statistic AP VP Percent: 1.9 %
Brady Statistic AP VS Percent: 73 %
Brady Statistic AS VP Percent: 1 %
Brady Statistic AS VS Percent: 24 %
Brady Statistic RA Percent Paced: 73 %
Brady Statistic RV Percent Paced: 2 %
Date Time Interrogation Session: 20240205020012
Implantable Lead Connection Status: 753985
Implantable Lead Connection Status: 753985
Implantable Lead Implant Date: 20191031
Implantable Lead Implant Date: 20191031
Implantable Lead Location: 753859
Implantable Lead Location: 753860
Implantable Pulse Generator Implant Date: 20191031
Lead Channel Impedance Value: 560 Ohm
Lead Channel Impedance Value: 600 Ohm
Lead Channel Pacing Threshold Amplitude: 0.625 V
Lead Channel Pacing Threshold Amplitude: 0.625 V
Lead Channel Pacing Threshold Pulse Width: 0.5 ms
Lead Channel Pacing Threshold Pulse Width: 0.5 ms
Lead Channel Sensing Intrinsic Amplitude: 12 mV
Lead Channel Sensing Intrinsic Amplitude: 5 mV
Lead Channel Setting Pacing Amplitude: 0.875
Lead Channel Setting Pacing Amplitude: 1.625
Lead Channel Setting Pacing Pulse Width: 0.5 ms
Lead Channel Setting Sensing Sensitivity: 2 mV
Pulse Gen Model: 2272
Pulse Gen Serial Number: 9079034

## 2022-11-11 NOTE — Progress Notes (Signed)
Electrophysiology Office Note Date: 11/13/2022  ID:  [name], DOB [birth year], MRN [id number]  PCP: Richmond Campbell., PA-C Primary Cardiologist: Charlton Haws, MD Electrophysiologist: Will Jorja Loa, MD   CC: Pacemaker follow-up  [name] is a 62 y.o. female seen today for Will Jorja Loa, MD for routine electrophysiology followup. Since last being seen in our clinic the patient reports doing well from a cardiac perspective.   Unfortunately, she lost her husband several weeks ago due to complications from Kidney failure.  She is considering retiring / disability from her work.  She has rare palpitations, but otherwise denies chest pain, dyspnea, PND, orthopnea, nausea, vomiting, dizziness, syncope, edema, weight gain, or early satiety.   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 quit several years ago.   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   pacemaker insertion for tachy/brady syndrome   Spinal cord stimulator status    Spinal stenosis    Tobacco abuse     Current Outpatient Medications  Medication Instructions   ADVAIR DISKUS 250-50 MCG/ACT AEPB 1 puff, Inhalation, 2 times daily   albuterol (PROVENTIL HFA;VENTOLIN HFA) 108 (90 Base) MCG/ACT inhaler 2 puffs, Inhalation, Every 6 hours PRN   apixaban (ELIQUIS) 5 mg, Oral, 2 times daily, PLEASE CONTACT OUR OFFICE TO SCHEDULE AN OVERDUE  APPOINTMENT FOR FURTHER REFILLS [phone]   B Complex Vitamins (B COMPLEX PO) 1 tablet, Oral, Daily   BREO ELLIPTA 200-25 MCG/INH AEPB 1 puff, Inhalation, Daily   Co Q 10 100 mg, Oral, Daily at bedtime   famotidine (PEPCID) 20 mg, Oral, 2 times daily, New Zantac 360T   fluticasone (FLONASE) 50 MCG/ACT nasal spray 2 sprays, Each Nare, As needed   folic acid (FOLVITE) 1 mg, Oral, Daily   hydrochlorothiazide (HYDRODIURIL) 25 mg, Oral, Daily   HYDROmorphone (DILAUDID) 4 mg, Oral, 5 times daily PRN   levothyroxine (SYNTHROID) 25 mcg, Oral, Daily before breakfast   magnesium gluconate (MAGONATE) 500 mg, Oral, Daily at bedtime   metaxalone (SKELAXIN) 800 mg, Oral, 3 times daily   metoprolol succinate (TOPROL-XL) 50 MG 24 hr tablet TAKE 1 TABLET DAILY WITH OR IMMEDIATELY FOLLOWING A MEAL. TAKE WITH 100 MG TAB TO TOTAL 150 MG DAILY   metoprolol succinate (TOPROL-XL) 100 mg, Oral, Daily, Take with or immediately following a meal. Take with 50 mg tab to total 150 mg daily.   naloxone (NARCAN) nasal spray 4 mg/0.1 mL 1 spray, Nasal, As directed   Olopatadine HCl (PATADAY) 0.7 % SOLN 1 drop, Both Eyes, Daily   pregabalin (LYRICA) 150 mg, Oral, 2 times daily, Additional 150 mg in the middle of the day if needed   PROCTOFOAM [facility] rectal foam 1 applicator, Rectal, 3 times daily   RESTASIS MULTIDOSE 0.05 % ophthalmic emulsion 1 drop, Both Eyes, Every evening  Rosuvastatin Calcium 20 mg, Oral, Daily   senna (SENOKOT) 8.6 MG TABS tablet 2 tablets, Oral, Daily at bedtime   tiZANidine (ZANAFLEX) 8 mg, Oral, Daily at bedtime   Vitamin D (Ergocalciferol) (DRISDOL) 50,000 Units, Oral, Every Wed    Family History: Family History  Adopted: Yes  Family history unknown: Yes    Physical Exam: Vitals:   11/13/22 1135  BP: 114/62  Pulse: 69  SpO2: 95%  Weight: 254 lb 6.4 oz (115.4 kg)  Height: 5\' 9"  (1.753 m)     GEN- NAD. A&O x 3. Normal affect HEENT: Normocephalic, atraumatic Lungs- CTAB, Normal  effort.  Heart- Regular rate and rhythm rate and rhythm. No M/G/R.  Extremities- No peripheral edema. no clubbing or cyanosis Skin- warm and dry, no rash or lesion, PPM pocket well healed.  PPM Interrogation-  reviewed in detail today,  See PACEART report.  EKG is ordered today. Personal review shows NSR at 64 bpm (likely AS/VP)  Other studies Reviewed: Additional studies/ records that were reviewed today include: Previous EP office notes, Previous remote checks, Most recent labwork.   Assessment and Plan:  1. Tachy-Brady syndrome s/p Abbott PPM  Normal PPM function See Pace Art report No changes today  2. Paroxysmal AF/flutter S/p ablation 09/2020 Continue toprol and eliquis CHA2DS2VASc  of at least 2. Burden <1%  3. Chronic systolic CHF Echo 05/2018 LVEF 29-52%  Current medicines are reviewed at length with the patient today.    Labs/ tests ordered today include:  Orders Placed This Encounter  Procedures   Basic metabolic panel   CBC   EKG 12-Lead   Disposition:   Follow up with Dr. Elberta Fortis in 6 months   Signed, Graciella Freer, PA-C  11/13/2022 11:56 AM  [facility] [address] [place] [address] [phone] (office) [phone] (fax)

## 2022-11-13 ENCOUNTER — Encounter: Payer: Self-pay | Admitting: Student

## 2022-11-13 ENCOUNTER — Ambulatory Visit: Payer: BC Managed Care – PPO | Attending: Physician Assistant | Admitting: Student

## 2022-11-13 ENCOUNTER — Encounter (HOSPITAL_COMMUNITY): Payer: Self-pay | Admitting: *Deleted

## 2022-11-13 VITALS — BP 114/62 | HR 69 | Ht 69.0 in | Wt 254.4 lb

## 2022-11-13 DIAGNOSIS — I4819 Other persistent atrial fibrillation: Secondary | ICD-10-CM

## 2022-11-13 DIAGNOSIS — I5022 Chronic systolic (congestive) heart failure: Secondary | ICD-10-CM

## 2022-11-13 DIAGNOSIS — I495 Sick sinus syndrome: Secondary | ICD-10-CM | POA: Diagnosis not present

## 2022-11-13 LAB — CUP PACEART INCLINIC DEVICE CHECK
Battery Remaining Longevity: 81 mo
Battery Voltage: 2.99 V
Brady Statistic RA Percent Paced: 73 %
Brady Statistic RV Percent Paced: 2 %
Date Time Interrogation Session: 20240208115624
Implantable Lead Connection Status: 753985
Implantable Lead Connection Status: 753985
Implantable Lead Implant Date: 20191031
Implantable Lead Implant Date: 20191031
Implantable Lead Location: 753859
Implantable Lead Location: 753860
Implantable Pulse Generator Implant Date: 20191031
Lead Channel Impedance Value: 587.5 Ohm
Lead Channel Impedance Value: 662.5 Ohm
Lead Channel Pacing Threshold Amplitude: 0.5 V
Lead Channel Pacing Threshold Amplitude: 0.625 V
Lead Channel Pacing Threshold Pulse Width: 0.5 ms
Lead Channel Pacing Threshold Pulse Width: 0.5 ms
Lead Channel Sensing Intrinsic Amplitude: 12 mV
Lead Channel Sensing Intrinsic Amplitude: 5 mV
Lead Channel Setting Pacing Amplitude: 0.75 V
Lead Channel Setting Pacing Amplitude: 1.625
Lead Channel Setting Pacing Pulse Width: 0.5 ms
Lead Channel Setting Sensing Sensitivity: 2 mV
Pulse Gen Model: 2272
Pulse Gen Serial Number: 9079034

## 2022-11-13 NOTE — Patient Instructions (Signed)
Medication Instructions:  Your physician recommends that you continue on your current medications as directed. Please refer to the Current Medication list given to you today.  *If you need a refill on your cardiac medications before your next appointment, please call your pharmacy*   Lab Work: TODAY: BMET, CBC  If you have labs (blood work) drawn today and your tests are completely normal, you will receive your results only by: [place] (if you have MyChart) OR A paper copy in the mail If you have any lab test that is abnormal or we need to change your treatment, we will call you to review the results.   Follow-Up: At [facility], you and your health needs are our priority.  As part of our continuing mission to provide you with exceptional heart care, we have created designated Provider Care Teams.  These Care Teams include your primary Cardiologist (physician) and Advanced Practice Providers (APPs -  Physician Assistants and Nurse Practitioners) who all work together to provide you with the care you need, when you need it.  Your next appointment:   6 month(s)  Provider:   Allegra Lai, MD

## 2022-11-14 ENCOUNTER — Telehealth: Payer: Self-pay

## 2022-11-14 DIAGNOSIS — Z79899 Other long term (current) drug therapy: Secondary | ICD-10-CM

## 2022-11-14 DIAGNOSIS — E876 Hypokalemia: Secondary | ICD-10-CM

## 2022-11-14 LAB — BASIC METABOLIC PANEL
BUN/Creatinine Ratio: 9 — ABNORMAL LOW (ref 12–28)
BUN: 8 mg/dL (ref 8–27)
CO2: 27 mmol/L (ref 20–29)
Calcium: 9.8 mg/dL (ref 8.7–10.3)
Chloride: 93 mmol/L — ABNORMAL LOW (ref 96–106)
Creatinine, Ser: 0.92 mg/dL (ref 0.57–1.00)
Glucose: 242 mg/dL — ABNORMAL HIGH (ref 70–99)
Potassium: 3.1 mmol/L — ABNORMAL LOW (ref 3.5–5.2)
Sodium: 138 mmol/L (ref 134–144)
eGFR: 71 mL/min/{1.73_m2} (ref >59)

## 2022-11-14 LAB — CBC
Hematocrit: 46.8 % — ABNORMAL HIGH (ref 34.0–46.6)
Hemoglobin: 16.1 g/dL — ABNORMAL HIGH (ref 11.1–15.9)
MCH: 34.1 pg — ABNORMAL HIGH (ref 26.6–33.0)
MCHC: 34.4 g/dL (ref 31.5–35.7)
MCV: 99 fL — ABNORMAL HIGH (ref 79–97)
Platelets: 158 10*3/uL (ref 150–450)
RBC: 4.72 x10E6/uL (ref 3.77–5.28)
RDW: 12.8 % (ref 11.7–15.4)
WBC: 6.5 10*3/uL (ref 3.4–10.8)

## 2022-11-14 MED ORDER — SPIRONOLACTONE 25 MG PO TABS: 25.0000 mg | ORAL_TABLET | Freq: Every day | ORAL | 3 refills | Status: DC

## 2022-11-14 MED ORDER — POTASSIUM CHLORIDE CRYS ER 20 MEQ PO TBCR: 20.0000 meq | EXTENDED_RELEASE_TABLET | Freq: Every day | ORAL | 3 refills | Status: DC

## 2022-11-14 NOTE — Telephone Encounter (Signed)
-----   Message from Shirley Friar, PA-C sent at 11/14/2022  8:51 AM EST ----- Her potassium is quite low.   I would like to STOP her HCTz, and switch her to Spironolactone 25 mg daily.   She should also take potassium 60 meq today (Can split 40 meq + 20 meq several hours later to be easier on her stomach), then start 20 meq daily tomorrow.  (which will be a new prescription for her).   Needs repeat BMET early next week.   I'm in pod E if any questions.

## 2022-11-14 NOTE — Telephone Encounter (Signed)
Spoke with pt and reviewed lab results per Oda Kilts, PA_C.  Pt verbalizes understanding and agrees with current plan.

## 2022-11-19 ENCOUNTER — Ambulatory Visit: Payer: BC Managed Care – PPO | Attending: Student

## 2022-11-19 DIAGNOSIS — E876 Hypokalemia: Secondary | ICD-10-CM

## 2022-11-19 DIAGNOSIS — Z79899 Other long term (current) drug therapy: Secondary | ICD-10-CM

## 2022-11-20 ENCOUNTER — Encounter: Payer: Self-pay | Admitting: Cardiology

## 2022-11-20 ENCOUNTER — Other Ambulatory Visit: Payer: Self-pay

## 2022-11-20 DIAGNOSIS — Z79899 Other long term (current) drug therapy: Secondary | ICD-10-CM

## 2022-11-20 LAB — BASIC METABOLIC PANEL
BUN/Creatinine Ratio: 14 (ref 12–28)
BUN: 12 mg/dL (ref 8–27)
CO2: 17 mmol/L — ABNORMAL LOW (ref 20–29)
Calcium: 9.4 mg/dL (ref 8.7–10.3)
Chloride: 104 mmol/L (ref 96–106)
Creatinine, Ser: 0.88 mg/dL (ref 0.57–1.00)
Glucose: 268 mg/dL — ABNORMAL HIGH (ref 70–99)
Potassium: 3.9 mmol/L (ref 3.5–5.2)
Sodium: 145 mmol/L — ABNORMAL HIGH (ref 134–144)
eGFR: 75 mL/min/{1.73_m2} (ref >59)

## 2022-11-20 MED ORDER — METOPROLOL SUCCINATE ER 100 MG PO TB24: 100.0000 mg | ORAL_TABLET | Freq: Every day | ORAL | 3 refills | Status: DC

## 2022-11-25 ENCOUNTER — Telehealth: Payer: Self-pay

## 2022-11-25 DIAGNOSIS — Z79899 Other long term (current) drug therapy: Secondary | ICD-10-CM

## 2022-11-25 DIAGNOSIS — E876 Hypokalemia: Secondary | ICD-10-CM

## 2022-11-25 NOTE — Telephone Encounter (Signed)
The patient has been notified of the result and verbalized understanding.  All questions (if any) were answered. Bernestine Amass, RN 11/25/2022 10:24 AM  Labs scheduled

## 2022-11-25 NOTE — Telephone Encounter (Signed)
-----   Message from Shirley Friar, PA-C sent at 11/20/2022  7:11 AM EST ----- K looks much better. Please recheck BMET in 2 weeks to make sure trend is OK.

## 2022-12-01 ENCOUNTER — Encounter: Payer: BC Managed Care – PPO | Admitting: Physician Assistant

## 2022-12-04 ENCOUNTER — Ambulatory Visit: Payer: BC Managed Care – PPO | Attending: Student

## 2022-12-04 DIAGNOSIS — Z79899 Other long term (current) drug therapy: Secondary | ICD-10-CM

## 2022-12-04 DIAGNOSIS — E876 Hypokalemia: Secondary | ICD-10-CM

## 2022-12-05 ENCOUNTER — Other Ambulatory Visit: Payer: Self-pay | Admitting: Family Medicine

## 2022-12-05 DIAGNOSIS — M5459 Other low back pain: Secondary | ICD-10-CM

## 2022-12-05 LAB — BASIC METABOLIC PANEL
BUN/Creatinine Ratio: 10 — ABNORMAL LOW (ref 12–28)
BUN: 9 mg/dL (ref 8–27)
CO2: 20 mmol/L (ref 20–29)
Calcium: 9.5 mg/dL (ref 8.7–10.3)
Chloride: 99 mmol/L (ref 96–106)
Creatinine, Ser: 0.92 mg/dL (ref 0.57–1.00)
Glucose: 205 mg/dL — ABNORMAL HIGH (ref 70–99)
Potassium: 4.4 mmol/L (ref 3.5–5.2)
Sodium: 138 mmol/L (ref 134–144)
eGFR: 71 mL/min/{1.73_m2} (ref >59)

## 2022-12-24 NOTE — Progress Notes (Signed)
Remote pacemaker transmission.   

## 2023-01-02 ENCOUNTER — Ambulatory Visit
Admission: RE | Admit: 2023-01-02 | Discharge: 2023-01-02 | Disposition: A | Discharge status: Home / Self Care | Payer: BC Managed Care – PPO | Source: Ambulatory Visit | Attending: Family Medicine | Admitting: Family Medicine

## 2023-01-02 DIAGNOSIS — M5459 Other low back pain: Secondary | ICD-10-CM

## 2023-01-02 MED ORDER — MEPERIDINE HCL 50 MG/ML IJ SOLN: 50.0000 mg | Freq: Once | INTRAMUSCULAR | Status: DC | PRN

## 2023-01-02 MED ORDER — ONDANSETRON HCL 4 MG/2ML IJ SOLN: 4.0000 mg | Freq: Once | INTRAMUSCULAR | Status: DC | PRN

## 2023-01-02 MED ORDER — DIAZEPAM 5 MG PO TABS: 10.0000 mg | ORAL_TABLET | Freq: Once | ORAL | Status: DC

## 2023-01-02 MED ORDER — IOPAMIDOL (ISOVUE-M 200) INJECTION 41%
18.0000 mL | Freq: Once | INTRAMUSCULAR | Status: AC
  Administered 2023-01-02: 18 mL via INTRATHECAL

## 2023-01-02 NOTE — Progress Notes (Signed)
Pt has spinal cord stimulator and reports she has turned it off for her CT myelogram procedure.  

## 2023-01-02 NOTE — Discharge Instructions (Signed)

## 2023-01-13 ENCOUNTER — Other Ambulatory Visit: Payer: Self-pay | Admitting: Cardiology

## 2023-01-13 DIAGNOSIS — I480 Paroxysmal atrial fibrillation: Secondary | ICD-10-CM

## 2023-01-13 NOTE — Telephone Encounter (Signed)
Eliquis 5mg  refill request received. Patient is 62 years old, weight-115.4kg, Crea-0.92 on 12/04/22, Diagnosis-Afib/flutter, and last seen by Otilio Saber on 11/13/22. Dose is appropriate based on dosing criteria. Will send in refill to requested pharmacy.

## 2023-01-22 ENCOUNTER — Other Ambulatory Visit: Payer: Self-pay | Admitting: Cardiology

## 2023-02-05 ENCOUNTER — Other Ambulatory Visit: Payer: Self-pay | Admitting: Cardiology

## 2023-02-09 ENCOUNTER — Ambulatory Visit (INDEPENDENT_AMBULATORY_CARE_PROVIDER_SITE_OTHER): Payer: BC Managed Care – PPO

## 2023-02-09 DIAGNOSIS — I495 Sick sinus syndrome: Secondary | ICD-10-CM | POA: Diagnosis not present

## 2023-02-10 LAB — CUP PACEART REMOTE DEVICE CHECK
Battery Remaining Longevity: 78 mo
Battery Remaining Percentage: 60 %
Battery Voltage: 2.99 V
Brady Statistic AP VP Percent: 1 %
Brady Statistic AP VS Percent: 80 %
Brady Statistic AS VP Percent: 1 %
Brady Statistic AS VS Percent: 20 %
Brady Statistic RA Percent Paced: 80 %
Brady Statistic RV Percent Paced: 1 %
Date Time Interrogation Session: 20240506020025
Implantable Lead Connection Status: 753985
Implantable Lead Connection Status: 753985
Implantable Lead Implant Date: 20191031
Implantable Lead Implant Date: 20191031
Implantable Lead Location: 753859
Implantable Lead Location: 753860
Implantable Pulse Generator Implant Date: 20191031
Lead Channel Impedance Value: 540 Ohm
Lead Channel Impedance Value: 590 Ohm
Lead Channel Pacing Threshold Amplitude: 0.5 V
Lead Channel Pacing Threshold Amplitude: 0.5 V
Lead Channel Pacing Threshold Pulse Width: 0.5 ms
Lead Channel Pacing Threshold Pulse Width: 0.5 ms
Lead Channel Sensing Intrinsic Amplitude: 12 mV
Lead Channel Sensing Intrinsic Amplitude: 3.9 mV
Lead Channel Setting Pacing Amplitude: 0.75 V
Lead Channel Setting Pacing Amplitude: 1.5 V
Lead Channel Setting Pacing Pulse Width: 0.5 ms
Lead Channel Setting Sensing Sensitivity: 2 mV
Pulse Gen Model: 2272
Pulse Gen Serial Number: 9079034

## 2023-03-09 ENCOUNTER — Other Ambulatory Visit: Payer: Self-pay | Admitting: Student

## 2023-03-09 NOTE — Progress Notes (Signed)
Remote pacemaker transmission.   

## 2023-03-13 ENCOUNTER — Other Ambulatory Visit: Payer: Self-pay | Admitting: *Deleted

## 2023-03-13 MED ORDER — POTASSIUM CHLORIDE ER 10 MEQ PO TBCR: 20.0000 meq | EXTENDED_RELEASE_TABLET | Freq: Every day | ORAL | 3 refills | Status: DC

## 2023-03-26 ENCOUNTER — Encounter: Payer: Self-pay | Admitting: Cardiology

## 2023-03-26 DIAGNOSIS — I420 Dilated cardiomyopathy: Secondary | ICD-10-CM

## 2023-03-27 NOTE — Telephone Encounter (Signed)
Made patient a follow-up appointment in November, next available. Will forward to Dr. Eden Emms to see if patient needs to be seen sooner.

## 2023-03-27 NOTE — Telephone Encounter (Signed)
Reviewed patient's transmission.  Normal device function. No episodes or abnormalities that correlate with patient's symptom event.   Reports yesterday morning when getting up to go to the bathroom she became dizzy.  Described as room spinning, has hx of inner ear issue, but also stated that this was the symptom she had when "they found my heart problem".   She denies any SOB, chest pain or syncopal/near syncopal event.   No symptoms since.  Cannot check her blood pressure.   Says she is a patient of Dr. Fabio Bering but I do not see where she has seen him recently.  Patient also mentions having an abnormal CT scan that showed some "problems with a heart valve".    Forwarding to general triage for Dr. Eden Emms and cc:in'g Dr. Lynnae Prude, Pa-C.   Patient is expecting a call back with next steps/follow up needed.

## 2023-04-29 ENCOUNTER — Ambulatory Visit (HOSPITAL_COMMUNITY): Payer: BC Managed Care – PPO | Attending: Cardiovascular Disease

## 2023-04-29 DIAGNOSIS — I420 Dilated cardiomyopathy: Secondary | ICD-10-CM | POA: Diagnosis not present

## 2023-04-29 LAB — ECHOCARDIOGRAM COMPLETE
Area-P 1/2: 5.46 cm2
S' Lateral: 3.9 cm

## 2023-04-29 MED ORDER — PERFLUTREN LIPID MICROSPHERE
1.0000 mL | INTRAVENOUS | Status: AC | PRN
  Administered 2023-04-29: 2 mL via INTRAVENOUS

## 2023-04-30 ENCOUNTER — Encounter: Payer: Self-pay | Admitting: Cardiology

## 2023-05-02 ENCOUNTER — Encounter (HOSPITAL_BASED_OUTPATIENT_CLINIC_OR_DEPARTMENT_OTHER): Payer: Self-pay

## 2023-05-02 ENCOUNTER — Ambulatory Visit (HOSPITAL_BASED_OUTPATIENT_CLINIC_OR_DEPARTMENT_OTHER): Payer: BC Managed Care – PPO | Admitting: Physical Therapy

## 2023-05-06 NOTE — Telephone Encounter (Signed)
Once clearance request is received --- per Dr. Elberta Fortis ok to hold [facility] for procedure.

## 2023-05-07 ENCOUNTER — Telehealth: Payer: Self-pay | Admitting: *Deleted

## 2023-05-07 ENCOUNTER — Encounter (HOSPITAL_BASED_OUTPATIENT_CLINIC_OR_DEPARTMENT_OTHER): Payer: Self-pay | Admitting: Physical Therapy

## 2023-05-07 NOTE — Telephone Encounter (Signed)
   Pre-operative Risk Assessment    Patient Name: [name]  DOB: August 14, 1961 MRN: [id number]      Request for Surgical Clearance    Procedure:   FLUORO GUIDED INJECTION PROCEDURE  Date of Surgery:  Clearance 05/14/23                                 Surgeon:  DR. HONGTAO MICHEAL GUO Surgeon's Group or Practice Name:  [facility] Phone number:  [phone] Fax number:  [phone]   Type of Clearance Requested:   - Medical  - Pharmacy:  Hold Apixaban (Eliquis) x 3-5 DAYS PRIOR   Type of Anesthesia:  Not Indicated   Additional requests/questions:    Elpidio Anis   05/07/2023, 10:41 AM

## 2023-05-07 NOTE — Telephone Encounter (Signed)
Per pre op APP Carlos Levering, NP to add pt to tomorrow at 1:20 due to proc date and med hold. Med rec and consent are done.      Patient Consent for Virtual Visit        [name] has provided verbal consent on 05/07/2023 for a virtual visit (video or telephone).   CONSENT FOR VIRTUAL VISIT FOR:  [name]  By participating in this virtual visit I agree to the following:  I hereby voluntarily request, consent and authorize [facility] and its employed or contracted physicians, physician assistants, nurse practitioners or other licensed health care professionals (the Practitioner), to provide me with telemedicine health care services (the "Services") as deemed necessary by the treating Practitioner. I acknowledge and consent to receive the Services by the Practitioner via telemedicine. I understand that the telemedicine visit will involve communicating with the Practitioner through live audiovisual communication technology and the disclosure of certain medical information by electronic transmission. I acknowledge that I have been given the opportunity to request an in-person assessment or other available alternative prior to the telemedicine visit and am voluntarily participating in the telemedicine visit.  I understand that I have the right to withhold or withdraw my consent to the use of telemedicine in the course of my care at any time, without affecting my right to future care or treatment, and that the Practitioner or I may terminate the telemedicine visit at any time. I understand that I have the right to inspect all information obtained and/or recorded in the course of the telemedicine visit and may receive copies of available information for a reasonable fee.  I understand that some of the potential risks of receiving the Services via telemedicine include:  Delay or interruption in medical evaluation due to technological equipment failure or disruption; Information  transmitted may not be sufficient (e.g. poor resolution of images) to allow for appropriate medical decision making by the Practitioner; and/or  In rare instances, security protocols could fail, causing a breach of personal health information.  Furthermore, I acknowledge that it is my responsibility to provide information about my medical history, conditions and care that is complete and accurate to the best of my ability. I acknowledge that Practitioner's advice, recommendations, and/or decision may be based on factors not within their control, such as incomplete or inaccurate data provided by me or distortions of diagnostic images or specimens that may result from electronic transmissions. I understand that the practice of medicine is not an exact science and that Practitioner makes no warranties or guarantees regarding treatment outcomes. I acknowledge that a copy of this consent can be made available to me via my patient portal [facility] MyChart), or I can request a printed copy by calling the office of [facility].    I understand that my insurance will be billed for this visit.   I have read or had this consent read to me. I understand the contents of this consent, which adequately explains the benefits and risks of the Services being provided via telemedicine.  I have been provided ample opportunity to ask questions regarding this consent and the Services and have had my questions answered to my satisfaction. I give my informed consent for the services to be provided through the use of telemedicine in my medical care

## 2023-05-07 NOTE — Telephone Encounter (Signed)
Please advise holding Eliquis prior to fluro guided spinal nerve injections.   Thank you!  DW

## 2023-05-07 NOTE — Telephone Encounter (Signed)
Patient with diagnosis of afib on Eliquis for anticoagulation.    Procedure:   FLUORO GUIDED INJECTION PROCEDURE  Date of procedure: 05/14/23   CHA2DS2-VASc Score = 3   This indicates a 3.2% annual risk of stroke. The patient's score is based upon: CHF History: 1 HTN History: 1 Diabetes History: 0 Stroke History: 0 Vascular Disease History: 0 Age Score: 0 Gender Score: 1      CrCl 87 ml/min.  Per office protocol, patient can hold Eliquis for 3 days prior to procedure.    **This guidance is not considered finalized until pre-operative APP has relayed final recommendations.**

## 2023-05-07 NOTE — Telephone Encounter (Signed)
Resending this as high priority:  Please advise holding Eliquis prior to fluro guided spinal nerve injections.    Thank you!   DW

## 2023-05-07 NOTE — Telephone Encounter (Signed)
   Name: [name]  DOB: 29-Dec-1960  MRN: [id number]  Primary Cardiologist: Charlton Haws, MD   Preoperative team, please contact this patient and set up a phone call appointment for further preoperative risk assessment. Please obtain consent and complete medication review. Thank you for your help.  I confirm that guidance regarding antiplatelet and oral anticoagulation therapy has been completed and, if necessary, noted below.  Per pharm D, hold Eliquis for 3 days prior to procedure.    Carlos Levering, NP 05/07/2023, 4:27 PM [facility]

## 2023-05-07 NOTE — Telephone Encounter (Signed)
Per pre op APP Tracy Levering, NP to add pt to tomorrow at 1:20 due to proc date and med hold. Med rec and consent are done.

## 2023-05-08 ENCOUNTER — Ambulatory Visit: Payer: BC Managed Care – PPO | Attending: Cardiovascular Disease | Admitting: Student

## 2023-05-08 DIAGNOSIS — Z01810 Encounter for preprocedural cardiovascular examination: Secondary | ICD-10-CM | POA: Diagnosis not present

## 2023-05-08 NOTE — Progress Notes (Signed)
Virtual Visit via Telephone Note   Because of [name]'s co-morbid illnesses, she is at least at moderate risk for complications without adequate follow up.  This format is felt to be most appropriate for this patient at this time.  The patient did not have access to video technology/had technical difficulties with video requiring transitioning to audio format only (telephone).  All issues noted in this document were discussed and addressed.  No physical exam could be performed with this format.  Please refer to the patient's chart for her consent to telehealth for [facility].  Evaluation Performed:  Preoperative cardiovascular risk assessment _____________   Date:  05/08/2023   Patient ID:  [name], DOB 1960-11-03, MRN [id number] Patient Location:  Home Provider location:   Office  Primary Care Provider:  Richmond Campbell., PA-C Primary Cardiologist:  Charlton Haws, MD  Chief Complaint / Patient Profile   62 y.o. y/o female with a h/o chronic systolic heart failure, PAF s/p ablation December 2021 on anticoagulation, tachybrady syndrome s/p PPM October 2019, hypertension, hyperlipidemia, alcoholic cirrhosis of the liver, esophageal varices, GERD, COPD, hypothyroidism who is pending fluro guided spinal nerve injections followed by ablation 1 week later by Dr. Roetta Sessions and presents today for telephonic preoperative cardiovascular risk assessment.  History of Present Illness    [name] is a 62 y.o. female who presents via audio/video conferencing for a telehealth visit today.  Pt was last seen in cardiology clinic on 11/13/2022 by Otilio Saber, PA-C.  At that time EMOGENE [name] was doing well.  The patient is now pending procedure as outlined above. Since her last visit, she is doing well from a cardiac standpoint. Patient denies shortness of breath or dyspnea on exertion. No chest pain, pressure, or tightness. Denies lower extremity edema, orthopnea, or PND. No  palpitations.  Her activity is limited by back pain. She is independent with all ADLs, she can do some slow walking, and light to moderate household activities.   Past Medical History    Past Medical History:  Diagnosis Date   Alcohol abuse    a. quit several years ago.   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   pacemaker insertion for tachy/brady syndrome   Spinal cord stimulator status    Spinal stenosis    Tobacco abuse    Past Surgical History:  Procedure Laterality Date   ANKLE SURGERY     ATRIAL FIBRILLATION ABLATION N/A 10/02/2020   Procedure: ATRIAL FIBRILLATION ABLATION;  Surgeon: Regan Lemming, MD;  [facility]: [facility];  Service: Cardiovascular;  Laterality: N/A;   CERVICAL CONE BIOPSY     CESAREAN SECTION     COLONOSCOPY     HERNIA REPAIR     KNEE SURGERY     x5   ORIF ANKLE FRACTURE Left 06/02/2018   Procedure: OPEN REDUCTION INTERNAL FIXATION (ORIF) ANKLE FRACTURE;  Surgeon:  Toni Arthurs, MD;  Location: [facility];  Service: Orthopedics;  Laterality: Left;   ORIF ANKLE FRACTURE Left 08/26/2018   Procedure: Left ankle syndesmosis OPEN REDUCTION INTERNAL FIXATION (ORIF)/ reconstruction of deltoid ligament;  Surgeon: Toni Arthurs, MD;  Location: [facility];  Service: Orthopedics;  Laterality: Left;   PACEMAKER IMPLANT N/A 08/05/2018   Procedure: PACEMAKER IMPLANT;  Surgeon: Regan Lemming, MD;  [facility]: [facility];  Service: Cardiovascular;  Laterality: N/A;    POLYPECTOMY     SPINAL CORD STIMULATOR INSERTION N/A 05/07/2016   Procedure: LUMBAR SPINAL CORD STIMULATOR INSERTION;  Surgeon: Venita Lick, MD;  Location: [facility];  Service: Orthopedics;  Laterality: N/A;    Allergies  Allergies  Allergen Reactions   Short Ragweed Pollen Ext Cough and Other (See Comments)    Other reaction(s): Eye Redness Other reaction(s): Cough   Sulfa Antibiotics Hives and Rash    Other reaction(s): Other (See Comments) GI Upset Other reaction(s): Other other GI Upset    Sulfamethoxazole Hives    GI Upset   Vancomycin Hives   Teicoplanin     Other reaction(s): GI Upset (intolerance)  Other Reaction(s): GI Intolerance   Azithromycin Rash    Home Medications    Prior to Admission medications   Medication Sig Start Date End Date Taking? Authorizing Provider  ADVAIR DISKUS 250-50 MCG/ACT AEPB Inhale 1 puff into the lungs 2 (two) times daily. 09/15/22   [provider]  albuterol (PROVENTIL HFA;VENTOLIN HFA) 108 (90 Base) MCG/ACT inhaler Inhale 2 puffs into the lungs every 6 (six) hours as needed for wheezing or shortness of breath.  05/14/18   [provider]  apixaban (ELIQUIS) 5 MG TABS tablet Take 1 tablet (5 mg total) by mouth 2 (two) times daily. 01/13/23   Camnitz, Andree Coss, MD  B Complex Vitamins (B COMPLEX PO) Take 1 tablet by mouth daily.    [provider]  BREO ELLIPTA 200-25 MCG/INH AEPB Inhale 1 puff into the lungs daily. 05/17/18   [provider]  Coenzyme Q10 (CO Q 10) 100 MG CAPS Take 100 mg by mouth at bedtime.    [provider]  famotidine (PEPCID) 20 MG tablet Take 20 mg by mouth 2 (two) times daily. New Zantac 360T 05/26/18   [provider]  fluticasone (FLONASE) 50 MCG/ACT nasal spray Place 2 sprays into both nostrils as needed.    [provider]  folic acid (FOLVITE) 1 MG tablet Take 1 mg by mouth daily.  04/01/16   [provider]  HYDROmorphone (DILAUDID) 4 MG  tablet Take 4 mg by mouth 5 (five) times daily as needed for severe pain. 03/03/20   [provider]  levothyroxine (SYNTHROID) 25 MCG tablet Take 25 mcg by mouth daily before breakfast.  03/31/16   [provider]  magnesium gluconate (MAGONATE) 500 MG tablet Take 500 mg by mouth at bedtime.    [provider]  metaxalone (SKELAXIN) 800 MG tablet Take 800 mg by mouth 3 (three) times daily. 02/20/20   [provider]  metoprolol succinate (TOPROL-XL) 100 MG 24 hr tablet Take 1 tablet (100 mg total) by mouth daily. Take with or immediately following a meal. Take with 50 mg tab to total 150 mg daily. 11/20/22   Camnitz, Andree Coss, MD  metoprolol succinate (TOPROL-XL) 50 MG 24 hr tablet TAKE 1 TABLET DAILY WITH OR IMMEDIATELY FOLLOWING A MEAL. TAKE WITH 100 MG TAB TO TOTAL 150 MG DAILY 02/06/23  Camnitz, Will Daphine Deutscher, MD  naloxone [facility]) nasal spray 4 mg/0.1 mL Place 1 spray into the nose as directed. 09/02/22   [provider]  Olopatadine HCl (PATADAY) 0.7 % SOLN Place 1 drop into both eyes daily. 12/29/19   [provider]  potassium chloride (KLOR-CON) 10 MEQ tablet Take 2 tablets (20 mEq total) by mouth daily. 03/13/23   Graciella Freer, PA-C  pregabalin (LYRICA) 150 MG capsule Take 150 mg by mouth 2 (two) times daily. Additional 150 mg in the middle of the day if needed 11/15/15   [provider]  PROCTOFOAM [facility] rectal foam Place 1 applicator rectally 3 (three) times daily.    [provider]  RESTASIS MULTIDOSE 0.05 % ophthalmic emulsion Place 1 drop into both eyes every evening. 03/12/20   [provider]  Rosuvastatin Calcium 20 MG CPSP Take 20 mg by mouth daily.    [provider]  senna (SENOKOT) 8.6 MG TABS tablet Take 2 tablets by mouth at bedtime.    [provider]  spironolactone (ALDACTONE) 25 MG tablet Take 1 tablet (25 mg total) by mouth daily. 11/14/22   Graciella Freer, PA-C   tiZANidine (ZANAFLEX) 4 MG tablet Take 8 mg by mouth at bedtime. 12/26/19   [provider]  Vitamin D, Ergocalciferol, (DRISDOL) 50000 units CAPS capsule Take 50,000 Units by mouth every Wednesday.  03/16/16   [provider]    Physical Exam    Vital Signs:  KELCE BOUTON does not have vital signs available for review today.  Given telephonic nature of communication, physical exam is limited. AAOx3. NAD. Normal affect.  Speech and respirations are unlabored.  Accessory Clinical Findings    None  Assessment & Plan    Primary Cardiologist: Charlton Haws, MD  Preoperative cardiovascular risk assessment. Fluro guided spinal nerve injections by Dr. Roetta Sessions on 05/14/2023 followed by ablation the week following.   Chart reviewed as part of pre-operative protocol coverage. According to the RCRI, patient has a 0.9% risk of MACE. Patient reports activity equivalent to 5.07 METS (Per [facility]AS[facility]).   Given past medical history and time since last visit, based on ACC/AHA guidelines, MARQUITA LIAS would be at acceptable risk for the planned procedure without further cardiovascular testing.   Patient was advised that if she develops new symptoms prior to surgery to contact our office to arrange a follow-up appointment.  she verbalized understanding.  Per pharm D, patient may hold Eliquis for 3 days prior to procedure.   I will route this recommendation to the requesting party via Epic fax function.  Please call with questions.  Time:   Today, I have spent 7 minutes with the patient with telehealth technology discussing medical history, symptoms, and management plan.     Carlos Levering, NP  05/08/2023, 9:43 AM

## 2023-05-11 ENCOUNTER — Ambulatory Visit (INDEPENDENT_AMBULATORY_CARE_PROVIDER_SITE_OTHER): Payer: BC Managed Care – PPO

## 2023-05-11 DIAGNOSIS — I495 Sick sinus syndrome: Secondary | ICD-10-CM | POA: Diagnosis not present

## 2023-05-11 LAB — CUP PACEART REMOTE DEVICE CHECK
Battery Remaining Longevity: 74 mo
Battery Remaining Percentage: 58 %
Battery Voltage: 2.99 V
Brady Statistic AP VP Percent: 1 %
Brady Statistic AP VS Percent: 78 %
Brady Statistic AS VP Percent: 1 %
Brady Statistic AS VS Percent: 22 %
Brady Statistic RA Percent Paced: 77 %
Brady Statistic RV Percent Paced: 1 %
Date Time Interrogation Session: 20240805023237
Implantable Lead Connection Status: 753985
Implantable Lead Connection Status: 753985
Implantable Lead Implant Date: 20191031
Implantable Lead Implant Date: 20191031
Implantable Lead Location: 753859
Implantable Lead Location: 753860
Implantable Pulse Generator Implant Date: 20191031
Lead Channel Impedance Value: 550 Ohm
Lead Channel Impedance Value: 600 Ohm
Lead Channel Pacing Threshold Amplitude: 0.5 V
Lead Channel Pacing Threshold Amplitude: 0.5 V
Lead Channel Pacing Threshold Pulse Width: 0.5 ms
Lead Channel Pacing Threshold Pulse Width: 0.5 ms
Lead Channel Sensing Intrinsic Amplitude: 12 mV
Lead Channel Sensing Intrinsic Amplitude: 5 mV
Lead Channel Setting Pacing Amplitude: 0.75 V
Lead Channel Setting Pacing Amplitude: 1.5 V
Lead Channel Setting Pacing Pulse Width: 0.5 ms
Lead Channel Setting Sensing Sensitivity: 2 mV
Pulse Gen Model: 2272
Pulse Gen Serial Number: 9079034

## 2023-05-22 NOTE — Progress Notes (Signed)
Remote pacemaker transmission.   

## 2023-05-26 ENCOUNTER — Encounter: Payer: BC Managed Care – PPO | Admitting: Cardiology

## 2023-06-01 NOTE — Therapy (Unsigned)
OUTPATIENT PHYSICAL THERAPY THORACOLUMBAR EVALUATION   Patient Name: [name] MRN: [id number] DOB:November 14, 1960, 62 y.o., female Today's Date: 06/03/2023  END OF SESSION:  PT End of Session - 06/02/23 1806     Visit Number 1    Number of Visits 20    Date for PT Re-Evaluation 08/11/23    Authorization Type BCBS    PT Start Time 1125    PT Stop Time 1200    PT Time Calculation (min) 35 min    Activity Tolerance Patient tolerated treatment well    Behavior During Therapy WFL for tasks assessed/performed             Past Medical History:  Diagnosis Date   Alcohol abuse    a. quit several years ago.   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   pacemaker insertion for tachy/brady syndrome   Spinal cord stimulator status    Spinal stenosis    Tobacco abuse    Past Surgical History:  Procedure Laterality Date   ANKLE SURGERY     ATRIAL FIBRILLATION ABLATION N/A 10/02/2020   Procedure: ATRIAL FIBRILLATION ABLATION;  Surgeon: Regan Lemming, MD;  [facility];  Service: Cardiovascular;  Laterality: N/A;   CERVICAL CONE BIOPSY     CESAREAN SECTION     COLONOSCOPY     HERNIA REPAIR     KNEE SURGERY     x5   ORIF ANKLE FRACTURE Left 06/02/2018   Procedure: OPEN REDUCTION INTERNAL FIXATION (ORIF) ANKLE FRACTURE;  Surgeon: Toni Arthurs, MD;  Location: [facility];  Service: Orthopedics;  Laterality: Left;   ORIF ANKLE FRACTURE Left 08/26/2018   Procedure: Left ankle syndesmosis OPEN  REDUCTION INTERNAL FIXATION (ORIF)/ reconstruction of deltoid ligament;  Surgeon: Toni Arthurs, MD;  Location: [facility];  Service: Orthopedics;  Laterality: Left;   PACEMAKER IMPLANT N/A 08/05/2018   Procedure: PACEMAKER IMPLANT;  Surgeon: Regan Lemming, MD;  [facility];  Service: Cardiovascular;  Laterality: N/A;   POLYPECTOMY     SPINAL CORD STIMULATOR INSERTION N/A 05/07/2016   Procedure: LUMBAR SPINAL CORD STIMULATOR INSERTION;  Surgeon: Venita Lick, MD;  Location: [facility];  Service: Orthopedics;  Laterality: N/A;   Patient Active Problem List   Diagnosis Date Noted   Tachy-brady syndrome (HCC) 08/05/2018   Closed fracture of left ankle 06/01/2018   Paroxysmal atrial fibrillation (HCC) 06/01/2018   Benign essential hypertension 06/01/2018   Atrial flutter (HCC) 06/01/2018   Near syncope 06/01/2018   Bradycardia    Posterior tibial tendon dysfunction (PTTD) of right lower extremity 03/15/2018   Primary localized osteoarthrosis of ankle and foot 01/21/2018   Long-term current use of opiate analgesic 11/10/2017   Therapeutic opioid induced constipation 10/27/2017   On long term drug therapy 10/14/2017   Chronic low back pain 10/14/2017   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) 10/14/2017   Chronic pain syndrome 10/14/2017   Pain in left foot 10/12/2017   Pain in right foot 10/12/2017   B12 deficiency 04/21/2016   Cobalamin deficiency 04/21/2016   Thrombocytopenia (HCC) 11/28/2015   Chronic pain 11/28/2015   Disease of jaw 11/28/2015   Hypokalemia 11/28/2015   Impaired fasting glucose 11/28/2015   Swelling of both lower extremities 11/28/2015   Tobacco abuse 11/28/2015   Vertigo 11/28/2015   Hereditary and idiopathic neuropathy 11/28/2015   Hereditary and idiopathic peripheral neuropathy 09/17/2009   Disorder of peripheral nervous system 09/17/2009   Alcoholic cirrhosis of liver (HCC) 10/02/2008   JAUNDICE 10/02/2008    Generalized abdominal pain 10/02/2008   Ascites 10/02/2008   Jaundice 10/02/2008   ESOPHAGEAL VARICES WITHOUT MENTION OF BLEEDING 04/04/2008   Esophageal varices (HCC) 04/04/2008   Alcohol abuse 03/16/2008   TRANSAMINASES, SERUM, ELEVATED 03/16/2008   Elevated levels of transaminase & lactic acid dehydrogenase 03/16/2008   SCARLET FEVER 03/14/2008   Hyperlipidemia 03/14/2008   External hemorrhoids 03/14/2008   Umbilical hernia 03/14/2008   Endometriosis 03/14/2008   DYSMENORRHEA 03/14/2008   INSOMNIA UNSPECIFIED 03/14/2008   Large liver 03/14/2008   Splenomegaly 03/14/2008   LIVER FUNCTION TESTS, ABNORMAL, HX OF 03/14/2008   ANAL FISSURE, HX OF 03/14/2008   Insomnia 03/14/2008   Allergic rhinitis 05/31/2007   Gastroesophageal reflux disease 05/31/2007   Osteoarthritis 05/31/2007    PCP: Richmond Campbell., PA-C   REFERRING PROVIDER: Pincus Badder, FNP   REFERRING DIAG: M54.50 (ICD-10-CM) - Low back pain, unspecified   Rationale for Evaluation and Treatment: Rehabilitation  THERAPY DIAG:  Other low back pain  Muscle weakness (generalized)  Other abnormalities of gait and mobility  Unsteadiness on feet  ONSET DATE: 7 YRS  SUBJECTIVE:                                                                                                                                                                                           SUBJECTIVE STATEMENT: 8/15 steroid injection/ Lumbar facet arthropathy.  Will be having an ablation in Oct.  Left ankle replacement, bilat knee OA.  My feet and knees turn out.  Toes in left foot no sensation. Use cane to stop from falling. L4 ruptured disc 7 yrs ago wanted off of opioids so went with the stimulator.  Was unable to get off of opioids due to severity of pain. Back pain is high and constant.  Can't walk far. Being seen at [facility]   PERTINENT HISTORY:  Left ankle pain, S/P ankle replacement surgery At [facility] January 2023 Right wrist  injury S/P pacemaker implant 2019 ,atrial flutter/AFib?  Chronic low back pain, spinal cord stimulator 2017 Tobacco dependence UDS 03/21/2019, 02/22/20, 07/22/21, 08/11/22 Eliquis SOAPP score 5 Oswestry score 20%, 42% (04/07/2022) Pain agreement 08/11/22;02/02/23 Narcan RX back and left buttock pain; CT myelogram L3-4 spinal stenosis loss of spouse-January 2024  PAIN:  Are you having pain? Yes: NPRS scale: usual/current 8/10; worst 8/10; least 5/10 Pain location: LB across L>R into hips Pain description: throbs with sitting; stabbing with walking Aggravating factors: Amb > 25 yds makes my eyes tear Relieving factors: opioids, lying down  PRECAUTIONS: Other: spinal cord stimulation  RED FLAGS: None   WEIGHT BEARING RESTRICTIONS: No  FALLS:  Has patient fallen in last 6 months? No  LIVING ENVIRONMENT: Lives with: lives with their daughter Lives in: House/apartment Stairs: Yes: Internal: 15 steps; can reach both Has following equipment at home: Single point cane, Wheelchair (manual), Shower bench, Grab bars, and lift chair  OCCUPATION: disabled  PLOF: Requires assistive device for independence  PATIENT GOALS: decrease muscle tightness in LB m;  NEXT MD VISIT: sept  OBJECTIVE:   DIAGNOSTIC FINDINGS:  01/02/23 CT Lumbar Spine IMPRESSION: 1. Progressive, advanced right-sided disc degeneration at L3-4 with a prominent right-sided disc osteophyte complex resulting in moderate spinal stenosis, moderate right lateral recess stenosis, and severe right neural foraminal stenosis. 2. Progressive, severe multilevel facet arthrosis. Grade 1 anterolisthesis develops with standing at L4-5. 3. New mild spinal stenosis and mild right neural foraminal stenosis at L4-5. 4. New mild left neural foraminal stenosis at L5-S1. 5.  Aortic Atherosclerosis (ICD10-I70.0)  Left ankle 06/01/23 Impression:  1. Stable appearing left total ankle arthroplasty. 2. Stable midfoot fusion. 3. Diffuse  demineralization. 4. Hindfoot valgus.   PATIENT SURVEYS:  FOTO risk adjusted 38% with goal of 50%   COGNITION: Overall cognitive status: Within functional limits for tasks assessed     SENSATION: IMPAIRED LEFT FOREFOOT/TOES  MUSCLE LENGTH: Hamstrings: tight   POSTURE:  bilat knee valgus deformity; bilat ankle inversion and pronation , decreased lumbar lordosis  PALPATION: No TTP in LB; bilat knee crepitus LUMBAR ROM:   AROM eval  Flexion FT to mid shin  Extension 25%P!  Right lateral flexion 25%P!  Left lateral flexion 50%P!  Right rotation   Left rotation    (Blank rows = not tested)  LOWER EXTREMITY ROM:     Active  Right eval Left eval  Hip flexion    Hip extension    Hip abduction    Hip adduction    Hip internal rotation    Hip external rotation    Knee flexion 120 120  Knee extension 0 passive 0 passive  Ankle dorsiflexion  2d  Ankle plantarflexion  21d  Ankle inversion    Ankle eversion     (Blank rows = not tested)  LOWER EXTREMITY MMT:    MMT Right eval Left eval  Hip flexion 36.6 39.8  Hip extension    Hip abduction 29.2 37.1  Hip adduction    Hip internal rotation    Hip external rotation    Knee flexion    Knee extension 40.6 34.6  Ankle dorsiflexion    Ankle plantarflexion    Ankle inversion    Ankle eversion     (Blank rows = not tested)  LUMBAR SPECIAL TESTS:  Straight leg raise test: Positive and Thomas test: Negative  FUNCTIONAL TESTS:  Timed up and go (TUG): 17.62   GAIT: Distance walked: 400 ft Assistive device utilized: Single point cane Level of assistance: Modified independence Comments: External hip  rotation, feet in pronation  TODAY'S TREATMENT:                                                                                                                              Evaluation Functional testing Foto   PATIENT EDUCATION:  Education details: Discussed eval findings, rehab rationale, aquatic program  progression/POC and pools in area. Patient is in agreement  Person educated: Patient Education method: Explanation Education comprehension: verbalized understanding  HOME EXERCISE PROGRAM: TBA  ASSESSMENT:  CLINICAL IMPRESSION: Patient is a 62 y.o. f who was seen today for physical therapy evaluation and treatment for LBP.  She has a complicated Pmhx with multiple orthopedic implications; bil hip, knee and feet oa; Left total ankle replacement with hindfoot valgus; Lumbar DDD, spinal stenosis, NFS, severe multilevel facet arthrosis. Grade 1 anterolisthesis L4-5.  She has been taking narcotics for years for pain control. Her main complaint today is back pain.  She is being seen at [facility] and is planning on a spinal nerve ablation in Oct. She presents with high pain sensitivity throughout using a cane for balance. She has significant postural dysfunction and muscle weakness, poor balance and gait deviation.  Due to the complicated nature of pts situation she will be seen in aquatics only to benefit from the properties of water to address all function, balance, strength and pain then potentially transition to land based intervention. Plan to re-assess as needed to modify or adjust goals as pt progresses   OBJECTIVE IMPAIRMENTS: Abnormal gait, decreased activity tolerance, decreased balance, decreased coordination, decreased endurance, decreased mobility, difficulty walking, decreased ROM, decreased strength, impaired flexibility, impaired sensation, postural dysfunction, and pain.   ACTIVITY LIMITATIONS: carrying, lifting, bending, sitting, standing, squatting, stairs, transfers, locomotion level, and caring for others  PARTICIPATION LIMITATIONS: meal prep, cleaning, laundry, driving, shopping, community activity, occupation, and yard work  PERSONAL FACTORS: Fitness, Past/current experiences, Time since onset of injury/illness/exacerbation, and 3+ comorbidities: see impression statement  are also  affecting patient's functional outcome.   REHAB POTENTIAL: Good  CLINICAL DECISION MAKING: Unstable/unpredictable  EVALUATION COMPLEXITY: High   GOALS: Goals reviewed with patient? Yes  SHORT TERM GOALS: Target date: 07/18/23  Pt will tolerate full aquatic sessions consistently without increase in pain and with improving function to demonstrate good toleration and effectiveness of intervention.  Baseline: Goal status: INITIAL  2.  Pt will report decrease in usual pain by up to 2 NPRS for improved toleration to activity/quality of life and to demonstrate improved management of pain. Baseline: 8/10 Goal status: INITIAL  3.  Pt will tolerate walking to or from setting along with tolerating full aquatic session without significant increase in pain or fatigue Baseline:  Goal status: INITIAL  4.  Pt will improve on Tug test to <or= 14s to demonstrate improvement in lower extremity function, mobility and decreased fall risk. Baseline: 17.62 Goal status: INITIAL  5.  Pt will be able to ambulate submerged ue support as needed up  to 10 consecutive minutes to demonstrate improving toleration to amb. Baseline:  Goal status: INITIAL    LONG TERM GOALS: Target date: 08/11/23  Pt to meet stated Foto Goal of 50% Baseline: 38% Goal status: INITIAL  2.  Pt will be indep with final HEP's (land and aquatic as appropriate) for continued management of condition  Baseline: none Goal status: INITIAL  3.  Pt will improve strength in all areas listed by  up to 10lbs to demonstrate improved overall physical function Baseline: see chart Goal status: INITIAL  4.  Pt will report decrease in worst pain to </= 6/10 for improved toleration to activity/quality of life and to demonstrate improved management of pain.  Baseline: 10/10 Goal status: INITIAL  5.  Pt will be able to amb to and from setting (400 - 500 ft each way) using AD as needed to demonstrate improved gait and endurance/aerobic  capacity. Baseline: 75 ft Goal status: INITIAL  6.  Pt to improve lumbar ROM by 25% for improved function Baseline: see chart Goal status: INITIAL  PLAN:  PT FREQUENCY: 2x/week  PT DURATION: 10 weeks  PLANNED INTERVENTIONS: Therapeutic exercises, Therapeutic activity, Neuromuscular re-education, Balance training, Gait training, Patient/Family education, Self Care, Joint mobilization, Joint manipulation, Stair training, Orthotic/Fit training, DME instructions, Aquatic Therapy, Dry Needling, Electrical stimulation, Spinal mobilization, Cryotherapy, Moist heat, Taping, Traction, Ionotophoresis 4mg /ml Dexamethasone, Manual therapy, and Re-evaluation.  PLAN FOR NEXT SESSION: aquatic: gait, endurance, strength and balance training, pain control.   Corrie Dandy Tomma Lightning) Umaima Scholten MPT 06/03/2023, 6:36 AM

## 2023-06-02 ENCOUNTER — Encounter (HOSPITAL_BASED_OUTPATIENT_CLINIC_OR_DEPARTMENT_OTHER): Payer: Self-pay | Admitting: Physical Therapy

## 2023-06-02 ENCOUNTER — Other Ambulatory Visit: Payer: Self-pay

## 2023-06-02 ENCOUNTER — Ambulatory Visit (HOSPITAL_BASED_OUTPATIENT_CLINIC_OR_DEPARTMENT_OTHER): Payer: BC Managed Care – PPO | Attending: Family Medicine | Admitting: Physical Therapy

## 2023-06-02 DIAGNOSIS — R2681 Unsteadiness on feet: Secondary | ICD-10-CM | POA: Diagnosis present

## 2023-06-02 DIAGNOSIS — M6281 Muscle weakness (generalized): Secondary | ICD-10-CM | POA: Diagnosis present

## 2023-06-02 DIAGNOSIS — R2689 Other abnormalities of gait and mobility: Secondary | ICD-10-CM | POA: Diagnosis present

## 2023-06-02 DIAGNOSIS — M5459 Other low back pain: Secondary | ICD-10-CM | POA: Insufficient documentation

## 2023-06-05 ENCOUNTER — Ambulatory Visit (HOSPITAL_BASED_OUTPATIENT_CLINIC_OR_DEPARTMENT_OTHER): Payer: BC Managed Care – PPO | Admitting: Physical Therapy

## 2023-06-05 DIAGNOSIS — R2681 Unsteadiness on feet: Secondary | ICD-10-CM

## 2023-06-05 DIAGNOSIS — M5459 Other low back pain: Secondary | ICD-10-CM | POA: Diagnosis not present

## 2023-06-05 DIAGNOSIS — R2689 Other abnormalities of gait and mobility: Secondary | ICD-10-CM

## 2023-06-05 DIAGNOSIS — M6281 Muscle weakness (generalized): Secondary | ICD-10-CM

## 2023-06-05 NOTE — Therapy (Signed)
OUTPATIENT PHYSICAL THERAPY THORACOLUMBAR TREATMENT   Patient Name: [name] MRN: [id number] DOB:1961/09/04, 62 y.o., female Today's Date: 06/05/2023  END OF SESSION:  PT End of Session - 06/05/23 0942     Visit Number 2    Number of Visits 20    Date for PT Re-Evaluation 08/11/23    Authorization Type BCBS    PT Start Time 0900    PT Stop Time 0944    PT Time Calculation (min) 44 min    Activity Tolerance Patient tolerated treatment well    Behavior During Therapy WFL for tasks assessed/performed             Past Medical History:  Diagnosis Date   Alcohol abuse    a. quit several years ago.   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   pacemaker insertion for tachy/brady syndrome   Spinal cord stimulator status    Spinal stenosis    Tobacco abuse    Past Surgical History:  Procedure Laterality Date   ANKLE SURGERY     ATRIAL FIBRILLATION ABLATION N/A 10/02/2020   Procedure: ATRIAL FIBRILLATION ABLATION;  Surgeon: Regan Lemming, MD;  Location: [facility];  Service: Cardiovascular;  Laterality: N/A;   CERVICAL CONE BIOPSY     CESAREAN SECTION     COLONOSCOPY     HERNIA REPAIR     KNEE SURGERY     x5   ORIF ANKLE FRACTURE Left 06/02/2018   Procedure: OPEN REDUCTION INTERNAL FIXATION (ORIF) ANKLE FRACTURE;  Surgeon: Toni Arthurs, MD;  Location: [facility];  Service: Orthopedics;  Laterality: Left;   ORIF ANKLE FRACTURE Left 08/26/2018   Procedure: Left ankle syndesmosis OPEN  REDUCTION INTERNAL FIXATION (ORIF)/ reconstruction of deltoid ligament;  Surgeon: Toni Arthurs, MD;  Location: [facility];  Service: Orthopedics;  Laterality: Left;   PACEMAKER IMPLANT N/A 08/05/2018   Procedure: PACEMAKER IMPLANT;  Surgeon: Regan Lemming, MD;  Location: [facility];  Service: Cardiovascular;  Laterality: N/A;   POLYPECTOMY     SPINAL CORD STIMULATOR INSERTION N/A 05/07/2016   Procedure: LUMBAR SPINAL CORD STIMULATOR INSERTION;  Surgeon: Venita Lick, MD;  Location: [facility];  Service: Orthopedics;  Laterality: N/A;   Patient Active Problem List   Diagnosis Date Noted   Tachy-brady syndrome (HCC) 08/05/2018   Closed fracture of left ankle 06/01/2018   Paroxysmal atrial fibrillation (HCC) 06/01/2018   Benign essential hypertension 06/01/2018   Atrial flutter (HCC) 06/01/2018   Near syncope 06/01/2018   Bradycardia    Posterior tibial tendon dysfunction (PTTD) of right lower extremity 03/15/2018   Primary localized osteoarthrosis of ankle and foot 01/21/2018   Long-term current use of opiate analgesic 11/10/2017   Therapeutic opioid induced constipation 10/27/2017   On long term drug therapy 10/14/2017   Chronic low back pain 10/14/2017   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) 10/14/2017   Chronic pain syndrome 10/14/2017   Pain in left foot 10/12/2017   Pain in right foot 10/12/2017   B12 deficiency 04/21/2016   Cobalamin deficiency 04/21/2016   Thrombocytopenia (HCC) 11/28/2015   Chronic pain 11/28/2015   Disease of jaw 11/28/2015   Hypokalemia 11/28/2015   Impaired fasting glucose 11/28/2015   Swelling of both lower extremities 11/28/2015   Tobacco abuse 11/28/2015   Vertigo 11/28/2015   Hereditary and idiopathic neuropathy 11/28/2015   Hereditary and idiopathic peripheral neuropathy 09/17/2009   Disorder of peripheral nervous system 09/17/2009   Alcoholic cirrhosis of liver (HCC) 10/02/2008   JAUNDICE 10/02/2008    Generalized abdominal pain 10/02/2008   Ascites 10/02/2008   Jaundice 10/02/2008   ESOPHAGEAL VARICES WITHOUT MENTION OF BLEEDING 04/04/2008   Esophageal varices (HCC) 04/04/2008   Alcohol abuse 03/16/2008   TRANSAMINASES, SERUM, ELEVATED 03/16/2008   Elevated levels of transaminase & lactic acid dehydrogenase 03/16/2008   SCARLET FEVER 03/14/2008   Hyperlipidemia 03/14/2008   External hemorrhoids 03/14/2008   Umbilical hernia 03/14/2008   Endometriosis 03/14/2008   DYSMENORRHEA 03/14/2008   INSOMNIA UNSPECIFIED 03/14/2008   Large liver 03/14/2008   Splenomegaly 03/14/2008   LIVER FUNCTION TESTS, ABNORMAL, HX OF 03/14/2008   ANAL FISSURE, HX OF 03/14/2008   Insomnia 03/14/2008   Allergic rhinitis 05/31/2007   Gastroesophageal reflux disease 05/31/2007   Osteoarthritis 05/31/2007    PCP: Richmond Campbell., PA-C   REFERRING PROVIDER: Pincus Badder, FNP   REFERRING DIAG: M54.50 (ICD-10-CM) - Low back pain, unspecified   Rationale for Evaluation and Treatment: Rehabilitation  THERAPY DIAG:  Other low back pain  Muscle weakness (generalized)  Other abnormalities of gait and mobility  Unsteadiness on feet  ONSET DATE: 7 YRS  SUBJECTIVE:                                                                                                                                                                                           SUBJECTIVE STATEMENT: Pt reports no new changes since evaluation.  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  Pt arrives to pool via [facility] transport. "I've tried everything, land PT, massage therapy, etc... this is the last thing for me to try for some relief".   From evaluation:  8/15 steroid injection/ Lumbar facet arthropathy.  Will be having an ablation in Oct.  Left ankle replacement, bilat knee OA.  My feet and knees turn out.  Toes in left foot no sensation. Use cane to stop from  falling. L4 ruptured disc 7  yrs ago wanted off of opioids so went with the stimulator.  Was unable to get off of opioids due to severity of pain. Back pain is high and constant.  Can't walk far. Being seen at [facility]   PERTINENT HISTORY:  Left ankle pain, S/P ankle replacement surgery At [facility] January 2023 Right wrist injury S/P pacemaker implant 2019 ,atrial flutter/AFib? Chronic low back pain, spinal cord stimulator 2017 Tobacco dependence UDS 03/21/2019, 02/22/20, 07/22/21, 08/11/22 Eliquis SOAPP score 5 Oswestry score 20%, 42% (04/07/2022) Pain agreement 08/11/22;02/02/23 Narcan RX back and left buttock pain; CT myelogram L3-4 spinal stenosis loss of spouse-January 2024  PAIN:  Are you having pain? Yes: NPRS scale:  5-6/10 Pain location: LB across L>R into hips Pain description: throbs with sitting; stabbing with walking Aggravating factors: Amb > 25 yds makes my eyes tear Relieving factors: opioids, lying down  PRECAUTIONS: Other: spinal cord stimulation  RED FLAGS: None   WEIGHT BEARING RESTRICTIONS: No  FALLS:  Has patient fallen in last 6 months? No  LIVING ENVIRONMENT: Lives with: lives with their daughter Lives in: House/apartment Stairs: Yes: Internal: 15 steps; can reach both Has following equipment at home: Single point cane, Wheelchair (manual), Shower bench, Grab bars, and lift chair  OCCUPATION: disabled  PLOF: Requires assistive device for independence  PATIENT GOALS: decrease muscle tightness in LB m;  NEXT MD VISIT: sept  OBJECTIVE:   DIAGNOSTIC FINDINGS:  01/02/23 CT Lumbar Spine IMPRESSION: 1. Progressive, advanced right-sided disc degeneration at L3-4 with a prominent right-sided disc osteophyte complex resulting in moderate spinal stenosis, moderate right lateral recess stenosis, and severe right neural foraminal stenosis. 2. Progressive, severe multilevel facet arthrosis. Grade 1 anterolisthesis develops with standing at L4-5. 3. New mild spinal  stenosis and mild right neural foraminal stenosis at L4-5. 4. New mild left neural foraminal stenosis at L5-S1. 5.  Aortic Atherosclerosis (ICD10-I70.0)  Left ankle 06/01/23 Impression:  1. Stable appearing left total ankle arthroplasty. 2. Stable midfoot fusion. 3. Diffuse demineralization. 4. Hindfoot valgus.   PATIENT SURVEYS:  FOTO risk adjusted 38% with goal of 50%   COGNITION: Overall cognitive status: Within functional limits for tasks assessed     SENSATION: IMPAIRED LEFT FOREFOOT/TOES  MUSCLE LENGTH: Hamstrings: tight   POSTURE:  bilat knee valgus deformity; bilat ankle inversion and pronation , decreased lumbar lordosis  PALPATION: No TTP in LB; bilat knee crepitus LUMBAR ROM:   AROM eval  Flexion FT to mid shin  Extension 25%P!  Right lateral flexion 25%P!  Left lateral flexion 50%P!  Right rotation   Left rotation    (Blank rows = not tested)  LOWER EXTREMITY ROM:     Active  Right eval Left eval  Hip flexion    Hip extension    Hip abduction    Hip adduction    Hip internal rotation    Hip external rotation    Knee flexion 120 120  Knee extension 0 passive 0 passive  Ankle dorsiflexion  2d  Ankle plantarflexion  21d  Ankle inversion    Ankle eversion     (Blank rows = not tested)  LOWER EXTREMITY MMT:    MMT Right eval Left eval  Hip flexion 36.6 39.8  Hip extension    Hip abduction 29.2 37.1  Hip adduction    Hip internal rotation    Hip external rotation    Knee flexion    Knee extension 40.6 34.6  Ankle dorsiflexion    Ankle plantarflexion    Ankle inversion  Ankle eversion     (Blank rows = not tested)  LUMBAR SPECIAL TESTS:  Straight leg raise test: Positive and Thomas test: Negative  FUNCTIONAL TESTS:  Timed up and go (TUG): 17.62   GAIT: Distance walked: 400 ft Assistive device utilized: Single point cane Level of assistance: Modified independence Comments: External hip rotation, feet in  pronation  TODAY'S TREATMENT:                                                                                                                              Pt seen for aquatic therapy today.  Treatment took place in water 3.5-4.75 ft in depth at the [facility] pool. Temp of water was 91.  Pt entered/exited the pool via stairs independently with bilat rail.  * Intro to aquatic therapy principles * Unsupported walking forward/ backward - then with added reciprocal arm swing * Straddling noodle:  cycling with breast stroke arms;  hip abdct/ addct (cues for more neutral hip); cross country ski * LE swings into hip flex/ext x 10; hip abdct/ addct x 10 * Return to walking   Pt requires the buoyancy and hydrostatic pressure of water for support, and to offload joints by unweighting joint load by at least 50 % in navel deep water and by at least 75-80% in chest to neck deep water.  Viscosity of the water is needed for resistance of strengthening. Water current perturbations provides challenge to standing balance requiring increased core activation.     PATIENT EDUCATION:  Education details: issued list of pools    Person educated: Patient Education method: Explanation Education comprehension: verbalized understanding  HOME EXERCISE PROGRAM: TBA  ASSESSMENT:  CLINICAL IMPRESSION: Pt is safe and independent in aquatic setting and able to take direction from therapist on deck.  She reports reduction of pain to 3-4/10 when submerged 70-80%. She requires mod cues for more upright trunk (flexed at hips); may try UE on floatation to assist in more vertical trunk with gait. Good tolerance for exercises; plan to progress as tolerated.  Goals are ongoing.    From evaluation:  Patient is a 62 y.o. f who was seen today for physical therapy evaluation and treatment for LBP.  She has a complicated Pmhx with multiple orthopedic implications; bil hip, knee and feet oa; Left total ankle replacement  with hindfoot valgus; Lumbar DDD, spinal stenosis, NFS, severe multilevel facet arthrosis. Grade 1 anterolisthesis L4-5.  She has been taking narcotics for years for pain control. Her main complaint today is back pain.  She is being seen at [facility] and is planning on a spinal nerve ablation in Oct. She presents with high pain sensitivity throughout using a cane for balance. She has significant postural dysfunction and muscle weakness, poor balance and gait deviation.  Due to the complicated nature of pts situation she will be seen in aquatics only to benefit from the properties of water to address all function, balance, strength and pain  then potentially transition to land based intervention. Plan to re-assess as needed to modify or adjust goals as pt progresses   OBJECTIVE IMPAIRMENTS: Abnormal gait, decreased activity tolerance, decreased balance, decreased coordination, decreased endurance, decreased mobility, difficulty walking, decreased ROM, decreased strength, impaired flexibility, impaired sensation, postural dysfunction, and pain.   ACTIVITY LIMITATIONS: carrying, lifting, bending, sitting, standing, squatting, stairs, transfers, locomotion level, and caring for others  PARTICIPATION LIMITATIONS: meal prep, cleaning, laundry, driving, shopping, community activity, occupation, and yard work  PERSONAL FACTORS: Fitness, Past/current experiences, Time since onset of injury/illness/exacerbation, and 3+ comorbidities: see impression statement  are also affecting patient's functional outcome.   REHAB POTENTIAL: Good  CLINICAL DECISION MAKING: Unstable/unpredictable  EVALUATION COMPLEXITY: High   GOALS: Goals reviewed with patient? Yes  SHORT TERM GOALS: Target date: 07/18/23  Pt will tolerate full aquatic sessions consistently without increase in pain and with improving function to demonstrate good toleration and effectiveness of intervention.  Baseline: Goal status: INITIAL  2.  Pt will  report decrease in usual pain by up to 2 NPRS for improved toleration to activity/quality of life and to demonstrate improved management of pain. Baseline: 8/10 Goal status: INITIAL  3.  Pt will tolerate walking to or from setting along with tolerating full aquatic session without significant increase in pain or fatigue Baseline:  Goal status: INITIAL  4.  Pt will improve on Tug test to <or= 14s to demonstrate improvement in lower extremity function, mobility and decreased fall risk. Baseline: 17.62 Goal status: INITIAL  5.  Pt will be able to ambulate submerged ue support as needed up to 10 consecutive minutes to demonstrate improving toleration to amb. Baseline:  Goal status: INITIAL    LONG TERM GOALS: Target date: 08/11/23  Pt to meet stated Foto Goal of 50% Baseline: 38% Goal status: INITIAL  2.  Pt will be indep with final HEP's (land and aquatic as appropriate) for continued management of condition  Baseline: none Goal status: INITIAL  3.  Pt will improve strength in all areas listed by  up to 10lbs to demonstrate improved overall physical function Baseline: see chart Goal status: INITIAL  4.  Pt will report decrease in worst pain to </= 6/10 for improved toleration to activity/quality of life and to demonstrate improved management of pain.  Baseline: 10/10 Goal status: INITIAL  5.  Pt will be able to amb to and from setting (400 - 500 ft each way) using AD as needed to demonstrate improved gait and endurance/aerobic capacity. Baseline: 75 ft Goal status: INITIAL  6.  Pt to improve lumbar ROM by 25% for improved function Baseline: see chart Goal status: INITIAL  PLAN:  PT FREQUENCY: 2x/week  PT DURATION: 10 weeks  PLANNED INTERVENTIONS: Therapeutic exercises, Therapeutic activity, Neuromuscular re-education, Balance training, Gait training, Patient/Family education, Self Care, Joint mobilization, Joint manipulation, Stair training, Orthotic/Fit training, DME  instructions, Aquatic Therapy, Dry Needling, Electrical stimulation, Spinal mobilization, Cryotherapy, Moist heat, Taping, Traction, Ionotophoresis 4mg /ml Dexamethasone, Manual therapy, and Re-evaluation.  PLAN FOR NEXT SESSION: aquatic: gait, endurance, strength and balance training, pain control.  Mayer Camel, PTA 06/05/23 11:49 AM [facility] [address] [place], [place], [address] Phone: [phone]   Fax:  [phone]

## 2023-06-11 ENCOUNTER — Ambulatory Visit: Payer: BC Managed Care – PPO | Admitting: Student

## 2023-06-12 ENCOUNTER — Ambulatory Visit (HOSPITAL_BASED_OUTPATIENT_CLINIC_OR_DEPARTMENT_OTHER): Payer: BC Managed Care – PPO | Attending: Family Medicine | Admitting: Physical Therapy

## 2023-06-12 DIAGNOSIS — M6281 Muscle weakness (generalized): Secondary | ICD-10-CM | POA: Insufficient documentation

## 2023-06-12 DIAGNOSIS — R2689 Other abnormalities of gait and mobility: Secondary | ICD-10-CM | POA: Insufficient documentation

## 2023-06-12 DIAGNOSIS — R2681 Unsteadiness on feet: Secondary | ICD-10-CM | POA: Diagnosis present

## 2023-06-12 DIAGNOSIS — M5459 Other low back pain: Secondary | ICD-10-CM | POA: Insufficient documentation

## 2023-06-12 NOTE — Therapy (Signed)
OUTPATIENT PHYSICAL THERAPY THORACOLUMBAR TREATMENT   Patient Name: [name] MRN: [id number] DOB:[birth year], 62 y.o., female Today's Date: 06/12/2023  END OF SESSION:  PT End of Session - 06/12/23 0918     Visit Number 3    Number of Visits 20    Date for PT Re-Evaluation 08/11/23    Authorization Type BCBS    PT Start Time 0900    PT Stop Time 0940    PT Time Calculation (min) 40 min    Activity Tolerance Patient tolerated treatment well    Behavior During Therapy WFL for tasks assessed/performed             Past Medical History:  Diagnosis Date   Alcohol abuse    a. quit several years ago.   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   pacemaker insertion for tachy/brady syndrome   Spinal cord stimulator status    Spinal stenosis    Tobacco abuse    Past Surgical History:  Procedure Laterality Date   ANKLE SURGERY     ATRIAL FIBRILLATION ABLATION N/A 10/02/2020   Procedure: ATRIAL FIBRILLATION ABLATION;  Surgeon: Regan Lemming, MD;  Location: [facility];  Service: Cardiovascular;  Laterality: N/A;   CERVICAL CONE BIOPSY     CESAREAN SECTION     COLONOSCOPY     HERNIA REPAIR     KNEE SURGERY     x5   ORIF ANKLE FRACTURE Left 06/02/2018   Procedure: OPEN REDUCTION INTERNAL FIXATION (ORIF) ANKLE FRACTURE;  Surgeon: Toni Arthurs, MD;  Location: [facility];  Service: Orthopedics;  Laterality: Left;   ORIF ANKLE FRACTURE Left 08/26/2018   Procedure: Left ankle syndesmosis OPEN  REDUCTION INTERNAL FIXATION (ORIF)/ reconstruction of deltoid ligament;  Surgeon: Toni Arthurs, MD;  Location: [facility];  Service: Orthopedics;  Laterality: Left;   PACEMAKER IMPLANT N/A 08/05/2018   Procedure: PACEMAKER IMPLANT;  Surgeon: Regan Lemming, MD;  Location: [facility];  Service: Cardiovascular;  Laterality: N/A;   POLYPECTOMY     SPINAL CORD STIMULATOR INSERTION N/A 05/07/2016   Procedure: LUMBAR SPINAL CORD STIMULATOR INSERTION;  Surgeon: Venita Lick, MD;  Location: [facility];  Service: Orthopedics;  Laterality: N/A;   Patient Active Problem List   Diagnosis Date Noted   Tachy-brady syndrome (HCC) 08/05/2018   Closed fracture of left ankle 06/01/2018   Paroxysmal atrial fibrillation (HCC) 06/01/2018   Benign essential hypertension 06/01/2018   Atrial flutter (HCC) 06/01/2018   Near syncope 06/01/2018   Bradycardia    Posterior tibial tendon dysfunction (PTTD) of right lower extremity 03/15/2018   Primary localized osteoarthrosis of ankle and foot 01/21/2018   Long-term current use of opiate analgesic 11/10/2017   Therapeutic opioid induced constipation 10/27/2017   On long term drug therapy 10/14/2017   Chronic low back pain 10/14/2017   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) 10/14/2017   Chronic pain syndrome 10/14/2017   Pain in left foot 10/12/2017   Pain in right foot 10/12/2017   B12 deficiency 04/21/2016   Cobalamin deficiency 04/21/2016   Thrombocytopenia (HCC) 11/28/2015   Chronic pain 11/28/2015   Disease of jaw 11/28/2015   Hypokalemia 11/28/2015   Impaired fasting glucose 11/28/2015   Swelling of both lower extremities 11/28/2015   Tobacco abuse 11/28/2015   Vertigo 11/28/2015   Hereditary and idiopathic neuropathy 11/28/2015   Hereditary and idiopathic peripheral neuropathy 09/17/2009   Disorder of peripheral nervous system 09/17/2009   Alcoholic cirrhosis of liver (HCC) 10/02/2008   JAUNDICE 10/02/2008    Generalized abdominal pain 10/02/2008   Ascites 10/02/2008   Jaundice 10/02/2008   ESOPHAGEAL VARICES WITHOUT MENTION OF BLEEDING 04/04/2008   Esophageal varices (HCC) 04/04/2008   Alcohol abuse 03/16/2008   TRANSAMINASES, SERUM, ELEVATED 03/16/2008   Elevated levels of transaminase & lactic acid dehydrogenase 03/16/2008   SCARLET FEVER 03/14/2008   Hyperlipidemia 03/14/2008   External hemorrhoids 03/14/2008   Umbilical hernia 03/14/2008   Endometriosis 03/14/2008   DYSMENORRHEA 03/14/2008   INSOMNIA UNSPECIFIED 03/14/2008   Large liver 03/14/2008   Splenomegaly 03/14/2008   LIVER FUNCTION TESTS, ABNORMAL, HX OF 03/14/2008   ANAL FISSURE, HX OF 03/14/2008   Insomnia 03/14/2008   Allergic rhinitis 05/31/2007   Gastroesophageal reflux disease 05/31/2007   Osteoarthritis 05/31/2007    PCP: Richmond Campbell., PA-C   REFERRING PROVIDER: Pincus Badder, FNP   REFERRING DIAG: M54.50 (ICD-10-CM) - Low back pain, unspecified   Rationale for Evaluation and Treatment: Rehabilitation  THERAPY DIAG:  Other low back pain  Muscle weakness (generalized)  Other abnormalities of gait and mobility  Unsteadiness on feet  ONSET DATE: 7 YRS  SUBJECTIVE:                                                                                                                                                                                           SUBJECTIVE STATEMENT: "I was sore in my hips, my ribs, my legs..."  "I'm still tender"   From evaluation:  8/15 steroid injection/ Lumbar facet arthropathy.  Will be having an ablation in Oct.  Left ankle replacement, bilat knee OA.  My feet and knees turn out.  Toes in left foot no sensation. Use cane to stop from falling. L4 ruptured disc 7 yrs ago wanted off of opioids so went with the stimulator.  Was unable to get off of opioids due to severity of pain. Back pain is high and constant.  Can't walk far. Being seen at [facility]   PERTINENT HISTORY:   Left  ankle pain, S/P ankle replacement surgery At [facility] January 2023 Right wrist injury S/P pacemaker implant 2019 ,atrial flutter/AFib? Chronic low back pain, spinal cord stimulator 2017 Tobacco dependence UDS 03/21/2019, 02/22/20, 07/22/21, 08/11/22 Eliquis SOAPP score 5 Oswestry score 20%, 42% (04/07/2022) Pain agreement 08/11/22;02/02/23 Narcan RX back and left buttock pain; CT myelogram L3-4 spinal stenosis loss of spouse-January 2024  PAIN:  Are you having pain? Yes: NPRS scale:  7/10 Pain location: LB across L>R into hips Pain description: tender Aggravating factors: Amb > 25 yds makes my eyes tear Relieving factors: opioids, lying down  PRECAUTIONS: Other: spinal cord stimulation  RED FLAGS: None   WEIGHT BEARING RESTRICTIONS: No  FALLS:  Has patient fallen in last 6 months? No  LIVING ENVIRONMENT: Lives with: lives with their daughter Lives in: House/apartment Stairs: Yes: Internal: 15 steps; can reach both Has following equipment at home: Single point cane, Wheelchair (manual), Shower bench, Grab bars, and lift chair  OCCUPATION: disabled  PLOF: Requires assistive device for independence  PATIENT GOALS: decrease muscle tightness in LB m;  NEXT MD VISIT: sept  OBJECTIVE:   DIAGNOSTIC FINDINGS:  01/02/23 CT Lumbar Spine IMPRESSION: 1. Progressive, advanced right-sided disc degeneration at L3-4 with a prominent right-sided disc osteophyte complex resulting in moderate spinal stenosis, moderate right lateral recess stenosis, and severe right neural foraminal stenosis. 2. Progressive, severe multilevel facet arthrosis. Grade 1 anterolisthesis develops with standing at L4-5. 3. New mild spinal stenosis and mild right neural foraminal stenosis at L4-5. 4. New mild left neural foraminal stenosis at L5-S1. 5.  Aortic Atherosclerosis (ICD10-I70.0)  Left ankle 06/01/23 Impression:  1. Stable appearing left total ankle arthroplasty. 2. Stable midfoot fusion. 3.  Diffuse demineralization. 4. Hindfoot valgus.   PATIENT SURVEYS:  FOTO risk adjusted 38% with goal of 50%   COGNITION: Overall cognitive status: Within functional limits for tasks assessed     SENSATION: IMPAIRED LEFT FOREFOOT/TOES  MUSCLE LENGTH: Hamstrings: tight   POSTURE:  bilat knee valgus deformity; bilat ankle inversion and pronation , decreased lumbar lordosis  PALPATION: No TTP in LB; bilat knee crepitus LUMBAR ROM:   AROM eval  Flexion FT to mid shin  Extension 25%P!  Right lateral flexion 25%P!  Left lateral flexion 50%P!  Right rotation   Left rotation    (Blank rows = not tested)  LOWER EXTREMITY ROM:     Active  Right eval Left eval  Hip flexion    Hip extension    Hip abduction    Hip adduction    Hip internal rotation    Hip external rotation    Knee flexion 120 120  Knee extension 0 passive 0 passive  Ankle dorsiflexion  2d  Ankle plantarflexion  21d  Ankle inversion    Ankle eversion     (Blank rows = not tested)  LOWER EXTREMITY MMT:    MMT Right eval Left eval  Hip flexion 36.6 39.8  Hip extension    Hip abduction 29.2 37.1  Hip adduction    Hip internal rotation    Hip external rotation    Knee flexion    Knee extension 40.6 34.6  Ankle dorsiflexion    Ankle plantarflexion    Ankle inversion    Ankle eversion     (Blank rows = not tested)  LUMBAR SPECIAL TESTS:  Straight leg raise test: Positive and Thomas test: Negative  FUNCTIONAL TESTS:  Timed up and go (TUG): 17.62   GAIT: Distance walked: 400 ft Assistive device utilized: Single  point cane Level of assistance: Modified independence Comments: External hip rotation, feet in pronation  TODAY'S TREATMENT:                                                                                                                              Pt seen for aquatic therapy today.  Treatment took place in water 3.5-4.75 ft in depth at the [facility] pool. Temp of water  was 91.  Pt entered/exited the pool via stairs independently with bilat rail.  * Unsupported walking forward/ backward  with cues for heel/toe pattern;  side stepping with arm addct/ abdct with cues for gentle IR of hip for more neutral foot position * holding wall: partial/full  heel raises x 10 * Straddling noodle:  cycling with breast stroke arms; reverse jumping jack (one episode of sharp pain to Lt low back) ; cross country ski * horiz decompression with yellow noodle under arms behind back and blue noodle at knees;  bad ragaz with gentle swaying motion for trunk side stretch and knees to chest for low back stretch * Return to walking   Pt requires the buoyancy and hydrostatic pressure of water for support, and to offload joints by unweighting joint load by at least 50 % in navel deep water and by at least 75-80% in chest to neck deep water.  Viscosity of the water is needed for resistance of strengthening. Water current perturbations provides challenge to standing balance requiring increased core activation.     PATIENT EDUCATION:  Education details:aquatic therapy exercise rationale, modifications, progressions   Person educated: Patient Education method: Explanation Education comprehension: verbalized understanding  HOME EXERCISE PROGRAM: TBA  ASSESSMENT:  CLINICAL IMPRESSION: Pt reported reduction of low back pain when sitting on noodle suspended and when in horiz decompression position. She did have one episode of sharp pain in Lt low back with hip abdct/arm addct when suspended on noodle. She required less cues for more upright trunk (flexed at hips). Plan to progress as tolerated.  Goals are ongoing.    From evaluation:  Patient is a 62 y.o. f who was seen today for physical therapy evaluation and treatment for LBP.  She has a complicated Pmhx with multiple orthopedic implications; bil hip, knee and feet oa; Left total ankle replacement with hindfoot valgus; Lumbar DDD,  spinal stenosis, NFS, severe multilevel facet arthrosis. Grade 1 anterolisthesis L4-5.  She has been taking narcotics for years for pain control. Her main complaint today is back pain.  She is being seen at [facility] and is planning on a spinal nerve ablation in Oct. She presents with high pain sensitivity throughout using a cane for balance. She has significant postural dysfunction and muscle weakness, poor balance and gait deviation.  Due to the complicated nature of pts situation she will be seen in aquatics only to benefit from the properties of water to address all function, balance, strength and pain then potentially transition to land based intervention.  Plan to re-assess as needed to modify or adjust goals as pt progresses   OBJECTIVE IMPAIRMENTS: Abnormal gait, decreased activity tolerance, decreased balance, decreased coordination, decreased endurance, decreased mobility, difficulty walking, decreased ROM, decreased strength, impaired flexibility, impaired sensation, postural dysfunction, and pain.   ACTIVITY LIMITATIONS: carrying, lifting, bending, sitting, standing, squatting, stairs, transfers, locomotion level, and caring for others  PARTICIPATION LIMITATIONS: meal prep, cleaning, laundry, driving, shopping, community activity, occupation, and yard work  PERSONAL FACTORS: Fitness, Past/current experiences, Time since onset of injury/illness/exacerbation, and 3+ comorbidities: see impression statement  are also affecting patient's functional outcome.   REHAB POTENTIAL: Good  CLINICAL DECISION MAKING: Unstable/unpredictable  EVALUATION COMPLEXITY: High   GOALS: Goals reviewed with patient? Yes  SHORT TERM GOALS: Target date: 07/18/23  Pt will tolerate full aquatic sessions consistently without increase in pain and with improving function to demonstrate good toleration and effectiveness of intervention.  Baseline: Goal status: INITIAL  2.  Pt will report decrease in usual pain by up  to 2 NPRS for improved toleration to activity/quality of life and to demonstrate improved management of pain. Baseline: 8/10 Goal status: INITIAL  3.  Pt will tolerate walking to or from setting along with tolerating full aquatic session without significant increase in pain or fatigue Baseline:  Goal status: INITIAL  4.  Pt will improve on Tug test to <or= 14s to demonstrate improvement in lower extremity function, mobility and decreased fall risk. Baseline: 17.62 Goal status: INITIAL  5.  Pt will be able to ambulate submerged ue support as needed up to 10 consecutive minutes to demonstrate improving toleration to amb. Baseline:  Goal status: INITIAL    LONG TERM GOALS: Target date: 08/11/23  Pt to meet stated Foto Goal of 50% Baseline: 38% Goal status: INITIAL  2.  Pt will be indep with final HEP's (land and aquatic as appropriate) for continued management of condition  Baseline: none Goal status: INITIAL  3.  Pt will improve strength in all areas listed by  up to 10lbs to demonstrate improved overall physical function Baseline: see chart Goal status: INITIAL  4.  Pt will report decrease in worst pain to </= 6/10 for improved toleration to activity/quality of life and to demonstrate improved management of pain.  Baseline: 10/10 Goal status: INITIAL  5.  Pt will be able to amb to and from setting (400 - 500 ft each way) using AD as needed to demonstrate improved gait and endurance/aerobic capacity. Baseline: 75 ft Goal status: INITIAL  6.  Pt to improve lumbar ROM by 25% for improved function Baseline: see chart Goal status: INITIAL  PLAN:  PT FREQUENCY: 2x/week  PT DURATION: 10 weeks  PLANNED INTERVENTIONS: Therapeutic exercises, Therapeutic activity, Neuromuscular re-education, Balance training, Gait training, Patient/Family education, Self Care, Joint mobilization, Joint manipulation, Stair training, Orthotic/Fit training, DME instructions, Aquatic Therapy, Dry  Needling, Electrical stimulation, Spinal mobilization, Cryotherapy, Moist heat, Taping, Traction, Ionotophoresis 4mg /ml Dexamethasone, Manual therapy, and Re-evaluation.  PLAN FOR NEXT SESSION: aquatic: gait, endurance, strength and balance training, pain control.  Mayer Camel, PTA 06/12/23 5:37 PM [facility] [address] [place], [place], [address] Phone: [phone]   Fax:  [phone]

## 2023-06-16 ENCOUNTER — Ambulatory Visit (HOSPITAL_BASED_OUTPATIENT_CLINIC_OR_DEPARTMENT_OTHER): Payer: BC Managed Care – PPO | Admitting: Physical Therapy

## 2023-06-16 ENCOUNTER — Encounter (HOSPITAL_BASED_OUTPATIENT_CLINIC_OR_DEPARTMENT_OTHER): Payer: Self-pay | Admitting: Physical Therapy

## 2023-06-16 DIAGNOSIS — M6281 Muscle weakness (generalized): Secondary | ICD-10-CM

## 2023-06-16 DIAGNOSIS — R2681 Unsteadiness on feet: Secondary | ICD-10-CM

## 2023-06-16 DIAGNOSIS — M5459 Other low back pain: Secondary | ICD-10-CM | POA: Diagnosis not present

## 2023-06-16 DIAGNOSIS — R2689 Other abnormalities of gait and mobility: Secondary | ICD-10-CM

## 2023-06-16 NOTE — Therapy (Signed)
OUTPATIENT PHYSICAL THERAPY THORACOLUMBAR TREATMENT   Patient Name: [name] MRN: [id number] DOB:Oct 03, 1961, 62 y.o., female Today's Date: 06/16/2023  END OF SESSION:  PT End of Session - 06/16/23 0909     Visit Number 4    Number of Visits 20    Date for PT Re-Evaluation 08/11/23    Authorization Type BC[employer]    PT Start Time 0900    PT Stop Time 0940    PT Time Calculation (min) 40 min    Behavior During Therapy WFL for tasks assessed/performed             Past Medical History:  Diagnosis Date   Alcohol abuse    a. quit several years ago.   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   pacemaker insertion for tachy/brady syndrome   Spinal cord stimulator status    Spinal stenosis    Tobacco abuse    Past Surgical History:  Procedure Laterality Date   ANKLE SURGERY     ATRIAL FIBRILLATION ABLATION N/A 10/02/2020   Procedure: ATRIAL FIBRILLATION ABLATION;  Surgeon: Regan Lemming, MD;  Location: [facility];  Service: Cardiovascular;  Laterality: N/A;   CERVICAL CONE BIOPSY     CESAREAN SECTION     COLONOSCOPY     HERNIA REPAIR     KNEE SURGERY     x5   ORIF ANKLE FRACTURE Left 06/02/2018   Procedure: OPEN REDUCTION INTERNAL FIXATION (ORIF) ANKLE FRACTURE;  Surgeon: Toni Arthurs, MD;  Location: [facility];  Service: Orthopedics;  Laterality: Left;   ORIF ANKLE FRACTURE Left 08/26/2018   Procedure: Left ankle syndesmosis OPEN REDUCTION INTERNAL FIXATION (ORIF)/ reconstruction of  deltoid ligament;  Surgeon: Toni Arthurs, MD;  Location: [facility];  Service: Orthopedics;  Laterality: Left;   PACEMAKER IMPLANT N/A 08/05/2018   Procedure: PACEMAKER IMPLANT;  Surgeon: Regan Lemming, MD;  Location: [facility];  Service: Cardiovascular;  Laterality: N/A;   POLYPECTOMY     SPINAL CORD STIMULATOR INSERTION N/A 05/07/2016   Procedure: LUMBAR SPINAL CORD STIMULATOR INSERTION;  Surgeon: Venita Lick, MD;  Location: [facility];  Service: Orthopedics;  Laterality: N/A;   Patient Active Problem List   Diagnosis Date Noted   Tachy-brady syndrome (HCC) 08/05/2018   Closed fracture of left ankle 06/01/2018   Paroxysmal atrial fibrillation (HCC) 06/01/2018   Benign essential hypertension 06/01/2018   Atrial flutter (HCC) 06/01/2018   Near syncope 06/01/2018   Bradycardia    Posterior tibial tendon dysfunction (PTTD) of right lower extremity 03/15/2018   Primary localized osteoarthrosis of ankle and foot 01/21/2018   Long-term current use of opiate analgesic 11/10/2017   Therapeutic opioid induced constipation 10/27/2017   On long term drug therapy 10/14/2017   Chronic low back pain 10/14/2017   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   Chronic pain syndrome 10/14/2017  Pain in left foot 10/12/2017   Pain in right foot 10/12/2017   B12 deficiency 04/21/2016   Cobalamin deficiency 04/21/2016   Thrombocytopenia (HCC) 11/28/2015   Chronic pain 11/28/2015   Disease of jaw 11/28/2015   Hypokalemia 11/28/2015   Impaired fasting glucose 11/28/2015   Swelling of both lower extremities 11/28/2015   Tobacco abuse 11/28/2015   Vertigo 11/28/2015   Hereditary and idiopathic neuropathy 11/28/2015   Hereditary and idiopathic peripheral neuropathy 09/17/2009   Disorder of peripheral nervous system 09/17/2009   Alcoholic cirrhosis of liver (HCC) 10/02/2008   JAUNDICE 10/02/2008   Generalized abdominal pain 10/02/2008   Ascites  10/02/2008   Jaundice 10/02/2008   ESOPHAGEAL VARICES WITHOUT MENTION OF BLEEDING 04/04/2008   Esophageal varices (HCC) 04/04/2008   Alcohol abuse 03/16/2008   TRANSAMINASES, SERUM, ELEVATED 03/16/2008   Elevated levels of transaminase & lactic acid dehydrogenase 03/16/2008   SCARLET FEVER 03/14/2008   Hyperlipidemia 03/14/2008   External hemorrhoids 03/14/2008   Umbilical hernia 03/14/2008   Endometriosis 03/14/2008   DYSMENORRHEA 03/14/2008   INSOMNIA UNSPECIFIED 03/14/2008   Large liver 03/14/2008   Splenomegaly 03/14/2008   LIVER FUNCTION TESTS, ABNORMAL, HX OF 03/14/2008   ANAL FISSURE, HX OF 03/14/2008   Insomnia 03/14/2008   Allergic rhinitis 05/31/2007   Gastroesophageal reflux disease 05/31/2007   Osteoarthritis 05/31/2007    PCP: Richmond Campbell., PA-C   REFERRING PROVIDER: Pincus Badder, FNP   REFERRING DIAG: M54.50 (ICD-10-CM) - Low back pain, unspecified   Rationale for Evaluation and Treatment: Rehabilitation  THERAPY DIAG:  Other low back pain  Muscle weakness (generalized)  Other abnormalities of gait and mobility  Unsteadiness on feet  ONSET DATE: 7 YRS  SUBJECTIVE:                                                                                                                                                                                           SUBJECTIVE STATEMENT: "I am sore in my Left wrist and rib area.  My ankles were sore after those heel raises".   "I was surprised at how much it wears you out".   From evaluation:  8/15 steroid injection/ Lumbar facet arthropathy.  Will be having an ablation in Oct.  Left ankle replacement, bilat knee OA.  My feet and knees turn out.  Toes in left foot no sensation. Use cane to stop from falling. L4 ruptured disc 7 yrs ago wanted off of opioids so went with the stimulator.  Was unable to get off of opioids due to severity of pain. Back pain is high and constant.  Can't walk far. Being seen at  [facility]   PERTINENT HISTORY:  Left ankle pain, S/P ankle replacement surgery At [facility] January 2023 Right wrist injury S/P pacemaker implant 2019 ,atrial flutter/AFib? Chronic low back pain, spinal cord stimulator 2017 Tobacco dependence UDS 03/21/2019, 02/22/20, 07/22/21, 08/11/22 Eliquis SOAPP score 5 Oswestry score 20%, 42% (04/07/2022) Pain agreement 08/11/22;02/02/23 Narcan RX back and left buttock pain; CT myelogram L3-4 spinal stenosis loss of spouse-January 2024  PAIN:  Are you having pain? Yes: NPRS scale:  6/10 Pain location: LB across L>R into hips Pain description: tender Aggravating factors: Amb > 25 yds makes my eyes tear Relieving factors: opioids, lying down  PRECAUTIONS: Other: spinal cord stimulation  RED FLAGS: None   WEIGHT BEARING RESTRICTIONS: No  FALLS:  Has patient fallen in last 6 months? No  LIVING ENVIRONMENT: Lives with: lives with their daughter Lives in: House/apartment Stairs: Yes: Internal: 15 steps; can reach both Has following equipment at home: Single point cane, Wheelchair (manual), Shower bench, Grab bars, and lift chair  OCCUPATION: disabled  PLOF: Requires assistive device for independence  PATIENT GOALS: decrease muscle tightness in LB m;  NEXT MD VISIT: sept  OBJECTIVE:   DIAGNOSTIC FINDINGS:  01/02/23 CT Lumbar Spine IMPRESSION: 1. Progressive, advanced right-sided disc degeneration at L3-4 with a prominent right-sided disc osteophyte complex resulting in moderate spinal stenosis, moderate right lateral recess stenosis, and severe right neural foraminal stenosis. 2. Progressive, severe multilevel facet arthrosis. Grade 1 anterolisthesis develops with standing at L4-5. 3. New mild spinal stenosis and mild right neural foraminal stenosis at L4-5. 4. New mild left neural foraminal stenosis at L5-S1. 5.  Aortic Atherosclerosis (ICD10-I70.0)  Left ankle 06/01/23 Impression:  1. Stable appearing left total ankle  arthroplasty. 2. Stable midfoot fusion. 3. Diffuse demineralization. 4. Hindfoot valgus.   PATIENT SURVEYS:  FOTO risk adjusted 38% with goal of 50%   COGNITION: Overall cognitive status: Within functional limits for tasks assessed     SENSATION: IMPAIRED LEFT FOREFOOT/TOES  MUSCLE LENGTH: Hamstrings: tight   POSTURE:  bilat knee valgus deformity; bilat ankle inversion and pronation , decreased lumbar lordosis  PALPATION: No TTP in LB; bilat knee crepitus LUMBAR ROM:   AROM eval  Flexion FT to mid shin  Extension 25%P!  Right lateral flexion 25%P!  Left lateral flexion 50%P!  Right rotation   Left rotation    (Blank rows = not tested)  LOWER EXTREMITY ROM:     Active  Right eval Left eval  Hip flexion    Hip extension    Hip abduction    Hip adduction    Hip internal rotation    Hip external rotation    Knee flexion 120 120  Knee extension 0 passive 0 passive  Ankle dorsiflexion  2d  Ankle plantarflexion  21d  Ankle inversion    Ankle eversion     (Blank rows = not tested)  LOWER EXTREMITY MMT:    MMT Right eval Left eval  Hip flexion 36.6 39.8  Hip extension    Hip abduction 29.2 37.1  Hip adduction    Hip internal rotation    Hip external rotation    Knee flexion    Knee extension 40.6 34.6  Ankle dorsiflexion    Ankle plantarflexion    Ankle inversion    Ankle eversion     (Blank rows = not tested)  LUMBAR SPECIAL TESTS:  Straight leg raise test: Positive and Thomas test: Negative  FUNCTIONAL TESTS:  Timed up and go (TUG): 17.62   GAIT: Distance  walked: 400 ft Assistive device utilized: Single point cane Level of assistance: Modified independence Comments: External hip rotation, feet in pronation  TODAY'S TREATMENT:                                                                                                                              Pt seen for aquatic therapy today.  Treatment took place in water 3.5-4.75 ft in depth at  the [facility] pool. Temp of water was 91.  Pt entered/exited the pool via stairs independently with bilat rail.  * Unsupported walking forward/ backward  with cues for heel/toe pattern;  side stepping with arm addct/ abdct  * standard stance with TrA set with short hollow noodle pulled down to thighs x 10 * standard and staggered stance with tricep push down with rainbow hand floats (~5 of each) * back float for rest / recovery * farmer carry with short hollow noodle at side, walking forward/ backward in deepest water * with blue hollow noodle: quad stretch (unable to tolerate pressure on Rt ankle);  3 way LE stretch x 10s each position * Straddling noodle:  cycling with breast stroke arms  Pt requires the buoyancy and hydrostatic pressure of water for support, and to offload joints by unweighting joint load by at least 50 % in navel deep water and by at least 75-80% in chest to neck deep water.  Viscosity of the water is needed for resistance of strengthening. Water current perturbations provides challenge to standing balance requiring increased core activation.     PATIENT EDUCATION:  Education details:aquatic therapy exercise rationale, modifications, progressions   Person educated: Patient Education method: Explanation Education comprehension: verbalized understanding  HOME EXERCISE PROGRAM: TBA  ASSESSMENT:  CLINICAL IMPRESSION: Pt reported reduction of low back pain to 4/10 initially during session (pt utilized hot tub prior to start of session).  Pain in low back increased to 6/10 after trial of supported LE stretches with hollow noodle. Pain reduced again with suspended cycling.  May try LE stretches on solid surface instead of noodle last time, as this irritated her back.  Plan to progress as tolerated.  Goals are ongoing.    From evaluation:  Patient is a 62 y.o. f who was seen today for physical therapy evaluation and treatment for LBP.  She has a complicated  Pmhx with multiple orthopedic implications; bil hip, knee and feet oa; Left total ankle replacement with hindfoot valgus; Lumbar DDD, spinal stenosis, NFS, severe multilevel facet arthrosis. Grade 1 anterolisthesis L4-5.  She has been taking narcotics for years for pain control. Her main complaint today is back pain.  She is being seen at [facility] and is planning on a spinal nerve ablation in Oct. She presents with high pain sensitivity throughout using a cane for balance. She has significant postural dysfunction and muscle weakness, poor balance and gait deviation.  Due to the complicated nature of pts situation she will be seen in aquatics  only to benefit from the properties of water to address all function, balance, strength and pain then potentially transition to land based intervention. Plan to re-assess as needed to modify or adjust goals as pt progresses   OBJECTIVE IMPAIRMENTS: Abnormal gait, decreased activity tolerance, decreased balance, decreased coordination, decreased endurance, decreased mobility, difficulty walking, decreased ROM, decreased strength, impaired flexibility, impaired sensation, postural dysfunction, and pain.   ACTIVITY LIMITATIONS: carrying, lifting, bending, sitting, standing, squatting, stairs, transfers, locomotion level, and caring for others  PARTICIPATION LIMITATIONS: meal prep, cleaning, laundry, driving, shopping, community activity, occupation, and yard work  PERSONAL FACTORS: Fitness, Past/current experiences, Time since onset of injury/illness/exacerbation, and 3+ comorbidities: see impression statement  are also affecting patient's functional outcome.   REHAB POTENTIAL: Good  CLINICAL DECISION MAKING: Unstable/unpredictable  EVALUATION COMPLEXITY: High   GOALS: Goals reviewed with patient? Yes  SHORT TERM GOALS: Target date: 07/18/23  Pt will tolerate full aquatic sessions consistently without increase in pain and with improving function to demonstrate  good toleration and effectiveness of intervention.  Baseline: Goal status: INITIAL  2.  Pt will report decrease in usual pain by up to 2 NPRS for improved toleration to activity/quality of life and to demonstrate improved management of pain. Baseline: 8/10 Goal status: INITIAL  3.  Pt will tolerate walking to or from setting along with tolerating full aquatic session without significant increase in pain or fatigue Baseline:  Goal status: INITIAL  4.  Pt will improve on Tug test to <or= 14s to demonstrate improvement in lower extremity function, mobility and decreased fall risk. Baseline: 17.62 Goal status: INITIAL  5.  Pt will be able to ambulate submerged ue support as needed up to 10 consecutive minutes to demonstrate improving toleration to amb. Baseline:  Goal status: INITIAL    LONG TERM GOALS: Target date: 08/11/23  Pt to meet stated Foto Goal of 50% Baseline: 38% Goal status: INITIAL  2.  Pt will be indep with final HEP's (land and aquatic as appropriate) for continued management of condition  Baseline: none Goal status: INITIAL  3.  Pt will improve strength in all areas listed by  up to 10lbs to demonstrate improved overall physical function Baseline: see chart Goal status: INITIAL  4.  Pt will report decrease in worst pain to </= 6/10 for improved toleration to activity/quality of life and to demonstrate improved management of pain.  Baseline: 10/10 Goal status: INITIAL  5.  Pt will be able to amb to and from setting (400 - 500 ft each way) using AD as needed to demonstrate improved gait and endurance/aerobic capacity. Baseline: 75 ft Goal status: INITIAL  6.  Pt to improve lumbar ROM by 25% for improved function Baseline: see chart Goal status: INITIAL  PLAN:  PT FREQUENCY: 2x/week  PT DURATION: 10 weeks  PLANNED INTERVENTIONS: Therapeutic exercises, Therapeutic activity, Neuromuscular re-education, Balance training, Gait training, Patient/Family  education, Self Care, Joint mobilization, Joint manipulation, Stair training, Orthotic/Fit training, DME instructions, Aquatic Therapy, Dry Needling, Electrical stimulation, Spinal mobilization, Cryotherapy, Moist heat, Taping, Traction, Ionotophoresis 4mg /ml Dexamethasone, Manual therapy, and Re-evaluation.  PLAN FOR NEXT SESSION: aquatic: gait, endurance, strength and balance training, pain control.  Mayer Camel, PTA 06/16/23 9:44 AM [facility] [address] [place], [place], [address] Phone: [phone]   Fax:  [phone]

## 2023-06-18 ENCOUNTER — Ambulatory Visit (HOSPITAL_BASED_OUTPATIENT_CLINIC_OR_DEPARTMENT_OTHER): Payer: BC Managed Care – PPO | Admitting: Physical Therapy

## 2023-06-18 ENCOUNTER — Encounter (HOSPITAL_BASED_OUTPATIENT_CLINIC_OR_DEPARTMENT_OTHER): Payer: Self-pay | Admitting: Physical Therapy

## 2023-06-18 DIAGNOSIS — R2689 Other abnormalities of gait and mobility: Secondary | ICD-10-CM

## 2023-06-18 DIAGNOSIS — M6281 Muscle weakness (generalized): Secondary | ICD-10-CM

## 2023-06-18 DIAGNOSIS — M5459 Other low back pain: Secondary | ICD-10-CM | POA: Diagnosis not present

## 2023-06-18 DIAGNOSIS — R2681 Unsteadiness on feet: Secondary | ICD-10-CM

## 2023-06-18 NOTE — Therapy (Signed)
OUTPATIENT PHYSICAL THERAPY THORACOLUMBAR TREATMENT   Patient Name: [name] MRN: [id number] DOB:[birth year], 62 y.o., female Today's Date: 06/18/2023  END OF SESSION:  PT End of Session - 06/18/23 1036     Visit Number 5    Number of Visits 20    Date for PT Re-Evaluation 08/11/23    Authorization Type BCBS    PT Start Time 1024    PT Stop Time 1108    PT Time Calculation (min) 44 min             Past Medical History:  Diagnosis Date   Alcohol abuse    a. quit several years ago.   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   pacemaker insertion for tachy/brady syndrome   Spinal cord stimulator status    Spinal stenosis    Tobacco abuse    Past Surgical History:  Procedure Laterality Date   ANKLE SURGERY     ATRIAL FIBRILLATION ABLATION N/A 10/02/2020   Procedure: ATRIAL FIBRILLATION ABLATION;  Surgeon: Regan Lemming, MD;  Location: [facility];  Service: Cardiovascular;  Laterality: N/A;   CERVICAL CONE BIOPSY     CESAREAN SECTION     COLONOSCOPY     HERNIA REPAIR     KNEE SURGERY     x5   ORIF ANKLE FRACTURE Left 06/02/2018   Procedure: OPEN REDUCTION INTERNAL FIXATION (ORIF) ANKLE FRACTURE;  Surgeon: Toni Arthurs, MD;  Location: [facility];  Service: Orthopedics;  Laterality: Left;   ORIF ANKLE FRACTURE Left 08/26/2018   Procedure: Left ankle syndesmosis OPEN REDUCTION INTERNAL FIXATION (ORIF)/ reconstruction of deltoid ligament;  Surgeon: Toni Arthurs, MD;  Location: [facility];  Service: Orthopedics;  Laterality: Left;   PACEMAKER IMPLANT N/A 08/05/2018   Procedure: PACEMAKER IMPLANT;  Surgeon: Regan Lemming, MD;  Location: [facility];  Service: Cardiovascular;  Laterality: N/A;   POLYPECTOMY     SPINAL CORD STIMULATOR INSERTION N/A 05/07/2016   Procedure: LUMBAR SPINAL CORD STIMULATOR INSERTION;  Surgeon: Venita Lick, MD;  Location: [facility];  Service: Orthopedics;  Laterality: N/A;   Patient Active Problem List   Diagnosis Date Noted   Tachy-brady syndrome (HCC) 08/05/2018   Closed fracture of left ankle 06/01/2018   Paroxysmal atrial fibrillation (HCC) 06/01/2018   Benign essential hypertension 06/01/2018   Atrial flutter (HCC) 06/01/2018   Near syncope 06/01/2018   Bradycardia    Posterior tibial tendon dysfunction (PTTD) of right lower extremity 03/15/2018   Primary localized osteoarthrosis of ankle and foot 01/21/2018   Long-term current use of opiate analgesic 11/10/2017   Therapeutic opioid induced constipation 10/27/2017   On long term drug therapy 10/14/2017   Chronic low back pain 10/14/2017   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   Chronic pain syndrome 10/14/2017   Pain in left foot 10/12/2017   Pain in  right foot 10/12/2017   B12 deficiency 04/21/2016   Cobalamin deficiency 04/21/2016   Thrombocytopenia (HCC) 11/28/2015   Chronic pain 11/28/2015   Disease of jaw 11/28/2015   Hypokalemia 11/28/2015   Impaired fasting glucose 11/28/2015   Swelling of both lower extremities 11/28/2015   Tobacco abuse 11/28/2015   Vertigo 11/28/2015   Hereditary and idiopathic neuropathy 11/28/2015   Hereditary and idiopathic peripheral neuropathy 09/17/2009   Disorder of peripheral nervous system 09/17/2009   Alcoholic cirrhosis of liver (HCC) 10/02/2008   JAUNDICE 10/02/2008   Generalized abdominal pain 10/02/2008   Ascites 10/02/2008   Jaundice 10/02/2008   ESOPHAGEAL VARICES WITHOUT MENTION  OF BLEEDING 04/04/2008   Esophageal varices (HCC) 04/04/2008   Alcohol abuse 03/16/2008   TRANSAMINASES, SERUM, ELEVATED 03/16/2008   Elevated levels of transaminase & lactic acid dehydrogenase 03/16/2008   SCARLET FEVER 03/14/2008   Hyperlipidemia 03/14/2008   External hemorrhoids 03/14/2008   Umbilical hernia 03/14/2008   Endometriosis 03/14/2008   DYSMENORRHEA 03/14/2008   INSOMNIA UNSPECIFIED 03/14/2008   Large liver 03/14/2008   Splenomegaly 03/14/2008   LIVER FUNCTION TESTS, ABNORMAL, HX OF 03/14/2008   ANAL FISSURE, HX OF 03/14/2008   Insomnia 03/14/2008   Allergic rhinitis 05/31/2007   Gastroesophageal reflux disease 05/31/2007   Osteoarthritis 05/31/2007    PCP: Richmond Campbell., PA-C   REFERRING PROVIDER: Pincus Badder, FNP   REFERRING DIAG: M54.50 (ICD-10-CM) - Low back pain, unspecified   Rationale for Evaluation and Treatment: Rehabilitation  THERAPY DIAG:  Other low back pain  Muscle weakness (generalized)  Other abnormalities of gait and mobility  Unsteadiness on feet  ONSET DATE: 7 YRS  SUBJECTIVE:                                                                                                                                                                                           SUBJECTIVE STATEMENT: "I am sore in my rib area again."  "Today is a good day".   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   From evaluation:  8/15 steroid injection/ Lumbar facet arthropathy.  Will be having an ablation in Oct.  Left ankle replacement, bilat knee OA.  My feet and knees turn out.  Toes in left foot no sensation. Use cane to stop from falling. L4 ruptured disc 7 yrs ago wanted off of opioids so went with the stimulator.  Was unable to get off of opioids due to severity of pain. Back pain is high and constant.  Can't walk far. Being seen at [facility]   PERTINENT HISTORY:  Left ankle pain, S/P ankle replacement surgery [facility] January 2023 Right  wrist injury S/P pacemaker implant 2019 ,atrial flutter/AFib? Chronic low back pain, spinal cord stimulator 2017 Tobacco dependence UDS 03/21/2019, 02/22/20, 07/22/21, 08/11/22 Eliquis SOAPP score 5 Oswestry score 20%, 42% (04/07/2022) Pain agreement 08/11/22;02/02/23 Narcan RX back and left buttock pain; CT myelogram L3-4 spinal stenosis loss of spouse-January 2024  PAIN:  Are you having pain? Yes: NPRS scale:  6/10 Pain location: LB across L>R into hips Pain description: tender Aggravating factors: Amb > 25 yds makes my eyes tear Relieving factors: opioids, lying down  PRECAUTIONS: Other: spinal cord stimulation  RED FLAGS: None   WEIGHT BEARING RESTRICTIONS: No  FALLS:  Has patient fallen in last 6 months? No  LIVING ENVIRONMENT: Lives with: lives with their daughter Lives in: House/apartment Stairs: Yes: Internal: 15 steps; can reach both Has following equipment at home: Single point cane, Wheelchair (manual), Shower bench, Grab bars, and lift chair  OCCUPATION: disabled  PLOF: Requires assistive device for independence  PATIENT GOALS: decrease muscle tightness in LB m;  NEXT MD VISIT: sept  OBJECTIVE:   DIAGNOSTIC FINDINGS:  01/02/23 CT Lumbar Spine IMPRESSION: 1. Progressive, advanced right-sided disc degeneration at L3-4 with a prominent right-sided disc osteophyte complex resulting in moderate spinal stenosis, moderate right lateral recess stenosis, and severe right neural foraminal stenosis. 2. Progressive, severe multilevel facet arthrosis. Grade 1 anterolisthesis develops with standing at L4-5. 3. New mild spinal stenosis and mild right neural foraminal stenosis at L4-5. 4. New mild left neural foraminal stenosis at L5-S1. 5.  Aortic Atherosclerosis (ICD10-I70.0)  Left ankle 06/01/23 Impression:  1. Stable appearing left total ankle arthroplasty. 2. Stable midfoot fusion. 3. Diffuse demineralization. 4. Hindfoot valgus.   PATIENT SURVEYS:  FOTO  risk adjusted 38% with goal of 50%   COGNITION: Overall cognitive status: Within functional limits for tasks assessed     SENSATION: IMPAIRED LEFT FOREFOOT/TOES  MUSCLE LENGTH: Hamstrings: tight   POSTURE:  bilat knee valgus deformity; bilat ankle inversion and pronation , decreased lumbar lordosis  PALPATION: No TTP in LB; bilat knee crepitus LUMBAR ROM:   AROM eval  Flexion FT to mid shin  Extension 25%P!  Right lateral flexion 25%P!  Left lateral flexion 50%P!  Right rotation   Left rotation    (Blank rows = not tested)  LOWER EXTREMITY ROM:     Active  Right eval Left eval  Hip flexion    Hip extension    Hip abduction    Hip adduction    Hip internal rotation    Hip external rotation    Knee flexion 120 120  Knee extension 0 passive 0 passive  Ankle dorsiflexion  2d  Ankle plantarflexion  21d  Ankle inversion    Ankle eversion     (Blank rows = not tested)  LOWER EXTREMITY MMT:    MMT Right eval Left eval  Hip flexion 36.6 39.8  Hip extension    Hip abduction 29.2 37.1  Hip adduction    Hip internal rotation    Hip external rotation    Knee flexion    Knee extension 40.6 34.6  Ankle dorsiflexion    Ankle plantarflexion    Ankle inversion    Ankle eversion     (Blank rows = not tested)  LUMBAR SPECIAL TESTS:  Straight leg raise test: Positive and Thomas test: Negative  FUNCTIONAL TESTS:  Timed up and go (TUG): 17.62   GAIT: Distance walked: 400 ft Assistive device utilized: Single point cane Level of assistance: Modified independence Comments: External hip rotation,  feet in pronation  TODAY'S TREATMENT:                                                                                                                              Pt seen for aquatic therapy today.  Treatment took place in water 3.5-4.75 ft in depth at the [facility] pool. Temp of water was 91.  Pt entered/exited the pool via stairs independently with bilat  rail.  * Unsupported walking forward/ backward  with cues;  side stepping with arm addct/ abdct - multiple laps of each; back float for rest * staggered stance with TrA set with short hollow noodle pulled down to thighs x 10;  * standard stance with tricep push down with yellow hand floats x 10 * farmer carry with yellow hand floats at side, walking forward/ backward in deepest water * back float for rest / recovery * plank position/ superman position with yellow hand floats in deep and shallow water x 10 * switched to rainbow hand floats  -prone float to knee tuck x 5; front to back pendulums x 6 * Straddling noodle:  cycling with breast stroke arms * R/L quad stretch with foot on 2nd step, 15s x 2 each LE  Pt requires the buoyancy and hydrostatic pressure of water for support, and to offload joints by unweighting joint load by at least 50 % in navel deep water and by at least 75-80% in chest to neck deep water.  Viscosity of the water is needed for resistance of strengthening. Water current perturbations provides challenge to standing balance requiring increased core activation.     PATIENT EDUCATION:  Education details:aquatic therapy exercise rationale, modifications, progressions   Person educated: Patient Education method: Explanation Education comprehension: verbalized understanding  HOME EXERCISE PROGRAM: TBA  ASSESSMENT:  CLINICAL IMPRESSION: Pt tolerated exercises during session well, but reported increased tenderness in back after session. Good tolerance for quad stretch with foot on step behind her. Discussed working on walking to/from pool area, as she wouldn't have WC transport to pool if she was a member. Will plan to progress as tolerated.  Goals are ongoing.    From evaluation:  Patient is a 62 y.o. f who was seen today for physical therapy evaluation and treatment for LBP.  She has a complicated Pmhx with multiple orthopedic implications; bil hip, knee and feet oa;  Left total ankle replacement with hindfoot valgus; Lumbar DDD, spinal stenosis, NFS, severe multilevel facet arthrosis. Grade 1 anterolisthesis L4-5.  She has been taking narcotics for years for pain control. Her main complaint today is back pain.  She is being seen at [facility] and is planning on a spinal nerve ablation in Oct. She presents with high pain sensitivity throughout using a cane for balance. She has significant postural dysfunction and muscle weakness, poor balance and gait deviation.  Due to the complicated nature of pts situation she will be seen in aquatics only to benefit from the  properties of water to address all function, balance, strength and pain then potentially transition to land based intervention. Plan to re-assess as needed to modify or adjust goals as pt progresses   OBJECTIVE IMPAIRMENTS: Abnormal gait, decreased activity tolerance, decreased balance, decreased coordination, decreased endurance, decreased mobility, difficulty walking, decreased ROM, decreased strength, impaired flexibility, impaired sensation, postural dysfunction, and pain.   ACTIVITY LIMITATIONS: carrying, lifting, bending, sitting, standing, squatting, stairs, transfers, locomotion level, and caring for others  PARTICIPATION LIMITATIONS: meal prep, cleaning, laundry, driving, shopping, community activity, occupation, and yard work  PERSONAL FACTORS: Fitness, Past/current experiences, Time since onset of injury/illness/exacerbation, and 3+ comorbidities: see impression statement  are also affecting patient's functional outcome.   REHAB POTENTIAL: Good  CLINICAL DECISION MAKING: Unstable/unpredictable  EVALUATION COMPLEXITY: High   GOALS: Goals reviewed with patient? Yes  SHORT TERM GOALS: Target date: 07/18/23  Pt will tolerate full aquatic sessions consistently without increase in pain and with improving function to demonstrate good toleration and effectiveness of intervention.  Baseline: Goal  status: INITIAL  2.  Pt will report decrease in usual pain by up to 2 NPRS for improved toleration to activity/quality of life and to demonstrate improved management of pain. Baseline: 8/10 Goal status: INITIAL  3.  Pt will tolerate walking to or from setting along with tolerating full aquatic session without significant increase in pain or fatigue Baseline:  Goal status: INITIAL  4.  Pt will improve on Tug test to <or= 14s to demonstrate improvement in lower extremity function, mobility and decreased fall risk. Baseline: 17.62 Goal status: INITIAL  5.  Pt will be able to ambulate submerged ue support as needed up to 10 consecutive minutes to demonstrate improving toleration to amb. Baseline:  Goal status: INITIAL    LONG TERM GOALS: Target date: 08/11/23  Pt to meet stated Foto Goal of 50% Baseline: 38% Goal status: INITIAL  2.  Pt will be indep with final HEP's (land and aquatic as appropriate) for continued management of condition  Baseline: none Goal status: INITIAL  3.  Pt will improve strength in all areas listed by  up to 10lbs to demonstrate improved overall physical function Baseline: see chart Goal status: INITIAL  4.  Pt will report decrease in worst pain to </= 6/10 for improved toleration to activity/quality of life and to demonstrate improved management of pain.  Baseline: 10/10 Goal status: INITIAL  5.  Pt will be able to amb to and from setting (400 - 500 ft each way) using AD as needed to demonstrate improved gait and endurance/aerobic capacity. Baseline: 75 ft Goal status: INITIAL  6.  Pt to improve lumbar ROM by 25% for improved function Baseline: see chart Goal status: INITIAL  PLAN:  PT FREQUENCY: 2x/week  PT DURATION: 10 weeks  PLANNED INTERVENTIONS: Therapeutic exercises, Therapeutic activity, Neuromuscular re-education, Balance training, Gait training, Patient/Family education, Self Care, Joint mobilization, Joint manipulation, Stair  training, Orthotic/Fit training, DME instructions, Aquatic Therapy, Dry Needling, Electrical stimulation, Spinal mobilization, Cryotherapy, Moist heat, Taping, Traction, Ionotophoresis 4mg /ml Dexamethasone, Manual therapy, and Re-evaluation.  PLAN FOR NEXT SESSION: aquatic: gait, endurance, strength and balance training, pain control.  Mayer Camel, PTA 06/18/23 5:17 PM [facility] [address] [place], [place], [address] Phone: [phone]   Fax:  [phone]

## 2023-06-23 ENCOUNTER — Ambulatory Visit (HOSPITAL_BASED_OUTPATIENT_CLINIC_OR_DEPARTMENT_OTHER): Payer: BC Managed Care – PPO | Admitting: Physical Therapy

## 2023-06-23 ENCOUNTER — Encounter (HOSPITAL_BASED_OUTPATIENT_CLINIC_OR_DEPARTMENT_OTHER): Payer: Self-pay | Admitting: Physical Therapy

## 2023-06-23 DIAGNOSIS — M5459 Other low back pain: Secondary | ICD-10-CM

## 2023-06-23 DIAGNOSIS — M6281 Muscle weakness (generalized): Secondary | ICD-10-CM

## 2023-06-23 DIAGNOSIS — R2689 Other abnormalities of gait and mobility: Secondary | ICD-10-CM

## 2023-06-23 NOTE — Therapy (Signed)
OUTPATIENT PHYSICAL THERAPY THORACOLUMBAR TREATMENT   Patient Name: [name] MRN: [id number] DOB:04-28-1961, 62 y.o., female Today's Date: 06/23/2023  END OF SESSION:  PT End of Session - 06/23/23 0909     Visit Number 6    Number of Visits 20    Date for PT Re-Evaluation 08/11/23    Authorization Type BC[employer]    PT Start Time 0901    PT Stop Time 0940    PT Time Calculation (min) 39 min    Behavior During Therapy WFL for tasks assessed/performed             Past Medical History:  Diagnosis Date   Alcohol abuse    a. quit several years ago.   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   pacemaker insertion for tachy/brady syndrome   Spinal cord stimulator status    Spinal stenosis    Tobacco abuse    Past Surgical History:  Procedure Laterality Date   ANKLE SURGERY     ATRIAL FIBRILLATION ABLATION N/A 10/02/2020   Procedure: ATRIAL FIBRILLATION ABLATION;  Surgeon: Regan Lemming, MD;  Location: [facility];  Service: Cardiovascular;  Laterality: N/A;   CERVICAL CONE BIOPSY     CESAREAN SECTION     COLONOSCOPY     HERNIA REPAIR     KNEE SURGERY     x5   ORIF ANKLE FRACTURE Left 06/02/2018   Procedure: OPEN REDUCTION INTERNAL FIXATION (ORIF) ANKLE FRACTURE;  Surgeon: Toni Arthurs, MD;  Location: [facility];  Service: Orthopedics;  Laterality: Left;   ORIF ANKLE FRACTURE Left 08/26/2018   Procedure: Left ankle syndesmosis OPEN REDUCTION INTERNAL FIXATION (ORIF)/ reconstruction of  deltoid ligament;  Surgeon: Toni Arthurs, MD;  Location: [facility];  Service: Orthopedics;  Laterality: Left;   PACEMAKER IMPLANT N/A 08/05/2018   Procedure: PACEMAKER IMPLANT;  Surgeon: Regan Lemming, MD;  Location: [facility];  Service: Cardiovascular;  Laterality: N/A;   POLYPECTOMY     SPINAL CORD STIMULATOR INSERTION N/A 05/07/2016   Procedure: LUMBAR SPINAL CORD STIMULATOR INSERTION;  Surgeon: Venita Lick, MD;  Location: [facility];  Service: Orthopedics;  Laterality: N/A;   Patient Active Problem List   Diagnosis Date Noted   Tachy-brady syndrome (HCC) 08/05/2018   Closed fracture of left ankle 06/01/2018   Paroxysmal atrial fibrillation (HCC) 06/01/2018   Benign essential hypertension 06/01/2018   Atrial flutter (HCC) 06/01/2018   Near syncope 06/01/2018   Bradycardia    Posterior tibial tendon dysfunction (PTTD) of right lower extremity 03/15/2018   Primary localized osteoarthrosis of ankle and foot 01/21/2018   Long-term current use of opiate analgesic 11/10/2017   Therapeutic opioid induced constipation 10/27/2017   On long term drug therapy 10/14/2017   Chronic low back pain 10/14/2017   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   Chronic pain syndrome 10/14/2017  Pain in left foot 10/12/2017   Pain in right foot 10/12/2017   B12 deficiency 04/21/2016   Cobalamin deficiency 04/21/2016   Thrombocytopenia (HCC) 11/28/2015   Chronic pain 11/28/2015   Disease of jaw 11/28/2015   Hypokalemia 11/28/2015   Impaired fasting glucose 11/28/2015   Swelling of both lower extremities 11/28/2015   Tobacco abuse 11/28/2015   Vertigo 11/28/2015   Hereditary and idiopathic neuropathy 11/28/2015   Hereditary and idiopathic peripheral neuropathy 09/17/2009   Disorder of peripheral nervous system 09/17/2009   Alcoholic cirrhosis of liver (HCC) 10/02/2008   JAUNDICE 10/02/2008   Generalized abdominal pain 10/02/2008   Ascites  10/02/2008   Jaundice 10/02/2008   ESOPHAGEAL VARICES WITHOUT MENTION OF BLEEDING 04/04/2008   Esophageal varices (HCC) 04/04/2008   Alcohol abuse 03/16/2008   TRANSAMINASES, SERUM, ELEVATED 03/16/2008   Elevated levels of transaminase & lactic acid dehydrogenase 03/16/2008   SCARLET FEVER 03/14/2008   Hyperlipidemia 03/14/2008   External hemorrhoids 03/14/2008   Umbilical hernia 03/14/2008   Endometriosis 03/14/2008   DYSMENORRHEA 03/14/2008   INSOMNIA UNSPECIFIED 03/14/2008   Large liver 03/14/2008   Splenomegaly 03/14/2008   LIVER FUNCTION TESTS, ABNORMAL, HX OF 03/14/2008   ANAL FISSURE, HX OF 03/14/2008   Insomnia 03/14/2008   Allergic rhinitis 05/31/2007   Gastroesophageal reflux disease 05/31/2007   Osteoarthritis 05/31/2007    PCP: Richmond Campbell., PA-C   REFERRING PROVIDER: Pincus Badder, FNP   REFERRING DIAG: M54.50 (ICD-10-CM) - Low back pain, unspecified   Rationale for Evaluation and Treatment: Rehabilitation  THERAPY DIAG:  Other low back pain  Muscle weakness (generalized)  Other abnormalities of gait and mobility  ONSET DATE: 7 YRS  SUBJECTIVE:                                                                                                                                                                                           SUBJECTIVE STATEMENT: "My shoulders were sore after last session."  "My feet are numb today (varies)"  From evaluation:  8/15 steroid injection/ Lumbar facet arthropathy.  Will be having an ablation in Oct.  Left ankle replacement, bilat knee OA.  My feet and knees turn out.  Toes in left foot no sensation. Use cane to stop from falling. L4 ruptured disc 7 yrs ago wanted off of opioids so went with the stimulator.  Was unable to get off of opioids due to severity of pain. Back pain is high and constant.  Can't walk far. Being seen at [facility]   PERTINENT HISTORY:  Left ankle pain, S/P ankle replacement surgery [facility]  January 2023 Right wrist injury S/P pacemaker  implant 2019 ,atrial flutter/AFib? Chronic low back pain, spinal cord stimulator 2017 Tobacco dependence UDS 03/21/2019, 02/22/20, 07/22/21, 08/11/22 Eliquis SOAPP score 5 Oswestry score 20%, 42% (04/07/2022) Pain agreement 08/11/22;02/02/23 Narcan RX back and left buttock pain; CT myelogram L3-4 spinal stenosis loss of spouse-January 2024  PAIN:  Are you having pain? Yes: NPRS scale:  6/10 Pain location: LB across L>R into hips Pain description: tender Aggravating factors: Amb > 25 yds makes my eyes tear Relieving factors: opioids, lying down  PRECAUTIONS: Other: spinal cord stimulation  RED FLAGS: None   WEIGHT BEARING RESTRICTIONS: No  FALLS:  Has patient fallen in last 6 months? No  LIVING ENVIRONMENT: Lives with: lives with their daughter Lives in: House/apartment Stairs: Yes: Internal: 15 steps; can reach both Has following equipment at home: Single point cane, Wheelchair (manual), Shower bench, Grab bars, and lift chair  OCCUPATION: disabled  PLOF: Requires assistive device for independence  PATIENT GOALS: decrease muscle tightness in LB m;  NEXT MD VISIT: sept  OBJECTIVE:   DIAGNOSTIC FINDINGS:  01/02/23 CT Lumbar Spine IMPRESSION: 1. Progressive, advanced right-sided disc degeneration at L3-4 with a prominent right-sided disc osteophyte complex resulting in moderate spinal stenosis, moderate right lateral recess stenosis, and severe right neural foraminal stenosis. 2. Progressive, severe multilevel facet arthrosis. Grade 1 anterolisthesis develops with standing at L4-5. 3. New mild spinal stenosis and mild right neural foraminal stenosis at L4-5. 4. New mild left neural foraminal stenosis at L5-S1. 5.  Aortic Atherosclerosis (ICD10-I70.0)  Left ankle 06/01/23 Impression:  1. Stable appearing left total ankle arthroplasty. 2. Stable midfoot fusion. 3. Diffuse demineralization. 4. Hindfoot valgus.    PATIENT SURVEYS:  FOTO risk adjusted 38% with goal of 50%   COGNITION: Overall cognitive status: Within functional limits for tasks assessed     SENSATION: IMPAIRED LEFT FOREFOOT/TOES  MUSCLE LENGTH: Hamstrings: tight   POSTURE:  bilat knee valgus deformity; bilat ankle inversion and pronation , decreased lumbar lordosis  PALPATION: No TTP in LB; bilat knee crepitus LUMBAR ROM:   AROM eval  Flexion FT to mid shin  Extension 25%P!  Right lateral flexion 25%P!  Left lateral flexion 50%P!  Right rotation   Left rotation    (Blank rows = not tested)  LOWER EXTREMITY ROM:     Active  Right eval Left eval  Hip flexion    Hip extension    Hip abduction    Hip adduction    Hip internal rotation    Hip external rotation    Knee flexion 120 120  Knee extension 0 passive 0 passive  Ankle dorsiflexion  2d  Ankle plantarflexion  21d  Ankle inversion    Ankle eversion     (Blank rows = not tested)  LOWER EXTREMITY MMT:    MMT Right eval Left eval  Hip flexion 36.6 39.8  Hip extension    Hip abduction 29.2 37.1  Hip adduction    Hip internal rotation    Hip external rotation    Knee flexion    Knee extension 40.6 34.6  Ankle dorsiflexion    Ankle plantarflexion    Ankle inversion    Ankle eversion     (Blank rows = not tested)  LUMBAR SPECIAL TESTS:  Straight leg raise test: Positive and Thomas test: Negative  FUNCTIONAL TESTS:  Timed up and go (TUG): 17.62   GAIT: Distance walked: 400 ft Assistive device utilized: Single point cane Level of assistance: Modified independence Comments: External hip rotation, feet in pronation  TODAY'S TREATMENT:                                                                                                                              Pt seen for aquatic therapy today.  Treatment took place in water 3.5-4.75 ft in depth at the [facility] pool. Temp of water was 91.  Pt entered/exited the pool via stairs  independently with bilat rail.  * Unsupported walking forward/ backward  with cues, reciprocal arm swing and paddle hands next to body;  side stepping with arm addct/ abdct - 3 laps of each; back float for rest * wide stance and staggered stance with TrA set with long hollow noodle pulled down to thighs x 10;  * plank position/ superman position with rainbow hand floats in 60ft water x 5 * UE on rainbow hand floats: front to back pendulums x 6 - cues for chin tuck and buttocks down for forward roll * farmer carry with rainbow hand floats at side, walking forward/ backward in deepest water * SLS x 30s x 2 each LE (hands skulling to steady) * Straddling noodle:  cycling with UE on rainbow hand floats , cross country ski and hip abdct/ addct, cycling  * STS from bench in water with feet on ground x 4 ->  STS with feet on bench with forward arm reach x 5 with cues for controlled descent  Pt requires the buoyancy and hydrostatic pressure of water for support, and to offload joints by unweighting joint load by at least 50 % in navel deep water and by at least 75-80% in chest to neck deep water.  Viscosity of the water is needed for resistance of strengthening. Water current perturbations provides challenge to standing balance requiring increased core activation.     PATIENT EDUCATION:  Education details:aquatic therapy exercise rationale, modifications, progressions Person educated: Patient Education method: Explanation Education comprehension: verbalized understanding  HOME EXERCISE PROGRAM: TBA  ASSESSMENT:  CLINICAL IMPRESSION: Pt tolerated exercises during session well, with report of reduction of pressure in back. Decreased UE resistance to rainbow hand floats vs yellow with good tolerance.  Will plan to have pt walk continuously in water next visit - up to 10 min for STG 5.  Pt has met STG 1.    From evaluation:  Patient is a 62 y.o. f who was seen today for physical therapy evaluation  and treatment for LBP.  She has a complicated Pmhx with multiple orthopedic implications; bil hip, knee and feet oa; Left total ankle replacement with hindfoot valgus; Lumbar DDD, spinal stenosis, NFS, severe multilevel facet arthrosis. Grade 1 anterolisthesis L4-5.  She has been taking narcotics for years for pain control. Her main complaint today is back pain.  She is being seen at [facility] and is planning on a spinal nerve ablation in Oct. She presents with high pain sensitivity throughout using a cane for balance. She has significant postural dysfunction and muscle weakness, poor  balance and gait deviation.  Due to the complicated nature of pts situation she will be seen in aquatics only to benefit from the properties of water to address all function, balance, strength and pain then potentially transition to land based intervention. Plan to re-assess as needed to modify or adjust goals as pt progresses   OBJECTIVE IMPAIRMENTS: Abnormal gait, decreased activity tolerance, decreased balance, decreased coordination, decreased endurance, decreased mobility, difficulty walking, decreased ROM, decreased strength, impaired flexibility, impaired sensation, postural dysfunction, and pain.   ACTIVITY LIMITATIONS: carrying, lifting, bending, sitting, standing, squatting, stairs, transfers, locomotion level, and caring for others  PARTICIPATION LIMITATIONS: meal prep, cleaning, laundry, driving, shopping, community activity, occupation, and yard work  PERSONAL FACTORS: Fitness, Past/current experiences, Time since onset of injury/illness/exacerbation, and 3+ comorbidities: see impression statement  are also affecting patient's functional outcome.   REHAB POTENTIAL: Good  CLINICAL DECISION MAKING: Unstable/unpredictable  EVALUATION COMPLEXITY: High   GOALS: Goals reviewed with patient? Yes  SHORT TERM GOALS: Target date: 07/18/23  Pt will tolerate full aquatic sessions consistently without increase in pain  and with improving function to demonstrate good toleration and effectiveness of intervention.  Baseline: Goal status: MET -06/23/23  2.  Pt will report decrease in usual pain by up to 2 NPRS for improved toleration to activity/quality of life and to demonstrate improved management of pain. Baseline: 8/10 Goal status: INITIAL  3.  Pt will tolerate walking to or from setting along with tolerating full aquatic session without significant increase in pain or fatigue Baseline:  Goal status: INITIAL  4.  Pt will improve on Tug test to <or= 14s to demonstrate improvement in lower extremity function, mobility and decreased fall risk. Baseline: 17.62 Goal status: INITIAL  5.  Pt will be able to ambulate submerged ue support as needed up to 10 consecutive minutes to demonstrate improving toleration to amb. Baseline:  Goal status: INITIAL    LONG TERM GOALS: Target date: 08/11/23  Pt to meet stated Foto Goal of 50% Baseline: 38% Goal status: INITIAL  2.  Pt will be indep with final HEP's (land and aquatic as appropriate) for continued management of condition  Baseline: none Goal status: INITIAL  3.  Pt will improve strength in all areas listed by  up to 10lbs to demonstrate improved overall physical function Baseline: see chart Goal status: INITIAL  4.  Pt will report decrease in worst pain to </= 6/10 for improved toleration to activity/quality of life and to demonstrate improved management of pain.  Baseline: 10/10 Goal status: INITIAL  5.  Pt will be able to amb to and from setting (400 - 500 ft each way) using AD as needed to demonstrate improved gait and endurance/aerobic capacity. Baseline: 75 ft Goal status: INITIAL  6.  Pt to improve lumbar ROM by 25% for improved function Baseline: see chart Goal status: INITIAL  PLAN:  PT FREQUENCY: 2x/week  PT DURATION: 10 weeks  PLANNED INTERVENTIONS: Therapeutic exercises, Therapeutic activity, Neuromuscular re-education, Balance  training, Gait training, Patient/Family education, Self Care, Joint mobilization, Joint manipulation, Stair training, Orthotic/Fit training, DME instructions, Aquatic Therapy, Dry Needling, Electrical stimulation, Spinal mobilization, Cryotherapy, Moist heat, Taping, Traction, Ionotophoresis 4mg /ml Dexamethasone, Manual therapy, and Re-evaluation.  PLAN FOR NEXT SESSION: aquatic: gait, endurance, strength and balance training, pain control.   Mayer Camel, PTA 06/23/23 10:28 AM [facility] [address] [place], [place], [address] Phone: [phone]   Fax:  [phone]

## 2023-06-26 ENCOUNTER — Encounter (HOSPITAL_BASED_OUTPATIENT_CLINIC_OR_DEPARTMENT_OTHER): Payer: Self-pay | Admitting: Physical Therapy

## 2023-06-26 ENCOUNTER — Ambulatory Visit (HOSPITAL_BASED_OUTPATIENT_CLINIC_OR_DEPARTMENT_OTHER): Payer: BC Managed Care – PPO | Admitting: Physical Therapy

## 2023-06-26 DIAGNOSIS — R2681 Unsteadiness on feet: Secondary | ICD-10-CM

## 2023-06-26 DIAGNOSIS — M6281 Muscle weakness (generalized): Secondary | ICD-10-CM

## 2023-06-26 DIAGNOSIS — M5459 Other low back pain: Secondary | ICD-10-CM

## 2023-06-26 DIAGNOSIS — R2689 Other abnormalities of gait and mobility: Secondary | ICD-10-CM

## 2023-06-26 NOTE — Therapy (Signed)
OUTPATIENT PHYSICAL THERAPY THORACOLUMBAR TREATMENT   Patient Name: [name] MRN: [id number] DOB:[birth year], 62 y.o., female Today's Date: 06/26/2023  END OF SESSION:  PT End of Session - 06/26/23 0911     Visit Number 7    Number of Visits 20    Date for PT Re-Evaluation 08/11/23    Authorization Type BC[employer]    PT Start Time 0901    PT Stop Time 0940    PT Time Calculation (min) 39 min    Behavior During Therapy WFL for tasks assessed/performed             Past Medical History:  Diagnosis Date   Alcohol abuse    a. quit several years ago.   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   pacemaker insertion for tachy/brady syndrome   Spinal cord stimulator status    Spinal stenosis    Tobacco abuse    Past Surgical History:  Procedure Laterality Date   ANKLE SURGERY     ATRIAL FIBRILLATION ABLATION N/A 10/02/2020   Procedure: ATRIAL FIBRILLATION ABLATION;  Surgeon: Regan Lemming, MD;  Location: [facility];  Service: Cardiovascular;  Laterality: N/A;   CERVICAL CONE BIOPSY     CESAREAN SECTION     COLONOSCOPY     HERNIA REPAIR     KNEE SURGERY     x5   ORIF ANKLE FRACTURE Left 06/02/2018   Procedure: OPEN REDUCTION INTERNAL FIXATION (ORIF) ANKLE FRACTURE;  Surgeon: Toni Arthurs, MD;  Location: [facility];  Service: Orthopedics;  Laterality: Left;   ORIF ANKLE FRACTURE Left 08/26/2018   Procedure: Left ankle syndesmosis OPEN REDUCTION INTERNAL FIXATION (ORIF)/ reconstruction of  deltoid ligament;  Surgeon: Toni Arthurs, MD;  Location: [facility];  Service: Orthopedics;  Laterality: Left;   PACEMAKER IMPLANT N/A 08/05/2018   Procedure: PACEMAKER IMPLANT;  Surgeon: Regan Lemming, MD;  Location: [facility];  Service: Cardiovascular;  Laterality: N/A;   POLYPECTOMY     SPINAL CORD STIMULATOR INSERTION N/A 05/07/2016   Procedure: LUMBAR SPINAL CORD STIMULATOR INSERTION;  Surgeon: Venita Lick, MD;  Location: [facility];  Service: Orthopedics;  Laterality: N/A;   Patient Active Problem List   Diagnosis Date Noted   Tachy-brady syndrome (HCC) 08/05/2018   Closed fracture of left ankle 06/01/2018   Paroxysmal atrial fibrillation (HCC) 06/01/2018   Benign essential hypertension 06/01/2018   Atrial flutter (HCC) 06/01/2018   Near syncope 06/01/2018   Bradycardia    Posterior tibial tendon dysfunction (PTTD) of right lower extremity 03/15/2018   Primary localized osteoarthrosis of ankle and foot 01/21/2018   Long-term current use of opiate analgesic 11/10/2017   Therapeutic opioid induced constipation 10/27/2017   On long term drug therapy 10/14/2017   Chronic low back pain 10/14/2017   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   Chronic pain syndrome 10/14/2017  Pain in left foot 10/12/2017   Pain in right foot 10/12/2017   B12 deficiency 04/21/2016   Cobalamin deficiency 04/21/2016   Thrombocytopenia (HCC) 11/28/2015   Chronic pain 11/28/2015   Disease of jaw 11/28/2015   Hypokalemia 11/28/2015   Impaired fasting glucose 11/28/2015   Swelling of both lower extremities 11/28/2015   Tobacco abuse 11/28/2015   Vertigo 11/28/2015   Hereditary and idiopathic neuropathy 11/28/2015   Hereditary and idiopathic peripheral neuropathy 09/17/2009   Disorder of peripheral nervous system 09/17/2009   Alcoholic cirrhosis of liver (HCC) 10/02/2008   JAUNDICE 10/02/2008   Generalized abdominal pain 10/02/2008   Ascites  10/02/2008   Jaundice 10/02/2008   ESOPHAGEAL VARICES WITHOUT MENTION OF BLEEDING 04/04/2008   Esophageal varices (HCC) 04/04/2008   Alcohol abuse 03/16/2008   TRANSAMINASES, SERUM, ELEVATED 03/16/2008   Elevated levels of transaminase & lactic acid dehydrogenase 03/16/2008   SCARLET FEVER 03/14/2008   Hyperlipidemia 03/14/2008   External hemorrhoids 03/14/2008   Umbilical hernia 03/14/2008   Endometriosis 03/14/2008   DYSMENORRHEA 03/14/2008   INSOMNIA UNSPECIFIED 03/14/2008   Large liver 03/14/2008   Splenomegaly 03/14/2008   LIVER FUNCTION TESTS, ABNORMAL, HX OF 03/14/2008   ANAL FISSURE, HX OF 03/14/2008   Insomnia 03/14/2008   Allergic rhinitis 05/31/2007   Gastroesophageal reflux disease 05/31/2007   Osteoarthritis 05/31/2007    PCP: Richmond Campbell., PA-C   REFERRING PROVIDER: Pincus Badder, FNP   REFERRING DIAG: M54.50 (ICD-10-CM) - Low back pain, unspecified   Rationale for Evaluation and Treatment: Rehabilitation  THERAPY DIAG:  No diagnosis found.  ONSET DATE: 7 YRS  SUBJECTIVE:                                                                                                                                                                                           SUBJECTIVE STATEMENT: "My back was a little more sore after last session.  I think it was the pendulums".   From evaluation:  8/15 steroid injection/ Lumbar facet arthropathy.  Will be having an ablation in Oct.  Left ankle replacement, bilat knee OA.  My feet and knees turn out.  Toes in left foot no sensation. Use cane to stop from falling. L4 ruptured disc 7 yrs ago wanted off of opioids so went with the stimulator.  Was unable to get off of opioids due to severity of pain. Back pain is high and constant.  Can't walk far. Being seen at [facility]   PERTINENT HISTORY:  Left ankle pain, S/P ankle replacement surgery At [facility] January 2023 Right wrist injury S/P pacemaker implant 2019 ,atrial  flutter/AFib? Chronic low back pain,  spinal cord stimulator 2017 Tobacco dependence UDS 03/21/2019, 02/22/20, 07/22/21, 08/11/22 Eliquis SOAPP score 5 Oswestry score 20%, 42% (04/07/2022) Pain agreement 08/11/22;02/02/23 Narcan RX back and left buttock pain; CT myelogram L3-4 spinal stenosis loss of spouse-January 2024  PAIN:  Are you having pain? Yes: NPRS scale:  6/10 Pain location: LB across L>R into hips Pain description: tender Aggravating factors: Amb > 25 yds makes my eyes tear Relieving factors: opioids, lying down  PRECAUTIONS: Other: spinal cord stimulation  RED FLAGS: None   WEIGHT BEARING RESTRICTIONS: No  FALLS:  Has patient fallen in last 6 months? No  LIVING ENVIRONMENT: Lives with: lives with their daughter Lives in: House/apartment Stairs: Yes: Internal: 15 steps; can reach both Has following equipment at home: Single point cane, Wheelchair (manual), Shower bench, Grab bars, and lift chair  OCCUPATION: disabled  PLOF: Requires assistive device for independence  PATIENT GOALS: decrease muscle tightness in LB m;  NEXT MD VISIT: sept  OBJECTIVE:   DIAGNOSTIC FINDINGS:  01/02/23 CT Lumbar Spine IMPRESSION: 1. Progressive, advanced right-sided disc degeneration at L3-4 with a prominent right-sided disc osteophyte complex resulting in moderate spinal stenosis, moderate right lateral recess stenosis, and severe right neural foraminal stenosis. 2. Progressive, severe multilevel facet arthrosis. Grade 1 anterolisthesis develops with standing at L4-5. 3. New mild spinal stenosis and mild right neural foraminal stenosis at L4-5. 4. New mild left neural foraminal stenosis at L5-S1. 5.  Aortic Atherosclerosis (ICD10-I70.0)  Left ankle 06/01/23 Impression:  1. Stable appearing left total ankle arthroplasty. 2. Stable midfoot fusion. 3. Diffuse demineralization. 4. Hindfoot valgus.   PATIENT SURVEYS:  FOTO  40% with goal of 50% 06/26/23:  FOTO  38%  COGNITION: Overall cognitive status: Within functional limits for tasks assessed     SENSATION: IMPAIRED LEFT FOREFOOT/TOES  MUSCLE LENGTH: Hamstrings: tight   POSTURE:  bilat knee valgus deformity; bilat ankle inversion and pronation , decreased lumbar lordosis  PALPATION: No TTP in LB; bilat knee crepitus LUMBAR ROM:   AROM eval  Flexion FT to mid shin  Extension 25%P!  Right lateral flexion 25%P!  Left lateral flexion 50%P!  Right rotation   Left rotation    (Blank rows = not tested)  LOWER EXTREMITY ROM:     Active  Right eval Left eval  Hip flexion    Hip extension    Hip abduction    Hip adduction    Hip internal rotation    Hip external rotation    Knee flexion 120 120  Knee extension 0 passive 0 passive  Ankle dorsiflexion  2d  Ankle plantarflexion  21d  Ankle inversion    Ankle eversion     (Blank rows = not tested)  LOWER EXTREMITY MMT:    MMT Right eval Left eval  Hip flexion 36.6 39.8  Hip extension    Hip abduction 29.2 37.1  Hip adduction    Hip internal rotation    Hip external rotation    Knee flexion    Knee extension 40.6 34.6  Ankle dorsiflexion    Ankle plantarflexion    Ankle inversion    Ankle eversion     (Blank rows = not tested)  LUMBAR SPECIAL TESTS:  Straight leg raise test: Positive and Thomas test: Negative  FUNCTIONAL TESTS:  Timed up and go (TUG): 17.62   GAIT: Distance walked: 400 ft Assistive device utilized: Single point cane Level of assistance: Modified independence Comments: External hip rotation, feet in pronation  TODAY'S TREATMENT:  Pt seen for aquatic therapy today.  Treatment took place in water 3.5-4.75 ft in depth at the [facility] pool. Temp of water was 91.  Pt entered/exited the pool via stairs independently with bilat rail.  * Unsupported walking  forward/ backward  with cues, reciprocal arm swing;  side stepping with arm addct/ abdct with yellow hand floats - 3 laps of each; back float for rest * supported back float with blue solid noodle under arms, ankle floats donned - alternating straight LE hip ext x 10, hip abdct/ addct at surface x 10 * seated on solid noodle (yellow hollow too much buoyancy) - like a swing with single arm raises (good challenge)  * Straddling solid noodle:  cycling with breast stroke arms,  cross country ski and reverse jumping jacks, return to cycling  * wide stance and staggered stance with TrA set with long hollow noodle pulled down to thighs x 10;  * SLS x 30s x 2 each LE (hands skulling to steady, toes pointing towards outside window) * STS with feet on bench with forward arm reach x 5 with cues for controlled descent  Pt requires the buoyancy and hydrostatic pressure of water for support, and to offload joints by unweighting joint load by at least 50 % in navel deep water and by at least 75-80% in chest to neck deep water.  Viscosity of the water is needed for resistance of strengthening. Water current perturbations provides challenge to standing balance requiring increased core activation.     PATIENT EDUCATION:  Education details:aquatic therapy exercise rationale, modifications, progressions Person educated: Patient Education method: Explanation Education comprehension: verbalized understanding  HOME EXERCISE PROGRAM: TBA  ASSESSMENT:  CLINICAL IMPRESSION: Pt tolerated exercises during session well, with report of reduction of pressure in back.   Standing and seated balance exercises added today.   Will plan to have pt walk continuously in water next visit - up to 10 min for STG 5.  She continues to require transport to/from pool area to parking lot.    From evaluation:  Patient is a 62 y.o. f who was seen today for physical therapy evaluation and treatment for LBP.  She has a complicated Pmhx  with multiple orthopedic implications; bil hip, knee and feet oa; Left total ankle replacement with hindfoot valgus; Lumbar DDD, spinal stenosis, NFS, severe multilevel facet arthrosis. Grade 1 anterolisthesis L4-5.  She has been taking narcotics for years for pain control. Her main complaint today is back pain.  She is being seen at [facility] and is planning on a spinal nerve ablation in Oct. She presents with high pain sensitivity throughout using a cane for balance. She has significant postural dysfunction and muscle weakness, poor balance and gait deviation.  Due to the complicated nature of pts situation she will be seen in aquatics only to benefit from the properties of water to address all function, balance, strength and pain then potentially transition to land based intervention. Plan to re-assess as needed to modify or adjust goals as pt progresses   OBJECTIVE IMPAIRMENTS: Abnormal gait, decreased activity tolerance, decreased balance, decreased coordination, decreased endurance, decreased mobility, difficulty walking, decreased ROM, decreased strength, impaired flexibility, impaired sensation, postural dysfunction, and pain.   ACTIVITY LIMITATIONS: carrying, lifting, bending, sitting, standing, squatting, stairs, transfers, locomotion level, and caring for others  PARTICIPATION LIMITATIONS: meal prep, cleaning, laundry, driving, shopping, community activity, occupation, and yard work  PERSONAL FACTORS: Fitness, Past/current experiences, Time since onset of injury/illness/exacerbation, and 3+ comorbidities: see impression  statement  are also affecting patient's functional outcome.   REHAB POTENTIAL: Good  CLINICAL DECISION MAKING: Unstable/unpredictable  EVALUATION COMPLEXITY: High   GOALS: Goals reviewed with patient? Yes  SHORT TERM GOALS: Target date: 07/18/23  Pt will tolerate full aquatic sessions consistently without increase in pain and with improving function to demonstrate good  toleration and effectiveness of intervention.  Baseline: Goal status: MET -06/23/23  2.  Pt will report decrease in usual pain by up to 2 NPRS for improved toleration to activity/quality of life and to demonstrate improved management of pain. Baseline: 8/10 Goal status: INITIAL  3.  Pt will tolerate walking to or from setting along with tolerating full aquatic session without significant increase in pain or fatigue Baseline:  Goal status: INITIAL  4.  Pt will improve on Tug test to <or= 14s to demonstrate improvement in lower extremity function, mobility and decreased fall risk. Baseline: 17.62 Goal status: INITIAL  5.  Pt will be able to ambulate submerged ue support as needed up to 10 consecutive minutes to demonstrate improving toleration to amb. Baseline:  Goal status: INITIAL    LONG TERM GOALS: Target date: 08/11/23  Pt to meet stated Foto Goal of 50% Baseline: 38% Goal status: INITIAL  2.  Pt will be indep with final HEP's (land and aquatic as appropriate) for continued management of condition  Baseline: none Goal status: INITIAL  3.  Pt will improve strength in all areas listed by  up to 10lbs to demonstrate improved overall physical function Baseline: see chart Goal status: INITIAL  4.  Pt will report decrease in worst pain to </= 6/10 for improved toleration to activity/quality of life and to demonstrate improved management of pain.  Baseline: 10/10 Goal status: INITIAL  5.  Pt will be able to amb to and from setting (400 - 500 ft each way) using AD as needed to demonstrate improved gait and endurance/aerobic capacity. Baseline: 75 ft Goal status: INITIAL  6.  Pt to improve lumbar ROM by 25% for improved function Baseline: see chart Goal status: INITIAL  PLAN:  PT FREQUENCY: 2x/week  PT DURATION: 10 weeks  PLANNED INTERVENTIONS: Therapeutic exercises, Therapeutic activity, Neuromuscular re-education, Balance training, Gait training, Patient/Family  education, Self Care, Joint mobilization, Joint manipulation, Stair training, Orthotic/Fit training, DME instructions, Aquatic Therapy, Dry Needling, Electrical stimulation, Spinal mobilization, Cryotherapy, Moist heat, Taping, Traction, Ionotophoresis 4mg /ml Dexamethasone, Manual therapy, and Re-evaluation.  PLAN FOR NEXT SESSION: aquatic: gait, endurance, strength and balance training, pain control.  Mayer Camel, PTA 06/26/23 4:39 PM [facility] [address] [place], [place], [address] Phone: [phone]   Fax:  [phone]

## 2023-07-01 ENCOUNTER — Ambulatory Visit (HOSPITAL_BASED_OUTPATIENT_CLINIC_OR_DEPARTMENT_OTHER): Payer: BC Managed Care – PPO | Admitting: Physical Therapy

## 2023-07-01 ENCOUNTER — Encounter (HOSPITAL_BASED_OUTPATIENT_CLINIC_OR_DEPARTMENT_OTHER): Payer: Self-pay | Admitting: Physical Therapy

## 2023-07-01 ENCOUNTER — Telehealth: Payer: Self-pay | Admitting: Cardiovascular Disease

## 2023-07-01 DIAGNOSIS — M6281 Muscle weakness (generalized): Secondary | ICD-10-CM

## 2023-07-01 DIAGNOSIS — I5022 Chronic systolic (congestive) heart failure: Secondary | ICD-10-CM

## 2023-07-01 DIAGNOSIS — R2681 Unsteadiness on feet: Secondary | ICD-10-CM

## 2023-07-01 DIAGNOSIS — R2689 Other abnormalities of gait and mobility: Secondary | ICD-10-CM

## 2023-07-01 DIAGNOSIS — M5459 Other low back pain: Secondary | ICD-10-CM | POA: Diagnosis not present

## 2023-07-01 DIAGNOSIS — I420 Dilated cardiomyopathy: Secondary | ICD-10-CM

## 2023-07-01 MED ORDER — LOSARTAN POTASSIUM 25 MG PO TABS: 25.0000 mg | ORAL_TABLET | Freq: Every day | ORAL | 3 refills | Status: DC

## 2023-07-01 NOTE — Telephone Encounter (Signed)
Tracy Stade, MD 05/03/2023  2:40 PM EDT     I haven't seen her in 4 years seen by [facility] EF stable 45-50% since 2019 should start cozaar 25 mg daily check BMET/BNP in 3 weeks after starting and f/u with PA    Called patient back with results. Patient verbalized understanding. Will send Losartan to patient's pharmacy of choice and have patient's lab work done on 10/16. Patient is scheduled to see Dr. Eden Emms in November.

## 2023-07-01 NOTE — Telephone Encounter (Signed)
Patient states she is returning a call from [facility] regarding her echo.

## 2023-07-01 NOTE — Therapy (Signed)
OUTPATIENT PHYSICAL THERAPY THORACOLUMBAR TREATMENT   Patient Name: [name] MRN: [id number] DOB:1961-05-22, 62 y.o., female Today's Date: 07/01/2023  END OF SESSION:  PT End of Session - 07/01/23 1554     Visit Number 8    Number of Visits 20    Date for PT Re-Evaluation 08/11/23    Authorization Type BCBS    PT Start Time 1546    PT Stop Time 1630    PT Time Calculation (min) 44 min    Activity Tolerance Patient tolerated treatment well    Behavior During Therapy WFL for tasks assessed/performed             Past Medical History:  Diagnosis Date   Alcohol abuse    a. quit several years ago.   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   pacemaker insertion for tachy/brady syndrome   Spinal cord stimulator status    Spinal stenosis    Tobacco abuse    Past Surgical History:  Procedure Laterality Date   ANKLE SURGERY     ATRIAL FIBRILLATION ABLATION N/A 10/02/2020   Procedure: ATRIAL FIBRILLATION ABLATION;  Surgeon: Regan Lemming, MD;  Location: [facility];  Service: Cardiovascular;  Laterality: N/A;   CERVICAL CONE BIOPSY     CESAREAN SECTION     COLONOSCOPY     HERNIA REPAIR     KNEE SURGERY     x5   ORIF ANKLE FRACTURE Left 06/02/2018   Procedure: OPEN REDUCTION INTERNAL FIXATION (ORIF) ANKLE FRACTURE;  Surgeon: Toni Arthurs, MD;  Location: [facility];  Service: Orthopedics;  Laterality: Left;   ORIF ANKLE FRACTURE Left 08/26/2018   Procedure: Left ankle syndesmosis OPEN  REDUCTION INTERNAL FIXATION (ORIF)/ reconstruction of deltoid ligament;  Surgeon: Toni Arthurs, MD;  Location: [facility];  Service: Orthopedics;  Laterality: Left;   PACEMAKER IMPLANT N/A 08/05/2018   Procedure: PACEMAKER IMPLANT;  Surgeon: Regan Lemming, MD;  Location: [facility];  Service: Cardiovascular;  Laterality: N/A;   POLYPECTOMY     SPINAL CORD STIMULATOR INSERTION N/A 05/07/2016   Procedure: LUMBAR SPINAL CORD STIMULATOR INSERTION;  Surgeon: Venita Lick, MD;  Location: [facility];  Service: Orthopedics;  Laterality: N/A;   Patient Active Problem List   Diagnosis Date Noted   Tachy-brady syndrome (HCC) 08/05/2018   Closed fracture of left ankle 06/01/2018   Paroxysmal atrial fibrillation (HCC) 06/01/2018   Benign essential hypertension 06/01/2018   Atrial flutter (HCC) 06/01/2018   Near syncope 06/01/2018   Bradycardia    Posterior tibial tendon dysfunction (PTTD) of right lower extremity 03/15/2018   Primary localized osteoarthrosis of ankle and foot 01/21/2018   Long-term current use of opiate analgesic 11/10/2017   Therapeutic opioid induced constipation 10/27/2017   On long term drug therapy 10/14/2017   Chronic low back pain 10/14/2017   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) 10/14/2017   Chronic pain syndrome 10/14/2017   Pain in left foot 10/12/2017   Pain in right foot 10/12/2017   B12 deficiency 04/21/2016   Cobalamin deficiency 04/21/2016   Thrombocytopenia (HCC) 11/28/2015   Chronic pain 11/28/2015   Disease of jaw 11/28/2015   Hypokalemia 11/28/2015   Impaired fasting glucose 11/28/2015   Swelling of both lower extremities 11/28/2015   Tobacco abuse 11/28/2015   Vertigo 11/28/2015   Hereditary and idiopathic neuropathy 11/28/2015   Hereditary and idiopathic peripheral neuropathy 09/17/2009   Disorder of peripheral nervous system 09/17/2009   Alcoholic cirrhosis of liver (HCC) 10/02/2008   JAUNDICE 10/02/2008    Generalized abdominal pain 10/02/2008   Ascites 10/02/2008   Jaundice 10/02/2008   ESOPHAGEAL VARICES WITHOUT MENTION OF BLEEDING 04/04/2008   Esophageal varices (HCC) 04/04/2008   Alcohol abuse 03/16/2008   TRANSAMINASES, SERUM, ELEVATED 03/16/2008   Elevated levels of transaminase & lactic acid dehydrogenase 03/16/2008   SCARLET FEVER 03/14/2008   Hyperlipidemia 03/14/2008   External hemorrhoids 03/14/2008   Umbilical hernia 03/14/2008   Endometriosis 03/14/2008   DYSMENORRHEA 03/14/2008   INSOMNIA UNSPECIFIED 03/14/2008   Large liver 03/14/2008   Splenomegaly 03/14/2008   LIVER FUNCTION TESTS, ABNORMAL, HX OF 03/14/2008   ANAL FISSURE, HX OF 03/14/2008   Insomnia 03/14/2008   Allergic rhinitis 05/31/2007   Gastroesophageal reflux disease 05/31/2007   Osteoarthritis 05/31/2007    PCP: Richmond Campbell., PA-C   REFERRING PROVIDER: Pincus Badder, FNP   REFERRING DIAG: M54.50 (ICD-10-CM) - Low back pain, unspecified   Rationale for Evaluation and Treatment: Rehabilitation  THERAPY DIAG:  Other low back pain  Muscle weakness (generalized)  Other abnormalities of gait and mobility  Unsteadiness on feet  ONSET DATE: 7 YRS  SUBJECTIVE:                                                                                                                                                                                           SUBJECTIVE STATEMENT: "My back was a little more sore after last session.  I think it was the pendulums".   From evaluation:  8/15 steroid injection/ Lumbar facet arthropathy.  Will be having an ablation in Oct.  Left ankle replacement, bilat knee OA.  My feet and knees turn out.  Toes in left foot no sensation. Use cane to stop from falling. L4 ruptured disc 7 yrs ago wanted off of opioids so went with the stimulator.  Was unable to get off of opioids due to severity of pain. Back pain is high and constant.  Can't walk far. Being seen at  [facility]  HISTORY:  Left ankle pain, S/P ankle replacement surgery At [facility] January 2023 Right wrist injury S/P pacemaker implant 2019 ,atrial flutter/AFib? Chronic low back pain, spinal cord stimulator 2017 Tobacco dependence UDS 03/21/2019, 02/22/20, 07/22/21, 08/11/22 Eliquis SOAPP score 5 Oswestry score 20%, 42% (04/07/2022) Pain agreement 08/11/22;02/02/23 Narcan RX back and left buttock pain; CT myelogram L3-4 spinal stenosis loss of spouse-January 2024  PAIN:  Are you having pain? Yes: NPRS scale:  7/10 Pain location: LB across L>R into hips Pain description: tender Aggravating factors: Amb > 25 yds makes my eyes tear Relieving factors: opioids, lying down  PRECAUTIONS: Other: spinal cord stimulation  RED FLAGS: None   WEIGHT BEARING RESTRICTIONS: No  FALLS:  Has patient fallen in last 6 months? No  LIVING ENVIRONMENT: Lives with: lives with their daughter Lives in: House/apartment Stairs: Yes: Internal: 15 steps; can reach both Has following equipment at home: Single point cane, Wheelchair (manual), Shower bench, Grab bars, and lift chair  OCCUPATION: disabled  PLOF: Requires assistive device for independence  PATIENT GOALS: decrease muscle tightness in LB m;  NEXT MD VISIT: sept  OBJECTIVE:   DIAGNOSTIC FINDINGS:  01/02/23 CT Lumbar Spine IMPRESSION: 1. Progressive, advanced right-sided disc degeneration at L3-4 with a prominent right-sided disc osteophyte complex resulting in moderate spinal stenosis, moderate right lateral recess stenosis, and severe right neural foraminal stenosis. 2. Progressive, severe multilevel facet arthrosis. Grade 1 anterolisthesis develops with standing at L4-5. 3. New mild spinal stenosis and mild right neural foraminal stenosis at L4-5. 4. New mild left neural foraminal stenosis at L5-S1. 5.  Aortic Atherosclerosis (ICD10-I70.0)  Left ankle 06/01/23 Impression:  1. Stable appearing left total ankle  arthroplasty. 2. Stable midfoot fusion. 3. Diffuse demineralization. 4. Hindfoot valgus.   PATIENT SURVEYS:  FOTO  40% with goal of 50% 06/26/23:  FOTO 38%  COGNITION: Overall cognitive status: Within functional limits for tasks assessed     SENSATION: IMPAIRED LEFT FOREFOOT/TOES  MUSCLE LENGTH: Hamstrings: tight   POSTURE:  bilat knee valgus deformity; bilat ankle inversion and pronation , decreased lumbar lordosis  PALPATION: No TTP in LB; bilat knee crepitus LUMBAR ROM:   AROM eval  Flexion FT to mid shin  Extension 25%P!  Right lateral flexion 25%P!  Left lateral flexion 50%P!  Right rotation   Left rotation    (Blank rows = not tested)  LOWER EXTREMITY ROM:     Active  Right eval Left eval  Hip flexion    Hip extension    Hip abduction    Hip adduction    Hip internal rotation    Hip external rotation    Knee flexion 120 120  Knee extension 0 passive 0 passive  Ankle dorsiflexion  2d  Ankle plantarflexion  21d  Ankle inversion    Ankle eversion     (Blank rows = not tested)  LOWER EXTREMITY MMT:    MMT Right eval Left eval  Hip flexion 36.6 39.8  Hip extension    Hip abduction 29.2 37.1  Hip adduction    Hip internal rotation    Hip external rotation    Knee flexion    Knee extension 40.6 34.6  Ankle dorsiflexion    Ankle plantarflexion    Ankle inversion    Ankle eversion     (Blank rows = not tested)  LUMBAR SPECIAL TESTS:  Straight leg raise test: Positive and Thomas test: Negative  FUNCTIONAL TESTS:  Timed up and go (TUG): 17.62   GAIT: Distance walked: 400  ft Assistive device utilized: Single point cane Level of assistance: Modified independence Comments: External hip rotation, feet in pronation  TODAY'S TREATMENT:                                                                                                                              Pt seen for aquatic therapy today.  Treatment took place in water 3.5-4.75 ft in depth  at the [facility] pool. Temp of water was 91.  Pt entered/exited the pool via stairs independently with bilat rail.  * Unsupported walking forward/ backward  with cues, reciprocal arm swing x 11 continuous mins * seated on solid noodle - like a swing with single arm raises (good challenge)  * Straddling solid noodle:  cycling with breast stroke arms,  cross country ski and reverse jumping jacks, return to cycling  * wide stance and staggered stance with TrA set with long hollow noodle ->solid noodle pulled down to thighs x 8 *Oblique set pushing solid noodle R/L x3 10s hold. *Supine suspension: hip extension x 10 with 3 sec hold *decompression using noodles and nekdoodle   Pt requires the buoyancy and hydrostatic pressure of water for support, and to offload joints by unweighting joint load by at least 50 % in navel deep water and by at least 75-80% in chest to neck deep water.  Viscosity of the water is needed for resistance of strengthening. Water current perturbations provides challenge to standing balance requiring increased core activation.     PATIENT EDUCATION:  Education details:aquatic therapy exercise rationale, modifications, progressions Person educated: Patient Education method: Explanation Education comprehension: verbalized understanding  HOME EXERCISE PROGRAM: TBA  ASSESSMENT:  CLINICAL IMPRESSION: Pt met STG# 5 walking continuously x 10 mins.  She did complain of increased right sided LBP with amb below 4 ft so task completed in 4.0 and > depth. Progressed posterior core strengthening in sup suspension with hip extension which she reports feels good . Oblique exercises met with some pulling in LB.  She reports painfree with suspended supine floating. Goals ongoing     From evaluation:  Patient is a 62 y.o. f who was seen today for physical therapy evaluation and treatment for LBP.  She has a complicated Pmhx with multiple orthopedic implications; bil hip,  knee and feet oa; Left total ankle replacement with hindfoot valgus; Lumbar DDD, spinal stenosis, NFS, severe multilevel facet arthrosis. Grade 1 anterolisthesis L4-5.  She has been taking narcotics for years for pain control. Her main complaint today is back pain.  She is being seen at [facility] and is planning on a spinal nerve ablation in Oct. She presents with high pain sensitivity throughout using a cane for balance. She has significant postural dysfunction and muscle weakness, poor balance and gait deviation.  Due to the complicated nature of pts situation she will be seen in aquatics only to benefit from the properties of water to address all function, balance, strength and pain  then potentially transition to land based intervention. Plan to re-assess as needed to modify or adjust goals as pt progresses   OBJECTIVE IMPAIRMENTS: Abnormal gait, decreased activity tolerance, decreased balance, decreased coordination, decreased endurance, decreased mobility, difficulty walking, decreased ROM, decreased strength, impaired flexibility, impaired sensation, postural dysfunction, and pain.   ACTIVITY LIMITATIONS: carrying, lifting, bending, sitting, standing, squatting, stairs, transfers, locomotion level, and caring for others  PARTICIPATION LIMITATIONS: meal prep, cleaning, laundry, driving, shopping, community activity, occupation, and yard work  PERSONAL FACTORS: Fitness, Past/current experiences, Time since onset of injury/illness/exacerbation, and 3+ comorbidities: see impression statement  are also affecting patient's functional outcome.   REHAB POTENTIAL: Good  CLINICAL DECISION MAKING: Unstable/unpredictable  EVALUATION COMPLEXITY: High   GOALS: Goals reviewed with patient? Yes  SHORT TERM GOALS: Target date: 07/18/23  Pt will tolerate full aquatic sessions consistently without increase in pain and with improving function to demonstrate good toleration and effectiveness of intervention.   Baseline: Goal status: MET -06/23/23  2.  Pt will report decrease in usual pain by up to 2 NPRS for improved toleration to activity/quality of life and to demonstrate improved management of pain. Baseline: 8/10 Goal status: INITIAL  3.  Pt will tolerate walking to or from setting along with tolerating full aquatic session without significant increase in pain or fatigue Baseline:  Goal status: INITIAL  4.  Pt will improve on Tug test to <or= 14s to demonstrate improvement in lower extremity function, mobility and decreased fall risk. Baseline: 17.62 Goal status: INITIAL  5.  Pt will be able to ambulate submerged ue support as needed up to 10 consecutive minutes to demonstrate improving toleration to amb. Baseline:  Goal status: Met 07/01/23    LONG TERM GOALS: Target date: 08/11/23  Pt to meet stated Foto Goal of 50% Baseline: 38% Goal status: INITIAL  2.  Pt will be indep with final HEP's (land and aquatic as appropriate) for continued management of condition  Baseline: none Goal status: INITIAL  3.  Pt will improve strength in all areas listed by  up to 10lbs to demonstrate improved overall physical function Baseline: see chart Goal status: INITIAL  4.  Pt will report decrease in worst pain to </= 6/10 for improved toleration to activity/quality of life and to demonstrate improved management of pain.  Baseline: 10/10 Goal status: INITIAL  5.  Pt will be able to amb to and from setting (400 - 500 ft each way) using AD as needed to demonstrate improved gait and endurance/aerobic capacity. Baseline: 75 ft Goal status: INITIAL  6.  Pt to improve lumbar ROM by 25% for improved function Baseline: see chart Goal status: INITIAL  PLAN:  PT FREQUENCY: 2x/week  PT DURATION: 10 weeks  PLANNED INTERVENTIONS: Therapeutic exercises, Therapeutic activity, Neuromuscular re-education, Balance training, Gait training, Patient/Family education, Self Care, Joint mobilization, Joint  manipulation, Stair training, Orthotic/Fit training, DME instructions, Aquatic Therapy, Dry Needling, Electrical stimulation, Spinal mobilization, Cryotherapy, Moist heat, Taping, Traction, Ionotophoresis 4mg /ml Dexamethasone, Manual therapy, and Re-evaluation.  PLAN FOR NEXT SESSION: aquatic: gait, endurance, strength and balance training, pain control. Corrie Dandy Bicknell) Rhia Blatchford MPT 07/01/23 3:58 PM [facility] [address] [place], [place], [address] Phone: [phone]   Fax:  [phone]

## 2023-07-06 ENCOUNTER — Ambulatory Visit (HOSPITAL_BASED_OUTPATIENT_CLINIC_OR_DEPARTMENT_OTHER): Payer: BC Managed Care – PPO | Admitting: Physical Therapy

## 2023-07-08 ENCOUNTER — Encounter (HOSPITAL_BASED_OUTPATIENT_CLINIC_OR_DEPARTMENT_OTHER): Payer: Self-pay | Admitting: Physical Therapy

## 2023-07-08 ENCOUNTER — Ambulatory Visit (HOSPITAL_BASED_OUTPATIENT_CLINIC_OR_DEPARTMENT_OTHER): Payer: BC Managed Care – PPO | Attending: Family Medicine | Admitting: Physical Therapy

## 2023-07-08 DIAGNOSIS — R2681 Unsteadiness on feet: Secondary | ICD-10-CM

## 2023-07-08 DIAGNOSIS — M5459 Other low back pain: Secondary | ICD-10-CM

## 2023-07-08 DIAGNOSIS — M6281 Muscle weakness (generalized): Secondary | ICD-10-CM

## 2023-07-08 DIAGNOSIS — R2689 Other abnormalities of gait and mobility: Secondary | ICD-10-CM

## 2023-07-08 NOTE — Therapy (Signed)
OUTPATIENT PHYSICAL THERAPY THORACOLUMBAR TREATMENT   Patient Name: [name] MRN: [id number] DOB:[birth year], 62 y.o., female Today's Date: 07/08/2023  END OF SESSION:  PT End of Session - 07/08/23 1120     Visit Number 9    Number of Visits 20    Date for PT Re-Evaluation 08/11/23    Authorization Type BCBS    PT Start Time 1117    PT Stop Time 1200    PT Time Calculation (min) 43 min    Activity Tolerance Patient tolerated treatment well    Behavior During Therapy WFL for tasks assessed/performed             Past Medical History:  Diagnosis Date   Alcohol abuse    a. quit several years ago.   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   pacemaker insertion for tachy/brady syndrome   Spinal cord stimulator status    Spinal stenosis    Tobacco abuse    Past Surgical History:  Procedure Laterality Date   ANKLE SURGERY     ATRIAL FIBRILLATION ABLATION N/A 10/02/2020   Procedure: ATRIAL FIBRILLATION ABLATION;  Surgeon: Regan Lemming, MD;  Location: [facility];  Service: Cardiovascular;  Laterality: N/A;   CERVICAL CONE BIOPSY     CESAREAN SECTION     COLONOSCOPY     HERNIA REPAIR     KNEE SURGERY     x5   ORIF ANKLE FRACTURE Left 06/02/2018   Procedure: OPEN REDUCTION INTERNAL FIXATION (ORIF) ANKLE FRACTURE;  Surgeon: Toni Arthurs, MD;  Location: [facility];  Service: Orthopedics;  Laterality: Left;   ORIF ANKLE FRACTURE Left 08/26/2018   Procedure: Left ankle syndesmosis OPEN  REDUCTION INTERNAL FIXATION (ORIF)/ reconstruction of deltoid ligament;  Surgeon: Toni Arthurs, MD;  Location: [facility];  Service: Orthopedics;  Laterality: Left;   PACEMAKER IMPLANT N/A 08/05/2018   Procedure: PACEMAKER IMPLANT;  Surgeon: Regan Lemming, MD;  Location: [facility];  Service: Cardiovascular;  Laterality: N/A;   POLYPECTOMY     SPINAL CORD STIMULATOR INSERTION N/A 05/07/2016   Procedure: LUMBAR SPINAL CORD STIMULATOR INSERTION;  Surgeon: Venita Lick, MD;  Location: [facility];  Service: Orthopedics;  Laterality: N/A;   Patient Active Problem List   Diagnosis Date Noted   Tachy-brady syndrome (HCC) 08/05/2018   Closed fracture of left ankle 06/01/2018   Paroxysmal atrial fibrillation (HCC) 06/01/2018   Benign essential hypertension 06/01/2018   Atrial flutter (HCC) 06/01/2018   Near syncope 06/01/2018   Bradycardia    Posterior tibial tendon dysfunction (PTTD) of right lower extremity 03/15/2018   Primary localized osteoarthrosis of ankle and foot 01/21/2018   Long-term current use of opiate analgesic 11/10/2017   Therapeutic opioid induced constipation 10/27/2017   On long term drug therapy 10/14/2017   Chronic low back pain 10/14/2017   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) 10/14/2017   Chronic pain syndrome 10/14/2017   Pain in left foot 10/12/2017   Pain in right foot 10/12/2017   B12 deficiency 04/21/2016   Cobalamin deficiency 04/21/2016   Thrombocytopenia (HCC) 11/28/2015   Chronic pain 11/28/2015   Disease of jaw 11/28/2015   Hypokalemia 11/28/2015   Impaired fasting glucose 11/28/2015   Swelling of both lower extremities 11/28/2015   Tobacco abuse 11/28/2015   Vertigo 11/28/2015   Hereditary and idiopathic neuropathy 11/28/2015   Hereditary and idiopathic peripheral neuropathy 09/17/2009   Disorder of peripheral nervous system 09/17/2009   Alcoholic cirrhosis of liver (HCC) 10/02/2008   Jaundice, non-neonatal  10/02/2008   Generalized abdominal pain 10/02/2008   Ascites 10/02/2008   Jaundice 10/02/2008   Esophageal varices (HCC) 04/04/2008   Esophageal varices (HCC) 04/04/2008   Alcohol abuse 03/16/2008   TRANSAMINASES, SERUM, ELEVATED 03/16/2008   Elevated levels of transaminase & lactic acid dehydrogenase 03/16/2008   SCARLET FEVER 03/14/2008   Hyperlipidemia 03/14/2008   External hemorrhoids 03/14/2008   Umbilical hernia 03/14/2008   Endometriosis 03/14/2008   DYSMENORRHEA 03/14/2008   INSOMNIA UNSPECIFIED 03/14/2008   Large liver 03/14/2008   Splenomegaly 03/14/2008   LIVER FUNCTION TESTS, ABNORMAL, HX OF 03/14/2008   History of disease of skin and subcutaneous tissue 03/14/2008   Insomnia 03/14/2008   Allergic rhinitis 05/31/2007   Gastroesophageal reflux disease 05/31/2007   Osteoarthritis 05/31/2007    PCP: Richmond Campbell., PA-C   REFERRING PROVIDER: Pincus Badder, FNP   REFERRING DIAG: M54.50 (ICD-10-CM) - Low back pain, unspecified   Rationale for Evaluation and Treatment: Rehabilitation  THERAPY DIAG:  Other low back pain  Muscle weakness (generalized)  Other abnormalities of gait and mobility  Unsteadiness on feet  ONSET DATE: 7 YRS  SUBJECTIVE:                                                                                                                                                                                           SUBJECTIVE STATEMENT: "My back is bad today but I have been approved for the ablation on Oct 24   From evaluation:  8/15 steroid injection/ Lumbar facet arthropathy.  Will be having an ablation in Oct.  Left ankle replacement, bilat knee OA.  My feet and knees turn out.  Toes in left foot no sensation. Use cane to stop from falling. L4 ruptured disc 7 yrs ago wanted off of opioids so went with the stimulator.  Was unable to get off of opioids due to severity of pain. Back pain is high and constant.  Can't walk far. Being seen  at [facility]  PERTINENT HISTORY:  Left ankle pain, S/P ankle replacement surgery At [facility] January 2023 Right wrist injury S/P pacemaker implant 2019 ,atrial flutter/AFib? Chronic low back pain, spinal cord stimulator 2017 Tobacco dependence UDS 03/21/2019, 02/22/20, 07/22/21, 08/11/22 Eliquis SOAPP score 5 Oswestry score 20%, 42% (04/07/2022) Pain agreement 08/11/22;02/02/23 Narcan RX back and left buttock pain; CT myelogram L3-4 spinal stenosis loss of spouse-January 2024  PAIN:  Are you having pain? Yes: NPRS scale:  7/10 Pain location: LB across L>R into hips Pain description: tender Aggravating factors: Amb > 25 yds makes my eyes tear Relieving factors: opioids, lying down  PRECAUTIONS: Other: spinal cord stimulation  RED FLAGS: None   WEIGHT BEARING RESTRICTIONS: No  FALLS:  Has patient fallen in last 6 months? No  LIVING ENVIRONMENT: Lives with: lives with their daughter Lives in: House/apartment Stairs: Yes: Internal: 15 steps; can reach both Has following equipment at home: Single point cane, Wheelchair (manual), Shower bench, Grab bars, and lift chair  OCCUPATION: disabled  PLOF: Requires assistive device for independence  PATIENT GOALS: decrease muscle tightness in LB m;  NEXT MD VISIT: sept  OBJECTIVE:   DIAGNOSTIC FINDINGS:  01/02/23 CT Lumbar Spine IMPRESSION: 1. Progressive, advanced right-sided disc degeneration at L3-4 with a prominent right-sided disc osteophyte complex resulting in moderate spinal stenosis, moderate right lateral recess stenosis, and severe right neural foraminal stenosis. 2. Progressive, severe multilevel facet arthrosis. Grade 1 anterolisthesis develops with standing at L4-5. 3. New mild spinal stenosis and mild right neural foraminal stenosis at L4-5. 4. New mild left neural foraminal stenosis at L5-S1. 5.  Aortic Atherosclerosis (ICD10-I70.0)  Left ankle 06/01/23 Impression:  1. Stable appearing left total ankle  arthroplasty. 2. Stable midfoot fusion. 3. Diffuse demineralization. 4. Hindfoot valgus.   PATIENT SURVEYS:  FOTO  40% with goal of 50% 06/26/23:  FOTO 38%  COGNITION: Overall cognitive status: Within functional limits for tasks assessed     SENSATION: IMPAIRED LEFT FOREFOOT/TOES  MUSCLE LENGTH: Hamstrings: tight   POSTURE:  bilat knee valgus deformity; bilat ankle inversion and pronation , decreased lumbar lordosis  PALPATION: No TTP in LB; bilat knee crepitus LUMBAR ROM:   AROM eval  Flexion FT to mid shin  Extension 25%P!  Right lateral flexion 25%P!  Left lateral flexion 50%P!  Right rotation   Left rotation    (Blank rows = not tested)  LOWER EXTREMITY ROM:     Active  Right eval Left eval  Hip flexion    Hip extension    Hip abduction    Hip adduction    Hip internal rotation    Hip external rotation    Knee flexion 120 120  Knee extension 0 passive 0 passive  Ankle dorsiflexion  2d  Ankle plantarflexion  21d  Ankle inversion    Ankle eversion     (Blank rows = not tested)  LOWER EXTREMITY MMT:    MMT Right eval Left eval  Hip flexion 36.6 39.8  Hip extension    Hip abduction 29.2 37.1  Hip adduction    Hip internal rotation    Hip external rotation    Knee flexion    Knee extension 40.6 34.6  Ankle dorsiflexion    Ankle plantarflexion    Ankle inversion    Ankle eversion     (Blank rows = not tested)  LUMBAR SPECIAL TESTS:  Straight leg raise test: Positive and Thomas test: Negative  FUNCTIONAL TESTS:  Timed up and go (TUG): 17.62   GAIT: Distance walked:  400 ft Assistive device utilized: Single point cane Level of assistance: Modified independence Comments: External hip rotation, feet in pronation  TODAY'S TREATMENT:                                                                                                                              Pt seen for aquatic therapy today.  Treatment took place in water 3.5-4.75 ft in depth  at the [facility] pool. Temp of water was 91.  Pt entered/exited the pool via stairs independently with bilat rail.  * Unsupported walking forward/ backward  with cues, reciprocal arm swing  * wide stance and staggered stance with TrA set with long hollow  noodle ->solid noodle pulled down to thighs x 8-10 *Oblique set pushing solid noodle R/L x5 10s hold. *KB row wide stance then staggered x 15 rep 2 sets.  Vcs for increased speed *Bow& arrow x 10 *decompression position for recovery * Straddling solid noodle:  cycling with breast stroke arms,  cross country ski and reverse jumping jacks  Pt requires the buoyancy and hydrostatic pressure of water for support, and to offload joints by unweighting joint load by at least 50 % in navel deep water and by at least 75-80% in chest to neck deep water.  Viscosity of the water is needed for resistance of strengthening. Water current perturbations provides challenge to standing balance requiring increased core activation.     PATIENT EDUCATION:  Education details:aquatic therapy exercise rationale, modifications, progressions Person educated: Patient Education method: Explanation Education comprehension: verbalized understanding  HOME EXERCISE PROGRAM: TBA  ASSESSMENT:  CLINICAL IMPRESSION: Pt reports her usual pain seems to be about 6/10 meeting STG# 2.  Hasn't varied much, does decrease some on days with therapy. Ablation date scheduled. Focus today on core strength. Added increased foam resistance with Tra engagement. Continued cues for abd bracing throughout session.  No pain with exercises today. Instructed pt on pool acces going forward. Goals ongoing      From evaluation:  Patient is a 62 y.o. f who was seen today for physical therapy evaluation and treatment for LBP.  She has a complicated Pmhx with multiple orthopedic implications; bil hip, knee and feet oa; Left total ankle replacement with hindfoot valgus; Lumbar DDD,  spinal stenosis, NFS, severe multilevel facet arthrosis. Grade 1 anterolisthesis L4-5.  She has been taking narcotics for years for pain control. Her main complaint today is back pain.  She is [facility] at  [facility] and is planning on a spinal nerve ablation in Oct. She presents with high pain sensitivity throughout using a cane for balance. She has significant postural dysfunction and muscle weakness, poor balance and gait deviation.  Due to the complicated nature of pts situation she will be seen in aquatics only to benefit from the properties of water to address all function, balance, strength and pain then potentially transition to land based intervention. Plan to re-assess as needed to modify or adjust  goals as pt progresses   OBJECTIVE IMPAIRMENTS: Abnormal gait, decreased activity tolerance, decreased balance, decreased coordination, decreased endurance, decreased mobility, difficulty walking, decreased ROM, decreased strength, impaired flexibility, impaired sensation, postural dysfunction, and pain.   ACTIVITY LIMITATIONS: carrying, lifting, bending, sitting, standing, squatting, stairs, transfers, locomotion level, and caring for others  PARTICIPATION LIMITATIONS: meal prep, cleaning, laundry, driving, shopping, community activity, occupation, and yard work  PERSONAL FACTORS: Fitness, Past/current experiences, Time since onset of injury/illness/exacerbation, and 3+ comorbidities: see impression statement  are also affecting patient's functional outcome.   REHAB POTENTIAL: Good  CLINICAL DECISION MAKING: Unstable/unpredictable  EVALUATION COMPLEXITY: High   GOALS: Goals reviewed with patient? Yes  SHORT TERM GOALS: Target date: 07/18/23  Pt will tolerate full aquatic sessions consistently without increase in pain and with improving function to demonstrate good toleration and effectiveness of intervention.  Baseline: Goal status: MET -06/23/23  2.  Pt will report decrease in usual pain  by up to 2 NPRS for improved toleration to activity/quality of life and to demonstrate improved management of pain. Baseline: 8/10 Goal status:Met 07/08/23  3.  Pt will tolerate walking to or from setting along with tolerating full aquatic session without significant increase in pain or fatigue Baseline:  Goal status: INITIAL  4.  Pt will improve on Tug test to <or= 14s to demonstrate improvement in lower extremity function, mobility and decreased fall risk. Baseline: 17.62 Goal status: INITIAL  5.  Pt will be able to ambulate submerged ue support as needed up to 10 consecutive minutes to demonstrate improving toleration to amb. Baseline:  Goal status: Met 07/01/23    LONG TERM GOALS: Target date: 08/11/23  Pt to meet stated Foto Goal of 50% Baseline: 38% Goal status: INITIAL  2.  Pt will be indep with final HEP's (land and aquatic as appropriate) for continued management of condition  Baseline: none Goal status: INITIAL  3.  Pt will improve strength in all areas listed by  up to 10lbs to demonstrate improved overall physical function Baseline: see chart Goal status: INITIAL  4.  Pt will report decrease in worst pain to </= 6/10 for improved toleration to activity/quality of life and to demonstrate improved management of pain.  Baseline: 10/10 Goal status: INITIAL  5.  Pt will be able to amb to and from setting (400 - 500 ft each way) using AD as needed to demonstrate improved gait and endurance/aerobic capacity. Baseline: 75 ft Goal status: INITIAL  6.  Pt to improve lumbar ROM by 25% for improved function Baseline: see chart Goal status: INITIAL  PLAN:  PT FREQUENCY: 2x/week  PT DURATION: 10 weeks  PLANNED INTERVENTIONS: Therapeutic exercises, Therapeutic activity, Neuromuscular re-education, Balance training, Gait training, Patient/Family education, Self Care, Joint mobilization, Joint manipulation, Stair training, Orthotic/Fit training, DME instructions, Aquatic  Therapy, Dry Needling, Electrical stimulation, Spinal mobilization, Cryotherapy, Moist heat, Taping, Traction, Ionotophoresis 4mg /ml Dexamethasone, Manual therapy, and Re-evaluation.  PLAN FOR NEXT SESSION: aquatic: gait, endurance, strength and balance training, pain control. Corrie Dandy [place]) Haitham Dolinsky MPT 07/08/23 11:21 AM [facility] [address] [place], [place], [address] Phone: [phone]   Fax:  [phone]

## 2023-07-10 ENCOUNTER — Encounter (HOSPITAL_BASED_OUTPATIENT_CLINIC_OR_DEPARTMENT_OTHER): Payer: Self-pay | Admitting: Physical Therapy

## 2023-07-10 ENCOUNTER — Ambulatory Visit (HOSPITAL_BASED_OUTPATIENT_CLINIC_OR_DEPARTMENT_OTHER): Payer: BC Managed Care – PPO | Admitting: Physical Therapy

## 2023-07-10 DIAGNOSIS — R2689 Other abnormalities of gait and mobility: Secondary | ICD-10-CM

## 2023-07-10 DIAGNOSIS — M5459 Other low back pain: Secondary | ICD-10-CM

## 2023-07-10 DIAGNOSIS — M6281 Muscle weakness (generalized): Secondary | ICD-10-CM

## 2023-07-10 DIAGNOSIS — R2681 Unsteadiness on feet: Secondary | ICD-10-CM

## 2023-07-10 NOTE — Therapy (Signed)
OUTPATIENT PHYSICAL THERAPY THORACOLUMBAR TREATMENT   Patient Name: [name] MRN: [id number] DOB:1961-03-28, 62 y.o., female Today's Date: 07/10/2023  END OF SESSION:  PT End of Session - 07/10/23 1521     Visit Number 10    Number of Visits 20    Date for PT Re-Evaluation 08/11/23    Authorization Type BCBS    PT Start Time 1426    PT Stop Time 1512    PT Time Calculation (min) 46 min    Activity Tolerance Patient tolerated treatment well    Behavior During Therapy WFL for tasks assessed/performed              Past Medical History:  Diagnosis Date   Alcohol abuse    a. quit several years ago.   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   pacemaker insertion for tachy/brady syndrome   Spinal cord stimulator status    Spinal stenosis    Tobacco abuse    Past Surgical History:  Procedure Laterality Date   ANKLE SURGERY     ATRIAL FIBRILLATION ABLATION N/A 10/02/2020   Procedure: ATRIAL FIBRILLATION ABLATION;  Surgeon: Regan Lemming, MD;  Location: [facility];  Service: Cardiovascular;  Laterality: N/A;   CERVICAL CONE BIOPSY     CESAREAN SECTION     COLONOSCOPY     HERNIA REPAIR     KNEE SURGERY     x5   ORIF ANKLE FRACTURE Left 06/02/2018   Procedure: OPEN REDUCTION INTERNAL FIXATION (ORIF) ANKLE FRACTURE;  Surgeon: Toni Arthurs, MD;  Location: [facility];  Service: Orthopedics;  Laterality: Left;   ORIF ANKLE FRACTURE Left 08/26/2018   Procedure: Left ankle syndesmosis  OPEN REDUCTION INTERNAL FIXATION (ORIF)/ reconstruction of deltoid ligament;  Surgeon: Toni Arthurs, MD;  Location: [facility];  Service: Orthopedics;  Laterality: Left;   PACEMAKER IMPLANT N/A 08/05/2018   Procedure: PACEMAKER IMPLANT;  Surgeon: Regan Lemming, MD;  Location: [facility];  Service: Cardiovascular;  Laterality: N/A;   POLYPECTOMY     SPINAL CORD STIMULATOR INSERTION N/A 05/07/2016   Procedure: LUMBAR SPINAL CORD STIMULATOR INSERTION;  Surgeon: Venita Lick, MD;  Location: [facility];  Service: Orthopedics;  Laterality: N/A;   Patient Active Problem List   Diagnosis Date Noted   Tachy-brady syndrome (HCC) 08/05/2018   Closed fracture of left ankle 06/01/2018   Paroxysmal atrial fibrillation (HCC) 06/01/2018   Benign essential hypertension 06/01/2018   Atrial flutter (HCC) 06/01/2018   Near syncope 06/01/2018   Bradycardia    Posterior tibial tendon dysfunction (PTTD) of right lower extremity 03/15/2018   Primary localized osteoarthrosis of ankle and foot 01/21/2018   Long-term current use of opiate analgesic 11/10/2017   Therapeutic opioid induced constipation 10/27/2017   On long term drug therapy 10/14/2017   Chronic low back pain 10/14/2017   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) 10/14/2017   Chronic pain syndrome 10/14/2017   Pain in left foot 10/12/2017   Pain in right foot 10/12/2017   B12 deficiency 04/21/2016   Cobalamin deficiency 04/21/2016   Thrombocytopenia (HCC) 11/28/2015   Chronic pain 11/28/2015   Disease of jaw 11/28/2015   Hypokalemia 11/28/2015   Impaired fasting glucose 11/28/2015   Swelling of both lower extremities 11/28/2015   Tobacco abuse 11/28/2015   Vertigo 11/28/2015   Hereditary and idiopathic neuropathy 11/28/2015   Hereditary and idiopathic peripheral neuropathy 09/17/2009   Disorder of peripheral nervous system 09/17/2009   Alcoholic cirrhosis of liver (HCC) 10/02/2008   Jaundice, non-neonatal  10/02/2008   Generalized abdominal pain 10/02/2008   Ascites 10/02/2008   Jaundice 10/02/2008   Esophageal varices (HCC) 04/04/2008   Esophageal varices (HCC) 04/04/2008   Alcohol abuse 03/16/2008   TRANSAMINASES, SERUM, ELEVATED 03/16/2008   Elevated levels of transaminase & lactic acid dehydrogenase 03/16/2008   SCARLET FEVER 03/14/2008   Hyperlipidemia 03/14/2008   External hemorrhoids 03/14/2008   Umbilical hernia 03/14/2008   Endometriosis 03/14/2008   DYSMENORRHEA 03/14/2008   INSOMNIA UNSPECIFIED 03/14/2008   Large liver 03/14/2008   Splenomegaly 03/14/2008   LIVER FUNCTION TESTS, ABNORMAL, HX OF 03/14/2008   History of disease of skin and subcutaneous tissue 03/14/2008   Insomnia 03/14/2008   Allergic rhinitis 05/31/2007   Gastroesophageal reflux disease 05/31/2007   Osteoarthritis 05/31/2007    PCP: Richmond Campbell., PA-C   REFERRING PROVIDER: Pincus Badder, FNP   REFERRING DIAG: M54.50 (ICD-10-CM) - Low back pain, unspecified   Rationale for Evaluation and Treatment: Rehabilitation  THERAPY DIAG:  Other low back pain  Muscle weakness (generalized)  Other abnormalities of gait and mobility  Unsteadiness on feet  ONSET DATE: 7 YRS  SUBJECTIVE:                                                                                                                                                                                           SUBJECTIVE STATEMENT: "My arms were really sore after the last session".  She reports she is having less abdominal pain after sessions than she did initially.   From evaluation:  8/15 steroid injection/ Lumbar facet arthropathy.  Will be having an ablation in Oct.  Left ankle replacement, bilat knee OA.  My feet and knees turn out.  Toes in left foot no sensation. Use cane to stop from falling. L4 ruptured disc 7 yrs ago wanted off of opioids so went with the stimulator.  Was unable to get off of opioids due to severity of  pain. Back pain is high and constant.  Can't walk far. Being seen at [facility]   PERTINENT HISTORY:  Left ankle pain, S/P ankle replacement surgery At [facility] January 2023 Right wrist injury S/P pacemaker implant 2019 ,atrial flutter/AFib? Chronic low back pain, spinal cord stimulator 2017 Tobacco dependence UDS 03/21/2019, 02/22/20, 07/22/21, 08/11/22 Eliquis SOAPP score 5 Oswestry score 20%, 42% (04/07/2022) Pain agreement 08/11/22;02/02/23 Narcan RX back and left buttock pain; CT myelogram L3-4 spinal stenosis loss of spouse-January 2024  PAIN:  Are you having pain? Yes: NPRS scale:  7/10 Pain location: LB across L>R into hips Pain description: tender Aggravating factors: Amb > 25 yds makes my eyes tear Relieving factors: opioids, lying down  PRECAUTIONS: Other: spinal cord stimulation  RED FLAGS: None   WEIGHT BEARING RESTRICTIONS: No  FALLS:  Has patient fallen in last 6 months? No  LIVING ENVIRONMENT: Lives with: lives with their daughter Lives in: House/apartment Stairs: Yes: Internal: 15 steps; can reach both Has following equipment at home: Single point cane, Wheelchair (manual), Shower bench, Grab bars, and lift chair  OCCUPATION: disabled  PLOF: Requires assistive device for independence  PATIENT GOALS: decrease muscle tightness in LB m;  NEXT MD VISIT: sept  OBJECTIVE:   DIAGNOSTIC FINDINGS:  01/02/23 CT Lumbar Spine IMPRESSION: 1. Progressive, advanced right-sided disc degeneration at L3-4 with a prominent right-sided disc osteophyte complex resulting in moderate spinal stenosis, moderate right lateral recess stenosis, and severe right neural foraminal stenosis. 2. Progressive, severe multilevel facet arthrosis. Grade 1 anterolisthesis develops with standing at L4-5. 3. New mild spinal stenosis and mild right neural foraminal stenosis at L4-5. 4. New mild left neural foraminal stenosis at L5-S1. 5.  Aortic Atherosclerosis (ICD10-I70.0)  Left ankle  06/01/23 Impression:  1. Stable appearing left total ankle arthroplasty. 2. Stable midfoot fusion. 3. Diffuse demineralization. 4. Hindfoot valgus.   PATIENT SURVEYS:  FOTO  40% with goal of 50% 06/26/23:  FOTO 38%  COGNITION: Overall cognitive status: Within functional limits for tasks assessed     SENSATION: IMPAIRED LEFT FOREFOOT/TOES  MUSCLE LENGTH: Hamstrings: tight   POSTURE:  bilat knee valgus deformity; bilat ankle inversion and pronation , decreased lumbar lordosis  PALPATION: No TTP in LB; bilat knee crepitus LUMBAR ROM:   AROM eval  Flexion FT to mid shin  Extension 25%P!  Right lateral flexion 25%P!  Left lateral flexion 50%P!  Right rotation   Left rotation    (Blank rows = not tested)  LOWER EXTREMITY ROM:     Active  Right eval Left eval  Hip flexion    Hip extension    Hip abduction    Hip adduction    Hip internal rotation    Hip external rotation    Knee flexion 120 120  Knee extension 0 passive 0 passive  Ankle dorsiflexion  2d  Ankle plantarflexion  21d  Ankle inversion    Ankle eversion     (Blank rows = not tested)  LOWER EXTREMITY MMT:    MMT Right eval Left eval  Hip flexion 36.6 39.8  Hip extension    Hip abduction 29.2 37.1  Hip adduction    Hip internal rotation    Hip external rotation    Knee flexion    Knee extension 40.6 34.6  Ankle dorsiflexion    Ankle plantarflexion    Ankle inversion    Ankle eversion     (Blank rows = not tested)  LUMBAR SPECIAL TESTS:  Straight leg raise test: Positive and Thomas test: Negative  FUNCTIONAL TESTS:  Timed up  and go (TUG): 17.62   GAIT: Distance walked: 400 ft Assistive device utilized: Single point cane Level of assistance: Modified independence Comments: External hip rotation, feet in pronation  TODAY'S TREATMENT:                                                                                                                              Pt seen for aquatic  therapy today.  Treatment took place in water 3.5-4.75 ft in depth at the [facility] pool. Temp of water was 91.  Pt entered/exited the pool via stairs independently with bilat rail.  * Unsupported walking forward/ backward  - 10 min for warm up / endurance * wide stance with TrA set with long hollow  noodle x 10 * staggered stance with kick board row x 15-20 each LE forward  * UE on yellow hand floats: single leg supermans 2 x 5  * Straddling solid noodle:  cycling with breast stroke arms,  cross country ski * SLS with yellow hand floats and noodle stomp with hollow blue noodle x 12 each LE  * 3 way LE stretch with blue hollow noodle x 15s each position  Pt requires the buoyancy and hydrostatic pressure of water for support, and to offload joints by unweighting joint load by at least 50 % in navel deep water and by at least 75-80% in chest to neck deep water.  Viscosity of the water is needed for resistance of strengthening. Water current perturbations provides challenge to standing balance requiring increased core activation.     PATIENT EDUCATION:  Education details:aquatic therapy exercise rationale, modifications, progressions Person educated: Patient Education method: Explanation Education comprehension: verbalized understanding  HOME EXERCISE PROGRAM: TBA  ASSESSMENT:  CLINICAL IMPRESSION: Pt reports pain remained unchanged (6/10)during session,  except when in back float and when cycling on noodle. She was challenged with single leg supermans (decreased balance, and back fatigued).  She demonstrated difficulty with upright posture with single leg stance with noodle stomp; improved with single hand on deck. Pt is progressing gradually towards established goals. Therapist to check STG 3and 4 next visit if time allows.       From evaluation:  Patient is a [name] who was seen today for physical therapy evaluation and treatment for LBP.  She has a complicated Pmhx  with multiple orthopedic implications; bil hip, knee and feet oa; Left total ankle replacement with hindfoot valgus; Lumbar DDD, spinal stenosis, NFS, severe multilevel facet arthrosis. Grade 1 anterolisthesis L4-5.  She has been taking narcotics for years for pain control. Her main complaint today is back pain.  She is being seen at [facility] and is planning on a spinal nerve ablation in Oct. She presents with high pain sensitivity throughout using a cane for balance. She has significant postural dysfunction and muscle weakness, poor balance and gait deviation.  Due to the complicated nature of pts situation she will be seen in aquatics only to  benefit from the properties of water to address all function, balance, strength and pain then potentially transition to land based intervention. Plan to re-assess as needed to modify or adjust goals as pt progresses   OBJECTIVE IMPAIRMENTS: Abnormal gait, decreased activity tolerance, decreased balance, decreased coordination, decreased endurance, decreased mobility, difficulty walking, decreased ROM, decreased strength, impaired flexibility, impaired sensation, postural dysfunction, and pain.   ACTIVITY LIMITATIONS: carrying, lifting, bending, sitting, standing, squatting, stairs, transfers, locomotion level, and caring for others  PARTICIPATION LIMITATIONS: meal prep, cleaning, laundry, driving, shopping, community activity, occupation, and yard work  PERSONAL FACTORS: Fitness, Past/current experiences, Time since onset of injury/illness/exacerbation, and 3+ comorbidities: see impression statement  are also affecting patient's functional outcome.   REHAB POTENTIAL: Good  CLINICAL DECISION MAKING: Unstable/unpredictable  EVALUATION COMPLEXITY: High   GOALS: Goals reviewed with patient? Yes  SHORT TERM GOALS: Target date: 07/18/23  Pt will tolerate full aquatic sessions consistently without increase in pain and with improving function to demonstrate good  toleration and effectiveness of intervention.  Baseline: Goal status: MET -06/23/23  2.  Pt will report decrease in usual pain by up to 2 NPRS for improved toleration to activity/quality of life and to demonstrate improved management of pain. Baseline: 8/10 Goal status:Met 07/08/23  3.  Pt will tolerate walking to or from setting along with tolerating full aquatic session without significant increase in pain or fatigue Baseline:  Goal status: INITIAL  4.  Pt will improve on Tug test to <or= 14s to demonstrate improvement in lower extremity function, mobility and decreased fall risk. Baseline: 17.62 Goal status: INITIAL  5.  Pt will be able to ambulate submerged ue support as needed up to 10 consecutive minutes to demonstrate improving toleration to amb. Baseline:  Goal status: Met 07/01/23    LONG TERM GOALS: Target date: 08/11/23  Pt to meet stated Foto Goal of 50% Baseline: 38% Goal status: INITIAL  2.  Pt will be indep with final HEP's (land and aquatic as appropriate) for continued management of condition  Baseline: none Goal status: INITIAL  3.  Pt will improve strength in all areas listed by  up to 10lbs to demonstrate improved overall physical function Baseline: see chart Goal status: INITIAL  4.  Pt will report decrease in worst pain to </= 6/10 for improved toleration to activity/quality of life and to demonstrate improved management of pain.  Baseline: 10/10 Goal status: INITIAL  5.  Pt will be able to amb to and from setting (400 - 500 ft each way) using AD as needed to demonstrate improved gait and endurance/aerobic capacity. Baseline: 75 ft Goal status: INITIAL  6.  Pt to improve lumbar ROM by 25% for improved function Baseline: see chart Goal status: INITIAL  PLAN:  PT FREQUENCY: 2x/week  PT DURATION: 10 weeks  PLANNED INTERVENTIONS: Therapeutic exercises, Therapeutic activity, Neuromuscular re-education, Balance training, Gait training, Patient/Family  education, Self Care, Joint mobilization, Joint manipulation, Stair training, Orthotic/Fit training, DME instructions, Aquatic Therapy, Dry Needling, Electrical stimulation, Spinal mobilization, Cryotherapy, Moist heat, Taping, Traction, Ionotophoresis 4mg /ml Dexamethasone, Manual therapy, and Re-evaluation.  PLAN FOR NEXT SESSION: aquatic: gait, endurance, strength and balance training, pain control.   Mayer Camel, PTA 07/10/23 3:21 PM [facility] [address] [place], [place], [address] Phone: [phone]   Fax:  [phone]

## 2023-07-13 ENCOUNTER — Ambulatory Visit (HOSPITAL_BASED_OUTPATIENT_CLINIC_OR_DEPARTMENT_OTHER): Payer: BC Managed Care – PPO | Admitting: Physical Therapy

## 2023-07-13 ENCOUNTER — Encounter (HOSPITAL_BASED_OUTPATIENT_CLINIC_OR_DEPARTMENT_OTHER): Payer: Self-pay | Admitting: Physical Therapy

## 2023-07-13 DIAGNOSIS — M5459 Other low back pain: Secondary | ICD-10-CM | POA: Diagnosis not present

## 2023-07-13 DIAGNOSIS — M6281 Muscle weakness (generalized): Secondary | ICD-10-CM

## 2023-07-13 DIAGNOSIS — R2681 Unsteadiness on feet: Secondary | ICD-10-CM

## 2023-07-13 DIAGNOSIS — R2689 Other abnormalities of gait and mobility: Secondary | ICD-10-CM

## 2023-07-13 NOTE — Therapy (Signed)
OUTPATIENT PHYSICAL THERAPY THORACOLUMBAR TREATMENT   Patient Name: [name] MRN: [id number] DOB:[birth year], 62 y.o., female Today's Date: 07/13/2023  END OF SESSION:  PT End of Session - 07/13/23 1125     Visit Number 11    Number of Visits 20    Date for PT Re-Evaluation 08/11/23    Authorization Type BCBS    PT Start Time 1117    PT Stop Time 1200    PT Time Calculation (min) 43 min    Activity Tolerance Patient tolerated treatment well    Behavior During Therapy WFL for tasks assessed/performed              Past Medical History:  Diagnosis Date   Alcohol abuse    a. quit several years ago.   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   pacemaker insertion for tachy/brady syndrome   Spinal cord stimulator status    Spinal stenosis    Tobacco abuse    Past Surgical History:  Procedure Laterality Date   ANKLE SURGERY     ATRIAL FIBRILLATION ABLATION N/A 10/02/2020   Procedure: ATRIAL FIBRILLATION ABLATION;  Surgeon: Regan Lemming, MD;  Location: [facility];  Service: Cardiovascular;  Laterality: N/A;   CERVICAL CONE BIOPSY     CESAREAN SECTION     COLONOSCOPY     HERNIA REPAIR     KNEE SURGERY     x5   ORIF ANKLE FRACTURE Left 06/02/2018   Procedure: OPEN REDUCTION INTERNAL FIXATION (ORIF) ANKLE FRACTURE;  Surgeon: Toni Arthurs, MD;  Location: [facility];  Service: Orthopedics;  Laterality: Left;   ORIF ANKLE FRACTURE Left 08/26/2018   Procedure: Left ankle syndesmosis  OPEN REDUCTION INTERNAL FIXATION (ORIF)/ reconstruction of deltoid ligament;  Surgeon: Toni Arthurs, MD;  Location: [facility];  Service: Orthopedics;  Laterality: Left;   PACEMAKER IMPLANT N/A 08/05/2018   Procedure: PACEMAKER IMPLANT;  Surgeon: Regan Lemming, MD;  Location: [facility];  Service: Cardiovascular;  Laterality: N/A;   POLYPECTOMY     SPINAL CORD STIMULATOR INSERTION N/A 05/07/2016   Procedure: LUMBAR SPINAL CORD STIMULATOR INSERTION;  Surgeon: Venita Lick, MD;  Location: [facility];  Service: Orthopedics;  Laterality: N/A;   Patient Active Problem List   Diagnosis Date Noted   Tachy-brady syndrome (HCC) 08/05/2018   Closed fracture of left ankle 06/01/2018   Paroxysmal atrial fibrillation (HCC) 06/01/2018   Benign essential hypertension 06/01/2018   Atrial flutter (HCC) 06/01/2018   Near syncope 06/01/2018   Bradycardia    Posterior tibial tendon dysfunction (PTTD) of right lower extremity 03/15/2018   Primary localized osteoarthrosis of ankle and foot 01/21/2018   Long-term current use of opiate analgesic 11/10/2017   Therapeutic opioid induced constipation 10/27/2017   On long term drug therapy 10/14/2017   Chronic low back pain 10/14/2017   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) 10/14/2017   Chronic pain syndrome 10/14/2017   Pain in left foot 10/12/2017   Pain in right foot 10/12/2017   B12 deficiency 04/21/2016   Cobalamin deficiency 04/21/2016   Thrombocytopenia (HCC) 11/28/2015   Chronic pain 11/28/2015   Disease of jaw 11/28/2015   Hypokalemia 11/28/2015   Impaired fasting glucose 11/28/2015   Swelling of both lower extremities 11/28/2015   Tobacco abuse 11/28/2015   Vertigo 11/28/2015   Hereditary and idiopathic neuropathy 11/28/2015   Hereditary and idiopathic peripheral neuropathy 09/17/2009   Disorder of peripheral nervous system 09/17/2009   Alcoholic cirrhosis of liver (HCC) 10/02/2008   Jaundice, non-neonatal  10/02/2008   Generalized abdominal pain 10/02/2008   Ascites 10/02/2008   Jaundice 10/02/2008   Esophageal varices (HCC) 04/04/2008   Esophageal varices (HCC) 04/04/2008   Alcohol abuse 03/16/2008   TRANSAMINASES, SERUM, ELEVATED 03/16/2008   Elevated levels of transaminase & lactic acid dehydrogenase 03/16/2008   SCARLET FEVER 03/14/2008   Hyperlipidemia 03/14/2008   External hemorrhoids 03/14/2008   Umbilical hernia 03/14/2008   Endometriosis 03/14/2008   DYSMENORRHEA 03/14/2008   INSOMNIA UNSPECIFIED 03/14/2008   Large liver 03/14/2008   Splenomegaly 03/14/2008   LIVER FUNCTION TESTS, ABNORMAL, HX OF 03/14/2008   History of disease of skin and subcutaneous tissue 03/14/2008   Insomnia 03/14/2008   Allergic rhinitis 05/31/2007   Gastroesophageal reflux disease 05/31/2007   Osteoarthritis 05/31/2007    PCP: Richmond Campbell., PA-C   REFERRING PROVIDER: Pincus Badder, FNP   REFERRING DIAG: M54.50 (ICD-10-CM) - Low back pain, unspecified   Rationale for Evaluation and Treatment: Rehabilitation  THERAPY DIAG:  Other low back pain  Muscle weakness (generalized)  Other abnormalities of gait and mobility  Unsteadiness on feet  ONSET DATE: 7 YRS  SUBJECTIVE:                                                                                                                                                                                           SUBJECTIVE STATEMENT: "My arms continue to be sore.  Core soreness under right ribs kinda, but I think it is good"   From evaluation:  8/15 steroid injection/ Lumbar facet arthropathy.  Will be having an ablation in Oct.  Left ankle replacement, bilat knee OA.  My feet and knees turn out.  Toes in left foot no sensation. Use cane to stop from falling. L4 ruptured disc 7 yrs ago wanted off of opioids so went with the stimulator.  Was unable to get off of opioids due to severity of pain. Back pain is high and constant.  Can't  walk far. Being seen  at [facility]   PERTINENT HISTORY:  Left ankle pain, S/P ankle replacement surgery At [facility] January 2023 Right wrist injury S/P pacemaker implant 2019 ,atrial flutter/AFib? Chronic low back pain, spinal cord stimulator 2017 Tobacco dependence UDS 03/21/2019, 02/22/20, 07/22/21, 08/11/22 Eliquis SOAPP score 5 Oswestry score 20%, 42% (04/07/2022) Pain agreement 08/11/22;02/02/23 Narcan RX back and left buttock pain; CT myelogram L3-4 spinal stenosis loss of spouse-January 2024  PAIN:  Are you having pain? Yes: NPRS scale:  7/10 Pain location: LB across L>R into hips Pain description: tender Aggravating factors: Amb > 25 yds makes my eyes tear Relieving factors: opioids, lying down  PRECAUTIONS: Other: spinal cord stimulation  RED FLAGS: None   WEIGHT BEARING RESTRICTIONS: No  FALLS:  Has patient fallen in last 6 months? No  LIVING ENVIRONMENT: Lives with: lives with their daughter Lives in: House/apartment Stairs: Yes: Internal: 15 steps; can reach both Has following equipment at home: Single point cane, Wheelchair (manual), Shower bench, Grab bars, and lift chair  OCCUPATION: disabled  PLOF: Requires assistive device for independence  PATIENT GOALS: decrease muscle tightness in LB m;  NEXT MD VISIT: sept  OBJECTIVE:   DIAGNOSTIC FINDINGS:  01/02/23 CT Lumbar Spine IMPRESSION: 1. Progressive, advanced right-sided disc degeneration at L3-4 with a prominent right-sided disc osteophyte complex resulting in moderate spinal stenosis, moderate right lateral recess stenosis, and severe right neural foraminal stenosis. 2. Progressive, severe multilevel facet arthrosis. Grade 1 anterolisthesis develops with standing at L4-5. 3. New mild spinal stenosis and mild right neural foraminal stenosis at L4-5. 4. New mild left neural foraminal stenosis at L5-S1. 5.  Aortic Atherosclerosis (ICD10-I70.0)  Left ankle 06/01/23 Impression:  1. Stable appearing left  total ankle arthroplasty. 2. Stable midfoot fusion. 3. Diffuse demineralization. 4. Hindfoot valgus.   PATIENT SURVEYS:  FOTO  40% with goal of 50% 06/26/23:  FOTO 38%  COGNITION: Overall cognitive status: Within functional limits for tasks assessed     SENSATION: IMPAIRED LEFT FOREFOOT/TOES  MUSCLE LENGTH: Hamstrings: tight   POSTURE:  bilat knee valgus deformity; bilat ankle inversion and pronation , decreased lumbar lordosis  PALPATION: No TTP in LB; bilat knee crepitus LUMBAR ROM:   AROM eval  Flexion FT to mid shin  Extension 25%P!  Right lateral flexion 25%P!  Left lateral flexion 50%P!  Right rotation   Left rotation    (Blank rows = not tested)  LOWER EXTREMITY ROM:     Active  Right eval Left eval  Hip flexion    Hip extension    Hip abduction    Hip adduction    Hip internal rotation    Hip external rotation    Knee flexion 120 120  Knee extension 0 passive 0 passive  Ankle dorsiflexion  2d  Ankle plantarflexion  21d  Ankle inversion    Ankle eversion     (Blank rows = not tested)  LOWER EXTREMITY MMT:    MMT Right eval Left eval  Hip flexion 36.6 39.8  Hip extension    Hip abduction 29.2 37.1  Hip adduction    Hip internal rotation    Hip external rotation    Knee flexion    Knee extension 40.6 34.6  Ankle dorsiflexion    Ankle plantarflexion    Ankle inversion    Ankle eversion     (Blank rows = not tested)  LUMBAR SPECIAL TESTS:  Straight leg raise test: Positive and Thomas test: Negative  FUNCTIONAL TESTS:  Timed up and go (TUG): 17.62  GAIT: Distance walked: 400 ft Assistive device utilized: Single point cane Level of assistance: Modified independence Comments: External hip rotation, feet in pronation  TODAY'S TREATMENT:                                                                                                                              Pt seen for aquatic therapy today.  Treatment took place in water 3.5-4.75  ft in depth at the [facility] pool. Temp of water was 91.  Pt entered/exited the pool via stairs independently with bilat rail.  * Unsupported walking forward/ backward  - 10 min for warm up / endurance *ue horizontal add/abd using HB x 10 *warrior 1 leading R/L x 5 *Bow&Arrow x 8 R/L * UE on yellow hand floats: single leg supermans 2x 5  * SLS with noodle stomp with hollow blue noodle x 12 each LE  hand on deck hip in neutral then ER * wide stance then staggered with TrA set with long hollow  noodle x 10 * Straddling solid noodle:  cycling with breast stroke arms,  cross country ski     Pt requires the buoyancy and hydrostatic pressure of water for support, and to offload joints by unweighting joint load by at least 50 % in navel deep water and by at least 75-80% in chest to neck deep water.  Viscosity of the water is needed for resistance of strengthening. Water current perturbations provides challenge to standing balance requiring increased core activation.     PATIENT EDUCATION:  Education details:aquatic therapy exercise rationale, modifications, progressions Person educated: Patient Education method: Explanation Education comprehension: verbalized understanding  HOME EXERCISE PROGRAM: TBA  ASSESSMENT:  CLINICAL IMPRESSION: Pt reports increased pain sensitivity throughout right shoulder and LB no specific trigger. She completes tasks well with min VC.  Does require mulriple squatted recovery periods.  Pain remains  throughout session with some relief when back floating on noodle. Her overall pain she reports has reduced although daily pain continues and can be unpredictable (due to its chronic nature).  She is scheduled to begin land based intervention later this month. She is encouraged to try to walk to setting next session pushing WC to progress towards a set goal. Plan to complete re-assess with next visit with me. Will begin creating aquatic HEP for tentative  issuance in next few visits.  Pt will be gaining membership her at [employer].        From evaluation:  Patient is a 62 y.o. f who was seen today for physical therapy evaluation and treatment for LBP.  She has a complicated Pmhx with multiple orthopedic implications; bil hip, knee and feet oa; Left total ankle replacement with hindfoot valgus; Lumbar DDD, spinal stenosis, NFS, severe multilevel facet arthrosis. Grade 1 anterolisthesis L4-5.  She has been taking narcotics for years for pain control. Her main complaint today is back pain.  She is being seen at [facility] and is planning on  a spinal nerve ablation in Oct. She presents with high pain sensitivity throughout using a cane for balance. She has significant postural dysfunction and muscle weakness, poor balance and gait deviation.  Due to the complicated nature of pts situation she will be seen in aquatics only to benefit from the properties of water to address all function, balance, strength and pain then potentially transition to land based intervention. Plan to re-assess as needed to modify or adjust goals as pt progresses   OBJECTIVE IMPAIRMENTS: Abnormal gait, decreased activity tolerance, decreased balance, decreased coordination, decreased endurance, decreased mobility, difficulty walking, decreased ROM, decreased strength, impaired flexibility, impaired sensation, postural dysfunction, and pain.   ACTIVITY LIMITATIONS: carrying, lifting, bending, sitting, standing, squatting, stairs, transfers, locomotion level, and caring for others  PARTICIPATION LIMITATIONS: meal prep, cleaning, laundry, driving, shopping, community activity, occupation, and yard work  PERSONAL FACTORS: Fitness, Past/current experiences, Time since onset of injury/illness/exacerbation, and 3+ comorbidities: see impression statement  are also affecting patient's functional outcome.   REHAB POTENTIAL: Good  CLINICAL DECISION MAKING: Unstable/unpredictable  EVALUATION  COMPLEXITY: High   GOALS: Goals reviewed with patient? Yes  SHORT TERM GOALS: Target date: 07/18/23  Pt will tolerate full aquatic sessions consistently without increase in pain and with improving function to demonstrate good toleration and effectiveness of intervention.  Baseline: Goal status: MET -06/23/23  2.  Pt will report decrease in usual pain by up to 2 NPRS for improved toleration to activity/quality of life and to demonstrate improved management of pain. Baseline: 8/10 Goal status:Met 07/08/23  3.  Pt will tolerate walking to or from setting along with tolerating full aquatic session without significant increase in pain or fatigue Baseline:  Goal status: INITIAL  4.  Pt will improve on Tug test to <or= 14s to demonstrate improvement in lower extremity function, mobility and decreased fall risk. Baseline: 17.62 Goal status: INITIAL  5.  Pt will be able to ambulate submerged ue support as needed up to 10 consecutive minutes to demonstrate improving toleration to amb. Baseline:  Goal status: Met 07/01/23    LONG TERM GOALS: Target date: 08/11/23  Pt to meet stated Foto Goal of 50% Baseline: 38% Goal status: INITIAL  2.  Pt will be indep with final HEP's (land and aquatic as appropriate) for continued management of condition  Baseline: none Goal status: INITIAL  3.  Pt will improve strength in all areas listed by  up to 10lbs to demonstrate improved overall physical function Baseline: see chart Goal status: INITIAL  4.  Pt will report decrease in worst pain to </= 6/10 for improved toleration to activity/quality of life and to demonstrate improved management of pain.  Baseline: 10/10 Goal status: INITIAL  5.  Pt will be able to amb to and from setting (400 - 500 ft each way) using AD as needed to demonstrate improved gait and endurance/aerobic capacity. Baseline: 75 ft Goal status: INITIAL  6.  Pt to improve lumbar ROM by 25% for improved function Baseline: see  chart Goal status: INITIAL  PLAN:  PT FREQUENCY: 2x/week  PT DURATION: 10 weeks  PLANNED INTERVENTIONS: Therapeutic exercises, Therapeutic activity, Neuromuscular re-education, Balance training, Gait training, Patient/Family education, Self Care, Joint mobilization, Joint manipulation, Stair training, Orthotic/Fit training, DME instructions, Aquatic Therapy, Dry Needling, Electrical stimulation, Spinal mobilization, Cryotherapy, Moist heat, Taping, Traction, Ionotophoresis 4mg /ml Dexamethasone, Manual therapy, and Re-evaluation.  PLAN FOR NEXT SESSION: aquatic: gait, endurance, strength and balance training, pain control.   Corrie Dandy Tomma Lightning) Dale Ribeiro MPT 07/13/23 11:26 AM  [facility] [address] [place], [place], [address] Phone: [phone]   Fax:  [phone]

## 2023-07-15 ENCOUNTER — Ambulatory Visit (HOSPITAL_BASED_OUTPATIENT_CLINIC_OR_DEPARTMENT_OTHER): Payer: BC Managed Care – PPO | Admitting: Physical Therapy

## 2023-07-15 DIAGNOSIS — M5459 Other low back pain: Secondary | ICD-10-CM | POA: Diagnosis not present

## 2023-07-15 DIAGNOSIS — R2681 Unsteadiness on feet: Secondary | ICD-10-CM

## 2023-07-15 DIAGNOSIS — R2689 Other abnormalities of gait and mobility: Secondary | ICD-10-CM

## 2023-07-15 DIAGNOSIS — M6281 Muscle weakness (generalized): Secondary | ICD-10-CM

## 2023-07-15 NOTE — Therapy (Signed)
OUTPATIENT PHYSICAL THERAPY THORACOLUMBAR TREATMENT   Patient Name: [name] MRN: [id number] DOB:[birth year], 62 y.o., female Today's Date: 07/15/2023  END OF SESSION:  PT End of Session - 07/15/23 1251     Visit Number 12    Number of Visits 20    Date for PT Re-Evaluation 08/11/23    Authorization Type BCBS    PT Start Time 1200    PT Stop Time 1240    PT Time Calculation (min) 40 min    Activity Tolerance Patient tolerated treatment well    Behavior During Therapy WFL for tasks assessed/performed               Past Medical History:  Diagnosis Date   Alcohol abuse    a. quit several years ago.   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   pacemaker insertion for tachy/brady syndrome   Spinal cord stimulator status    Spinal stenosis    Tobacco abuse    Past Surgical History:  Procedure Laterality Date   ANKLE SURGERY     ATRIAL FIBRILLATION ABLATION N/A 10/02/2020   Procedure: ATRIAL FIBRILLATION ABLATION;  Surgeon: Regan Lemming, MD;  Location: [facility];  Service: Cardiovascular;  Laterality: N/A;   CERVICAL CONE BIOPSY     CESAREAN SECTION     COLONOSCOPY     HERNIA REPAIR     KNEE SURGERY     x5   ORIF ANKLE FRACTURE Left 06/02/2018   Procedure: OPEN REDUCTION INTERNAL FIXATION (ORIF) ANKLE FRACTURE;  Surgeon: Toni Arthurs, MD;  Location: [facility];  Service: Orthopedics;  Laterality: Left;   ORIF ANKLE FRACTURE Left 08/26/2018   Procedure: Left ankle syndesmosis  OPEN REDUCTION INTERNAL FIXATION (ORIF)/ reconstruction of deltoid ligament;  Surgeon: Toni Arthurs, MD;  Location: [facility];  Service: Orthopedics;  Laterality: Left;   PACEMAKER IMPLANT N/A 08/05/2018   Procedure: PACEMAKER IMPLANT;  Surgeon: Regan Lemming, MD;  Location: [facility];  Service: Cardiovascular;  Laterality: N/A;   POLYPECTOMY     SPINAL CORD STIMULATOR INSERTION N/A 05/07/2016   Procedure: LUMBAR SPINAL CORD STIMULATOR INSERTION;  Surgeon: Venita Lick, MD;  Location: [facility];  Service: Orthopedics;  Laterality: N/A;   Patient Active Problem List   Diagnosis Date Noted   Tachy-brady syndrome (HCC) 08/05/2018   Closed fracture of left ankle 06/01/2018   Paroxysmal atrial fibrillation (HCC) 06/01/2018   Benign essential hypertension 06/01/2018   Atrial flutter (HCC) 06/01/2018   Near syncope 06/01/2018   Bradycardia    Posterior tibial tendon dysfunction (PTTD) of right lower extremity 03/15/2018   Primary localized osteoarthrosis of ankle and foot 01/21/2018   Long-term current use of opiate analgesic 11/10/2017   Therapeutic opioid induced constipation 10/27/2017   On long term drug therapy 10/14/2017   Chronic low back pain 10/14/2017   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) 10/14/2017   Chronic pain syndrome 10/14/2017   Pain in left foot 10/12/2017   Pain in right foot 10/12/2017   B12 deficiency 04/21/2016   Cobalamin deficiency 04/21/2016   Thrombocytopenia (HCC) 11/28/2015   Chronic pain 11/28/2015   Disease of jaw 11/28/2015   Hypokalemia 11/28/2015   Impaired fasting glucose 11/28/2015   Swelling of both lower extremities 11/28/2015   Tobacco abuse 11/28/2015   Vertigo 11/28/2015   Hereditary and idiopathic neuropathy 11/28/2015   Hereditary and idiopathic peripheral neuropathy 09/17/2009   Disorder of peripheral nervous system 09/17/2009   Alcoholic cirrhosis of liver (HCC) 10/02/2008   Jaundice, non-neonatal  10/02/2008   Generalized abdominal pain 10/02/2008   Ascites 10/02/2008   Jaundice 10/02/2008   Esophageal varices (HCC) 04/04/2008   Esophageal varices (HCC) 04/04/2008   Alcohol abuse 03/16/2008   TRANSAMINASES, SERUM, ELEVATED 03/16/2008   Elevated levels of transaminase & lactic acid dehydrogenase 03/16/2008   SCARLET FEVER 03/14/2008   Hyperlipidemia 03/14/2008   External hemorrhoids 03/14/2008   Umbilical hernia 03/14/2008   Endometriosis 03/14/2008   DYSMENORRHEA 03/14/2008   INSOMNIA UNSPECIFIED 03/14/2008   Large liver 03/14/2008   Splenomegaly 03/14/2008   LIVER FUNCTION TESTS, ABNORMAL, HX OF 03/14/2008   History of disease of skin and subcutaneous tissue 03/14/2008   Insomnia 03/14/2008   Allergic rhinitis 05/31/2007   Gastroesophageal reflux disease 05/31/2007   Osteoarthritis 05/31/2007    PCP: Richmond Campbell., PA-C   REFERRING PROVIDER: Pincus Badder, FNP   REFERRING DIAG: M54.50 (ICD-10-CM) - Low back pain, unspecified   Rationale for Evaluation and Treatment: Rehabilitation  THERAPY DIAG:  Other low back pain  Muscle weakness (generalized)  Other abnormalities of gait and mobility  Unsteadiness on feet  ONSET DATE: 7 YRS  SUBJECTIVE:                                                                                                                                                                                           SUBJECTIVE STATEMENT: "I slept until 1pm the following day" after last session.  Pt reports continued soreness in shoulders, "did too much last session with arms". Pt requests less exercises with arms today.    From evaluation:  8/15 steroid injection/ Lumbar facet arthropathy.  Will be having an ablation in Oct.  Left ankle replacement, bilat knee OA.  My feet and knees turn out.  Toes in left foot no sensation. Use cane to stop from falling. L4 ruptured disc 7 yrs ago wanted off of opioids so went with the stimulator.  Was  unable to get off of opioids due to severity  of pain. Back pain is high and constant.  Can't walk far. Being seen at [facility]   PERTINENT HISTORY:  Left ankle pain, S/P ankle replacement surgery At [facility] January 2023 Right wrist injury S/P pacemaker implant 2019 ,atrial flutter/AFib? Chronic low back pain, spinal cord stimulator 2017 Tobacco dependence UDS 03/21/2019, 02/22/20, 07/22/21, 08/11/22 Eliquis SOAPP score 5 Oswestry score 20%, 42% (04/07/2022) Pain agreement 08/11/22;02/02/23 Narcan RX back and left buttock pain; CT myelogram L3-4 spinal stenosis loss of spouse-January 2024  PAIN:  Are you having pain? Yes: NPRS scale:  6/10 Pain location: LB across L>R into hips Pain description: tender Aggravating factors: Amb > 25 yds makes my eyes tear Relieving factors: opioids, lying down  PRECAUTIONS: Other: spinal cord stimulation  RED FLAGS: None   WEIGHT BEARING RESTRICTIONS: No  FALLS:  Has patient fallen in last 6 months? No  LIVING ENVIRONMENT: Lives with: lives with their daughter Lives in: House/apartment Stairs: Yes: Internal: 15 steps; can reach both Has following equipment at home: Single point cane, Wheelchair (manual), Shower bench, Grab bars, and lift chair  OCCUPATION: disabled  PLOF: Requires assistive device for independence  PATIENT GOALS: decrease muscle tightness in LB m;  NEXT MD VISIT: sept  OBJECTIVE:   DIAGNOSTIC FINDINGS:  01/02/23 CT Lumbar Spine IMPRESSION: 1. Progressive, advanced right-sided disc degeneration at L3-4 with a prominent right-sided disc osteophyte complex resulting in moderate spinal stenosis, moderate right lateral recess stenosis, and severe right neural foraminal stenosis. 2. Progressive, severe multilevel facet arthrosis. Grade 1 anterolisthesis develops with standing at L4-5. 3. New mild spinal stenosis and mild right neural foraminal stenosis at L4-5. 4. New mild left neural foraminal stenosis at L5-S1. 5.  Aortic  Atherosclerosis (ICD10-I70.0)  Left ankle 06/01/23 Impression:  1. Stable appearing left total ankle arthroplasty. 2. Stable midfoot fusion. 3. Diffuse demineralization. 4. Hindfoot valgus.   PATIENT SURVEYS:  FOTO  40% with goal of 50% 06/26/23:  FOTO 38%  COGNITION: Overall cognitive status: Within functional limits for tasks assessed     SENSATION: IMPAIRED LEFT FOREFOOT/TOES  MUSCLE LENGTH: Hamstrings: tight   POSTURE:  bilat knee valgus deformity; bilat ankle inversion and pronation , decreased lumbar lordosis  PALPATION: No TTP in LB; bilat knee crepitus LUMBAR ROM:   AROM eval  Flexion FT to mid shin  Extension 25%P!  Right lateral flexion 25%P!  Left lateral flexion 50%P!  Right rotation   Left rotation    (Blank rows = not tested)  LOWER EXTREMITY ROM:     Active  Right eval Left eval  Hip flexion    Hip extension    Hip abduction    Hip adduction    Hip internal rotation    Hip external rotation    Knee flexion 120 120  Knee extension 0 passive 0 passive  Ankle dorsiflexion  2d  Ankle plantarflexion  21d  Ankle inversion    Ankle eversion     (Blank rows = not tested)  LOWER EXTREMITY MMT:    MMT Right eval Left eval  Hip flexion 36.6 39.8  Hip extension    Hip abduction 29.2 37.1  Hip adduction    Hip internal rotation    Hip external rotation    Knee flexion    Knee extension 40.6 34.6  Ankle dorsiflexion    Ankle plantarflexion    Ankle inversion    Ankle eversion     (Blank rows = not tested)  LUMBAR SPECIAL TESTS:  Straight leg raise test: Positive and  Thomas test: Negative  FUNCTIONAL TESTS:  Timed up and go (TUG): 17.62   GAIT: Distance walked: 400 ft Assistive device utilized: Single point cane Level of assistance: Modified independence Comments: External hip rotation, feet in pronation  TODAY'S TREATMENT:                                                                                                                               Pt seen for aquatic therapy today.  Treatment took place in water 3.5-4.75 ft in depth at the [facility] pool. Temp of water was 91.  Pt entered/exited the pool via stairs independently with bilat rail.  * supine supported float for spinal decompression * Straddling solid noodle:  cycling with breast stroke arms * UE on rails:  lateral step up x 2 R, x 2 L; forward step ups x 5 each LE * SLS with hollow noodle stomp x 10 into hip flex, x 10 into hip abdct with UE on yellow hand floats * 3 way LE stretch with hollow noodle and back against wall  * wide stance then staggered with TrA set with long hollow  noodle x 10 *warrior 1 leading R/L x 5 (lifting hollow noodle up)  * return to supported supine for spinal decompression  Pt requires the buoyancy and hydrostatic pressure of water for support, and to offload joints by unweighting joint load by at least 50 % in navel deep water and by at least 75-80% in chest to neck deep water.  Viscosity of the water is needed for resistance of strengthening. Water current perturbations provides challenge to standing balance requiring increased core activation.     PATIENT EDUCATION:  Education details:aquatic therapy exercise rationale, modifications, progressions Person educated: Patient Education method: Explanation Education comprehension: verbalized understanding  HOME EXERCISE PROGRAM: TBA  ASSESSMENT:  CLINICAL IMPRESSION: Pt ambulated to pool area from lobby pushing transport chair; reported taking a few rest breaks along the way.  Pt reported pain up to "strong 7" after completing the walk.  Pt tolerated all exercises well, with reduction of pain to 5/10 during session.  Will begin creating aquatic HEP for tentative issuance in next few visits.  Pt will be gaining membership her at [employer].    From evaluation:  Patient is a 62 y.o. f who was seen today for physical therapy evaluation and treatment for LBP.  She  has a complicated Pmhx with multiple orthopedic implications; bil hip, knee and feet oa; Left total ankle replacement with hindfoot valgus; Lumbar DDD, spinal stenosis, NFS, severe multilevel facet arthrosis. Grade 1 anterolisthesis L4-5.  She has been taking narcotics for years for pain control. Her main complaint today is back pain.  She is being seen at [facility] and is planning on a spinal nerve ablation in Oct. She presents with high pain sensitivity throughout using a cane for balance. She has significant postural dysfunction and muscle weakness, poor balance and gait deviation.  Due to  the complicated nature of pts situation she will be seen in aquatics only to benefit from the properties of water to address all function, balance, strength and pain then potentially transition to land based intervention. Plan to re-assess as needed to modify or adjust goals as pt progresses   OBJECTIVE IMPAIRMENTS: Abnormal gait, decreased activity tolerance, decreased balance, decreased coordination, decreased endurance, decreased mobility, difficulty walking, decreased ROM, decreased strength, impaired flexibility, impaired sensation, postural dysfunction, and pain.   ACTIVITY LIMITATIONS: carrying, lifting, bending, sitting, standing, squatting, stairs, transfers, locomotion level, and caring for others  PARTICIPATION LIMITATIONS: meal prep, cleaning, laundry, driving, shopping, community activity, occupation, and yard work  PERSONAL FACTORS: Fitness, Past/current experiences, Time since onset of injury/illness/exacerbation, and 3+ comorbidities: see impression statement  are also affecting patient's functional outcome.   REHAB POTENTIAL: Good  CLINICAL DECISION MAKING: Unstable/unpredictable  EVALUATION COMPLEXITY: High   GOALS: Goals reviewed with patient? Yes  SHORT TERM GOALS: Target date: 07/18/23  Pt will tolerate full aquatic sessions consistently without increase in pain and with improving function  to demonstrate good toleration and effectiveness of intervention.  Baseline: Goal status: MET -06/23/23  2.  Pt will report decrease in usual pain by up to 2 NPRS for improved toleration to activity/quality of life and to demonstrate improved management of pain. Baseline: 8/10 Goal status:Met 07/08/23  3.  Pt will tolerate walking to or from setting along with tolerating full aquatic session without significant increase in pain or fatigue Baseline:  Goal status: INITIAL  4.  Pt will improve on Tug test to <or= 14s to demonstrate improvement in lower extremity function, mobility and decreased fall risk. Baseline: 17.62 Goal status: INITIAL  5.  Pt will be able to ambulate submerged ue support as needed up to 10 consecutive minutes to demonstrate improving toleration to amb. Baseline:  Goal status: Met 07/01/23    LONG TERM GOALS: Target date: 08/11/23  Pt to meet stated Foto Goal of 50% Baseline: 38% Goal status: INITIAL  2.  Pt will be indep with final HEP's (land and aquatic as appropriate) for continued management of condition  Baseline: none Goal status: INITIAL  3.  Pt will improve strength in all areas listed by  up to 10lbs to demonstrate improved overall physical function Baseline: see chart Goal status: INITIAL  4.  Pt will report decrease in worst pain to </= 6/10 for improved toleration to activity/quality of life and to demonstrate improved management of pain.  Baseline: 10/10 Goal status: INITIAL  5.  Pt will be able to amb to and from setting (400 - 500 ft each way) using AD as needed to demonstrate improved gait and endurance/aerobic capacity. Baseline: 75 ft Goal status: INITIAL  6.  Pt to improve lumbar ROM by 25% for improved function Baseline: see chart Goal status: INITIAL  PLAN:  PT FREQUENCY: 2x/week  PT DURATION: 10 weeks  PLANNED INTERVENTIONS: Therapeutic exercises, Therapeutic activity, Neuromuscular re-education, Balance training, Gait  training, Patient/Family education, Self Care, Joint mobilization, Joint manipulation, Stair training, Orthotic/Fit training, DME instructions, Aquatic Therapy, Dry Needling, Electrical stimulation, Spinal mobilization, Cryotherapy, Moist heat, Taping, Traction, Ionotophoresis 4mg /ml Dexamethasone, Manual therapy, and Re-evaluation.  PLAN FOR NEXT SESSION: aquatic: gait, endurance, strength and balance training, pain control.   Mayer Camel, PTA 07/15/23 12:57 PM [facility] [address] [place], [place], [address] Phone: [phone]   Fax:  [phone]

## 2023-07-20 ENCOUNTER — Encounter (HOSPITAL_BASED_OUTPATIENT_CLINIC_OR_DEPARTMENT_OTHER): Payer: Self-pay | Admitting: Physical Therapy

## 2023-07-20 ENCOUNTER — Ambulatory Visit (HOSPITAL_BASED_OUTPATIENT_CLINIC_OR_DEPARTMENT_OTHER): Payer: BC Managed Care – PPO | Admitting: Physical Therapy

## 2023-07-20 DIAGNOSIS — R2681 Unsteadiness on feet: Secondary | ICD-10-CM

## 2023-07-20 DIAGNOSIS — R2689 Other abnormalities of gait and mobility: Secondary | ICD-10-CM

## 2023-07-20 DIAGNOSIS — M6281 Muscle weakness (generalized): Secondary | ICD-10-CM

## 2023-07-20 DIAGNOSIS — M5459 Other low back pain: Secondary | ICD-10-CM | POA: Diagnosis not present

## 2023-07-20 NOTE — Therapy (Signed)
OUTPATIENT PHYSICAL THERAPY THORACOLUMBAR TREATMENT   Patient Name: [name] MRN: [id number] DOB:10/06/1961, 62 y.o., female Today's Date: 07/20/2023  END OF SESSION:  PT End of Session - 07/20/23 1206     Visit Number 13    Number of Visits 20    Date for PT Re-Evaluation 08/11/23    Authorization Type BCBS    PT Start Time 1116    PT Stop Time 1200    PT Time Calculation (min) 44 min    Activity Tolerance Patient tolerated treatment well    Behavior During Therapy WFL for tasks assessed/performed                Past Medical History:  Diagnosis Date   Alcohol abuse    a. quit several years ago.   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   pacemaker insertion for tachy/brady syndrome   Spinal cord stimulator status    Spinal stenosis    Tobacco abuse    Past Surgical History:  Procedure Laterality Date   ANKLE SURGERY     ATRIAL FIBRILLATION ABLATION N/A 10/02/2020   Procedure: ATRIAL FIBRILLATION ABLATION;  Surgeon: Regan Lemming, MD;  Location: [facility];  Service: Cardiovascular;  Laterality: N/A;   CERVICAL CONE BIOPSY     CESAREAN SECTION     COLONOSCOPY     HERNIA REPAIR     KNEE SURGERY     x5   ORIF ANKLE FRACTURE Left 06/02/2018   Procedure: OPEN REDUCTION INTERNAL FIXATION (ORIF) ANKLE FRACTURE;  Surgeon: Toni Arthurs, MD;  Location: [facility];  Service: Orthopedics;  Laterality: Left;   ORIF ANKLE FRACTURE Left 08/26/2018   Procedure: Left ankle  syndesmosis OPEN REDUCTION INTERNAL FIXATION (ORIF)/ reconstruction of deltoid ligament;  Surgeon: Toni Arthurs, MD;  Location: [facility];  Service: Orthopedics;  Laterality: Left;   PACEMAKER IMPLANT N/A 08/05/2018   Procedure: PACEMAKER IMPLANT;  Surgeon: Regan Lemming, MD;  Location: [facility];  Service: Cardiovascular;  Laterality: N/A;   POLYPECTOMY     SPINAL CORD STIMULATOR INSERTION N/A 05/07/2016   Procedure: LUMBAR SPINAL CORD STIMULATOR INSERTION;  Surgeon: Venita Lick, MD;  Location: [facility];  Service: Orthopedics;  Laterality: N/A;   Patient Active Problem List   Diagnosis Date Noted   Tachy-brady syndrome (HCC) 08/05/2018   Closed fracture of left ankle 06/01/2018   Paroxysmal atrial fibrillation (HCC) 06/01/2018   Benign essential hypertension 06/01/2018   Atrial flutter (HCC) 06/01/2018   Near syncope 06/01/2018   Bradycardia    Posterior tibial tendon dysfunction (PTTD) of right lower extremity 03/15/2018   Primary localized osteoarthrosis of ankle and foot 01/21/2018   Long-term current use of opiate analgesic 11/10/2017   Therapeutic opioid induced constipation 10/27/2017   On long term drug therapy 10/14/2017   Chronic low back pain 10/14/2017   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) 10/14/2017   Chronic pain syndrome 10/14/2017   Pain in left foot 10/12/2017   Pain in right foot 10/12/2017   B12 deficiency 04/21/2016   Cobalamin deficiency 04/21/2016   Thrombocytopenia (HCC) 11/28/2015   Chronic pain 11/28/2015   Disease of jaw 11/28/2015   Hypokalemia 11/28/2015   Impaired fasting glucose 11/28/2015   Swelling of both lower extremities 11/28/2015   Tobacco abuse 11/28/2015   Vertigo 11/28/2015   Hereditary and idiopathic neuropathy 11/28/2015   Hereditary and idiopathic peripheral neuropathy 09/17/2009   Disorder of peripheral nervous system 09/17/2009   Alcoholic cirrhosis of liver (HCC) 10/02/2008   Jaundice,  non-neonatal 10/02/2008   Generalized abdominal pain 10/02/2008   Ascites 10/02/2008   Jaundice 10/02/2008   Esophageal varices (HCC) 04/04/2008   Esophageal varices (HCC) 04/04/2008   Alcohol abuse 03/16/2008   TRANSAMINASES, SERUM, ELEVATED 03/16/2008   Elevated levels of transaminase & lactic acid dehydrogenase 03/16/2008   SCARLET FEVER 03/14/2008   Hyperlipidemia 03/14/2008   External hemorrhoids 03/14/2008   Umbilical hernia 03/14/2008   Endometriosis 03/14/2008   DYSMENORRHEA 03/14/2008   INSOMNIA UNSPECIFIED 03/14/2008   Large liver 03/14/2008   Splenomegaly 03/14/2008   LIVER FUNCTION TESTS, ABNORMAL, HX OF 03/14/2008   History of disease of skin and subcutaneous tissue 03/14/2008   Insomnia 03/14/2008   Allergic rhinitis 05/31/2007   Gastroesophageal reflux disease 05/31/2007   Osteoarthritis 05/31/2007    PCP: Richmond Campbell., PA-C   REFERRING PROVIDER: Pincus Badder, FNP   REFERRING DIAG: M54.50 (ICD-10-CM) - Low back pain, unspecified   Rationale for Evaluation and Treatment: Rehabilitation  THERAPY DIAG:  Other low back pain  Muscle weakness (generalized)  Other abnormalities of gait and mobility  Unsteadiness on feet  ONSET DATE: 7 YRS  SUBJECTIVE:                                                                                                                                                                                           SUBJECTIVE STATEMENT: "I am hurting bad today a solid 8/10 my neck and shoulders as well as my LB"   From evaluation:  8/15 steroid injection/ Lumbar facet arthropathy.  Will be having an ablation in Oct.  Left ankle replacement, bilat knee OA.  My feet and knees turn out.  Toes in left foot no sensation. Use cane to stop from falling. L4 ruptured disc 7 yrs ago wanted off of opioids so went with the stimulator.  Was unable to get off of opioids due to severity of pain. Back pain is high and constant.  Can't walk  far. Being seen  at [facility]   PERTINENT HISTORY:  Left ankle pain, S/P ankle replacement surgery At [facility] 2023 Right wrist injury S/P pacemaker implant 2019 ,atrial flutter/AFib? Chronic low back pain, spinal cord stimulator 2017 Tobacco dependence UDS 03/21/2019, 02/22/20, 07/22/21, 08/11/22 Eliquis SOAPP score 5 Oswestry score 20%, 42% (04/07/2022) Pain agreement 08/11/22;02/02/23 Narcan RX back and left buttock pain; CT myelogram L3-4 spinal stenosis loss of spouse-January 2024  PAIN:  Are you having pain? Yes: NPRS scale:  8/10 Pain location: LB across L>R into hips Pain description: tender Aggravating factors: Amb > 25 yds makes my eyes tear Relieving factors: opioids, lying down  PRECAUTIONS: Other: spinal cord stimulation  RED FLAGS: None   WEIGHT BEARING RESTRICTIONS: No  FALLS:  Has patient fallen in last 6 months? No  LIVING ENVIRONMENT: Lives with: lives with their daughter Lives in: House/apartment Stairs: Yes: Internal: 15 steps; can reach both Has following equipment at home: Single point cane, Wheelchair (manual), Shower bench, Grab bars, and lift chair  OCCUPATION: disabled  PLOF: Requires assistive device for independence  PATIENT GOALS: decrease muscle tightness in LB m;  NEXT MD VISIT: sept  OBJECTIVE:   DIAGNOSTIC FINDINGS:  01/02/23 CT Lumbar Spine IMPRESSION: 1. Progressive, advanced right-sided disc degeneration at L3-4 with a prominent right-sided disc osteophyte complex resulting in moderate spinal stenosis, moderate right lateral recess stenosis, and severe right neural foraminal stenosis. 2. Progressive, severe multilevel facet arthrosis. Grade 1 anterolisthesis develops with standing at L4-5. 3. New mild spinal stenosis and mild right neural foraminal stenosis at L4-5. 4. New mild left neural foraminal stenosis at L5-S1. 5.  Aortic Atherosclerosis (ICD10-I70.0)  Left ankle 06/01/23 Impression:  1. Stable appearing left total  ankle arthroplasty. 2. Stable midfoot fusion. 3. Diffuse demineralization. 4. Hindfoot valgus.   PATIENT SURVEYS:  FOTO  40% with goal of 50% 06/26/23:  FOTO 38%  COGNITION: Overall cognitive status: Within functional limits for tasks assessed     SENSATION: IMPAIRED LEFT FOREFOOT/TOES  MUSCLE LENGTH: Hamstrings: tight   POSTURE:  bilat knee valgus deformity; bilat ankle inversion and pronation , decreased lumbar lordosis  PALPATION: No TTP in LB; bilat knee crepitus LUMBAR ROM:   AROM eval 07/20/23  Flexion FT to mid shin Full no P  Extension 25%P! 50%P!  Right lateral flexion 25%P! 75%  Left lateral flexion 50%P! 75%  Right rotation    Left rotation     (Blank rows = not tested)  LOWER EXTREMITY ROM:     Active  Right eval Left eval  Hip flexion    Hip extension    Hip abduction    Hip adduction    Hip internal rotation    Hip external rotation    Knee flexion 120 120  Knee extension 0 passive 0 passive  Ankle dorsiflexion  2d  Ankle plantarflexion  21d  Ankle inversion    Ankle eversion     (Blank rows = not tested)  LOWER EXTREMITY MMT:    MMT Right eval Left eval  Hip flexion 36.6 39.8  Hip extension    Hip abduction 29.2 37.1  Hip adduction    Hip internal rotation    Hip external rotation    Knee flexion    Knee extension 40.6 34.6  Ankle dorsiflexion    Ankle plantarflexion    Ankle inversion    Ankle eversion     (Blank rows = not tested)  LUMBAR SPECIAL TESTS:  Straight leg raise test: Positive and Thomas test: Negative  FUNCTIONAL  TESTS:  Timed up and go (TUG): 17.62   GAIT: Distance walked: 400 ft Assistive device utilized: Single point cane Level of assistance: Modified independence Comments: External hip rotation, feet in pronation  TODAY'S TREATMENT:                                                                                                                              Pt seen for aquatic therapy today.  Treatment  took place in water 3.5-4.75 ft in depth at the [facility] pool. Temp of water was 91.  Pt entered/exited the pool via stairs independently with bilat rail.  * supine supported float for spinal decompression using nek *cycling in noodle x 4 widths *Standing ue horizontal add/abd and shoulder depression * wide stance then staggered with TrA set with long hollow  noodle x 10 *Standing post shoulder stretch; chin juts; scapular retraction *warrior 1 leading R/L x 5 (lifting hollow noodle up)  * UE on rails:  lateral step up x 2 R, x 2 L; forward step ups x 5 each LE * SLS with hollow noodle stomp x 10 into hip flex, x 10 into hip abdct with UE on yellow hand floats * return to supported supine for spinal decompression  Pt requires the buoyancy and hydrostatic pressure of water for support, and to offload joints by unweighting joint load by at least 50 % in navel deep water and by at least 75-80% in chest to neck deep water.  Viscosity of the water is needed for resistance of strengthening. Water current perturbations provides challenge to standing balance requiring increased core activation.     PATIENT EDUCATION:  Education details:aquatic therapy exercise rationale, modifications, progressions Person educated: Patient Education method: Explanation Education comprehension: verbalized understanding  HOME EXERCISE PROGRAM: TBA  ASSESSMENT:  CLINICAL IMPRESSION: Pt reports increase in nerve pain last few nights. I suggested call to MD also to try decreasing level of elevation of le at night.  She VU. Progressed through exercises adding shoulder and cervical ROM and stretching to decrease pain sensitivity in area with good success. Pain reduction to 5/10 upon completion of session.  Pt demonstrates improve lumbar ROM with decreased pain and end ranges. She is progressing well towards land based goals.     From evaluation:  Patient is a 62 y.o. f who was seen today for physical  therapy evaluation and treatment for LBP.  She has a complicated Pmhx with multiple orthopedic implications; bil hip, knee and feet oa; Left total ankle replacement with hindfoot valgus; Lumbar DDD, spinal stenosis, NFS, severe multilevel facet arthrosis. Grade 1 anterolisthesis L4-5.  She has been taking narcotics for years for pain control. Her main complaint today is back pain.  She is being seen at [facility] and is planning on a spinal nerve ablation in Oct. She presents with high pain sensitivity throughout using a cane for balance. She has significant postural dysfunction and muscle weakness, poor balance and gait  deviation.  Due to the complicated nature of pts situation she will be seen in aquatics only to benefit from the properties of water to address all function, balance, strength and pain then potentially transition to land based intervention. Plan to re-assess as needed to modify or adjust goals as pt progresses   OBJECTIVE IMPAIRMENTS: Abnormal gait, decreased activity tolerance, decreased balance, decreased coordination, decreased endurance, decreased mobility, difficulty walking, decreased ROM, decreased strength, impaired flexibility, impaired sensation, postural dysfunction, and pain.   ACTIVITY LIMITATIONS: carrying, lifting, bending, sitting, standing, squatting, stairs, transfers, locomotion level, and caring for others  PARTICIPATION LIMITATIONS: meal prep, cleaning, laundry, driving, shopping, community activity, occupation, and yard work  PERSONAL FACTORS: Fitness, Past/current experiences, Time since onset of injury/illness/exacerbation, and 3+ comorbidities: see impression statement  are also affecting patient's functional outcome.   REHAB POTENTIAL: Good  CLINICAL DECISION MAKING: Unstable/unpredictable  EVALUATION COMPLEXITY: High   GOALS: Goals reviewed with patient? Yes  SHORT TERM GOALS: Target date: 07/18/23  Pt will tolerate full aquatic sessions consistently  without increase in pain and with improving function to demonstrate good toleration and effectiveness of intervention.  Baseline: Goal status: MET -06/23/23  2.  Pt will report decrease in usual pain by up to 2 NPRS for improved toleration to activity/quality of life and to demonstrate improved management of pain. Baseline: 8/10 Goal status:Met 07/08/23  3.  Pt will tolerate walking to or from setting along with tolerating full aquatic session without significant increase in pain or fatigue Baseline:  Goal status: INITIAL  4.  Pt will improve on Tug test to <or= 14s to demonstrate improvement in lower extremity function, mobility and decreased fall risk. Baseline: 17.62 Goal status: INITIAL  5.  Pt will be able to ambulate submerged ue support as needed up to 10 consecutive minutes to demonstrate improving toleration to amb. Baseline:  Goal status: Met 07/01/23    LONG TERM GOALS: Target date: 08/11/23  Pt to meet stated Foto Goal of 50% Baseline: 38% Goal status: INITIAL  2.  Pt will be indep with final HEP's (land and aquatic as appropriate) for continued management of condition  Baseline: none Goal status: INITIAL  3.  Pt will improve strength in all areas listed by  up to 10lbs to demonstrate improved overall physical function Baseline: see chart Goal status: INITIAL  4.  Pt will report decrease in worst pain to </= 6/10 for improved toleration to activity/quality of life and to demonstrate improved management of pain.  Baseline: 10/10 Goal status: INITIAL  5.  Pt will be able to amb to and from setting (400 - 500 ft each way) using AD as needed to demonstrate improved gait and endurance/aerobic capacity. Baseline: 75 ft Goal status: INITIAL  6.  Pt to improve lumbar ROM by 25% for improved function Baseline: see chart Goal status: INITIAL  PLAN:  PT FREQUENCY: 2x/week  PT DURATION: 10 weeks  PLANNED INTERVENTIONS: Therapeutic exercises, Therapeutic activity,  Neuromuscular re-education, Balance training, Gait training, Patient/Family education, Self Care, Joint mobilization, Joint manipulation, Stair training, Orthotic/Fit training, DME instructions, Aquatic Therapy, Dry Needling, Electrical stimulation, Spinal mobilization, Cryotherapy, Moist heat, Taping, Traction, Ionotophoresis 4mg /ml Dexamethasone, Manual therapy, and Re-evaluation.  PLAN FOR NEXT SESSION: aquatic: gait, endurance, strength and balance training, pain control.   Corrie Dandy [place]) Infantof Villagomez MPT 07/20/23 12:06 PM [facility] [address] [place], [place], [address] Phone: [phone]   Fax:  [phone]

## 2023-07-22 ENCOUNTER — Ambulatory Visit (HOSPITAL_BASED_OUTPATIENT_CLINIC_OR_DEPARTMENT_OTHER): Payer: BC Managed Care – PPO | Admitting: Physical Therapy

## 2023-07-22 ENCOUNTER — Encounter (HOSPITAL_BASED_OUTPATIENT_CLINIC_OR_DEPARTMENT_OTHER): Payer: Self-pay | Admitting: Physical Therapy

## 2023-07-22 ENCOUNTER — Other Ambulatory Visit: Payer: Self-pay | Admitting: Cardiovascular Disease

## 2023-07-22 ENCOUNTER — Ambulatory Visit: Payer: BC Managed Care – PPO | Attending: Cardiovascular Disease

## 2023-07-22 DIAGNOSIS — R2681 Unsteadiness on feet: Secondary | ICD-10-CM

## 2023-07-22 DIAGNOSIS — M5459 Other low back pain: Secondary | ICD-10-CM

## 2023-07-22 DIAGNOSIS — R2689 Other abnormalities of gait and mobility: Secondary | ICD-10-CM

## 2023-07-22 DIAGNOSIS — M6281 Muscle weakness (generalized): Secondary | ICD-10-CM

## 2023-07-22 NOTE — Therapy (Signed)
OUTPATIENT PHYSICAL THERAPY THORACOLUMBAR TREATMENT   Patient Name: LISSETE MAESTAS MRN: [id number] DOB:07-Mar-1961, 62 y.o., female Today's Date: 07/22/2023  END OF SESSION:  PT End of Session - 07/22/23 1047     Visit Number 14    Number of Visits 20    Date for PT Re-Evaluation 08/11/23    Authorization Type BCBS    PT Start Time 1032    PT Stop Time 1115    PT Time Calculation (min) 43 min    Activity Tolerance Patient tolerated treatment well    Behavior During Therapy WFL for tasks assessed/performed                 Past Medical History:  Diagnosis Date   Alcohol abuse    a. quit several years ago.   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   pacemaker insertion for tachy/brady syndrome   Spinal cord stimulator status    Spinal stenosis    Tobacco abuse    Past Surgical History:  Procedure Laterality Date   ANKLE SURGERY     ATRIAL FIBRILLATION ABLATION N/A 10/02/2020   Procedure: ATRIAL FIBRILLATION ABLATION;  Surgeon: Regan Lemming, MD;  Location: [facility];  Service: Cardiovascular;  Laterality: N/A;   CERVICAL CONE BIOPSY     CESAREAN SECTION     COLONOSCOPY     HERNIA REPAIR     KNEE SURGERY     x5   ORIF ANKLE FRACTURE Left 06/02/2018   Procedure: OPEN REDUCTION INTERNAL FIXATION (ORIF) ANKLE FRACTURE;  Surgeon: Toni Arthurs, MD;  Location: [facility];  Service: Orthopedics;  Laterality: Left;   ORIF ANKLE FRACTURE Left 08/26/2018   Procedure: Left ankle  syndesmosis OPEN REDUCTION INTERNAL FIXATION (ORIF)/ reconstruction of deltoid ligament;  Surgeon: Toni Arthurs, MD;  Location: [facility];  Service: Orthopedics;  Laterality: Left;   PACEMAKER IMPLANT N/A 08/05/2018   Procedure: PACEMAKER IMPLANT;  Surgeon: Regan Lemming, MD;  Location: [facility];  Service: Cardiovascular;  Laterality: N/A;   POLYPECTOMY     SPINAL CORD STIMULATOR INSERTION N/A 05/07/2016   Procedure: LUMBAR SPINAL CORD STIMULATOR INSERTION;  Surgeon: Venita Lick, MD;  Location: [facility];  Service: Orthopedics;  Laterality: N/A;   Patient Active Problem List   Diagnosis Date Noted   Tachy-brady syndrome (HCC) 08/05/2018   Closed fracture of left ankle 06/01/2018   Paroxysmal atrial fibrillation (HCC) 06/01/2018   Benign essential hypertension 06/01/2018   Atrial flutter (HCC) 06/01/2018   Near syncope 06/01/2018   Bradycardia    Posterior tibial tendon dysfunction (PTTD) of right lower extremity 03/15/2018   Primary localized osteoarthrosis of ankle and foot 01/21/2018   Long-term current use of opiate analgesic 11/10/2017   Therapeutic opioid induced constipation 10/27/2017   On long term drug therapy 10/14/2017   Chronic low back pain 10/14/2017   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) 10/14/2017   Chronic pain syndrome 10/14/2017   Pain in left foot 10/12/2017   Pain in right foot 10/12/2017   B12 deficiency 04/21/2016   Cobalamin deficiency 04/21/2016   Thrombocytopenia (HCC) 11/28/2015   Chronic pain 11/28/2015   Disease of jaw 11/28/2015   Hypokalemia 11/28/2015   Impaired fasting glucose 11/28/2015   Swelling of both lower extremities 11/28/2015   Tobacco abuse 11/28/2015   Vertigo 11/28/2015   Hereditary and idiopathic neuropathy 11/28/2015   Hereditary and idiopathic peripheral neuropathy 09/17/2009   Disorder of peripheral nervous system 09/17/2009   Alcoholic cirrhosis of liver (HCC) 10/02/2008   Jaundice,  non-neonatal 10/02/2008   Generalized abdominal pain 10/02/2008   Ascites 10/02/2008   Jaundice 10/02/2008   Esophageal varices (HCC) 04/04/2008   Esophageal varices (HCC) 04/04/2008   Alcohol abuse 03/16/2008   TRANSAMINASES, SERUM, ELEVATED 03/16/2008   Elevated levels of transaminase & lactic acid dehydrogenase 03/16/2008   SCARLET FEVER 03/14/2008   Hyperlipidemia 03/14/2008   External hemorrhoids 03/14/2008   Umbilical hernia 03/14/2008   Endometriosis 03/14/2008   DYSMENORRHEA 03/14/2008   INSOMNIA UNSPECIFIED 03/14/2008   Large liver 03/14/2008   Splenomegaly 03/14/2008   LIVER FUNCTION TESTS, ABNORMAL, HX OF 03/14/2008   History of disease of skin and subcutaneous tissue 03/14/2008   Insomnia 03/14/2008   Allergic rhinitis 05/31/2007   Gastroesophageal reflux disease 05/31/2007   Osteoarthritis 05/31/2007    PCP: Richmond Campbell., PA-C   REFERRING PROVIDER: Pincus Badder, FNP   REFERRING DIAG: M54.50 (ICD-10-CM) - Low back pain, unspecified   Rationale for Evaluation and Treatment: Rehabilitation  THERAPY DIAG:  Other low back pain  Muscle weakness (generalized)  Other abnormalities of gait and mobility  Unsteadiness on feet  ONSET DATE: 7 YRS  SUBJECTIVE:                                                                                                                                                                                           SUBJECTIVE STATEMENT: "Neck and shoulder pain went away."   From evaluation:  8/15 steroid injection/ Lumbar facet arthropathy.  Will be having an ablation in Oct.  Left ankle replacement, bilat knee OA.  My feet and knees turn out.  Toes in left foot no sensation. Use cane to stop from falling. L4 ruptured disc 7 yrs ago wanted off of opioids so went with the stimulator.  Was unable to get off of opioids due to severity of pain. Back pain is high and constant.  Can't walk far. Being seen at [facility]   PERTINENT  HISTORY:  Left ankle pain,  S/P ankle replacement surgery [facility] January 2023 Right wrist injury S/P pacemaker implant 2019 ,atrial flutter/AFib? Chronic low back pain, spinal cord stimulator 2017 Tobacco dependence UDS 03/21/2019, 02/22/20, 07/22/21, 08/11/22 Eliquis SOAPP score 5 Oswestry score 20%, 42% (04/07/2022) Pain agreement 08/11/22;02/02/23 Narcan RX back and left buttock pain; CT myelogram L3-4 spinal stenosis loss of spouse-January 2024  PAIN:  Are you having pain? Yes: NPRS scale:  5/10 Pain location: LB across L>R into hips Pain description: tender Aggravating factors: Amb > 25 yds makes my eyes tear Relieving factors: opioids, lying down  PRECAUTIONS: Other: spinal cord stimulation  RED FLAGS: None   WEIGHT BEARING RESTRICTIONS: No  FALLS:  Has patient fallen in last 6 months? No  LIVING ENVIRONMENT: Lives with: lives with their daughter Lives in: House/apartment Stairs: Yes: Internal: 15 steps; can reach both Has following equipment at home: Single point cane, Wheelchair (manual), Shower bench, Grab bars, and lift chair  OCCUPATION: disabled  PLOF: Requires assistive device for independence  PATIENT GOALS: decrease muscle tightness in LB m;  NEXT MD VISIT: sept  OBJECTIVE:   DIAGNOSTIC FINDINGS:  01/02/23 CT Lumbar Spine IMPRESSION: 1. Progressive, advanced right-sided disc degeneration at L3-4 with a prominent right-sided disc osteophyte complex resulting in moderate spinal stenosis, moderate right lateral recess stenosis, and severe right neural foraminal stenosis. 2. Progressive, severe multilevel facet arthrosis. Grade 1 anterolisthesis develops with standing at L4-5. 3. New mild spinal stenosis and mild right neural foraminal stenosis at L4-5. 4. New mild left neural foraminal stenosis at L5-S1. 5.  Aortic Atherosclerosis (ICD10-I70.0)  Left ankle 06/01/23 Impression:  1. Stable appearing left total ankle arthroplasty. 2. Stable midfoot  fusion. 3. Diffuse demineralization. 4. Hindfoot valgus.   PATIENT SURVEYS:  FOTO  40% with goal of 50% 06/26/23:  FOTO 38%  COGNITION: Overall cognitive status: Within functional limits for tasks assessed     SENSATION: IMPAIRED LEFT FOREFOOT/TOES  MUSCLE LENGTH: Hamstrings: tight   POSTURE:  bilat knee valgus deformity; bilat ankle inversion and pronation , decreased lumbar lordosis  PALPATION: No TTP in LB; bilat knee crepitus LUMBAR ROM:   AROM eval 07/20/23  Flexion FT to mid shin Full no P  Extension 25%P! 50%P!  Right lateral flexion 25%P! 75%  Left lateral flexion 50%P! 75%  Right rotation    Left rotation     (Blank rows = not tested)  LOWER EXTREMITY ROM:     Active  Right eval Left eval  Hip flexion    Hip extension    Hip abduction    Hip adduction    Hip internal rotation    Hip external rotation    Knee flexion 120 120  Knee extension 0 passive 0 passive  Ankle dorsiflexion  2d  Ankle plantarflexion  21d  Ankle inversion    Ankle eversion     (Blank rows = not tested)  LOWER EXTREMITY MMT:    MMT Right eval Left eval  Hip flexion 36.6 39.8  Hip extension    Hip abduction 29.2 37.1  Hip adduction    Hip internal rotation    Hip external rotation    Knee flexion    Knee extension 40.6 34.6  Ankle dorsiflexion    Ankle plantarflexion    Ankle inversion    Ankle eversion     (Blank rows = not tested)  LUMBAR SPECIAL TESTS:  Straight leg raise test: Positive and Thomas test: Negative  FUNCTIONAL TESTS:  Timed up and go (TUG): 17.62  GAIT: Distance walked: 400 ft Assistive device utilized: Single point cane Level of assistance: Modified independence Comments: External hip rotation, feet in pronation  TODAY'S TREATMENT:                                                                                                                              Pt seen for aquatic therapy today.  Treatment took place in water 3.5-4.75 ft in  depth at the [facility] pool. Temp of water was 91.  Pt entered/exited the pool via stairs independently with bilat rail.  * supine supported float for spinal decompression using nek *cycling in noodle x 4 widths *Standing ue horizontal add/abd *warrior 1 leading R/L x 5 (lifting hollow noodle up)  * wide stance then staggered with TrA set with long hollow  noodle x 10 *Standing post shoulder stretch; chin juts; scapular retraction * return to supported supine for spinal decompression  Pt requires the buoyancy and hydrostatic pressure of water for support, and to offload joints by unweighting joint load by at least 50 % in navel deep water and by at least 75-80% in chest to neck deep water.  Viscosity of the water is needed for resistance of strengthening. Water current perturbations provides challenge to standing balance requiring increased core activation.     PATIENT EDUCATION:  Education details:aquatic therapy exercise rationale, modifications, progressions Person educated: Patient Education method: Explanation Education comprehension: verbalized understanding  HOME EXERCISE PROGRAM: Access Code: [email or web address] URL: [email or web address] Date: 07/22/2023 Prepared by: Geni Bers  This aquatic home exercise program from [employer] utilizes pictures from land based exercises, but has been adapted prior to lamination and issuance.   Exercises - Noodle press  - 1 x daily - 1-3 x weekly - 1-3 sets - 10 reps - Drawing Bow  - 1 x daily - 1-3 x weekly - 1-3 sets - 10 reps - Walking  - Seated Straddle on [employer] Breast Stroke Arms and Bicycle Legs  - Noodle Stomp  - 1 x daily - 71-3 x weekly - 1-3 sets - 10 reps - Warrior I in [employer] with [employer]  - 1 x daily - 1-3 x weekly - 1-3 sets - 10 reps - Side to Side Hamstring Stretch with Noodle at [employer]  - 1 x daily - 71-3 x weekly - 1 sets - 3 reps - 10 hold  ASSESSMENT:  CLINICAL  IMPRESSION: Pt finishing up last aquatic session.  She has been instructed on HEP.  Copy to be laminated and issued.  She completes all exercises with good toleration.  She is ready for progression to land based intervention.  She is planning on gaining membership here at [employer]. She has only been able to tolerate walking to setting x 1 with reports of increased pain so goal not met. She reports her usual pain has decreased to 4-5/10 which is improvement form 8>/10. Discussed process on gaining a power scooter/WC.  She  will be having a nerve ablation later this month in lumbar spine. She continues to be a good candidate for PT to progress towards all land based goals improving functional mobility and decreasing pain.      From evaluation:  Patient is a 62 y.o. f who was seen today for physical therapy evaluation and treatment for LBP.  She has a complicated Pmhx with multiple orthopedic implications; bil hip, knee and feet oa; Left total ankle replacement with hindfoot valgus; Lumbar DDD, spinal stenosis, NFS, severe multilevel facet arthrosis. Grade 1 anterolisthesis L4-5.  She has been taking narcotics for years for pain control. Her main complaint today is back pain.  She is being seen at [facility] and is planning on a spinal nerve ablation in Oct. She presents with high pain sensitivity throughout using a cane for balance. She has significant postural dysfunction and muscle weakness, poor balance and gait deviation.  Due to the complicated nature of pts situation she will be seen in aquatics only to benefit from the properties of water to address all function, balance, strength and pain then potentially transition to land based intervention. Plan to re-assess as needed to modify or adjust goals as pt progresses   OBJECTIVE IMPAIRMENTS: Abnormal gait, decreased activity tolerance, decreased balance, decreased coordination, decreased endurance, decreased mobility, difficulty walking, decreased ROM,  decreased strength, impaired flexibility, impaired sensation, postural dysfunction, and pain.   ACTIVITY LIMITATIONS: carrying, lifting, bending, sitting, standing, squatting, stairs, transfers, locomotion level, and caring for others  PARTICIPATION LIMITATIONS: meal prep, cleaning, laundry, driving, shopping, community activity, occupation, and yard work  PERSONAL FACTORS: Fitness, Past/current experiences, Time since onset of injury/illness/exacerbation, and 3+ comorbidities: see impression statement  are also affecting patient's functional outcome.   REHAB POTENTIAL: Good  CLINICAL DECISION MAKING: Unstable/unpredictable  EVALUATION COMPLEXITY: High   GOALS: Goals reviewed with patient? Yes  SHORT TERM GOALS: Target date: 07/18/23  Pt will tolerate full aquatic sessions consistently without increase in pain and with improving function to demonstrate good toleration and effectiveness of intervention.  Baseline: Goal status: MET -06/23/23  2.  Pt will report decrease in usual pain by up to 2 NPRS for improved toleration to activity/quality of life and to demonstrate improved management of pain. Baseline: 8/10 Goal status:Met 07/08/23  3.  Pt will tolerate walking to or from setting along with tolerating full aquatic session without significant increase in pain or fatigue Baseline:  Goal status: Not Met 07/22/23  4.  Pt will improve on Tug test to <or= 14s to demonstrate improvement in lower extremity function, mobility and decreased fall risk. Baseline: 17.62 Goal status: INITIAL  5.  Pt will be able to ambulate submerged ue support as needed up to 10 consecutive minutes to demonstrate improving toleration to amb. Baseline:  Goal status: Met 07/01/23    LONG TERM GOALS: Target date: 08/11/23  Pt to meet stated Foto Goal of 50% Baseline: 38% Goal status: INITIAL  2.  Pt will be indep with final HEP's (land and aquatic as appropriate) for continued management of condition   Baseline: none Goal status: (Aquatic MET). In progress 07/22/23  3.  Pt will improve strength in all areas listed by  up to 10lbs to demonstrate improved overall physical function Baseline: see chart Goal status: INITIAL  4.  Pt will report decrease in worst pain to </= 6/10 for improved toleration to activity/quality of life and to demonstrate improved management of pain.  Baseline: 10/10 Goal status: INITIAL  5.  Pt will  be able to amb to and from setting (400 - 500 ft each way) using AD as needed to demonstrate improved gait and endurance/aerobic capacity. Baseline: 75 ft Goal status: INITIAL  6.  Pt to improve lumbar ROM by 25% for improved function Baseline: see chart Goal status: INITIAL  PLAN:  PT FREQUENCY: 2x/week  PT DURATION: 10 weeks  PLANNED INTERVENTIONS: Therapeutic exercises, Therapeutic activity, Neuromuscular re-education, Balance training, Gait training, Patient/Family education, Self Care, Joint mobilization, Joint manipulation, Stair training, Orthotic/Fit training, DME instructions, Aquatic Therapy, Dry Needling, Electrical stimulation, Spinal mobilization, Cryotherapy, Moist heat, Taping, Traction, Ionotophoresis 4mg /ml Dexamethasone, Manual therapy, and Re-evaluation.  PLAN FOR NEXT SESSION: Land: plz complete TUG; progress gait training, Le and core strengthening, HEP.   Corrie Dandy [place]) Akua Blethen MPT 07/22/23 12:53 PM [facility] [address] [place], [place], [address] Phone: [phone]   Fax:  [phone]

## 2023-07-23 LAB — BASIC METABOLIC PANEL
BUN/Creatinine Ratio: 11 — ABNORMAL LOW (ref 12–28)
BUN: 13 mg/dL (ref 8–27)
CO2: 21 mmol/L (ref 20–29)
Calcium: 10 mg/dL (ref 8.7–10.3)
Chloride: 101 mmol/L (ref 96–106)
Creatinine, Ser: 1.15 mg/dL — ABNORMAL HIGH (ref 0.57–1.00)
Glucose: 112 mg/dL — ABNORMAL HIGH (ref 70–99)
Potassium: 5 mmol/L (ref 3.5–5.2)
Sodium: 141 mmol/L (ref 134–144)
eGFR: 54 mL/min/{1.73_m2} — ABNORMAL LOW (ref >59)

## 2023-07-23 LAB — PRO B NATRIURETIC PEPTIDE: NT-Pro BNP: 331 pg/mL — ABNORMAL HIGH (ref 0–287)

## 2023-07-27 ENCOUNTER — Encounter (HOSPITAL_BASED_OUTPATIENT_CLINIC_OR_DEPARTMENT_OTHER): Payer: Self-pay

## 2023-07-27 ENCOUNTER — Ambulatory Visit (HOSPITAL_BASED_OUTPATIENT_CLINIC_OR_DEPARTMENT_OTHER): Payer: BC Managed Care – PPO | Admitting: Physical Therapy

## 2023-07-28 ENCOUNTER — Telehealth: Payer: Self-pay

## 2023-07-28 DIAGNOSIS — R7989 Other specified abnormal findings of blood chemistry: Secondary | ICD-10-CM

## 2023-07-28 DIAGNOSIS — I420 Dilated cardiomyopathy: Secondary | ICD-10-CM

## 2023-07-28 DIAGNOSIS — I5022 Chronic systolic (congestive) heart failure: Secondary | ICD-10-CM

## 2023-07-28 MED ORDER — LOSARTAN POTASSIUM-HCTZ 50-12.5 MG PO TABS: 1.0000 | ORAL_TABLET | Freq: Every day | ORAL | 3 refills | Status: DC

## 2023-07-28 NOTE — Telephone Encounter (Signed)
The patient has been notified of the result and verbalized understanding.  All questions (if any) were answered. Ethelda Chick, RN 07/28/2023 4:15 PM   Patient will come in on 08/18/23 for lab work. Updated patient's medication list.

## 2023-07-28 NOTE — Telephone Encounter (Signed)
-----   Message from Charlton Haws sent at 07/28/2023  2:53 PM EDT ----- BNP minimally up Cr ok change cozaar to hyzaar lowest dose 50/12.5 mg  f/u BMET/BNP in 3 weeks

## 2023-08-02 ENCOUNTER — Encounter: Payer: Self-pay | Admitting: Cardiovascular Disease

## 2023-08-03 ENCOUNTER — Ambulatory Visit (HOSPITAL_BASED_OUTPATIENT_CLINIC_OR_DEPARTMENT_OTHER): Payer: BC Managed Care – PPO | Admitting: Physical Therapy

## 2023-08-03 DIAGNOSIS — R2689 Other abnormalities of gait and mobility: Secondary | ICD-10-CM

## 2023-08-03 DIAGNOSIS — M6281 Muscle weakness (generalized): Secondary | ICD-10-CM

## 2023-08-03 DIAGNOSIS — M5459 Other low back pain: Secondary | ICD-10-CM

## 2023-08-03 DIAGNOSIS — R2681 Unsteadiness on feet: Secondary | ICD-10-CM

## 2023-08-03 NOTE — Therapy (Signed)
OUTPATIENT PHYSICAL THERAPY THORACOLUMBAR TREATMENT   Patient Name: [name] MRN: [id number] DOB:1961/05/05, 62 y.o., female Today's Date: 08/04/2023  END OF SESSION:  PT End of Session - 08/03/23 1156     Visit Number 15    Number of Visits 20    Date for PT Re-Evaluation 08/11/23    Authorization Type BCBS    PT Start Time 1154    PT Stop Time 1237    PT Time Calculation (min) 43 min    Activity Tolerance Patient tolerated treatment well    Behavior During Therapy WFL for tasks assessed/performed                  Past Medical History:  Diagnosis Date   Alcohol abuse    a. quit several years ago.   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   pacemaker insertion for tachy/brady syndrome   Spinal cord stimulator status    Spinal stenosis    Tobacco abuse    Past Surgical History:  Procedure Laterality Date   ANKLE SURGERY     ATRIAL FIBRILLATION ABLATION N/A 10/02/2020   Procedure: ATRIAL FIBRILLATION ABLATION;  Surgeon: Regan Lemming, MD;  Location: [facility];  Service: Cardiovascular;  Laterality: N/A;   CERVICAL CONE BIOPSY     CESAREAN SECTION     COLONOSCOPY     HERNIA REPAIR     KNEE SURGERY     x5   ORIF ANKLE FRACTURE Left 06/02/2018   Procedure: OPEN REDUCTION INTERNAL FIXATION (ORIF) ANKLE FRACTURE;  Surgeon: Toni Arthurs, MD;  Location: [facility];  Service: Orthopedics;  Laterality: Left;   ORIF ANKLE FRACTURE Left 08/26/2018   Procedure: Left ankle  syndesmosis OPEN REDUCTION INTERNAL FIXATION (ORIF)/ reconstruction of deltoid ligament;  Surgeon: Toni Arthurs, MD;  Location: [facility];  Service: Orthopedics;  Laterality: Left;   PACEMAKER IMPLANT N/A 08/05/2018   Procedure: PACEMAKER IMPLANT;  Surgeon: Regan Lemming, MD;  Location: [facility];  Service: Cardiovascular;  Laterality: N/A;   POLYPECTOMY     SPINAL CORD STIMULATOR INSERTION N/A 05/07/2016   Procedure: LUMBAR SPINAL CORD STIMULATOR INSERTION;  Surgeon: Venita Lick, MD;  Location: [facility];  Service: Orthopedics;  Laterality: N/A;   Patient Active Problem List   Diagnosis Date Noted   Tachy-brady syndrome (HCC) 08/05/2018   Closed fracture of left ankle 06/01/2018   Paroxysmal atrial fibrillation (HCC) 06/01/2018   Benign essential hypertension 06/01/2018   Atrial flutter (HCC) 06/01/2018   Near syncope 06/01/2018   Bradycardia    Posterior tibial tendon dysfunction (PTTD) of right lower extremity 03/15/2018   Primary localized osteoarthrosis of ankle and foot 01/21/2018   Long-term current use of opiate analgesic 11/10/2017   Therapeutic opioid induced constipation 10/27/2017   On long term drug therapy 10/14/2017   Chronic low back pain 10/14/2017   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) 10/14/2017   Chronic pain syndrome 10/14/2017   Pain in left foot 10/12/2017   Pain in right foot 10/12/2017   B12 deficiency 04/21/2016   Cobalamin deficiency 04/21/2016   Thrombocytopenia (HCC) 11/28/2015   Chronic pain 11/28/2015   Disease of jaw 11/28/2015   Hypokalemia 11/28/2015   Impaired fasting glucose 11/28/2015   Swelling of both lower extremities 11/28/2015   Tobacco abuse 11/28/2015   Vertigo 11/28/2015   Hereditary and idiopathic neuropathy 11/28/2015   Hereditary and idiopathic peripheral neuropathy 09/17/2009   Disorder of peripheral nervous system 09/17/2009   Alcoholic cirrhosis of liver (HCC) 10/02/2008   Jaundice,  non-neonatal 10/02/2008   Generalized abdominal pain 10/02/2008   Ascites 10/02/2008   Jaundice 10/02/2008   Esophageal varices (HCC) 04/04/2008   Esophageal varices (HCC) 04/04/2008   Alcohol abuse 03/16/2008   TRANSAMINASES, SERUM, ELEVATED 03/16/2008   Elevated levels of transaminase & lactic acid dehydrogenase 03/16/2008   SCARLET FEVER 03/14/2008   Hyperlipidemia 03/14/2008   External hemorrhoids 03/14/2008   Umbilical hernia 03/14/2008   Endometriosis 03/14/2008   DYSMENORRHEA 03/14/2008   INSOMNIA UNSPECIFIED 03/14/2008   Large liver 03/14/2008   Splenomegaly 03/14/2008   LIVER FUNCTION TESTS, ABNORMAL, HX OF 03/14/2008   History of disease of skin and subcutaneous tissue 03/14/2008   Insomnia 03/14/2008   Allergic rhinitis 05/31/2007   Gastroesophageal reflux disease 05/31/2007   Osteoarthritis 05/31/2007    PCP: Richmond Campbell., PA-C   REFERRING PROVIDER: Pincus Badder, FNP   REFERRING DIAG: M54.50 (ICD-10-CM) - Low back pain, unspecified   Rationale for Evaluation and Treatment: Rehabilitation  THERAPY DIAG:  Other low back pain  Muscle weakness (generalized)  Other abnormalities of gait and mobility  Unsteadiness on feet  ONSET DATE: 7 YRS  SUBJECTIVE:                                                                                                                                                                                           SUBJECTIVE STATEMENT: Pt states the pool was awesome.  Pt states she felt amazing in the pool.  Pt states her L ankle was bothering her due to the weather change.  Pt is having a lumbar ablation on November 14th.    From evaluation:  8/15 steroid injection/ Lumbar facet arthropathy.  Will be having an ablation in Oct.  Left ankle replacement, bilat knee OA.  My feet and knees turn out.  Toes in left foot no sensation. Use cane to stop from falling. L4 ruptured disc 7 yrs ago wanted off of opioids so went with the  stimulator.  Was unable to get off of opioids due to severity of pain. Back pain is high and constant.  Can't walk far. Being [facility]   PERTINENT HISTORY:  Left ankle pain, S/P ankle replacement surgery [facility] 2023 Right wrist injury S/P pacemaker implant 2019 ,atrial flutter/AFib? Chronic low back pain, spinal cord stimulator 2017 Tobacco dependence UDS 03/21/2019, 02/22/20, 07/22/21, 08/11/22 Eliquis SOAPP score 5 Oswestry score 20%, 42% (04/07/2022) Pain agreement 08/11/22;02/02/23 Narcan RX back and left buttock pain; CT myelogram L3-4 spinal stenosis loss of spouse-January 2024  PAIN:  Are you having pain? Yes: NPRS scale:  6/10 Pain location:  lumbar and bilat knees Pain description: tender Aggravating factors: Amb > 25 yds makes my eyes tear Relieving factors: opioids, lying down  PRECAUTIONS: Other: spinal cord stimulation  RED FLAGS: None   WEIGHT BEARING RESTRICTIONS: No  FALLS:  Has patient fallen in last 6 months? No  LIVING ENVIRONMENT: Lives with: lives with their daughter Lives in: House/apartment Stairs: Yes: Internal: 15 steps; can reach both Has following equipment at home: Single point cane, Wheelchair (manual), Shower bench, Grab bars, and lift chair  OCCUPATION: disabled  PLOF: Requires assistive device for independence  PATIENT GOALS: decrease muscle tightness in LB m;  NEXT MD VISIT: sept  OBJECTIVE:   DIAGNOSTIC FINDINGS:  01/02/23 CT Lumbar Spine IMPRESSION: 1. Progressive, advanced right-sided disc degeneration at L3-4 with a prominent right-sided disc osteophyte complex resulting in moderate spinal stenosis, moderate right lateral recess stenosis, and severe right neural foraminal stenosis. 2. Progressive, severe multilevel facet arthrosis. Grade 1 anterolisthesis develops with standing at L4-5. 3. New mild spinal stenosis and mild right neural foraminal stenosis at L4-5. 4. New mild left neural foraminal stenosis at  L5-S1. 5.  Aortic Atherosclerosis (ICD10-I70.0)  Left ankle 06/01/23 Impression:  1. Stable appearing left total ankle arthroplasty. 2. Stable midfoot fusion. 3. Diffuse demineralization. 4. Hindfoot valgus.     TODAY'S TREATMENT:                                                                                                                              TUG:  17.07 sec with SPC  PT educated pt in correct performance and contraction of TrA.  Pt performed supine TrA contraction with and without 5 sec holds.   Tra with marching 12-15 reps Supine heel slides with TrA   PPT 2x10 Clamshells in supine and seated x 10 and x 7 respectively Supine manual HS stretch 2x20-30 sec  Pt received a HEP handout and was educated in correct form and appropriate frequency.   PATIENT EDUCATION:  Education details:  POC, HEP, exercise form, and exercise rationale Person educated: Patient Education method: Explanation, demonstration, verbal cues Education comprehension: verbalized understanding, returned demonstration, verbal cues required  HOME EXERCISE PROGRAM: Access Code: [email or web address] URL: [email or web address] Date: 07/22/2023 Prepared by: Geni Bers  This aquatic home exercise program from Med[employer] utilizes pictures from land based exercises, but has been  adapted prior to lamination and issuance.   Exercises - Noodle press  - 1 x daily - 1-3 x weekly - 1-3 sets - 10 reps - Drawing Bow  - 1 x daily - 1-3 x weekly - 1-3 sets - 10 reps - Walking  - Seated Straddle on [employer] Breast Stroke Arms and Bicycle Legs  - Noodle Stomp  - 1 x daily - 71-3 x weekly - 1-3 sets - 10 reps - Warrior I in [employer] with [employer]  - 1 x daily - 1-3 x weekly - 1-3 sets - 10 reps - Side to Side Hamstring Stretch with Noodle at [employer]  - 1 x daily - 71-3 x weekly - 1 sets - 3 reps - 10 hold  Updated HEP: Access Code: [email or web address] URL: [email or web address] Date:  08/03/2023 Prepared by: Aaron Edelman  Exercises - Supine Transversus Abdominis Bracing - Hands on Stomach  - 2 x daily - 7 x weekly - 2 sets - 10 reps - 5 seconds hold - Supine March  - 1 x daily - 7 x weekly - 2 sets - 10 reps - Supine Posterior Pelvic Tilt  - 2 x daily - 7 x weekly - 2 sets - 10 reps  ASSESSMENT:  CLINICAL IMPRESSION: PT established land based HEP today.  She received a HEP handout and demonstrates good understanding.  PT did not include heel slides due to knee discomfort.  Pt performed exercises well with instruction in correct form.  She felt more comfortable with seated clamshells than supine clamshells.  Pt performed TUG today and she had no significant change though was .6 seconds faster today.  Pt responded well to Rx having no c/o's after Rx.  She should benefit from cont skilled PT services to address impairments and goals and to improve core strength and function.     OBJECTIVE IMPAIRMENTS: Abnormal gait, decreased activity tolerance, decreased balance, decreased coordination, decreased endurance, decreased mobility, difficulty walking, decreased ROM, decreased strength, impaired flexibility, impaired sensation, postural dysfunction, and pain.   ACTIVITY LIMITATIONS: carrying, lifting, bending, sitting, standing, squatting, stairs, transfers, locomotion level, and caring for others  PARTICIPATION LIMITATIONS: meal prep, cleaning, laundry, driving, shopping, community activity, occupation, and yard work  PERSONAL FACTORS: Fitness, Past/current experiences, Time since onset of injury/illness/exacerbation, and 3+ comorbidities: see impression statement  are also affecting patient's functional outcome.   REHAB POTENTIAL: Good  CLINICAL DECISION MAKING: Unstable/unpredictable  EVALUATION COMPLEXITY: High   GOALS: Goals reviewed with patient? Yes  SHORT TERM GOALS: Target date: 07/18/23  Pt will tolerate full aquatic sessions consistently without increase in  pain and with improving function to demonstrate good toleration and effectiveness of intervention.  Baseline: Goal status: MET -06/23/23  2.  Pt will report decrease in usual pain by up to 2 NPRS for improved toleration to activity/quality of life and to demonstrate improved management of pain. Baseline: 8/10 Goal status:Met 07/08/23  3.  Pt will tolerate walking to or from setting along with tolerating full aquatic session without significant increase in pain or fatigue Baseline:  Goal status: Not Met 07/22/23  4.  Pt will improve on Tug test to <or= 14s to demonstrate improvement in lower extremity function, mobility and decreased fall risk. Baseline: 17.62 Goal status: INITIAL  5.  Pt will be able to ambulate submerged ue support as needed up to 10 consecutive minutes to demonstrate improving toleration to amb. Baseline:  Goal status: Met 07/01/23    LONG TERM GOALS: Target date:  08/11/23  Pt to meet stated Foto Goal of 50% Baseline: 38% Goal status: INITIAL  2.  Pt will be indep with final HEP's (land and aquatic as appropriate) for continued management of condition  Baseline: none Goal status: (Aquatic MET). In progress 07/22/23  3.  Pt will improve strength in all areas listed by  up to 10lbs to demonstrate improved overall physical function Baseline: see chart Goal status: INITIAL  4.  Pt will report decrease in worst pain to </= 6/10 for improved toleration to activity/quality of life and to demonstrate improved management of pain.  Baseline: 10/10 Goal status: INITIAL  5.  Pt will be able to amb to and from setting (400 - 500 ft each way) using AD as needed to demonstrate improved gait and endurance/aerobic capacity. Baseline: 75 ft Goal status: INITIAL  6.  Pt to improve lumbar ROM by 25% for improved function Baseline: see chart Goal status: INITIAL  PLAN:  PT FREQUENCY: 2x/week  PT DURATION: 10 weeks  PLANNED INTERVENTIONS: Therapeutic exercises,  Therapeutic activity, Neuromuscular re-education, Balance training, Gait training, Patient/Family education, Self Care, Joint mobilization, Joint manipulation, Stair training, Orthotic/Fit training, DME instructions, Aquatic Therapy, Dry Needling, Electrical stimulation, Spinal mobilization, Cryotherapy, Moist heat, Taping, Traction, Ionotophoresis 4mg /ml Dexamethasone, Manual therapy, and Re-evaluation.  PLAN FOR NEXT SESSION: Land: progress gait training, Le and core strengthening, HEP.   Audie Clear III PT, DPT 08/04/23 2:49 PM

## 2023-08-04 ENCOUNTER — Encounter (HOSPITAL_BASED_OUTPATIENT_CLINIC_OR_DEPARTMENT_OTHER): Payer: Self-pay | Admitting: Physical Therapy

## 2023-08-10 ENCOUNTER — Ambulatory Visit (INDEPENDENT_AMBULATORY_CARE_PROVIDER_SITE_OTHER): Payer: BC Managed Care – PPO

## 2023-08-10 ENCOUNTER — Ambulatory Visit: Payer: BC Managed Care – PPO | Admitting: Student

## 2023-08-10 ENCOUNTER — Ambulatory Visit (HOSPITAL_BASED_OUTPATIENT_CLINIC_OR_DEPARTMENT_OTHER): Payer: BC Managed Care – PPO | Admitting: Physical Therapy

## 2023-08-10 DIAGNOSIS — I495 Sick sinus syndrome: Secondary | ICD-10-CM

## 2023-08-11 NOTE — Progress Notes (Signed)
CARDIOLOGY CONSULT NOTE       Patient ID: [name] MRN: [id number] DOB/AGE: 61-20-1962 61 y.o.  Admit date: (Not on file) Referring Physician: Elberta Fortis Primary Physician: Richmond Campbell., PA-C Primary Cardiologist: Elberta Fortis new to me last seen > 4 years ago Reason for Consultation: CHF   HPI:  62 y.o. new to me Followed by EP for tachy brady with [employer] PPM and PAF prior ablation 09/2020 [place] PAF on PaceArt < 1% CHADVASC 2 Echo done 04/29/23 with EF 45-50% no defined RWMA no significant valve dx Calcium score prior to ablation 09/26/20 was 147 which was 94 th percentile for age/sex Myovue done 07/28/18 with no perfusion  defect EF 43% with LBBB She has been Rx with losartan hydrochlorothiazide and Toprol as well as aldactone She is on thyroid replacement   Doing well no overt CHF Discussed entresto but she just started Hyzaar and its not expensive. Smoking < 1 ppd discussed lung cancer CT    ROS All other systems reviewed and negative except as noted above  Past Medical History:  Diagnosis Date   Alcohol abuse    a. quit several years ago.   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   pacemaker insertion for tachy/brady syndrome   Spinal cord stimulator status    Spinal stenosis    Tobacco abuse     Family History  Adopted: Yes  Family history unknown: Yes    Social History   Socioeconomic History   Marital status: Widowed    Spouse name: Not on file   Number of children: Not on file   Years  of education: Not on file   Highest education level: Not on file  Occupational History   Not on file  Tobacco Use   Smoking status: Every Day    Current packs/day: 1.00    Average packs/day: 1 pack/day for 30.0 years (30.0 ttl pk-yrs)    Types: Cigarettes   Smokeless tobacco: Never  Vaping Use   Vaping status: Never Used  Substance and Sexual Activity   Alcohol use: Not Currently    Comment: Prior alcohol abuse - quit several years ago.   Drug use: No   Sexual activity: Yes    Birth control/protection: Post-menopausal  Other Topics Concern   Not on file  Social History Narrative   Not on file   Social Determinants of Health   Financial Resource Strain: Medium Risk (04/13/2023)   [facility]   Overall Financial Resource Strain (CARDIA)    Difficulty of Paying Living Expenses: Somewhat hard  Food Insecurity: Low Risk  (06/18/2023)   Received from [facility]   Hunger Vital Sign    Worried About Running Out of Food in the Last Year: Never true    Ran Out of Food in the Last Year: Never true  Transportation Needs: No Transportation Needs (06/18/2023)   Received [facility]   Transportation    In the past 12 months, has lack of reliable transportation kept you from medical appointments, meetings, work or from getting things needed for daily living? : No  Physical Activity: Unknown (06/10/2022)   [facility] visits prior to 12/06/2022., [facility] visits prior to 12/06/2022.   Exercise Vital Sign    Days of Exercise per Week: 0 days    Minutes of Exercise per Session: Not on file  Stress: No Stress Concern Present (06/10/2022)   [facility] visits prior to 12/06/2022., [facility] visits prior to 12/06/2022.   [employer] of Occupational Health - Occupational Stress  Questionnaire    Feeling of Stress : Not at all  Social Connections: Unknown (06/10/2022)   [facility] visits prior to 12/06/2022., [facility] visits prior to 12/06/2022.   Social Advertising account executive [NHANES]    Frequency of Communication with Friends and Family: Twice a week    Frequency of Social Gatherings with Friends and Family: Once a week    Attends Religious Services: Patient declined    Active Member of Clubs or Organizations: No    Attends Banker Meetings: Never    Marital Status: Married  Catering manager Violence: Not At Risk (06/10/2022)   [facility] visits prior to 12/06/2022., [facility] visits prior to 12/06/2022.   Humiliation, Afraid, Rape, and Kick questionnaire    Fear of Current or Ex-Partner: No    Emotionally Abused: No    Physically Abused: No    Sexually Abused: No    Past Surgical History:  Procedure Laterality Date   ANKLE SURGERY     ATRIAL FIBRILLATION ABLATION N/A 10/02/2020   Procedure: ATRIAL FIBRILLATION ABLATION;  Surgeon: Regan Lemming, MD;  Location: [facility] INVASIVE CV [facility];  Service: Cardiovascular;  Laterality: N/A;   CERVICAL CONE BIOPSY     CESAREAN SECTION     COLONOSCOPY     HERNIA REPAIR     KNEE SURGERY     x5   ORIF ANKLE FRACTURE Left 06/02/2018   Procedure: OPEN REDUCTION INTERNAL FIXATION (ORIF) ANKLE FRACTURE;  Surgeon: Toni Arthurs, MD;  Location: [facility] OR[facility];  Service: Orthopedics;  Laterality: Left;   ORIF ANKLE FRACTURE Left 08/26/2018   Procedure: Left ankle syndesmosis OPEN REDUCTION INTERNAL FIXATION (ORIF)/ reconstruction of deltoid ligament;  Surgeon: Toni Arthurs, MD;  Location: [facility];  Service: Orthopedics;  Laterality: Left;   PACEMAKER IMPLANT N/A 08/05/2018   Procedure: PACEMAKER IMPLANT;  Surgeon: Regan Lemming, MD;  Location: [facility]  INVASIVE CV LAB;  Service: Cardiovascular;  Laterality: N/A;   POLYPECTOMY     SPINAL CORD STIMULATOR INSERTION N/A 05/07/2016   Procedure: LUMBAR SPINAL CORD STIMULATOR INSERTION;  Surgeon: Venita Lick, MD;  Location: [facility] OR;  Service: Orthopedics;  Laterality: N/A;      Current Outpatient Medications:    ADVAIR DISKUS 250-50 MCG/ACT AEPB, Inhale 1 puff into the lungs 2 (two) times daily., Disp: , Rfl:    albuterol (PROVENTIL HFA;VENTOLIN HFA) 108 (90 Base) MCG/ACT inhaler, Inhale 2 puffs into the lungs every 6 (six) hours as needed for wheezing or shortness of breath. , Disp: , Rfl:    apixaban (ELIQUIS) 5 MG TABS tablet, Take  1 tablet (5 mg total) by mouth 2 (two) times daily., Disp: 180 tablet, Rfl: 1   B Complex Vitamins (B COMPLEX PO), Take 1 tablet by mouth daily., Disp: , Rfl:    BREO ELLIPTA 200-25 MCG/INH AEPB, Inhale 1 puff into the lungs daily., Disp: , Rfl: 11   Coenzyme Q10 (CO Q 10) 100 MG CAPS, Take 100 mg by mouth at bedtime., Disp: , Rfl:    famotidine (PEPCID) 20 MG tablet, Take 20 mg by mouth 2 (two) times daily. New Zantac 360T, Disp: , Rfl: 0   fluticasone (FLONASE) 50 MCG/ACT nasal spray, Place 2 sprays into both nostrils as needed., Disp: , Rfl:    folic acid (FOLVITE) 1 MG tablet, Take 1 mg by mouth daily. , Disp: , Rfl:    HYDROmorphone (DILAUDID) 4 MG tablet, Take 4 mg by mouth 5 (five) times daily as needed for severe pain., Disp: , Rfl:    levothyroxine (SYNTHROID) 25 MCG tablet, Take 25 mcg by mouth daily before breakfast. , Disp: , Rfl:    losartan-hydrochlorothiazide (HYZAAR) 50-12.5 MG tablet, Take 1 tablet by mouth daily., Disp: 90 tablet, Rfl: 3   magnesium gluconate (MAGONATE) 500 MG tablet, Take 500 mg by mouth at bedtime., Disp: , Rfl:    metaxalone (SKELAXIN) 800 MG tablet, Take 800 mg by mouth 3 (three) times daily., Disp: , Rfl:    metoprolol succinate (TOPROL-XL) 100 MG 24 hr tablet, Take 1 tablet (100 mg total) by mouth daily. Take with or immediately  following a meal. Take with 50 mg tab to total 150 mg daily., Disp: 90 tablet, Rfl: 3   metoprolol succinate (TOPROL-XL) 50 MG 24 hr tablet, TAKE 1 TABLET DAILY WITH OR IMMEDIATELY FOLLOWING A MEAL. TAKE WITH 100 MG TAB TO TOTAL 150 MG DAILY, Disp: 90 tablet, Rfl: 3   naloxone (NARCAN) nasal spray 4 mg/0.1 mL, Place 1 spray into the nose as directed., Disp: , Rfl:    Olopatadine HCl (PATADAY) 0.7 % SOLN, Place 1 drop into both eyes daily., Disp: , Rfl:    potassium chloride (KLOR-CON) 10 MEQ tablet, Take 2 tablets (20 mEq total) by mouth daily., Disp: 60 tablet, Rfl: 3   pregabalin (LYRICA) 150 MG capsule, Take 150 mg by mouth 2 (two) times daily. Additional 150 mg in the middle of the day if needed, Disp: , Rfl:    PROCTOFOAM HC rectal foam, Place 1 applicator rectally 3 (three) times daily., Disp: , Rfl:    RESTASIS MULTIDOSE 0.05 % ophthalmic emulsion, Place 1 drop into both eyes every evening., Disp: , Rfl:    Rosuvastatin Calcium 20 MG CPSP, Take 20 mg by mouth daily., Disp: , Rfl:    senna (SENOKOT) 8.6 MG TABS tablet, Take 2 tablets by mouth at bedtime., Disp: , Rfl:    spironolactone (ALDACTONE) 25 MG tablet, Take 1 tablet (25 mg total) by mouth daily., Disp: 90 tablet, Rfl: 3   tiZANidine (ZANAFLEX) 4 MG tablet, Take 8 mg by mouth at bedtime., Disp: , Rfl:    Vitamin D, Ergocalciferol, (DRISDOL) 50000 units CAPS capsule, Take 50,000 Units by mouth every Wednesday. , Disp: , Rfl:     Physical Exam: Blood pressure 118/78, pulse 65, resp. rate 16, height 5\' 9"  (1.753 m), weight 255 lb 9.6 oz (115.9 kg), SpO2 90%.   Affect appropriate Healthy:  appears stated age HEENT: normal Neck supple with no adenopathy JVP normal no bruits no thyromegaly Lungs clear with no wheezing and good diaphragmatic motion  Heart:  S1/S2 no murmur, no rub, gallop or click PMI normal Abdomen: benighn, BS positve, no tenderness, no AAA no bruit.  No HSM or HJR Distal pulses intact with no bruits No  edema Neuro non-focal Skin warm and dry No muscular weakness   Labs:   Lab Results  Component Value Date   WBC 6.5 11/13/2022   HGB 16.1 (H) 11/13/2022   HCT 46.8 (H) 11/13/2022   MCV 99 (H) 11/13/2022   PLT 158 11/13/2022   No results for input(s): "NA", "K", "CL", "CO2", "BUN", "CREATININE", "CALCIUM", "PROT", "BILITOT", "ALKPHOS", "ALT", "AST", "GLUCOSE" in the last 168 hours.  Invalid input(s): "LABALBU" Lab Results  Component Value Date   TROPONINI <0.03 06/01/2018    Lab Results  Component Value Date   CHOL 119 06/02/2018   CHOL 169 03/26/2009   CHOL 169 04/04/2008   Lab Results  Component Value Date   HDL 39 (L) 06/02/2018   HDL 42.90 03/26/2009   HDL 16.1 (L) 04/04/2008   Lab Results  Component Value Date   LDLCALC 59 06/02/2018   LDLCALC 115 (H) 03/26/2009   LDLCALC 128 (H) 04/04/2008   Lab Results  Component Value Date   TRIG 104 06/02/2018   TRIG 54.0 03/26/2009   TRIG 126 04/04/2008   Lab Results  Component Value Date   CHOLHDL 3.1 06/02/2018   CHOLHDL 4 03/26/2009   CHOLHDL 10.5 CALC 04/04/2008   No results found for: "LDLDIRECT"    Radiology: CUP PACEART REMOTE DEVICE CHECK  Result Date: 08/13/2023 Scheduled remote reviewed. Normal device function.  3 AMS EGM's, 4-8sec in duration Next remote 91 days. LA, CVRS   EKG: SR LAD LBBB QRS 168 msec   ASSESSMENT AND PLAN:   DCM:  EF has been mildly reduced and stable with long standing LBBB. Continue current meds functional class one Discussed changing ARB to [facility] but she prefers and I concur to stay on current meds unless EF worsens PAF:  maintaining NSR post ablation continue DOAC and beta blocker f/u [place] SSS:  normal PPM function PaceArt 05/11/23 reviewed f/u EP Thyroid:  continue synthroid replacement TSH with primary was normal 06/13/22 2.74 Smoking:  counseled on cessation lung cancer CT ordered   Lung cancer CT  F/U in a year with general cardiology   Signed: Charlton Haws 08/14/2023, 9:17 AM

## 2023-08-13 LAB — CUP PACEART REMOTE DEVICE CHECK
Battery Remaining Longevity: 72 mo
Battery Remaining Percentage: 56 %
Battery Voltage: 2.99 V
Brady Statistic AP VP Percent: 1 %
Brady Statistic AP VS Percent: 82 %
Brady Statistic AS VP Percent: 1 %
Brady Statistic AS VS Percent: 18 %
Brady Statistic RA Percent Paced: 82 %
Brady Statistic RV Percent Paced: 1 %
Date Time Interrogation Session: 20241106154208
Implantable Lead Connection Status: 753985
Implantable Lead Connection Status: 753985
Implantable Lead Implant Date: 20191031
Implantable Lead Implant Date: 20191031
Implantable Lead Location: 753859
Implantable Lead Location: 753860
Implantable Pulse Generator Implant Date: 20191031
Lead Channel Impedance Value: 530 Ohm
Lead Channel Impedance Value: 600 Ohm
Lead Channel Pacing Threshold Amplitude: 0.5 V
Lead Channel Pacing Threshold Amplitude: 0.5 V
Lead Channel Pacing Threshold Pulse Width: 0.5 ms
Lead Channel Pacing Threshold Pulse Width: 0.5 ms
Lead Channel Sensing Intrinsic Amplitude: 12 mV
Lead Channel Sensing Intrinsic Amplitude: 4.4 mV
Lead Channel Setting Pacing Amplitude: 0.75 V
Lead Channel Setting Pacing Amplitude: 1.5 V
Lead Channel Setting Pacing Pulse Width: 0.5 ms
Lead Channel Setting Sensing Sensitivity: 2 mV
Pulse Gen Model: 2272
Pulse Gen Serial Number: 9079034

## 2023-08-14 ENCOUNTER — Ambulatory Visit: Payer: BC Managed Care – PPO | Attending: Cardiovascular Disease | Admitting: Cardiovascular Disease

## 2023-08-14 VITALS — BP 118/78 | HR 65 | Resp 16 | Ht 69.0 in | Wt 255.6 lb

## 2023-08-14 DIAGNOSIS — I420 Dilated cardiomyopathy: Secondary | ICD-10-CM | POA: Diagnosis not present

## 2023-08-14 DIAGNOSIS — Z87891 Personal history of nicotine dependence: Secondary | ICD-10-CM | POA: Diagnosis not present

## 2023-08-14 DIAGNOSIS — I5022 Chronic systolic (congestive) heart failure: Secondary | ICD-10-CM | POA: Diagnosis not present

## 2023-08-14 NOTE — Patient Instructions (Signed)
Medication Instructions:  Your physician recommends that you continue on your current medications as directed. Please refer to the Current Medication list given to you today.  *If you need a refill on your cardiac medications before your next appointment, please call your pharmacy*  Lab Work: If you have labs (blood work) drawn today and your tests are completely normal, you will receive your results only by: [place] (if you have MyChart) OR A paper copy in the mail If you have any lab test that is abnormal or we need to change your treatment, we will call you to review the results.  Testing/Procedures: Non-Cardiac CT scanning for lung cancer screening, (CAT scanning), is a noninvasive, special x-ray that produces cross-sectional images of the body using x-rays and a computer. CT scans help physicians diagnose and treat medical conditions. For some CT exams, a contrast material is used to enhance visibility in the area of the body being studied. CT scans provide greater clarity and reveal more details than regular x-ray exams.  Follow-Up: At [facility], you and your health needs are our priority.  As part of our continuing mission to provide you with exceptional heart care, we have created designated Provider Care Teams.  These Care Teams include your primary Cardiologist (physician) and Advanced Practice Providers (APPs -  Physician Assistants and Nurse Practitioners) who all work together to provide you with the care you need, when you need it.  We recommend signing up for the patient portal called "MyChart".  Sign up information is provided on this After Visit Summary.  MyChart is used to connect with patients for Virtual Visits (Telemedicine).  Patients are able to view lab/test results, encounter notes, upcoming appointments, etc.  Non-urgent messages can be sent to your provider as well.   To learn more about what you can do with MyChart, go to [email or web address].     Your next appointment:   1 year(s)  Provider:   Jenkins Rouge, MD

## 2023-08-17 ENCOUNTER — Ambulatory Visit (HOSPITAL_BASED_OUTPATIENT_CLINIC_OR_DEPARTMENT_OTHER)
Admission: RE | Admit: 2023-08-17 | Discharge: 2023-08-17 | Disposition: A | Discharge status: Home / Self Care | Payer: BC Managed Care – PPO | Source: Ambulatory Visit | Attending: Cardiovascular Disease | Admitting: Cardiovascular Disease

## 2023-08-17 ENCOUNTER — Encounter (HOSPITAL_BASED_OUTPATIENT_CLINIC_OR_DEPARTMENT_OTHER): Payer: Self-pay | Admitting: Physical Therapy

## 2023-08-17 DIAGNOSIS — Z87891 Personal history of nicotine dependence: Secondary | ICD-10-CM | POA: Insufficient documentation

## 2023-08-18 ENCOUNTER — Ambulatory Visit (HOSPITAL_BASED_OUTPATIENT_CLINIC_OR_DEPARTMENT_OTHER): Payer: BC Managed Care – PPO | Attending: Family Medicine | Admitting: Physical Therapy

## 2023-08-18 DIAGNOSIS — M6281 Muscle weakness (generalized): Secondary | ICD-10-CM | POA: Insufficient documentation

## 2023-08-18 DIAGNOSIS — R2681 Unsteadiness on feet: Secondary | ICD-10-CM | POA: Insufficient documentation

## 2023-08-18 DIAGNOSIS — M5459 Other low back pain: Secondary | ICD-10-CM | POA: Diagnosis present

## 2023-08-18 DIAGNOSIS — R2689 Other abnormalities of gait and mobility: Secondary | ICD-10-CM | POA: Diagnosis present

## 2023-08-18 NOTE — Therapy (Signed)
OUTPATIENT PHYSICAL THERAPY THORACOLUMBAR TREATMENT   Patient Name: [name] MRN: [id number] DOB:[birth year], 62 y.o., female Today's Date: 08/18/2023  END OF SESSION:         Past Medical History:  Diagnosis Date   Alcohol abuse    a. quit several years ago.   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   pacemaker insertion for tachy/brady syndrome   Spinal cord stimulator status    Spinal stenosis    Tobacco abuse    Past Surgical History:  Procedure Laterality Date   ANKLE SURGERY     ATRIAL FIBRILLATION ABLATION N/A 10/02/2020   Procedure: ATRIAL FIBRILLATION ABLATION;  Surgeon: Regan Lemming, MD;  Location: [facility];  Service: Cardiovascular;  Laterality: N/A;   CERVICAL CONE BIOPSY     CESAREAN SECTION     COLONOSCOPY     HERNIA REPAIR     KNEE SURGERY     x5   ORIF ANKLE FRACTURE Left 06/02/2018   Procedure: OPEN REDUCTION INTERNAL FIXATION (ORIF) ANKLE FRACTURE;  Surgeon: Toni Arthurs, MD;  Location: [facility];  Service: Orthopedics;  Laterality: Left;   ORIF ANKLE FRACTURE Left 08/26/2018   Procedure: Left ankle syndesmosis OPEN REDUCTION INTERNAL FIXATION (ORIF)/ reconstruction of deltoid ligament;  Surgeon: Toni Arthurs, MD;  Location: [facility];  Service: Orthopedics;  Laterality: Left;   PACEMAKER IMPLANT N/A 08/05/2018   Procedure: PACEMAKER IMPLANT;  Surgeon: Regan Lemming, MD;  Location: [facility];  Service: Cardiovascular;   Laterality: N/A;   POLYPECTOMY     SPINAL CORD STIMULATOR INSERTION N/A 05/07/2016   Procedure: LUMBAR SPINAL CORD STIMULATOR INSERTION;  Surgeon: Venita Lick, MD;  Location: [facility];  Service: Orthopedics;  Laterality: N/A;   Patient Active Problem List   Diagnosis Date Noted   Tachy-brady syndrome (HCC) 08/05/2018   Closed fracture of left ankle 06/01/2018   Paroxysmal atrial fibrillation (HCC) 06/01/2018   Benign essential hypertension 06/01/2018   Atrial flutter (HCC) 06/01/2018   Near syncope 06/01/2018   Bradycardia    Posterior tibial tendon dysfunction (PTTD) of right lower extremity 03/15/2018   Primary localized osteoarthrosis of ankle and foot 01/21/2018   Long-term current use of opiate analgesic 11/10/2017   Therapeutic opioid induced constipation 10/27/2017   On long term drug therapy 10/14/2017   Chronic low back pain 10/14/2017   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   Chronic pain syndrome 10/14/2017   Pain in left foot 10/12/2017   Pain in right foot 10/12/2017   B12 deficiency 04/21/2016   Cobalamin deficiency 04/21/2016   Thrombocytopenia (HCC) 11/28/2015   Chronic pain 11/28/2015   Disease of jaw 11/28/2015   Hypokalemia 11/28/2015   Impaired fasting glucose 11/28/2015   Swelling of both lower extremities 11/28/2015   Tobacco abuse 11/28/2015   Vertigo 11/28/2015   Hereditary and idiopathic neuropathy 11/28/2015  Hereditary and idiopathic peripheral neuropathy 09/17/2009   Disorder of peripheral nervous system 09/17/2009   Alcoholic cirrhosis of liver (HCC) 10/02/2008   Jaundice, non-neonatal 10/02/2008   Generalized abdominal pain 10/02/2008   Ascites 10/02/2008   Jaundice 10/02/2008   Esophageal varices (HCC) 04/04/2008   Esophageal varices (HCC) 04/04/2008   Alcohol abuse 03/16/2008   TRANSAMINASES, SERUM, ELEVATED 03/16/2008   Elevated levels of transaminase & lactic acid dehydrogenase 03/16/2008   SCARLET FEVER  03/14/2008   Hyperlipidemia 03/14/2008   External hemorrhoids 03/14/2008   Umbilical hernia 03/14/2008   Endometriosis 03/14/2008   DYSMENORRHEA 03/14/2008   INSOMNIA UNSPECIFIED 03/14/2008   Large liver 03/14/2008   Splenomegaly 03/14/2008   LIVER FUNCTION TESTS, ABNORMAL, HX OF 03/14/2008   History of disease of skin and subcutaneous tissue 03/14/2008   Insomnia 03/14/2008   Allergic rhinitis 05/31/2007   Gastroesophageal reflux disease 05/31/2007   Osteoarthritis 05/31/2007    PCP: Richmond Campbell., PA-C   REFERRING PROVIDER: Pincus Badder, FNP   REFERRING DIAG: M54.50 (ICD-10-CM) - Low back pain, unspecified   Rationale for Evaluation and Treatment: Rehabilitation  THERAPY DIAG:  No diagnosis found.  ONSET DATE: 7 YRS  SUBJECTIVE:                                                                                                                                                                                           SUBJECTIVE STATEMENT: Pt is having the ablation on Thursday.  Pt reports having knee pain worse on L after prior Rx.  Pt had cervical pain [place].  Pt saw her massage therapist and it helped some.    states the pool was awesome.  Pt states she felt amazing in the pool.  Pt states her L ankle was bothering her due to the weather change.  Pt is having a lumbar ablation on November 14th.    From evaluation:  8/15 steroid injection/ Lumbar facet arthropathy.  Will be having an ablation in Oct.  Left ankle replacement, bilat knee OA.  My feet and knees turn out.  Toes in left foot no sensation. Use cane to stop from falling. L4 ruptured disc 7 yrs ago wanted off of opioids so went with the stimulator.  Was unable to get off of opioids due to severity of pain. Back pain is high and constant.  Can't walk far. Being seen at [facility]   PERTINENT HISTORY:  Left ankle pain, S/P ankle replacement surgery At [facility] January 2023 Right wrist  injury S/P pacemaker implant 2019 ,atrial flutter/AFib? Chronic low back pain, spinal cord stimulator 2017 Tobacco dependence UDS 03/21/2019,  02/22/20, 07/22/21, 08/11/22 Eliquis SOAPP score 5 Oswestry score 20%, 42% (04/07/2022) Pain agreement 08/11/22;02/02/23 Narcan RX back and left buttock pain; CT myelogram L3-4 spinal stenosis loss of spouse-January 2024  PAIN:  Are you having pain? Yes: NPRS scale:  6/10 current, 8/10 worst, 5/10 best Pain location:  lumbar and bilat knees Pain description: constant Aggravating factors: Amb > 25 yds makes my eyes tear Relieving factors: opioids, lying down  PRECAUTIONS: Other: spinal cord stimulation  RED FLAGS: None   WEIGHT BEARING RESTRICTIONS: No  FALLS:  Has patient fallen in last 6 months? No  LIVING ENVIRONMENT: Lives with: lives with their daughter Lives in: House/apartment Stairs: Yes: Internal: 15 steps; can reach both Has following equipment at home: Single point cane, Wheelchair (manual), Shower bench, Grab bars, and lift chair  OCCUPATION: disabled  PLOF: Requires assistive device for independence  PATIENT GOALS: decrease muscle tightness in LB m;  NEXT MD VISIT: sept  OBJECTIVE:   DIAGNOSTIC FINDINGS:  01/02/23 CT Lumbar Spine IMPRESSION: 1. Progressive, advanced right-sided disc degeneration at L3-4 with a prominent right-sided disc osteophyte complex resulting in moderate spinal stenosis, moderate right lateral recess stenosis, and severe right neural foraminal stenosis. 2. Progressive, severe multilevel facet arthrosis. Grade 1 anterolisthesis develops with standing at L4-5. 3. New mild spinal stenosis and mild right neural foraminal stenosis at L4-5. 4. New mild left neural foraminal stenosis at L5-S1. 5.  Aortic Atherosclerosis (ICD10-I70.0)  Left ankle 06/01/23 Impression:  1. Stable appearing left total ankle arthroplasty. 2. Stable midfoot fusion. 3. Diffuse demineralization. 4. Hindfoot valgus.      TODAY'S TREATMENT:                                                                                                                               LUMBAR ROM:    AROM eval 08/18/23 (Pt used cane in 1 hand for support)  Flexion FT to mid shin WFL  Extension 25%P!   Right lateral flexion 25%P! 75% with pain  Left lateral flexion 50%P! WFL with pain  Right rotation     Left rotation      (Blank rows = not tested)  35.8, 33.2 31.0, 40.4   PT educated pt in correct performance and contraction of TrA.  Pt performed supine TrA contraction with and without 5 sec holds.   Tra with marching 12-15 reps Supine heel slides with TrA   PPT 2x10 Seated Clamshells  with RTB 2 x 10 Supine manual HS stretch 2x20-30 sec  Pt received a HEP handout and was educated in correct form and appropriate frequency.   PATIENT EDUCATION:  Education details:  POC, HEP, exercise form, and exercise rationale Person educated: Patient Education method: Explanation, demonstration, verbal cues Education comprehension: verbalized understanding, returned demonstration, verbal cues required  HOME EXERCISE PROGRAM: Access Code: [email or web address] URL: [email or web address] Date: 07/22/2023 Prepared by: Geni Bers  This aquatic home exercise program from Med[employer] utilizes pictures from land based exercises, but  has been adapted prior to lamination and issuance.   Exercises - Noodle press  - 1 x daily - 1-3 x weekly - 1-3 sets - 10 reps - Drawing Bow  - 1 x daily - 1-3 x weekly - 1-3 sets - 10 reps - Walking  - Seated Straddle on [employer] Breast Stroke Arms and Bicycle Legs  - Noodle Stomp  - 1 x daily - 71-3 x weekly - 1-3 sets - 10 reps - Warrior I in [employer] with [employer]  - 1 x daily - 1-3 x weekly - 1-3 sets - 10 reps - Side to Side Hamstring Stretch with Noodle at [employer]  - 1 x daily - 71-3 x weekly - 1 sets - 3 reps - 10 hold  Updated HEP: Access Code: [email or web address] URL:  [email or web address] Date: 08/03/2023 Prepared by: Aaron Edelman  Exercises - Supine Transversus Abdominis Bracing - Hands on Stomach  - 2 x daily - 7 x weekly - 2 sets - 10 reps - 5 seconds hold - Supine March  - 1 x daily - 7 x weekly - 2 sets - 10 reps - Supine Posterior Pelvic Tilt  - 2 x daily - 7 x weekly - 2 sets - 10 reps  ASSESSMENT:  CLINICAL IMPRESSION: PT established land based HEP today.  She received a HEP handout and demonstrates good understanding.  PT did not include heel slides due to knee discomfort.  Pt performed exercises well with instruction in correct form.  She felt more comfortable with seated clamshells than supine clamshells.  Pt performed TUG today and she had no significant change though was .6 seconds faster today.  Pt responded well to Rx having no c/o's after Rx.  She should benefit from cont skilled PT services to address impairments and goals and to improve core strength and function.   Pt demonstrates improved lumbar flexion and Sb'ing AROM.     OBJECTIVE IMPAIRMENTS: Abnormal gait, decreased activity tolerance, decreased balance, decreased coordination, decreased endurance, decreased mobility, difficulty walking, decreased ROM, decreased strength, impaired flexibility, impaired sensation, postural dysfunction, and pain.   ACTIVITY LIMITATIONS: carrying, lifting, bending, sitting, standing, squatting, stairs, transfers, locomotion level, and caring for others  PARTICIPATION LIMITATIONS: meal prep, cleaning, laundry, driving, shopping, community activity, occupation, and yard work  PERSONAL FACTORS: Fitness, Past/current experiences, Time since onset of injury/illness/exacerbation, and 3+ comorbidities: see impression statement  are also affecting patient's functional outcome.   REHAB POTENTIAL: Good  CLINICAL DECISION MAKING: Unstable/unpredictable  EVALUATION COMPLEXITY: High   GOALS: Goals reviewed with patient? Yes  SHORT TERM  GOALS: Target date: 07/18/23  Pt will tolerate full aquatic sessions consistently without increase in pain and with improving function to demonstrate good toleration and effectiveness of intervention.  Baseline: Goal status: MET -06/23/23  2.  Pt will report decrease in usual pain by up to 2 NPRS for improved toleration to activity/quality of life and to demonstrate improved management of pain. Baseline: 8/10 Goal status:Met 07/08/23  3.  Pt will tolerate walking to or from setting along with tolerating full aquatic session without significant increase in pain or fatigue Baseline:  Goal status: Not Met 08/18/23  4.  Pt will improve on Tug test to <or= 14s to demonstrate improvement in lower extremity function, mobility and decreased fall risk. Baseline: 17.62 / 17.07 on 10/28 Goal status: NOT MET    5.  Pt will be able to ambulate submerged ue support as needed up to 10 consecutive minutes to  demonstrate improving toleration to amb. Baseline:  Goal status: Met 07/01/23    LONG TERM GOALS: Target date: 08/11/23  Pt to meet stated Foto Goal of 50% Baseline: 38% Goal status: INITIAL  2.  Pt will be indep with final HEP's (land and aquatic as appropriate) for continued management of condition  Baseline: none Goal status: (Aquatic MET).  Progressing  111/12/24  3.  Pt will improve strength in all areas listed by  up to 10lbs to demonstrate improved overall physical function Baseline: see chart Goal status: INITIAL  4.  Pt will report decrease in worst pain to </= 6/10 for improved toleration to activity/quality of life and to demonstrate improved management of pain.  Baseline: 10/10, 8/10 worst   08/18/23 Goal status:  progressing   08/18/23  5.  Pt will be able to amb to and from setting (400 - 500 ft each way) using AD as needed to demonstrate improved gait and endurance/aerobic capacity. Baseline: 75 ft Goal status: NOT MET  6.  Pt to improve lumbar ROM by 25% for improved  function Baseline: see chart Goal status: INITIAL  PLAN:  PT FREQUENCY: 1-2x/wk  PT DURATION: 10 weeks  PLANNED INTERVENTIONS: Therapeutic exercises, Therapeutic activity, Neuromuscular re-education, Balance training, Gait training, Patient/Family education, Self Care, Joint mobilization, Joint manipulation, Stair training, Orthotic/Fit training, DME instructions, Aquatic Therapy, Dry Needling, Electrical stimulation, Spinal mobilization, Cryotherapy, Moist heat, Taping, Traction, Ionotophoresis 4mg /ml Dexamethasone, Manual therapy, and Re-evaluation.  PLAN FOR NEXT SESSION: Land: progress gait training, Le and core strengthening, HEP.   Audie Clear III PT, DPT 08/18/23 4:30 PM

## 2023-08-19 ENCOUNTER — Encounter (HOSPITAL_BASED_OUTPATIENT_CLINIC_OR_DEPARTMENT_OTHER): Payer: Self-pay | Admitting: Physical Therapy

## 2023-08-28 NOTE — Progress Notes (Unsigned)
  Electrophysiology Office Note:   ID:  BANDI [name], DOB Nov 19, 1960, MRN [id number]  Primary Cardiologist: Charlton Haws, MD Electrophysiologist: Will Jorja Loa, MD     History of Present Illness:   [name] is a 62 y.o. female with h/o PAF/AFL s/p ablation, HFmrEF, LBBB, and HTN seen today for routine electrophysiology followup.   Since last being seen in our clinic the patient reports doing well overall.  she denies chest pain, palpitations, dyspnea, PND, orthopnea, nausea, vomiting, dizziness, syncope, edema, weight gain, or early satiety.   Review of systems complete and found to be negative unless listed in HPI.   EP Information / Studies Reviewed:    EKG is ordered today. Personal review as below.  EKG Interpretation Date/Time:  Monday August 31 2023 11:29:00 EST Ventricular Rate:  66 PR Interval:  232 QRS Duration:  144 QT Interval:  464 QTC Calculation: 486 R Axis:   -48  Text Interpretation: Atrial-paced rhythm with prolonged AV conduction Left axis deviation Left bundle branch block Confirmed by [name] [phone]) on 08/31/2023 11:32:00 AM    PPM Interrogation-  reviewed in detail today,  See PACEART report.  Device History: Abbott Dual Chamber PPM implanted 07/2018 for Tachy-Brady syndrome  Echo 04/2023 Echo 45-50%, grade 1 DD, normal RV  Arrhythmia History  S/p PVI and CTI ablation 09/2020  Physical Exam:   VS:  BP 124/78 (BP Location: Left Arm, Patient Position: Sitting, Cuff Size: Normal)   Pulse 66   Ht 5\' 9"  (1.753 m)   Wt 253 lb (114.8 kg)   LMP  (LMP Unknown)   SpO2 98%   BMI 37.36 kg/m    Wt Readings from Last 3 Encounters:  08/31/23 253 lb (114.8 kg)  08/14/23 255 lb 9.6 oz (115.9 kg)  11/13/22 254 lb 6.4 oz (115.4 kg)     GEN: Well nourished, well developed in no acute distress NECK: No JVD; No carotid bruits CARDIAC: Regular rate and rhythm, no murmurs, rubs, gallops RESPIRATORY:  Clear to auscultation without rales,  wheezing or rhonchi  ABDOMEN: Soft, non-tender, non-distended EXTREMITIES:  No edema; No deformity   ASSESSMENT AND PLAN:    Tachy-Brady syndrome s/p Abbott PPM  Normal PPM function See Pace Art report No changes today  Paroxysmal AF Atrial flutter S/p ablation 09/2020 Continue eliquis 5 mg BID for CHA2DS2VASc of at least 2 Continue toprol   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  HTN Stable on current regimen   Disposition:   Follow up with Dr. Elberta Fortis in 9 months  Signed, Graciella Freer, PA-C

## 2023-08-31 ENCOUNTER — Ambulatory Visit: Payer: BC Managed Care – PPO | Attending: Student | Admitting: Student

## 2023-08-31 ENCOUNTER — Encounter: Payer: Self-pay | Admitting: Student

## 2023-08-31 VITALS — BP 124/78 | HR 66 | Ht 69.0 in | Wt 253.0 lb

## 2023-08-31 DIAGNOSIS — I4819 Other persistent atrial fibrillation: Secondary | ICD-10-CM

## 2023-08-31 DIAGNOSIS — I5022 Chronic systolic (congestive) heart failure: Secondary | ICD-10-CM

## 2023-08-31 DIAGNOSIS — I495 Sick sinus syndrome: Secondary | ICD-10-CM | POA: Diagnosis not present

## 2023-08-31 DIAGNOSIS — I483 Typical atrial flutter: Secondary | ICD-10-CM | POA: Diagnosis not present

## 2023-08-31 LAB — CUP PACEART INCLINIC DEVICE CHECK
Battery Remaining Longevity: 72 mo
Battery Voltage: 2.99 V
Brady Statistic RA Percent Paced: 82 %
Brady Statistic RV Percent Paced: 0.45 %
Date Time Interrogation Session: 20241125121237
Implantable Lead Connection Status: 753985
Implantable Lead Connection Status: 753985
Implantable Lead Implant Date: 20191031
Implantable Lead Implant Date: 20191031
Implantable Lead Location: 753859
Implantable Lead Location: 753860
Implantable Pulse Generator Implant Date: 20191031
Lead Channel Impedance Value: 575 Ohm
Lead Channel Impedance Value: 662.5 Ohm
Lead Channel Pacing Threshold Amplitude: 0.375 V
Lead Channel Pacing Threshold Amplitude: 0.625 V
Lead Channel Pacing Threshold Pulse Width: 0.5 ms
Lead Channel Pacing Threshold Pulse Width: 0.5 ms
Lead Channel Sensing Intrinsic Amplitude: 12 mV
Lead Channel Sensing Intrinsic Amplitude: 4.8 mV
Lead Channel Setting Pacing Amplitude: 0.625
Lead Channel Setting Pacing Amplitude: 1.625
Lead Channel Setting Pacing Pulse Width: 0.5 ms
Lead Channel Setting Sensing Sensitivity: 2 mV
Pulse Gen Model: 2272
Pulse Gen Serial Number: 9079034

## 2023-08-31 NOTE — Progress Notes (Signed)
Remote pacemaker transmission.   

## 2023-08-31 NOTE — Patient Instructions (Signed)
 Medication Instructions:  Your physician recommends that you continue on your current medications as directed. Please refer to the Current Medication list given to you today.  *If you need a refill on your cardiac medications before your next appointment, please call your pharmacy*  Lab Work: None ordered If you have labs (blood work) drawn today and your tests are completely normal, you will receive your results only by: MyChart Message (if you have MyChart) OR A paper copy in the mail If you have any lab test that is abnormal or we need to change your treatment, we will call you to review the results.  Follow-Up: At [facility], you and your health needs are our priority.  As part of our continuing mission to provide you with exceptional heart care, we have created designated Provider Care Teams.  These Care Teams include your primary Cardiologist (physician) and Advanced Practice Providers (APPs -  Physician Assistants and Nurse Practitioners) who all work together to provide you with the care you need, when you need it.  Your next appointment:   9 month(s)  Provider:   Loman Brooklyn, MD

## 2023-09-02 LAB — BASIC METABOLIC PANEL
BUN/Creatinine Ratio: 20 (ref 12–28)
BUN: 22 mg/dL (ref 8–27)
CO2: 21 mmol/L (ref 20–29)
Calcium: 10.1 mg/dL (ref 8.7–10.3)
Chloride: 100 mmol/L (ref 96–106)
Creatinine, Ser: 1.12 mg/dL — ABNORMAL HIGH (ref 0.57–1.00)
Glucose: 173 mg/dL — ABNORMAL HIGH (ref 70–99)
Potassium: 5.2 mmol/L (ref 3.5–5.2)
Sodium: 139 mmol/L (ref 134–144)
eGFR: 56 mL/min/{1.73_m2} — ABNORMAL LOW (ref >59)

## 2023-09-02 LAB — PRO B NATRIURETIC PEPTIDE: NT-Pro BNP: 457 pg/mL — ABNORMAL HIGH (ref 0–287)

## 2023-09-08 ENCOUNTER — Ambulatory Visit (HOSPITAL_BASED_OUTPATIENT_CLINIC_OR_DEPARTMENT_OTHER): Payer: BC Managed Care – PPO | Attending: Family Medicine | Admitting: Physical Therapy

## 2023-09-08 DIAGNOSIS — M5459 Other low back pain: Secondary | ICD-10-CM | POA: Insufficient documentation

## 2023-09-08 DIAGNOSIS — M6281 Muscle weakness (generalized): Secondary | ICD-10-CM | POA: Insufficient documentation

## 2023-09-08 DIAGNOSIS — R2689 Other abnormalities of gait and mobility: Secondary | ICD-10-CM | POA: Diagnosis present

## 2023-09-08 DIAGNOSIS — R2681 Unsteadiness on feet: Secondary | ICD-10-CM | POA: Insufficient documentation

## 2023-09-08 NOTE — Therapy (Signed)
OUTPATIENT PHYSICAL THERAPY THORACOLUMBAR TREATMENT / Progress note   Patient Name: [name] MRN: [id number] DOB:06-10-1961, 62 y.o., female Today's Date: 09/08/2023  END OF SESSION:  PT End of Session - 09/08/23 1626     Visit Number 17    PT Start Time 1625                   Past Medical History:  Diagnosis Date   Alcohol abuse    a. quit several years ago.   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   pacemaker insertion for tachy/brady syndrome   Spinal cord stimulator status    Spinal stenosis    Tobacco abuse    Past Surgical History:  Procedure Laterality Date   ANKLE SURGERY     ATRIAL FIBRILLATION ABLATION N/A 10/02/2020   Procedure: ATRIAL FIBRILLATION ABLATION;  Surgeon: Regan Lemming, MD;  Location: [facility];  Service: Cardiovascular;  Laterality: N/A;   CERVICAL CONE BIOPSY     CESAREAN SECTION     COLONOSCOPY     HERNIA REPAIR     KNEE SURGERY     x5   ORIF ANKLE FRACTURE Left 06/02/2018   Procedure: OPEN REDUCTION INTERNAL FIXATION (ORIF) ANKLE FRACTURE;  Surgeon: Toni Arthurs, MD;  Location: [facility];  Service: Orthopedics;  Laterality: Left;   ORIF ANKLE FRACTURE Left 08/26/2018   Procedure: Left ankle syndesmosis OPEN REDUCTION INTERNAL FIXATION (ORIF)/ reconstruction of deltoid ligament;  Surgeon: Toni Arthurs, MD;  Location: [facility];  Service: Orthopedics;  Laterality: Left;   PACEMAKER IMPLANT N/A 08/05/2018   Procedure: PACEMAKER  IMPLANT;  Surgeon: Regan Lemming, MD;  Location: [facility];  Service: Cardiovascular;  Laterality: N/A;   POLYPECTOMY     SPINAL CORD STIMULATOR INSERTION N/A 05/07/2016   Procedure: LUMBAR SPINAL CORD STIMULATOR INSERTION;  Surgeon: Venita Lick, MD;  Location: [facility];  Service: Orthopedics;  Laterality: N/A;   Patient Active Problem List   Diagnosis Date Noted   Tachy-brady syndrome (HCC) 08/05/2018   Closed fracture of left ankle 06/01/2018   Paroxysmal atrial fibrillation (HCC) 06/01/2018   Benign essential hypertension 06/01/2018   Atrial flutter (HCC) 06/01/2018   Near syncope 06/01/2018   Bradycardia    Posterior tibial tendon dysfunction (PTTD) of right lower extremity 03/15/2018   Primary localized osteoarthrosis of ankle and foot 01/21/2018   Long-term current use of opiate analgesic 11/10/2017   Therapeutic opioid induced constipation 10/27/2017   On long term drug therapy 10/14/2017   Chronic low back pain 10/14/2017   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   Chronic pain syndrome 10/14/2017   Pain in left foot 10/12/2017   Pain in right foot 10/12/2017   B12 deficiency 04/21/2016   Cobalamin deficiency 04/21/2016   Thrombocytopenia (HCC) 11/28/2015   Chronic pain 11/28/2015   Disease of jaw  11/28/2015   Hypokalemia 11/28/2015   Impaired fasting glucose 11/28/2015   Swelling of both lower extremities 11/28/2015   Tobacco abuse 11/28/2015   Vertigo 11/28/2015   Hereditary and idiopathic neuropathy 11/28/2015   Hereditary and idiopathic peripheral neuropathy 09/17/2009   Disorder of peripheral nervous system 09/17/2009   Alcoholic cirrhosis of liver (HCC) 10/02/2008   Jaundice, non-neonatal 10/02/2008   Generalized abdominal pain 10/02/2008   Ascites 10/02/2008   Jaundice 10/02/2008   Esophageal varices (HCC) 04/04/2008   Esophageal varices (HCC) 04/04/2008   Alcohol abuse 03/16/2008   TRANSAMINASES, SERUM, ELEVATED  03/16/2008   Elevated levels of transaminase & lactic acid dehydrogenase 03/16/2008   SCARLET FEVER 03/14/2008   Hyperlipidemia 03/14/2008   External hemorrhoids 03/14/2008   Umbilical hernia 03/14/2008   Endometriosis 03/14/2008   DYSMENORRHEA 03/14/2008   INSOMNIA UNSPECIFIED 03/14/2008   Large liver 03/14/2008   Splenomegaly 03/14/2008   LIVER FUNCTION TESTS, ABNORMAL, HX OF 03/14/2008   History of disease of skin and subcutaneous tissue 03/14/2008   Insomnia 03/14/2008   Allergic rhinitis 05/31/2007   Gastroesophageal reflux disease 05/31/2007   Osteoarthritis 05/31/2007    PCP: Richmond Campbell., PA-C   REFERRING PROVIDER: Pincus Badder, FNP   REFERRING DIAG: M54.50 (ICD-10-CM) - Low back pain, unspecified   Rationale for Evaluation and Treatment: Rehabilitation  THERAPY DIAG:  No diagnosis found.  ONSET DATE: 7 YRS  SUBJECTIVE:                                                                                                                                                                                           SUBJECTIVE STATEMENT: Pt had the lumbar ablation on 11/21.  Pt states she felt better until Thanksgiving.  Her pain returned around Thanksgiving.  Her pain increased and she had significant pain which decreased her mobility yesterday.  Pt has been lying in her recliner.  She states she is having numbness in bilat feet.  She typically has numbness in L foot though is worse now.  She returns to MD on Saturday due to her pain.  Pt reports she was performing HEP prior to ablation.  She hadn't performed HEP since the lumbar ablation and also due to Thanksgiving.  Pt denies any adverse effects after prior Rx.  Pt states she had a swollen lymph node and was prescribed prednisone.      PERTINENT HISTORY:  Left ankle pain, S/P ankle replacement surgery At [facility] January 2023 Right wrist injury S/P pacemaker implant 2019 ,atrial flutter/AFib? Chronic low back pain,  spinal cord stimulator 2017 Tobacco dependence UDS 03/21/2019, 02/22/20, 07/22/21, 08/11/22 Eliquis SOAPP score 5  Oswestry score 20%, 42% (04/07/2022) Pain agreement 08/11/22;02/02/23 Narcan RX back and left buttock pain; CT myelogram L3-4 spinal stenosis loss of spouse-January 2024  PAIN:  Are you having pain? Yes: NPRS scale:  7/10 current, 8/10 worst, 5/10 best Pain location:  central lumbar Pain description: constant Aggravating factors: Amb > 25 yds makes my eyes tear Relieving factors: opioids, lying down  PRECAUTIONS: Other: spinal cord stimulation  RED FLAGS: None   WEIGHT BEARING RESTRICTIONS: No  FALLS:  Has patient fallen in last 6 months? No  LIVING ENVIRONMENT: Lives with: lives with their daughter Lives in: House/apartment Stairs: Yes: Internal: 15 steps; can reach both Has following equipment at home: Single point cane, Wheelchair (manual), Shower bench, Grab bars, and lift chair  OCCUPATION: disabled  PLOF: Requires assistive device for independence  PATIENT GOALS: decrease muscle tightness in LB m;  NEXT MD VISIT: sept  OBJECTIVE:   DIAGNOSTIC FINDINGS:  01/02/23 CT Lumbar Spine IMPRESSION: 1. Progressive, advanced right-sided disc degeneration at L3-4 with a prominent right-sided disc osteophyte complex resulting in moderate spinal stenosis, moderate right lateral recess stenosis, and severe right neural foraminal stenosis. 2. Progressive, severe multilevel facet arthrosis. Grade 1 anterolisthesis develops with standing at L4-5. 3. New mild spinal stenosis and mild right neural foraminal stenosis at L4-5. 4. New mild left neural foraminal stenosis at L5-S1. 5.  Aortic Atherosclerosis (ICD10-I70.0)  Left ankle 06/01/23 Impression:  1. Stable appearing left total ankle arthroplasty. 2. Stable midfoot fusion. 3. Diffuse demineralization. 4. Hindfoot valgus.     TODAY'S TREATMENT:                                                                                                                                LUMBAR ROM:    AROM eval 08/18/23 (Pt used cane in 1 hand for support)  Flexion FT to mid shin WFL  Extension 25%P!   Right lateral flexion 25%P! 75% with pain  Left lateral flexion 50%P! WFL with pain  Right rotation     Left rotation      (Blank rows = not tested)  LE MMT:        R   /    L Hip flexion:  35.8 /  33.2 Hip abduction:  31.0, 40.4  FOTO:  Prior/Current:  38/44.  Goal of 50.   Manual Therapy:  Pt received STM to bilat lumbar paraspinals in prone to improve soft tissue tightness and mobility and reduce pain.   Therapeutic Exercise:  Reviewed pt presentation, pain level, response to prior Rx, and HEP compliance.   Tra with marching 2x10 reps Supine heel slides with TrA   PPT 2x10 Supine  Seated Clamshells with RTB 2 x 10   PATIENT EDUCATION:  Education details:  POC, HEP, exercise form, goal progress, objective findings, and exercise rationale Person educated: Patient Education method: Explanation, demonstration, verbal cues Education comprehension: verbalized understanding, returned demonstration, verbal cues required  HOME EXERCISE  PROGRAM: Access Code: [email or web address] URL: [email or web address] Date: 07/22/2023 Prepared by: Geni Bers  This aquatic home exercise program from [employer] utilizes pictures from land based exercises, but has been adapted prior to lamination and issuance.   Exercises - Noodle press  - 1 x daily - 1-3 x weekly - 1-3 sets - 10 reps - Drawing Bow  - 1 x daily - 1-3 x weekly - 1-3 sets - 10 reps - Walking  - Seated Straddle on [employer] Breast Stroke Arms and Bicycle Legs  - Noodle Stomp  - 1 x daily - 71-3 x weekly - 1-3 sets - 10 reps - Warrior I in [employer] with [employer]  - 1 x daily - 1-3 x weekly - 1-3 sets - 10 reps - Side to Side Hamstring Stretch with Noodle at [employer]  - 1 x daily - 71-3 x weekly - 1 sets - 3 reps - 10  hold  Updated HEP: Access Code: [email or web address] URL: [email or web address] Date: 08/03/2023 Prepared by: Aaron Edelman  Exercises - Supine Transversus Abdominis Bracing - Hands on Stomach  - 2 x daily - 7 x weekly - 2 sets - 10 reps - 5 seconds hold - Supine March  - 1 x daily - 7 x weekly - 2 sets - 10 reps - Supine Posterior Pelvic Tilt  - 2 x daily - 7 x weekly - 2 sets - 10 reps  ASSESSMENT:  CLINICAL IMPRESSION: Pt has completed aquatic therapy and presents to PT for continued land based treatment.  Pt enjoyed the pool.  PT assessed lumbar ROM and hip strength.  She demonstrates improved lumbar flexion and Sb'ing AROM.  Pt demonstrates worse hip flexion strength bilat though improved hip abd strength bilat.  Pt demonstrates improved self perceived disability with FOTO score improving from 38 to 44.  She had no significant change in TUG time last visit.  PT established land based HEP last visit and PT reviewed HEP today.  Pt performed exercises well with cuing for correct form.  Pt has met STG's #1,2,5 and LTG #6.  Pt should benefit from cont skilled PT services progressing to land therapy to address impairments and goals and to improve core strength and function.   Pt states she felt better, not as tight after STM    OBJECTIVE IMPAIRMENTS: Abnormal gait, decreased activity tolerance, decreased balance, decreased coordination, decreased endurance, decreased mobility, difficulty walking, decreased ROM, decreased strength, impaired flexibility, impaired sensation, postural dysfunction, and pain.   ACTIVITY LIMITATIONS: carrying, lifting, bending, sitting, standing, squatting, stairs, transfers, locomotion level, and caring for others  PARTICIPATION LIMITATIONS: meal prep, cleaning, laundry, driving, shopping, community activity, occupation, and yard work  PERSONAL FACTORS: Fitness, Past/current experiences, Time since onset of injury/illness/exacerbation, and 3+ comorbidities:  see impression statement  are also affecting patient's functional outcome.   REHAB POTENTIAL: Good  CLINICAL DECISION MAKING: Unstable/unpredictable  EVALUATION COMPLEXITY: High   GOALS: Goals reviewed with patient? Yes  SHORT TERM GOALS: Target date: 07/18/23  Pt will tolerate full aquatic sessions consistently without increase in pain and with improving function to demonstrate good toleration and effectiveness of intervention.  Baseline: Goal status: MET -06/23/23  2.  Pt will report decrease in usual pain by up to 2 NPRS for improved toleration to activity/quality of life and to demonstrate improved management of pain. Baseline: 8/10 Goal status:Met 07/08/23  3.  Pt will tolerate walking to or from setting along with tolerating full aquatic session without significant increase in pain or  fatigue Baseline:  Goal status: Not Met 08/18/23  4.  Pt will improve on Tug test to <or= 14s to demonstrate improvement in lower extremity function, mobility and decreased fall risk. Baseline: 17.62 / 17.07 on 10/28 Goal status: NOT MET    5.  Pt will be able to ambulate submerged ue support as needed up to 10 consecutive minutes to demonstrate improving toleration to amb. Baseline:  Goal status: Met 07/01/23    LONG TERM GOALS: Target date: 09/29/23  Pt to meet stated Foto Goal of 50% Baseline: 38% Goal status: PROGRESSING   11/12  2.  Pt will be indep with final HEP's (land and aquatic as appropriate) for continued management of condition  Baseline: none Goal status: (Aquatic MET).  Progressing  08/18/23  3.  Pt will improve strength in all areas listed by  up to 10lbs to demonstrate improved overall physical function Baseline: see chart Goal status: ONGOING  4.  Pt will report decrease in worst pain to </= 6/10 for improved toleration to activity/quality of life and to demonstrate improved management of pain.  Baseline: 10/10, 8/10 worst   08/18/23 Goal status:  progressing    08/18/23  5.  Pt will be able to amb to and from setting (400 - 500 ft each way) using AD as needed to demonstrate improved gait and endurance/aerobic capacity. Baseline: 75 ft Goal status: NOT MET  6.  Pt to improve lumbar ROM by 25% for improved function Baseline: see chart Goal status: GOAL MET  11/12  PLAN:  PT FREQUENCY: 1x/wk  PT DURATION:  6 weeks    PLANNED INTERVENTIONS: Therapeutic exercises, Therapeutic activity, Neuromuscular re-education, Balance training, Gait training, Patient/Family education, Self Care, Joint mobilization, Joint manipulation, Stair training, Orthotic/Fit training, DME instructions, Aquatic Therapy, Dry Needling, Electrical stimulation, Spinal mobilization, Cryotherapy, Moist heat, Taping, Traction, Ionotophoresis 4mg /ml Dexamethasone, Manual therapy, and Re-evaluation.  PLAN FOR NEXT SESSION: Land: progress gait training, Le and core strengthening, HEP.   Audie Clear III PT, DPT 09/08/23 4:27 PM

## 2023-09-09 ENCOUNTER — Encounter (HOSPITAL_BASED_OUTPATIENT_CLINIC_OR_DEPARTMENT_OTHER): Payer: Self-pay | Admitting: Physical Therapy

## 2023-09-14 ENCOUNTER — Ambulatory Visit (HOSPITAL_BASED_OUTPATIENT_CLINIC_OR_DEPARTMENT_OTHER): Payer: BC Managed Care – PPO | Admitting: Physical Therapy

## 2023-09-14 DIAGNOSIS — R2689 Other abnormalities of gait and mobility: Secondary | ICD-10-CM

## 2023-09-14 DIAGNOSIS — M5459 Other low back pain: Secondary | ICD-10-CM | POA: Diagnosis not present

## 2023-09-14 DIAGNOSIS — R2681 Unsteadiness on feet: Secondary | ICD-10-CM

## 2023-09-14 DIAGNOSIS — M6281 Muscle weakness (generalized): Secondary | ICD-10-CM

## 2023-09-14 NOTE — Therapy (Signed)
OUTPATIENT PHYSICAL THERAPY THORACOLUMBAR TREATMENT    Patient Name: [name] MRN: [id number] DOB:[birth year], 62 y.o., female Today's Date: 09/14/2023  END OF SESSION:  PT End of Session - 09/14/23 1029     Visit Number 18    PT Start Time 1024                   Past Medical History:  Diagnosis Date   Alcohol abuse    a. quit several years ago.   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   pacemaker insertion for tachy/brady syndrome   Spinal cord stimulator status    Spinal stenosis    Tobacco abuse    Past Surgical History:  Procedure Laterality Date   ANKLE SURGERY     ATRIAL FIBRILLATION ABLATION N/A 10/02/2020   Procedure: ATRIAL FIBRILLATION ABLATION;  Surgeon: Regan Lemming, MD;  Location: [facility];  Service: Cardiovascular;  Laterality: N/A;   CERVICAL CONE BIOPSY     CESAREAN SECTION     COLONOSCOPY     HERNIA REPAIR     KNEE SURGERY     x5   ORIF ANKLE FRACTURE Left 06/02/2018   Procedure: OPEN REDUCTION INTERNAL FIXATION (ORIF) ANKLE FRACTURE;  Surgeon: Toni Arthurs, MD;  Location: [facility];  Service: Orthopedics;  Laterality: Left;   ORIF ANKLE FRACTURE Left 08/26/2018   Procedure: Left ankle syndesmosis OPEN REDUCTION INTERNAL FIXATION (ORIF)/ reconstruction of deltoid ligament;  Surgeon: Toni Arthurs, MD;  Location: [facility];  Service: Orthopedics;  Laterality: Left;   PACEMAKER IMPLANT N/A 08/05/2018   Procedure: PACEMAKER IMPLANT;   Surgeon: Regan Lemming, MD;  Location: [facility];  Service: Cardiovascular;  Laterality: N/A;   POLYPECTOMY     SPINAL CORD STIMULATOR INSERTION N/A 05/07/2016   Procedure: LUMBAR SPINAL CORD STIMULATOR INSERTION;  Surgeon: Venita Lick, MD;  Location: [facility];  Service: Orthopedics;  Laterality: N/A;   Patient Active Problem List   Diagnosis Date Noted   Tachy-brady syndrome (HCC) 08/05/2018   Closed fracture of left ankle 06/01/2018   Paroxysmal atrial fibrillation (HCC) 06/01/2018   Benign essential hypertension 06/01/2018   Atrial flutter (HCC) 06/01/2018   Near syncope 06/01/2018   Bradycardia    Posterior tibial tendon dysfunction (PTTD) of right lower extremity 03/15/2018   Primary localized osteoarthrosis of ankle and foot 01/21/2018   Long-term current use of opiate analgesic 11/10/2017   Therapeutic opioid induced constipation 10/27/2017   On long term drug therapy 10/14/2017   Chronic low back pain 10/14/2017   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   Chronic pain syndrome 10/14/2017   Pain in left foot 10/12/2017   Pain in right foot 10/12/2017   B12 deficiency 04/21/2016   Cobalamin deficiency 04/21/2016   Thrombocytopenia (HCC) 11/28/2015   Chronic pain 11/28/2015   Disease of jaw 11/28/2015  Hypokalemia 11/28/2015   Impaired fasting glucose 11/28/2015   Swelling of both lower extremities 11/28/2015   Tobacco abuse 11/28/2015   Vertigo 11/28/2015   Hereditary and idiopathic neuropathy 11/28/2015   Hereditary and idiopathic peripheral neuropathy 09/17/2009   Disorder of peripheral nervous system 09/17/2009   Alcoholic cirrhosis of liver (HCC) 10/02/2008   Jaundice, non-neonatal 10/02/2008   Generalized abdominal pain 10/02/2008   Ascites 10/02/2008   Jaundice 10/02/2008   Esophageal varices (HCC) 04/04/2008   Esophageal varices (HCC) 04/04/2008   Alcohol abuse 03/16/2008   TRANSAMINASES, SERUM, ELEVATED 03/16/2008    Elevated levels of transaminase & lactic acid dehydrogenase 03/16/2008   SCARLET FEVER 03/14/2008   Hyperlipidemia 03/14/2008   External hemorrhoids 03/14/2008   Umbilical hernia 03/14/2008   Endometriosis 03/14/2008   DYSMENORRHEA 03/14/2008   INSOMNIA UNSPECIFIED 03/14/2008   Large liver 03/14/2008   Splenomegaly 03/14/2008   LIVER FUNCTION TESTS, ABNORMAL, HX OF 03/14/2008   History of disease of skin and subcutaneous tissue 03/14/2008   Insomnia 03/14/2008   Allergic rhinitis 05/31/2007   Gastroesophageal reflux disease 05/31/2007   Osteoarthritis 05/31/2007    PCP: Richmond Campbell., PA-C   REFERRING PROVIDER: Pincus Badder, FNP   REFERRING DIAG: M54.50 (ICD-10-CM) - Low back pain, unspecified   Rationale for Evaluation and Treatment: Rehabilitation  THERAPY DIAG:  No diagnosis found.  ONSET DATE: 7 YRS  SUBJECTIVE:                                                                                                                                                                                           SUBJECTIVE STATEMENT: Pt saw MD on Saturday.  She states MD thinks the lumbar ablation did work.  He informed her that the erector spinae mm are very tight and recommended deep tissue massage.  Pt is having numbness in bilat feet and informed MD who didn't think it was from her back.  Pt reports increased R shoulder and L hip pain after prior Rx.  Pt states she sits in her W/C at home and her shoulder is in an impingement range when she is cutting food.  Pt has completed her prednisone.  Pt is performing her HEP.  Pt had some pain in R hip with HEP including with PPT and seated clams.          PERTINENT HISTORY:  Left ankle pain, S/P ankle replacement surgery [facility] January 2023 Right wrist injury S/P pacemaker implant 2019 ,atrial flutter/AFib? Chronic low back pain, spinal cord stimulator 2017 Tobacco dependence UDS 03/21/2019, 02/22/20, 07/22/21,  08/11/22 Eliquis SOAPP score 5 Oswestry score 20%, 42% (04/07/2022) Pain  agreement 08/11/22;02/02/23 Narcan RX back and left buttock pain; CT myelogram L3-4 spinal stenosis loss of spouse-January 2024  PAIN:  Are you having pain? Yes: NPRS scale:  6/10 current, 8/10 worst, 5/10 best Pain location:  central lumbar Pain description: constant Aggravating factors: Amb > 25 yds makes my eyes tear Relieving factors: opioids, lying down  PRECAUTIONS: Other: spinal cord stimulation  RED FLAGS: None   WEIGHT BEARING RESTRICTIONS: No  FALLS:  Has patient fallen in last 6 months? No  LIVING ENVIRONMENT: Lives with: lives with their daughter Lives in: House/apartment Stairs: Yes: Internal: 15 steps; can reach both Has following equipment at home: Single point cane, Wheelchair (manual), Shower bench, Grab bars, and lift chair  OCCUPATION: disabled  PLOF: Requires assistive device for independence  PATIENT GOALS: decrease muscle tightness in LB m;  NEXT MD VISIT: sept  OBJECTIVE:   DIAGNOSTIC FINDINGS:  01/02/23 CT Lumbar Spine IMPRESSION: 1. Progressive, advanced right-sided disc degeneration at L3-4 with a prominent right-sided disc osteophyte complex resulting in moderate spinal stenosis, moderate right lateral recess stenosis, and severe right neural foraminal stenosis. 2. Progressive, severe multilevel facet arthrosis. Grade 1 anterolisthesis develops with standing at L4-5. 3. New mild spinal stenosis and mild right neural foraminal stenosis at L4-5. 4. New mild left neural foraminal stenosis at L5-S1. 5.  Aortic Atherosclerosis (ICD10-I70.0)  Left ankle 06/01/23 Impression:  1. Stable appearing left total ankle arthroplasty. 2. Stable midfoot fusion. 3. Diffuse demineralization. 4. Hindfoot valgus.     TODAY'S TREATMENT:                                                                                                                                Manual Therapy:   Pt received STM to bilat lumbar paraspinals in prone to improve soft tissue tightness and mobility and reduce pain.   Therapeutic Exercise:  Reviewed pt presentation, pain level, response to prior Rx, and HEP compliance.   Tra with marching 2x10 reps Supine heel slides with TrA   PPT x10 Seated Clamshells with RTB 2 x 10 Attempted rows with RTB with TrA though stopped due to R shoulder pain  See below for pt education     PATIENT EDUCATION:  Education details:  PT educated pt concerning anatomy and function of the erector spinae muscles including using pictures on the internet for the anatomy.  PT gave her the aquatic handout from the aquatic therapist. HEP, exercise form, and exercise rationale Person educated: Patient Education method: Explanation, demonstration, verbal cues Education comprehension: verbalized understanding, returned demonstration, verbal cues required  HOME EXERCISE PROGRAM: Access Code: [email or web address] URL: [email or web address] Date: 07/22/2023 Prepared by: Geni Bers  This aquatic home exercise program from MedBridge utilizes pictures from land based exercises, but has been adapted prior to lamination and issuance.   Exercises - Noodle press  - 1 x daily - 1-3 x weekly - 1-3 sets - 10 reps - Drawing Bow  - 1 x  daily - 1-3 x weekly - 1-3 sets - 10 reps - Walking  - Seated Straddle on Flotation Forward Breast Stroke Arms and Bicycle Legs  - Noodle Stomp  - 1 x daily - 71-3 x weekly - 1-3 sets - 10 reps - Warrior I in [employer] with [employer]  - 1 x daily - 1-3 x weekly - 1-3 sets - 10 reps - Side to Side Hamstring Stretch with Noodle at [employer]  - 1 x daily - 71-3 x weekly - 1 sets - 3 reps - 10 hold  Updated HEP: Access Code: [email or web address] URL: [email or web address] Date: 08/03/2023 Prepared by: Aaron Edelman  Exercises - Supine Transversus Abdominis Bracing - Hands on Stomach  - 2 x daily - 7 x weekly - 2 sets - 10 reps  - 5 seconds hold - Supine March  - 1 x daily - 7 x weekly - 2 sets - 10 reps - Supine Posterior Pelvic Tilt  - 2 x daily - 7 x weekly - 2 sets - 10 reps  ASSESSMENT:  CLINICAL IMPRESSION: Pt felt better for a little while after lumbar ablation though her pain returned.  She also reports numbness in R foot and increased numbness in L foot.  PT performed STM to bilat lumbar paraspinals to improve soft tissue tightness/mobility and pain.  Pt states she felt better, felt more loose after STM.  Pt performed exercises well with cuing and instruction in correct form.  She responded well to Rx stating she felt more loose after Rx and had improved pain to 5-6/10.   Pt should benefit from cont skilled PT services to address impairments and goals and to improve core strength and function.   Felt loose after STM, a little sore Pt had no hip pain with PPT or seated clams today. Didn't perform shoulder flexion/ext in supine with TrA due to pt's response last Rx.    Poss 11:08      OBJECTIVE IMPAIRMENTS: Abnormal gait, decreased activity tolerance, decreased balance, decreased coordination, decreased endurance, decreased mobility, difficulty walking, decreased ROM, decreased strength, impaired flexibility, impaired sensation, postural dysfunction, and pain.   ACTIVITY LIMITATIONS: carrying, lifting, bending, sitting, standing, squatting, stairs, transfers, locomotion level, and caring for others  PARTICIPATION LIMITATIONS: meal prep, cleaning, laundry, driving, shopping, community activity, occupation, and yard work  PERSONAL FACTORS: Fitness, Past/current experiences, Time since onset of injury/illness/exacerbation, and 3+ comorbidities: see impression statement  are also affecting patient's functional outcome.   REHAB POTENTIAL: Good  CLINICAL DECISION MAKING: Unstable/unpredictable  EVALUATION COMPLEXITY: High   GOALS: Goals reviewed with patient? Yes  SHORT TERM GOALS: Target date:  07/18/23  Pt will tolerate full aquatic sessions consistently without increase in pain and with improving function to demonstrate good toleration and effectiveness of intervention.  Baseline: Goal status: MET -06/23/23  2.  Pt will report decrease in usual pain by up to 2 NPRS for improved toleration to activity/quality of life and to demonstrate improved management of pain. Baseline: 8/10 Goal status:Met 07/08/23  3.  Pt will tolerate walking to or from setting along with tolerating full aquatic session without significant increase in pain or fatigue Baseline:  Goal status: Not Met 08/18/23  4.  Pt will improve on Tug test to <or= 14s to demonstrate improvement in lower extremity function, mobility and decreased fall risk. Baseline: 17.62 / 17.07 on 10/28 Goal status: NOT MET    5.  Pt will be able to ambulate submerged ue support as needed up to  10 consecutive minutes to demonstrate improving toleration to amb. Baseline:  Goal status: Met 07/01/23    LONG TERM GOALS: Target date: 09/29/23  Pt to meet stated Foto Goal of 50% Baseline: 38% Goal status: PROGRESSING   11/12  2.  Pt will be indep with final HEP's (land and aquatic as appropriate) for continued management of condition  Baseline: none Goal status: (Aquatic MET).  Progressing  08/18/23  3.  Pt will improve strength in all areas listed by  up to 10lbs to demonstrate improved overall physical function Baseline: see chart Goal status: ONGOING  4.  Pt will report decrease in worst pain to </= 6/10 for improved toleration to activity/quality of life and to demonstrate improved management of pain.  Baseline: 10/10, 8/10 worst   08/18/23 Goal status:  progressing   08/18/23  5.  Pt will be able to amb to and from setting (400 - 500 ft each way) using AD as needed to demonstrate improved gait and endurance/aerobic capacity. Baseline: 75 ft Goal status: NOT MET  6.  Pt to improve lumbar ROM by 25% for improved  function Baseline: see chart Goal status: GOAL MET  11/12  PLAN:  PT FREQUENCY: 1x/wk  PT DURATION:  6 weeks    PLANNED INTERVENTIONS: Therapeutic exercises, Therapeutic activity, Neuromuscular re-education, Balance training, Gait training, Patient/Family education, Self Care, Joint mobilization, Joint manipulation, Stair training, Orthotic/Fit training, DME instructions, Aquatic Therapy, Dry Needling, Electrical stimulation, Spinal mobilization, Cryotherapy, Moist heat, Taping, Traction, Ionotophoresis 4mg /ml Dexamethasone, Manual therapy, and Re-evaluation.  PLAN FOR NEXT SESSION: Land: progress gait training, Le and core strengthening, HEP.  She is seeing MD on Saturday.   Audie Clear III PT, DPT 09/14/23 10:29 AM

## 2023-09-15 ENCOUNTER — Other Ambulatory Visit: Payer: Self-pay | Admitting: Cardiology

## 2023-09-15 ENCOUNTER — Other Ambulatory Visit (HOSPITAL_COMMUNITY): Payer: Self-pay

## 2023-09-15 ENCOUNTER — Telehealth: Payer: Self-pay | Admitting: Cardiovascular Disease

## 2023-09-15 ENCOUNTER — Encounter (HOSPITAL_BASED_OUTPATIENT_CLINIC_OR_DEPARTMENT_OTHER): Payer: Self-pay | Admitting: Physical Therapy

## 2023-09-15 DIAGNOSIS — I480 Paroxysmal atrial fibrillation: Secondary | ICD-10-CM

## 2023-09-15 MED ORDER — APIXABAN 5 MG PO TABS
5.0000 mg | ORAL_TABLET | Freq: Two times a day (BID) | ORAL | 1 refills | Status: DC
  Filled 2023-09-15: qty 180, 90d supply, fill #0

## 2023-09-15 NOTE — Telephone Encounter (Signed)
Prescription refill request for Eliquis received. Indication: PAF Last office visit: 08/31/23  Polly Cobia PA-C Scr: 1.12 on 08/31/23  Epic Age: 62 Weight: 114.8kg   Based on above findings Eliquis 5mg  twice daily is the appropriate dose.  Refill approved.

## 2023-09-15 NOTE — Telephone Encounter (Signed)
The patient has been notified of the result and verbalized understanding.  All questions (if any) were answered. Ethelda Chick, RN 09/15/2023 5:06 PM

## 2023-09-15 NOTE — Telephone Encounter (Signed)
*  STAT* If patient is at the pharmacy, call can be transferred to refill team.   1. Which medications need to be refilled? (please list name of each medication and dose if known) apixaban (ELIQUIS) 5 MG TABS tablet    2. Would you like to learn more about the convenience, safety, & potential cost savings by using the [facility]? No   3. Are you open to using the [facility] (Type [facility].) No   4. Which pharmacy/location (including street and city if local pharmacy) is medication to be sent to?[id number] - [place], [place] - [address]    5. Do they need a 30 day or 90 day supply? 90 day  Pt is out of medication

## 2023-09-15 NOTE — Telephone Encounter (Signed)
Pt returning nurses call from yesterday regarding results. Please advise 

## 2023-09-15 NOTE — Telephone Encounter (Signed)
Pt is requesting Eliquis

## 2023-09-16 ENCOUNTER — Other Ambulatory Visit (HOSPITAL_COMMUNITY): Payer: Self-pay

## 2023-09-16 ENCOUNTER — Other Ambulatory Visit: Payer: Self-pay | Admitting: Cardiology

## 2023-09-16 DIAGNOSIS — I480 Paroxysmal atrial fibrillation: Secondary | ICD-10-CM

## 2023-09-16 NOTE — Telephone Encounter (Signed)
Prescription refill request for Eliquis received. Indication: PAF Last office visit: 08/31/23  Polly Cobia PA-C Scr: 1.12 on 08/31/23  Epic Age: 62 Weight: 114.8kg   Based on above findings Eliquis 5mg  twice daily is the appropriate dose.  Refill approved.

## 2023-09-21 ENCOUNTER — Ambulatory Visit (HOSPITAL_BASED_OUTPATIENT_CLINIC_OR_DEPARTMENT_OTHER): Payer: BC Managed Care – PPO | Admitting: Physical Therapy

## 2023-09-21 DIAGNOSIS — R2681 Unsteadiness on feet: Secondary | ICD-10-CM

## 2023-09-21 DIAGNOSIS — M5459 Other low back pain: Secondary | ICD-10-CM | POA: Diagnosis not present

## 2023-09-21 DIAGNOSIS — M6281 Muscle weakness (generalized): Secondary | ICD-10-CM

## 2023-09-21 DIAGNOSIS — R2689 Other abnormalities of gait and mobility: Secondary | ICD-10-CM

## 2023-09-21 NOTE — Therapy (Signed)
OUTPATIENT PHYSICAL THERAPY THORACOLUMBAR TREATMENT    Patient Name: [name] MRN: [id number] DOB:January 29, 1961, 62 y.o., female Today's Date: 09/22/2023  END OF SESSION:  PT End of Session - 09/21/23 1106     Visit Number 19    Number of Visits 21    Date for PT Re-Evaluation 09/29/23    Authorization Type BCBS    PT Start Time 1021    PT Stop Time 1106    PT Time Calculation (min) 45 min    Activity Tolerance Patient tolerated treatment well    Behavior During Therapy WFL for tasks assessed/performed                    Past Medical History:  Diagnosis Date   Alcohol abuse    a. quit several years ago.   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   pacemaker insertion for tachy/brady syndrome   Spinal cord stimulator status    Spinal stenosis    Tobacco abuse    Past Surgical History:  Procedure Laterality Date   ANKLE SURGERY     ATRIAL FIBRILLATION ABLATION N/A 10/02/2020   Procedure: ATRIAL FIBRILLATION ABLATION;  Surgeon: Regan Lemming, MD;  Location: [facility];  Service: Cardiovascular;  Laterality: N/A;   CERVICAL CONE BIOPSY     CESAREAN SECTION     COLONOSCOPY     HERNIA REPAIR     KNEE SURGERY     x5   ORIF ANKLE FRACTURE Left 06/02/2018   Procedure: OPEN REDUCTION INTERNAL FIXATION (ORIF) ANKLE FRACTURE;  Surgeon: Toni Arthurs, MD;  Location: [facility];  Service: Orthopedics;  Laterality: Left;   ORIF ANKLE FRACTURE Left 08/26/2018   Procedure: Left ankle  syndesmosis OPEN REDUCTION INTERNAL FIXATION (ORIF)/ reconstruction of deltoid ligament;  Surgeon: Toni Arthurs, MD;  Location: [facility];  Service: Orthopedics;  Laterality: Left;   PACEMAKER IMPLANT N/A 08/05/2018   Procedure: PACEMAKER IMPLANT;  Surgeon: Regan Lemming, MD;  Location: [facility];  Service: Cardiovascular;  Laterality: N/A;   POLYPECTOMY     SPINAL CORD STIMULATOR INSERTION N/A 05/07/2016   Procedure: LUMBAR SPINAL CORD STIMULATOR INSERTION;  Surgeon: Venita Lick, MD;  Location: [facility];  Service: Orthopedics;  Laterality: N/A;   Patient Active Problem List   Diagnosis Date Noted   Tachy-brady syndrome (HCC) 08/05/2018   Closed fracture of left ankle 06/01/2018   Paroxysmal atrial fibrillation (HCC) 06/01/2018   Benign essential hypertension 06/01/2018   Atrial flutter (HCC) 06/01/2018   Near syncope 06/01/2018   Bradycardia    Posterior tibial tendon dysfunction (PTTD) of right lower extremity 03/15/2018   Primary localized osteoarthrosis of ankle and foot 01/21/2018   Long-term current use of opiate analgesic 11/10/2017   Therapeutic opioid induced constipation 10/27/2017   On long term drug therapy 10/14/2017   Chronic low back pain 10/14/2017  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   Chronic pain syndrome 10/14/2017   Pain in left foot 10/12/2017   Pain in right foot 10/12/2017   B12 deficiency 04/21/2016   Cobalamin deficiency 04/21/2016   Thrombocytopenia (HCC) 11/28/2015   Chronic pain 11/28/2015   Disease of jaw 11/28/2015   Hypokalemia 11/28/2015   Impaired fasting glucose 11/28/2015   Swelling of both lower extremities 11/28/2015   Tobacco abuse 11/28/2015   Vertigo 11/28/2015   Hereditary and idiopathic neuropathy 11/28/2015   Hereditary and idiopathic peripheral neuropathy 09/17/2009   Disorder of peripheral nervous system 09/17/2009   Alcoholic cirrhosis of liver (HCC) 10/02/2008   Jaundice,  non-neonatal 10/02/2008   Generalized abdominal pain 10/02/2008   Ascites 10/02/2008   Jaundice 10/02/2008   Esophageal varices (HCC) 04/04/2008   Esophageal varices (HCC) 04/04/2008   Alcohol abuse 03/16/2008   TRANSAMINASES, SERUM, ELEVATED 03/16/2008   Elevated levels of transaminase & lactic acid dehydrogenase 03/16/2008   SCARLET FEVER 03/14/2008   Hyperlipidemia 03/14/2008   External hemorrhoids 03/14/2008   Umbilical hernia 03/14/2008   Endometriosis 03/14/2008   DYSMENORRHEA 03/14/2008   INSOMNIA UNSPECIFIED 03/14/2008   Large liver 03/14/2008   Splenomegaly 03/14/2008   LIVER FUNCTION TESTS, ABNORMAL, HX OF 03/14/2008   History of disease of skin and subcutaneous tissue 03/14/2008   Insomnia 03/14/2008   Allergic rhinitis 05/31/2007   Gastroesophageal reflux disease 05/31/2007   Osteoarthritis 05/31/2007    PCP: Richmond Campbell., PA-C   REFERRING PROVIDER: Pincus Badder, FNP   REFERRING DIAG: M54.50 (ICD-10-CM) - Low back pain, unspecified   Rationale for Evaluation and Treatment: Rehabilitation  THERAPY DIAG:  Other low back pain  Muscle weakness (generalized)  Other abnormalities of gait and mobility  Unsteadiness on feet  ONSET DATE: 7 YRS  SUBJECTIVE:                                                                                                                                                                                           SUBJECTIVE STATEMENT: Pt states she felt good immediately after PT.  Pt reports having increased pain walking from the clinic to her car.  Pt states her shoulder still bothers her though is feeling better today.  Pt states her back is feeling a little better today also.  Pt finished prednisone this AM.  Pt states the rain always makes her hurt worse.  Pt reports she has been performing her HEP though didn't perform them this weekend.         PERTINENT HISTORY:  Left ankle pain, S/P ankle replacement surgery At  [facility] January 2023  Right wrist injury S/P pacemaker implant 2019 ,atrial flutter/AFib? Chronic low back pain, spinal cord stimulator 2017 Tobacco dependence UDS 03/21/2019, 02/22/20, 07/22/21, 08/11/22 Eliquis SOAPP score 5 Oswestry score 20%, 42% (04/07/2022) Pain agreement 08/11/22;02/02/23 Narcan RX back and left buttock pain; CT myelogram L3-4 spinal stenosis loss of spouse-January 2024  PAIN:  Are you having pain? Yes: NPRS scale:  5/10 current, 8/10 worst, 5/10 best Pain location:  bilat lumbar flanks Pain description: constant Aggravating factors: Amb > 25 yds makes my eyes tear Relieving factors: opioids, lying down  PRECAUTIONS: Other: spinal cord stimulation  RED FLAGS: None   WEIGHT BEARING RESTRICTIONS: No  FALLS:  Has patient fallen in last 6 months? No  LIVING ENVIRONMENT: Lives with: lives with their daughter Lives in: House/apartment Stairs: Yes: Internal: 15 steps; can reach both Has following equipment at home: Single point cane, Wheelchair (manual), Shower bench, Grab bars, and lift chair  OCCUPATION: disabled  PLOF: Requires assistive device for independence  PATIENT GOALS: decrease muscle tightness in LB m;  NEXT MD VISIT: sept  OBJECTIVE:   DIAGNOSTIC FINDINGS:  01/02/23 CT Lumbar Spine IMPRESSION: 1. Progressive, advanced right-sided disc degeneration at L3-4 with a prominent right-sided disc osteophyte complex resulting in moderate spinal stenosis, moderate right lateral recess stenosis, and severe right neural foraminal stenosis. 2. Progressive, severe multilevel facet arthrosis. Grade 1 anterolisthesis develops with standing at L4-5. 3. New mild spinal stenosis and mild right neural foraminal stenosis at L4-5. 4. New mild left neural foraminal stenosis at L5-S1. 5.  Aortic Atherosclerosis (ICD10-I70.0)  Left ankle 06/01/23 Impression:  1. Stable appearing left total ankle arthroplasty. 2. Stable midfoot fusion. 3. Diffuse  demineralization. 4. Hindfoot valgus.     TODAY'S TREATMENT:                                                                                                                                Manual Therapy:   Reviewed pt presentation, response to prior Rx, pain level, and HEP compliance.   PT demonstrated and educated pt in using the theracane.  Pt used the theracane seated with cuing.   Pt received STM to bilat lumbar paraspinals in prone to improve soft tissue tightness and mobility and reduce pain.     Therapeutic Exercise:  Tra with marching 2x10 reps Supine hip adduction isometric x 10 reps with TrA   Seated Clamshells with RTB 2 x 10 with TrA Standing marching with TrA  1x10, 1x5     PATIENT EDUCATION:  Education details:  POC, HEP, exercise form, and exercise rationale Person educated: Patient Education method: Explanation, demonstration, verbal cues Education comprehension: verbalized understanding, returned demonstration, verbal cues required  HOME EXERCISE PROGRAM: Access Code: [email or web address] URL: [email or web address] Date: 07/22/2023 Prepared by: Geni Bers  This aquatic home exercise program from Med[employer] utilizes pictures from land based exercises, but has been adapted prior to lamination and issuance.   Exercises - Noodle press  - 1 x  daily - 1-3 x weekly - 1-3 sets - 10 reps - Drawing Bow  - 1 x daily - 1-3 x weekly - 1-3 sets - 10 reps - Walking  - Seated Straddle on [employer] Breast Stroke Arms and Bicycle Legs  - Noodle Stomp  - 1 x daily - 71-3 x weekly - 1-3 sets - 10 reps - Warrior I in [employer] with [employer]  - 1 x daily - 1-3 x weekly - 1-3 sets - 10 reps - Side to Side Hamstring Stretch with Noodle at [employer]  - 1 x daily - 71-3 x weekly - 1 sets - 3 reps - 10 hold  Updated HEP: Access Code: [email or web address] URL: [email or web address] Date: 08/03/2023 Prepared by: Aaron Edelman  Exercises - Supine  Transversus Abdominis Bracing - Hands on Stomach  - 2 x daily - 7 x weekly - 2 sets - 10 reps - 5 seconds hold - Supine March  - 1 x daily - 7 x weekly - 2 sets - 10 reps - Supine Posterior Pelvic Tilt  - 2 x daily - 7 x weekly - 2 sets - 10 reps  ASSESSMENT:  CLINICAL IMPRESSION: Pt has soft tissue tightness in lumbar paraspinals.  PT educated pt in using the theracane to improve soft soft tissue mobility in lumbar.  Pt used the theracane in the clinic with instruction from PT and Pt liked the theracane.  Pt reports feeling better after STM.  Pt performed exercises well.  PT did not perform any core exercises that involved the UE due to shoulder pain.  She responded well to Rx reporting improved pain from 5/10 before Rx to 3-4/10 after Rx.  Pt should benefit from cont skilled PT services to address impairments and goals and to improve core strength and function.        OBJECTIVE IMPAIRMENTS: Abnormal gait, decreased activity tolerance, decreased balance, decreased coordination, decreased endurance, decreased mobility, difficulty walking, decreased ROM, decreased strength, impaired flexibility, impaired sensation, postural dysfunction, and pain.   ACTIVITY LIMITATIONS: carrying, lifting, bending, sitting, standing, squatting, stairs, transfers, locomotion level, and caring for others  PARTICIPATION LIMITATIONS: meal prep, cleaning, laundry, driving, shopping, community activity, occupation, and yard work  PERSONAL FACTORS: Fitness, Past/current experiences, Time since onset of injury/illness/exacerbation, and 3+ comorbidities: see impression statement  are also affecting patient's functional outcome.   REHAB POTENTIAL: Good  CLINICAL DECISION MAKING: Unstable/unpredictable  EVALUATION COMPLEXITY: High   GOALS: Goals reviewed with patient? Yes  SHORT TERM GOALS: Target date: 07/18/23  Pt will tolerate full aquatic sessions consistently without increase in pain and with improving  function to demonstrate good toleration and effectiveness of intervention.  Baseline: Goal status: MET -06/23/23  2.  Pt will report decrease in usual pain by up to 2 NPRS for improved toleration to activity/quality of life and to demonstrate improved management of pain. Baseline: 8/10 Goal status:Met 07/08/23  3.  Pt will tolerate walking to or from setting along with tolerating full aquatic session without significant increase in pain or fatigue Baseline:  Goal status: Not Met 08/18/23  4.  Pt will improve on Tug test to <or= 14s to demonstrate improvement in lower extremity function, mobility and decreased fall risk. Baseline: 17.62 / 17.07 on 10/28 Goal status: NOT MET    5.  Pt will be able to ambulate submerged ue support as needed up to 10 consecutive minutes to demonstrate improving toleration to amb. Baseline:  Goal status: Met 07/01/23    LONG  TERM GOALS: Target date: 09/29/23  Pt to meet stated Foto Goal of 50% Baseline: 38% Goal status: PROGRESSING   11/12  2.  Pt will be indep with final HEP's (land and aquatic as appropriate) for continued management of condition  Baseline: none Goal status: (Aquatic MET).  Progressing  08/18/23  3.  Pt will improve strength in all areas listed by  up to 10lbs to demonstrate improved overall physical function Baseline: see chart Goal status: ONGOING  4.  Pt will report decrease in worst pain to </= 6/10 for improved toleration to activity/quality of life and to demonstrate improved management of pain.  Baseline: 10/10, 8/10 worst   08/18/23 Goal status:  progressing   08/18/23  5.  Pt will be able to amb to and from setting (400 - 500 ft each way) using AD as needed to demonstrate improved gait and endurance/aerobic capacity. Baseline: 75 ft Goal status: NOT MET  6.  Pt to improve lumbar ROM by 25% for improved function Baseline: see chart Goal status: GOAL MET  11/12  PLAN:  PT FREQUENCY: 1x/wk  PT DURATION:  6 weeks     PLANNED INTERVENTIONS: Therapeutic exercises, Therapeutic activity, Neuromuscular re-education, Balance training, Gait training, Patient/Family education, Self Care, Joint mobilization, Joint manipulation, Stair training, Orthotic/Fit training, DME instructions, Aquatic Therapy, Dry Needling, Electrical stimulation, Spinal mobilization, Cryotherapy, Moist heat, Taping, Traction, Ionotophoresis 4mg /ml Dexamethasone, Manual therapy, and Re-evaluation.  PLAN FOR NEXT SESSION: Land: progress gait training, Le and core strengthening, HEP.     Audie Clear III PT, DPT 09/22/23 10:40 AM

## 2023-09-22 ENCOUNTER — Encounter (HOSPITAL_BASED_OUTPATIENT_CLINIC_OR_DEPARTMENT_OTHER): Payer: Self-pay | Admitting: Physical Therapy

## 2023-09-28 ENCOUNTER — Ambulatory Visit (HOSPITAL_BASED_OUTPATIENT_CLINIC_OR_DEPARTMENT_OTHER): Payer: BC Managed Care – PPO | Admitting: Physical Therapy

## 2023-09-28 DIAGNOSIS — M5459 Other low back pain: Secondary | ICD-10-CM | POA: Diagnosis not present

## 2023-09-28 DIAGNOSIS — R2681 Unsteadiness on feet: Secondary | ICD-10-CM

## 2023-09-28 DIAGNOSIS — M6281 Muscle weakness (generalized): Secondary | ICD-10-CM

## 2023-09-28 DIAGNOSIS — R2689 Other abnormalities of gait and mobility: Secondary | ICD-10-CM

## 2023-09-28 NOTE — Therapy (Addendum)
 OUTPATIENT PHYSICAL THERAPY THORACOLUMBAR TREATMENT    Patient Name: [name] MRN: [id number] DOB:01/03/1961, 62 y.o., female Today's Date: 09/29/2023  END OF SESSION:  PT End of Session - 09/28/23 1425     Visit Number 20    Number of Visits 21    Date for PT Re-Evaluation 09/29/23    Authorization Type BCBS    PT Start Time 1422    PT Stop Time 1452    PT Time Calculation (min) 30 min    Activity Tolerance Patient tolerated treatment well    Behavior During Therapy WFL for tasks assessed/performed                    Past Medical History:  Diagnosis Date   Alcohol abuse    a. quit several years ago.   Allergy    Anemia    younger   Arthritis    feet,knees   B12 deficiency    BPPV (benign paroxysmal positional vertigo)    Cirrhosis (HCC)    Complication of anesthesia    slow to wake   Constipation    on movantik - stools regular and soft on this   COPD (chronic obstructive pulmonary disease) (HCC)    Foot drop    GERD (gastroesophageal reflux disease)    Hyperlipidemia    Hypothyroidism    Neuromuscular disorder (HCC)    idiopathic neuropathy   Presence of permanent cardiac pacemaker 08/05/2018   pacemaker insertion for tachy/brady syndrome   Spinal cord stimulator status    Spinal stenosis    Tobacco abuse    Past Surgical History:  Procedure Laterality Date   ANKLE SURGERY     ATRIAL FIBRILLATION ABLATION N/A 10/02/2020   Procedure: ATRIAL FIBRILLATION ABLATION;  Surgeon: Inocencio Soyla Lunger, MD;  Location: [facility];  Service: Cardiovascular;  Laterality: N/A;   CERVICAL CONE BIOPSY     CESAREAN SECTION     COLONOSCOPY     HERNIA REPAIR     KNEE SURGERY     x5   ORIF ANKLE FRACTURE Left 06/02/2018   Procedure: OPEN REDUCTION INTERNAL FIXATION (ORIF) ANKLE FRACTURE;  Surgeon: Kit Rush, MD;  Location: [facility];  Service: Orthopedics;  Laterality: Left;   ORIF ANKLE FRACTURE Left 08/26/2018   Procedure: Left ankle  syndesmosis OPEN REDUCTION INTERNAL FIXATION (ORIF)/ reconstruction of deltoid ligament;  Surgeon: Kit Rush, MD;  Location: [facility];  Service: Orthopedics;  Laterality: Left;   PACEMAKER IMPLANT N/A 08/05/2018   Procedure: PACEMAKER IMPLANT;  Surgeon: Inocencio Soyla Lunger, MD;  Location: [facility];  Service: Cardiovascular;  Laterality: N/A;   POLYPECTOMY     SPINAL CORD STIMULATOR INSERTION N/A 05/07/2016   Procedure: LUMBAR SPINAL CORD STIMULATOR INSERTION;  Surgeon: Donaciano Sprang, MD;  Location: [facility];  Service: Orthopedics;  Laterality: N/A;   Patient Active Problem List   Diagnosis Date Noted   Tachy-brady syndrome (HCC) 08/05/2018   Closed fracture of left ankle 06/01/2018   Paroxysmal atrial fibrillation (HCC) 06/01/2018   Benign essential hypertension 06/01/2018   Atrial flutter (HCC) 06/01/2018   Near syncope 06/01/2018   Bradycardia    Posterior tibial tendon dysfunction (PTTD) of right lower extremity 03/15/2018   Primary localized osteoarthrosis of ankle and foot 01/21/2018   Long-term current use of opiate analgesic 11/10/2017   Therapeutic opioid induced constipation 10/27/2017   On long term drug therapy 10/14/2017   Chronic low back pain 10/14/2017  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   Chronic pain syndrome 10/14/2017   Pain in left foot 10/12/2017   Pain in right foot 10/12/2017   B12 deficiency 04/21/2016   Cobalamin deficiency 04/21/2016   Thrombocytopenia (HCC) 11/28/2015   Chronic pain 11/28/2015   Disease of jaw 11/28/2015   Hypokalemia 11/28/2015   Impaired fasting glucose 11/28/2015   Swelling of both lower extremities 11/28/2015   Tobacco abuse 11/28/2015   Vertigo 11/28/2015   Hereditary and idiopathic neuropathy 11/28/2015   Hereditary and idiopathic peripheral neuropathy 09/17/2009   Disorder of peripheral nervous system 09/17/2009   Alcoholic cirrhosis of liver (HCC) 10/02/2008   Jaundice,  non-neonatal 10/02/2008   Generalized abdominal pain 10/02/2008   Ascites 10/02/2008   Jaundice 10/02/2008   Esophageal varices (HCC) 04/04/2008   Esophageal varices (HCC) 04/04/2008   Alcohol abuse 03/16/2008   TRANSAMINASES, SERUM, ELEVATED 03/16/2008   Elevated levels of transaminase & lactic acid dehydrogenase 03/16/2008   SCARLET FEVER 03/14/2008   Hyperlipidemia 03/14/2008   External hemorrhoids 03/14/2008   Umbilical hernia 03/14/2008   Endometriosis 03/14/2008   DYSMENORRHEA 03/14/2008   INSOMNIA UNSPECIFIED 03/14/2008   Large liver 03/14/2008   Splenomegaly 03/14/2008   LIVER FUNCTION TESTS, ABNORMAL, HX OF 03/14/2008   History of disease of skin and subcutaneous tissue 03/14/2008   Insomnia 03/14/2008   Allergic rhinitis 05/31/2007   Gastroesophageal reflux disease 05/31/2007   Osteoarthritis 05/31/2007    PCP: Debrah Josette ORN., PA-C   REFERRING PROVIDER: Faye Lauraine PARAS, FNP   REFERRING DIAG: M54.50 (ICD-10-CM) - Low back pain, unspecified   Rationale for Evaluation and Treatment: Rehabilitation  THERAPY DIAG:  Other low back pain  Muscle weakness (generalized)  Other abnormalities of gait and mobility  Unsteadiness on feet  ONSET DATE: 7 YRS  SUBJECTIVE:                                                                                                                                                                                           SUBJECTIVE STATEMENT: Pt states her neck has been bothering her.  Pt went to pain management this AM and had steroid injection in her cervical.  Pt denies any adverse effects after prior Rx.   Pt hasn't been to the pool yet.    PERTINENT HISTORY:  Left ankle pain, S/P ankle replacement surgery At [facility] January 2023 Right wrist injury S/P pacemaker implant 2019 ,atrial flutter/AFib? Chronic low back pain, spinal cord stimulator 2017 Tobacco dependence UDS 03/21/2019, 02/22/20, 07/22/21, 08/11/22 Eliquis  SOAPP  score 5 Oswestry score 20%, 42% (04/07/2022) Pain agreement 08/11/22;02/02/23 Narcan  RX back and left  buttock pain; CT myelogram L3-4 spinal stenosis loss of spouse-January 2024  PAIN:  Are you having pain? Yes: NPRS scale:  6/10 current, 8/10 worst, 5/10 best Pain location:  bilat lumbar flanks Pain description: constant Aggravating factors: Amb > 25 yds makes my eyes tear Relieving factors: opioids, lying down  PRECAUTIONS: Other: spinal cord stimulation  RED FLAGS: None   WEIGHT BEARING RESTRICTIONS: No  FALLS:  Has patient fallen in last 6 months? No  LIVING ENVIRONMENT: Lives with: lives with their daughter Lives in: House/apartment Stairs: Yes: Internal: 15 steps; can reach both Has following equipment at home: Single point cane, Wheelchair (manual), Shower bench, Grab bars, and lift chair  OCCUPATION: disabled  PLOF: Requires assistive device for independence  PATIENT GOALS: decrease muscle tightness in LB m;  NEXT MD VISIT: sept  OBJECTIVE:   DIAGNOSTIC FINDINGS:  01/02/23 CT Lumbar Spine IMPRESSION: 1. Progressive, advanced right-sided disc degeneration at L3-4 with a prominent right-sided disc osteophyte complex resulting in moderate spinal stenosis, moderate right lateral recess stenosis, and severe right neural foraminal stenosis. 2. Progressive, severe multilevel facet arthrosis. Grade 1 anterolisthesis develops with standing at L4-5. 3. New mild spinal stenosis and mild right neural foraminal stenosis at L4-5. 4. New mild left neural foraminal stenosis at L5-S1. 5.  Aortic Atherosclerosis (ICD10-I70.0)  Left ankle 06/01/23 Impression:  1. Stable appearing left total ankle arthroplasty. 2. Stable midfoot fusion. 3. Diffuse demineralization. 4. Hindfoot valgus.     TODAY'S TREATMENT:                                                                                                                                Manual Therapy:    Pt received STM  to bilat lumbar paraspinals in prone and S/L'ing to improve soft tissue tightness and mobility and reduce pain.   See below for pt education.  PATIENT EDUCATION:  Education details:  PT instructed pt to perform her aquatic exercises, but not today due to her cervical injections.  PT also instructed pt in using the theracane.  POC/discharge planning, HEP. Person educated: Patient Education method: Explanation, demonstration, verbal cues Education comprehension: verbalized understanding, returned demonstration, verbal cues required  HOME EXERCISE PROGRAM: Access Code: [email or web address] URL: [email or web address] Date: 07/22/2023 Prepared by: Frankie Ziemba  This aquatic home exercise program from MedBridge utilizes pictures from land based exercises, but has been adapted prior to lamination and issuance.   Exercises - Noodle press  - 1 x daily - 1-3 x weekly - 1-3 sets - 10 reps - Drawing Bow  - 1 x daily - 1-3 x weekly - 1-3 sets - 10 reps - Walking  - Seated Straddle on [employer] Breast Stroke Arms and Bicycle Legs  - Noodle Stomp  - 1 x daily - 71-3 x weekly - 1-3 sets - 10 reps - Warrior I in [employer] with [employer]  - 1 x daily - 1-3 x weekly - 1-3 sets -  10 reps - Side to Side Hamstring Stretch with Noodle at Pool Wall  - 1 x daily - 71-3 x weekly - 1 sets - 3 reps - 10 hold  Updated HEP: Access Code: [email or web address] URL: [email or web address] Date: 08/03/2023 Prepared by: Mose Minerva  Exercises - Supine Transversus Abdominis Bracing - Hands on Stomach  - 2 x daily - 7 x weekly - 2 sets - 10 reps - 5 seconds hold - Supine March  - 1 x daily - 7 x weekly - 2 sets - 10 reps - Supine Posterior Pelvic Tilt  - 2 x daily - 7 x weekly - 2 sets - 10 reps  ASSESSMENT:  CLINICAL IMPRESSION: Pt arrived late to treatment today.  Pt received cervical steroid shots this AM, and PT deferred from performing exercises.  PT worked on improving lumbar soft  tissue tightness and mobility today.  PT instructed pt in home management strategies and instructed pt to perform her aquatic program.  PT educated pt concerning POC and answered pt's questions.  She responded well to Rx stating her back feels good and feels looser after manual therapy.      OBJECTIVE IMPAIRMENTS: Abnormal gait, decreased activity tolerance, decreased balance, decreased coordination, decreased endurance, decreased mobility, difficulty walking, decreased ROM, decreased strength, impaired flexibility, impaired sensation, postural dysfunction, and pain.   ACTIVITY LIMITATIONS: carrying, lifting, bending, sitting, standing, squatting, stairs, transfers, locomotion level, and caring for others  PARTICIPATION LIMITATIONS: meal prep, cleaning, laundry, driving, shopping, community activity, occupation, and yard work  PERSONAL FACTORS: Fitness, Past/current experiences, Time since onset of injury/illness/exacerbation, and 3+ comorbidities: see impression statement are also affecting patient's functional outcome.   REHAB POTENTIAL: Good  CLINICAL DECISION MAKING: Unstable/unpredictable  EVALUATION COMPLEXITY: High   GOALS: Goals reviewed with patient? Yes  SHORT TERM GOALS: Target date: 07/18/23  Pt will tolerate full aquatic sessions consistently without increase in pain and with improving function to demonstrate good toleration and effectiveness of intervention.  Baseline: Goal status: MET -06/23/23  2.  Pt will report decrease in usual pain by up to 2 NPRS for improved toleration to activity/quality of life and to demonstrate improved management of pain. Baseline: 8/10 Goal status:Met 07/08/23  3.  Pt will tolerate walking to or from setting along with tolerating full aquatic session without significant increase in pain or fatigue Baseline:  Goal status: Not Met 08/18/23  4.  Pt will improve on Tug test to <or= 14s to demonstrate improvement in lower extremity function,  mobility and decreased fall risk. Baseline: 17.62 / 17.07 on 10/28 Goal status: NOT MET    5.  Pt will be able to ambulate submerged ue support as needed up to 10 consecutive minutes to demonstrate improving toleration to amb. Baseline:  Goal status: Met 07/01/23    LONG TERM GOALS: Target date: 09/29/23  Pt to meet stated Foto Goal of 50% Baseline: 38% Goal status: PROGRESSING   11/12  2.  Pt will be indep with final HEP's (land and aquatic as appropriate) for continued management of condition  Baseline: none Goal status: (Aquatic MET).  Progressing  08/18/23  3.  Pt will improve strength in all areas listed by  up to 10lbs to demonstrate improved overall physical function Baseline: see chart Goal status: ONGOING  4.  Pt will report decrease in worst pain to </= 6/10 for improved toleration to activity/quality of life and to demonstrate improved management of pain.  Baseline: 10/10, 8/10 worst   08/18/23 Goal status:  progressing  08/18/23  5.  Pt will be able to amb to and from setting (400 - 500 ft each way) using AD as needed to demonstrate improved gait and endurance/aerobic capacity. Baseline: 75 ft Goal status: NOT MET  6.  Pt to improve lumbar ROM by 25% for improved function Baseline: see chart Goal status: GOAL MET  11/12  PLAN:  PT FREQUENCY: 1x/wk  PT DURATION:  6 weeks    PLANNED INTERVENTIONS: Therapeutic exercises, Therapeutic activity, Neuromuscular re-education, Balance training, Gait training, Patient/Family education, Self Care, Joint mobilization, Joint manipulation, Stair training, Orthotic/Fit training, DME instructions, Aquatic Therapy, Dry Needling, Electrical stimulation, Spinal mobilization, Cryotherapy, Moist heat, Taping, Traction, Ionotophoresis 4mg /ml Dexamethasone , Manual therapy, and Re-evaluation.  PLAN FOR NEXT SESSION: possible discharge in the next 1-2 visits.    Leigh Minerva III PT, DPT 09/29/23 12:43 PM  PHYSICAL THERAPY  DISCHARGE SUMMARY  Visits from Start of Care: 20  Current functional level related to goals / functional outcomes: See above   Remaining deficits: See above   Education / Equipment: See above   Patient was seen in PT from 06/02/23 - 09/28/23.  The plan was for pt to be discharged in the next 1-2 visits.  She No-showed her appt on 10/10/23 and did not reschedule.  She will be considered discharged from skilled PT.  For goal assessment, see above.   Leigh Minerva III PT, DPT 08/01/24 3:30 PM

## 2023-09-29 ENCOUNTER — Encounter (HOSPITAL_BASED_OUTPATIENT_CLINIC_OR_DEPARTMENT_OTHER): Payer: Self-pay | Admitting: Physical Therapy

## 2023-10-05 ENCOUNTER — Other Ambulatory Visit (HOSPITAL_BASED_OUTPATIENT_CLINIC_OR_DEPARTMENT_OTHER): Payer: Self-pay

## 2023-10-05 ENCOUNTER — Encounter (HOSPITAL_BASED_OUTPATIENT_CLINIC_OR_DEPARTMENT_OTHER): Payer: BC Managed Care – PPO | Admitting: Physical Therapy

## 2023-10-10 ENCOUNTER — Ambulatory Visit (HOSPITAL_BASED_OUTPATIENT_CLINIC_OR_DEPARTMENT_OTHER): Payer: 59 | Attending: Family Medicine | Admitting: Physical Therapy

## 2023-10-10 ENCOUNTER — Encounter (HOSPITAL_BASED_OUTPATIENT_CLINIC_OR_DEPARTMENT_OTHER): Payer: Self-pay

## 2023-10-10 DIAGNOSIS — M5459 Other low back pain: Secondary | ICD-10-CM | POA: Insufficient documentation

## 2023-10-10 DIAGNOSIS — M6281 Muscle weakness (generalized): Secondary | ICD-10-CM | POA: Insufficient documentation

## 2023-10-10 DIAGNOSIS — R2681 Unsteadiness on feet: Secondary | ICD-10-CM | POA: Insufficient documentation

## 2023-10-10 DIAGNOSIS — R2689 Other abnormalities of gait and mobility: Secondary | ICD-10-CM | POA: Insufficient documentation

## 2023-10-15 ENCOUNTER — Encounter: Payer: Self-pay | Admitting: Cardiovascular Disease

## 2023-10-30 ENCOUNTER — Other Ambulatory Visit (HOSPITAL_BASED_OUTPATIENT_CLINIC_OR_DEPARTMENT_OTHER): Payer: Self-pay

## 2023-10-30 MED ORDER — NALOXONE HCL 4 MG/0.1ML NA LIQD
1.0000 | NASAL | 0 refills | Status: AC
  Filled 2023-10-30: qty 2, 1d supply, fill #0

## 2023-10-30 MED ORDER — HYDROMORPHONE HCL 4 MG PO TABS
4.0000 mg | ORAL_TABLET | ORAL | 0 refills | Status: DC
  Filled 2023-10-30: qty 150, 25d supply, fill #0

## 2023-11-02 ENCOUNTER — Other Ambulatory Visit (HOSPITAL_BASED_OUTPATIENT_CLINIC_OR_DEPARTMENT_OTHER): Payer: Self-pay

## 2023-11-09 ENCOUNTER — Ambulatory Visit (INDEPENDENT_AMBULATORY_CARE_PROVIDER_SITE_OTHER): Payer: BC Managed Care – PPO

## 2023-11-09 DIAGNOSIS — I495 Sick sinus syndrome: Secondary | ICD-10-CM | POA: Diagnosis not present

## 2023-11-09 LAB — CUP PACEART REMOTE DEVICE CHECK
Battery Remaining Longevity: 70 mo
Battery Remaining Percentage: 53 %
Battery Voltage: 2.99 V
Brady Statistic AP VP Percent: 1 %
Brady Statistic AP VS Percent: 83 %
Brady Statistic AS VP Percent: 1 %
Brady Statistic AS VS Percent: 17 %
Brady Statistic RA Percent Paced: 83 %
Brady Statistic RV Percent Paced: 1 %
Date Time Interrogation Session: 20250203020017
Implantable Lead Connection Status: 753985
Implantable Lead Connection Status: 753985
Implantable Lead Implant Date: 20191031
Implantable Lead Implant Date: 20191031
Implantable Lead Location: 753859
Implantable Lead Location: 753860
Implantable Pulse Generator Implant Date: 20191031
Lead Channel Impedance Value: 550 Ohm
Lead Channel Impedance Value: 610 Ohm
Lead Channel Pacing Threshold Amplitude: 0.5 V
Lead Channel Pacing Threshold Amplitude: 0.5 V
Lead Channel Pacing Threshold Pulse Width: 0.5 ms
Lead Channel Pacing Threshold Pulse Width: 0.5 ms
Lead Channel Sensing Intrinsic Amplitude: 12 mV
Lead Channel Sensing Intrinsic Amplitude: 5 mV
Lead Channel Setting Pacing Amplitude: 0.75 V
Lead Channel Setting Pacing Amplitude: 1.5 V
Lead Channel Setting Pacing Pulse Width: 0.5 ms
Lead Channel Setting Sensing Sensitivity: 2 mV
Pulse Gen Model: 2272
Pulse Gen Serial Number: 9079034

## 2023-11-10 ENCOUNTER — Other Ambulatory Visit: Payer: Self-pay

## 2023-11-10 ENCOUNTER — Inpatient Hospital Stay (HOSPITAL_COMMUNITY)
Admission: EM | Admit: 2023-11-10 | Discharge: 2023-11-14 | DRG: 372 | Disposition: A | Discharge status: Home / Self Care | Payer: 59 | Attending: Family Medicine | Admitting: Family Medicine

## 2023-11-10 ENCOUNTER — Inpatient Hospital Stay (HOSPITAL_COMMUNITY): Payer: 59

## 2023-11-10 ENCOUNTER — Encounter (HOSPITAL_COMMUNITY): Payer: Self-pay

## 2023-11-10 DIAGNOSIS — K7030 Alcoholic cirrhosis of liver without ascites: Secondary | ICD-10-CM | POA: Diagnosis present

## 2023-11-10 DIAGNOSIS — Z79890 Hormone replacement therapy (postmenopausal): Secondary | ICD-10-CM | POA: Diagnosis not present

## 2023-11-10 DIAGNOSIS — I10 Essential (primary) hypertension: Secondary | ICD-10-CM | POA: Diagnosis present

## 2023-11-10 DIAGNOSIS — I959 Hypotension, unspecified: Secondary | ICD-10-CM | POA: Diagnosis present

## 2023-11-10 DIAGNOSIS — K3580 Unspecified acute appendicitis: Principal | ICD-10-CM

## 2023-11-10 DIAGNOSIS — Z6837 Body mass index (BMI) 37.0-37.9, adult: Secondary | ICD-10-CM

## 2023-11-10 DIAGNOSIS — Z950 Presence of cardiac pacemaker: Secondary | ICD-10-CM

## 2023-11-10 DIAGNOSIS — Z7901 Long term (current) use of anticoagulants: Secondary | ICD-10-CM

## 2023-11-10 DIAGNOSIS — I110 Hypertensive heart disease with heart failure: Secondary | ICD-10-CM | POA: Diagnosis present

## 2023-11-10 DIAGNOSIS — D6959 Other secondary thrombocytopenia: Secondary | ICD-10-CM | POA: Diagnosis present

## 2023-11-10 DIAGNOSIS — K3533 Acute appendicitis with perforation and localized peritonitis, with abscess: Principal | ICD-10-CM | POA: Diagnosis present

## 2023-11-10 DIAGNOSIS — K8020 Calculus of gallbladder without cholecystitis without obstruction: Secondary | ICD-10-CM | POA: Diagnosis present

## 2023-11-10 DIAGNOSIS — I5022 Chronic systolic (congestive) heart failure: Secondary | ICD-10-CM | POA: Diagnosis present

## 2023-11-10 DIAGNOSIS — Z7951 Long term (current) use of inhaled steroids: Secondary | ICD-10-CM

## 2023-11-10 DIAGNOSIS — E785 Hyperlipidemia, unspecified: Secondary | ICD-10-CM | POA: Diagnosis present

## 2023-11-10 DIAGNOSIS — J449 Chronic obstructive pulmonary disease, unspecified: Secondary | ICD-10-CM | POA: Diagnosis present

## 2023-11-10 DIAGNOSIS — Z882 Allergy status to sulfonamides status: Secondary | ICD-10-CM | POA: Diagnosis not present

## 2023-11-10 DIAGNOSIS — I4892 Unspecified atrial flutter: Secondary | ICD-10-CM | POA: Diagnosis present

## 2023-11-10 DIAGNOSIS — F17210 Nicotine dependence, cigarettes, uncomplicated: Secondary | ICD-10-CM | POA: Diagnosis present

## 2023-11-10 DIAGNOSIS — J4489 Other specified chronic obstructive pulmonary disease: Secondary | ICD-10-CM | POA: Diagnosis present

## 2023-11-10 DIAGNOSIS — E039 Hypothyroidism, unspecified: Secondary | ICD-10-CM | POA: Diagnosis present

## 2023-11-10 DIAGNOSIS — E669 Obesity, unspecified: Secondary | ICD-10-CM | POA: Diagnosis present

## 2023-11-10 DIAGNOSIS — Z881 Allergy status to other antibiotic agents status: Secondary | ICD-10-CM | POA: Diagnosis not present

## 2023-11-10 DIAGNOSIS — Z720 Tobacco use: Secondary | ICD-10-CM | POA: Insufficient documentation

## 2023-11-10 DIAGNOSIS — Z1611 Resistance to penicillins: Secondary | ICD-10-CM | POA: Diagnosis present

## 2023-11-10 DIAGNOSIS — K3532 Acute appendicitis with perforation and localized peritonitis, without abscess: Secondary | ICD-10-CM | POA: Diagnosis present

## 2023-11-10 DIAGNOSIS — Z79899 Other long term (current) drug therapy: Secondary | ICD-10-CM

## 2023-11-10 DIAGNOSIS — F1010 Alcohol abuse, uncomplicated: Secondary | ICD-10-CM | POA: Diagnosis present

## 2023-11-10 DIAGNOSIS — I495 Sick sinus syndrome: Secondary | ICD-10-CM | POA: Diagnosis present

## 2023-11-10 DIAGNOSIS — K5900 Constipation, unspecified: Secondary | ICD-10-CM | POA: Diagnosis present

## 2023-11-10 DIAGNOSIS — K219 Gastro-esophageal reflux disease without esophagitis: Secondary | ICD-10-CM | POA: Diagnosis present

## 2023-11-10 DIAGNOSIS — E876 Hypokalemia: Secondary | ICD-10-CM | POA: Diagnosis present

## 2023-11-10 DIAGNOSIS — G894 Chronic pain syndrome: Secondary | ICD-10-CM | POA: Diagnosis present

## 2023-11-10 DIAGNOSIS — K7460 Unspecified cirrhosis of liver: Secondary | ICD-10-CM | POA: Diagnosis present

## 2023-11-10 DIAGNOSIS — E538 Deficiency of other specified B group vitamins: Secondary | ICD-10-CM | POA: Diagnosis present

## 2023-11-10 DIAGNOSIS — D696 Thrombocytopenia, unspecified: Secondary | ICD-10-CM | POA: Diagnosis present

## 2023-11-10 DIAGNOSIS — I480 Paroxysmal atrial fibrillation: Secondary | ICD-10-CM | POA: Diagnosis present

## 2023-11-10 LAB — CBC WITH DIFFERENTIAL/PLATELET
Abs Immature Granulocytes: 0.03 10*3/uL (ref 0.00–0.07)
Basophils Absolute: 0 10*3/uL (ref 0.0–0.1)
Basophils Relative: 0 %
Eosinophils Absolute: 0.1 10*3/uL (ref 0.0–0.5)
Eosinophils Relative: 1 %
HCT: 40.9 % (ref 36.0–46.0)
Hemoglobin: 13.4 g/dL (ref 12.0–15.0)
Immature Granulocytes: 1 %
Lymphocytes Relative: 19 %
Lymphs Abs: 1.2 10*3/uL (ref 0.7–4.0)
MCH: 33.2 pg (ref 26.0–34.0)
MCHC: 32.8 g/dL (ref 30.0–36.0)
MCV: 101.2 fL — ABNORMAL HIGH (ref 80.0–100.0)
Monocytes Absolute: 0.8 10*3/uL (ref 0.1–1.0)
Monocytes Relative: 12 %
Neutro Abs: 4.2 10*3/uL (ref 1.7–7.7)
Neutrophils Relative %: 67 %
Platelets: 126 10*3/uL — ABNORMAL LOW (ref 150–400)
RBC: 4.04 MIL/uL (ref 3.87–5.11)
RDW: 14.3 % (ref 11.5–15.5)
WBC: 6.3 10*3/uL (ref 4.0–10.5)
nRBC: 0 % (ref 0.0–0.2)

## 2023-11-10 LAB — COMPREHENSIVE METABOLIC PANEL
ALT: 28 U/L (ref 0–44)
AST: 33 U/L (ref 15–41)
Albumin: 3.1 g/dL — ABNORMAL LOW (ref 3.5–5.0)
Alkaline Phosphatase: 125 U/L (ref 38–126)
Anion gap: 14 (ref 5–15)
BUN: 11 mg/dL (ref 8–23)
CO2: 21 mmol/L — ABNORMAL LOW (ref 22–32)
Calcium: 9.3 mg/dL (ref 8.9–10.3)
Chloride: 96 mmol/L — ABNORMAL LOW (ref 98–111)
Creatinine, Ser: 1.11 mg/dL — ABNORMAL HIGH (ref 0.44–1.00)
GFR, Estimated: 56 mL/min — ABNORMAL LOW (ref >60)
Glucose, Bld: 221 mg/dL — ABNORMAL HIGH (ref 70–99)
Potassium: 3.3 mmol/L — ABNORMAL LOW (ref 3.5–5.1)
Sodium: 131 mmol/L — ABNORMAL LOW (ref 135–145)
Total Bilirubin: 0.8 mg/dL (ref 0.0–1.2)
Total Protein: 7.4 g/dL (ref 6.5–8.1)

## 2023-11-10 LAB — LIPASE, BLOOD: Lipase: 24 U/L (ref 11–51)

## 2023-11-10 MED ORDER — MORPHINE SULFATE (PF) 2 MG/ML IV SOLN: 2.0000 mg | Freq: Once | INTRAVENOUS | Status: DC | PRN

## 2023-11-10 MED ORDER — METOPROLOL SUCCINATE ER 50 MG PO TB24: 100.0000 mg | ORAL_TABLET | ORAL | Status: DC

## 2023-11-10 MED ORDER — OXYCODONE-ACETAMINOPHEN 5-325 MG PO TABS
1.0000 | ORAL_TABLET | Freq: Three times a day (TID) | ORAL | Status: DC | PRN
  Administered 2023-11-11 (×2): 1 via ORAL
  Filled 2023-11-10 (×3): qty 1

## 2023-11-10 MED ORDER — POTASSIUM CHLORIDE ER 10 MEQ PO TBCR
10.0000 meq | EXTENDED_RELEASE_TABLET | Freq: Every day | ORAL | Status: DC
  Administered 2023-11-11 – 2023-11-14 (×4): 10 meq via ORAL
  Filled 2023-11-10 (×8): qty 1

## 2023-11-10 MED ORDER — ONDANSETRON HCL 4 MG PO TABS: 4.0000 mg | ORAL_TABLET | Freq: Four times a day (QID) | ORAL | Status: DC | PRN

## 2023-11-10 MED ORDER — SPIRONOLACTONE 25 MG PO TABS
25.0000 mg | ORAL_TABLET | Freq: Every day | ORAL | Status: DC
  Administered 2023-11-11: 25 mg via ORAL
  Filled 2023-11-10: qty 1

## 2023-11-10 MED ORDER — ACETAMINOPHEN 325 MG PO TABS: 650.0000 mg | ORAL_TABLET | Freq: Four times a day (QID) | ORAL | Status: DC | PRN

## 2023-11-10 MED ORDER — MOMETASONE FURO-FORMOTEROL FUM 200-5 MCG/ACT IN AERO
2.0000 | INHALATION_SPRAY | Freq: Two times a day (BID) | RESPIRATORY_TRACT | Status: DC
  Administered 2023-11-11 – 2023-11-14 (×7): 2 via RESPIRATORY_TRACT
  Filled 2023-11-10: qty 8.8

## 2023-11-10 MED ORDER — SENNOSIDES-DOCUSATE SODIUM 8.6-50 MG PO TABS: 1.0000 | ORAL_TABLET | Freq: Every evening | ORAL | Status: DC | PRN

## 2023-11-10 MED ORDER — MORPHINE SULFATE (PF) 2 MG/ML IV SOLN
2.0000 mg | INTRAVENOUS | Status: DC | PRN
  Administered 2023-11-10: 2 mg via INTRAVENOUS
  Filled 2023-11-10: qty 1

## 2023-11-10 MED ORDER — SODIUM CHLORIDE 0.9% FLUSH
3.0000 mL | Freq: Two times a day (BID) | INTRAVENOUS | Status: DC
  Administered 2023-11-11: 3 mL via INTRAVENOUS

## 2023-11-10 MED ORDER — ROSUVASTATIN CALCIUM 20 MG PO TABS
20.0000 mg | ORAL_TABLET | Freq: Every day | ORAL | Status: DC
  Administered 2023-11-11 – 2023-11-14 (×4): 20 mg via ORAL
  Filled 2023-11-10 (×4): qty 1

## 2023-11-10 MED ORDER — LACTATED RINGERS IV SOLN: INTRAVENOUS | Status: AC

## 2023-11-10 MED ORDER — ONDANSETRON HCL 4 MG/2ML IJ SOLN: 4.0000 mg | Freq: Once | INTRAMUSCULAR | Status: DC

## 2023-11-10 MED ORDER — METOPROLOL SUCCINATE ER 50 MG PO TB24
150.0000 mg | ORAL_TABLET | Freq: Every day | ORAL | Status: DC
  Administered 2023-11-11: 150 mg via ORAL
  Filled 2023-11-10 (×2): qty 3

## 2023-11-10 MED ORDER — SENNA 8.6 MG PO TABS
2.0000 | ORAL_TABLET | Freq: Every day | ORAL | Status: DC
Start: 2023-11-10 — End: 2023-11-14
  Administered 2023-11-11 – 2023-11-13 (×3): 17.2 mg via ORAL
  Filled 2023-11-10 (×3): qty 2

## 2023-11-10 MED ORDER — OXYCODONE HCL 5 MG PO TABS
5.0000 mg | ORAL_TABLET | Freq: Three times a day (TID) | ORAL | Status: DC | PRN
  Administered 2023-11-11 (×2): 5 mg via ORAL
  Filled 2023-11-10 (×2): qty 1

## 2023-11-10 MED ORDER — CYCLOSPORINE 0.05 % OP EMUL
1.0000 [drp] | Freq: Two times a day (BID) | OPHTHALMIC | Status: DC
  Administered 2023-11-11 – 2023-11-14 (×7): 1 [drp] via OPHTHALMIC
  Filled 2023-11-10 (×8): qty 30

## 2023-11-10 MED ORDER — PIPERACILLIN-TAZOBACTAM 3.375 G IVPB
3.3750 g | Freq: Three times a day (TID) | INTRAVENOUS | Status: DC
  Administered 2023-11-11 – 2023-11-14 (×10): 3.375 g via INTRAVENOUS
  Filled 2023-11-10 (×10): qty 50

## 2023-11-10 MED ORDER — ALBUTEROL SULFATE (2.5 MG/3ML) 0.083% IN NEBU: 3.0000 mL | INHALATION_SOLUTION | Freq: Four times a day (QID) | RESPIRATORY_TRACT | Status: DC | PRN

## 2023-11-10 MED ORDER — LOSARTAN POTASSIUM 50 MG PO TABS
50.0000 mg | ORAL_TABLET | Freq: Every day | ORAL | Status: DC
  Administered 2023-11-11: 50 mg via ORAL
  Filled 2023-11-10: qty 2

## 2023-11-10 MED ORDER — NICOTINE 21 MG/24HR TD PT24
21.0000 mg | MEDICATED_PATCH | Freq: Every day | TRANSDERMAL | Status: DC
  Administered 2023-11-10 – 2023-11-14 (×4): 21 mg via TRANSDERMAL
  Filled 2023-11-10 (×4): qty 1

## 2023-11-10 MED ORDER — POTASSIUM CHLORIDE 20 MEQ PO PACK
40.0000 meq | PACK | Freq: Once | ORAL | Status: AC
  Administered 2023-11-10: 40 meq via ORAL
  Filled 2023-11-10: qty 2

## 2023-11-10 MED ORDER — PIPERACILLIN-TAZOBACTAM 3.375 G IVPB 30 MIN
3.3750 g | Freq: Once | INTRAVENOUS | Status: AC
  Administered 2023-11-10: 3.375 g via INTRAVENOUS
  Filled 2023-11-10: qty 50

## 2023-11-10 MED ORDER — ONDANSETRON HCL 4 MG/2ML IJ SOLN
4.0000 mg | Freq: Four times a day (QID) | INTRAMUSCULAR | Status: DC | PRN
  Administered 2023-11-10: 4 mg via INTRAVENOUS
  Filled 2023-11-10: qty 2

## 2023-11-10 MED ORDER — LEVOTHYROXINE SODIUM 25 MCG PO TABS
25.0000 ug | ORAL_TABLET | Freq: Every day | ORAL | Status: DC
  Administered 2023-11-11 – 2023-11-14 (×4): 25 ug via ORAL
  Filled 2023-11-10 (×4): qty 1

## 2023-11-10 MED ORDER — OXYCODONE-ACETAMINOPHEN 10-325 MG PO TABS: 1.0000 | ORAL_TABLET | Freq: Three times a day (TID) | ORAL | Status: DC | PRN

## 2023-11-10 MED ORDER — PREGABALIN 75 MG PO CAPS
150.0000 mg | ORAL_CAPSULE | Freq: Two times a day (BID) | ORAL | Status: DC
  Administered 2023-11-11 – 2023-11-14 (×8): 150 mg via ORAL
  Filled 2023-11-10: qty 2
  Filled 2023-11-10: qty 3
  Filled 2023-11-10: qty 2
  Filled 2023-11-10: qty 3
  Filled 2023-11-10 (×4): qty 2

## 2023-11-10 MED ORDER — ACETAMINOPHEN 650 MG RE SUPP: 650.0000 mg | Freq: Four times a day (QID) | RECTAL | Status: DC | PRN

## 2023-11-10 NOTE — ED Provider Notes (Signed)
 [place] EMERGENCY DEPARTMENT AT [facility] Provider Note   CSN: [id number] Arrival date & time: 11/10/23  1651     History  Chief Complaint  Patient presents with   Abdominal Pain   HPI [name] is a 63 y.o. female paroxysmal A-fib on Eliquis , tachybrady syndrome with an Abbott PPM, COPD, hypothyroidism, alcoholic cirrhosis of the liver with esophageal varices, and chronic pain presenting for abdominal pain.  Started Friday.  Located in the right lower quadrant.  Endorses nausea but no vomiting or diarrhea.  Has had normal bowel movements.  Endorses subjective fever at home.  Was seen for this complaint earlier today by her PCP who ordered a CT scan.  CT scan did reveal appendicitis with a contained periappendiceal abscess.  Sent here for further evaluation.  She has not had anything to eat since Friday.  States she last had something to drink around 1:30 PM today.  States her pain is minimal at this time as long as she is not moving.  Reports that she did take her home dose of p.o. Dilaudid  around 4 PM this afternoon.  Has still been taking her Eliquis  daily.  Last was this morning.   Abdominal Pain      Home Medications Prior to Admission medications   Medication Sig Start Date End Date Taking? Authorizing Provider  ADVAIR DISKUS 250-50 MCG/ACT AEPB Inhale 1 puff into the lungs 2 (two) times daily. Patient not taking: Reported on 08/31/2023 09/15/22   [provider]  albuterol  (PROVENTIL  HFA;VENTOLIN  HFA) 108 (90 Base) MCG/ACT inhaler Inhale 2 puffs into the lungs every 6 (six) hours as needed for wheezing or shortness of breath.  05/14/18   [provider]  B Complex Vitamins (B COMPLEX PO) Take 1 tablet by mouth daily.    [provider]  BREO ELLIPTA  200-25 MCG/INH AEPB Inhale 1 puff into the lungs daily. 05/17/18   [provider]  Coenzyme Q10 (CO Q 10) 100 MG CAPS Take 100 mg by mouth at bedtime.    [provider]   ELIQUIS  5 MG TABS tablet TAKE 1 TABLET BY MOUTH TWICE A DAY 09/16/23   Delford Maude BROCKS, MD  famotidine  (PEPCID ) 20 MG tablet Take 20 mg by mouth 2 (two) times daily. New Zantac 360T 05/26/18   [provider]  fluticasone  (FLONASE ) 50 MCG/ACT nasal spray Place 2 sprays into both nostrils as needed.    [provider]  folic acid  (FOLVITE ) 1 MG tablet Take 1 mg by mouth daily. 04/01/16   [provider]  HYDROmorphone  (DILAUDID ) 4 MG tablet Take 4 mg by mouth 5 (five) times daily as needed for severe pain. Patient not taking: Reported on 08/31/2023 03/03/20   [provider]  HYDROmorphone  (DILAUDID ) 4 MG tablet Take 1 tablet by mouth every 4-6 hours as needed. 10/30/23     levothyroxine  (SYNTHROID ) 25 MCG tablet Take 25 mcg by mouth daily before breakfast.  03/31/16   [provider]  losartan -hydrochlorothiazide  (HYZAAR) 50-12.5 MG tablet Take 1 tablet by mouth daily. 07/28/23   Nishan, Peter C, MD  magnesium  gluconate (MAGONATE) 500 MG tablet Take 500 mg by mouth at bedtime.    [provider]  metaxalone  (SKELAXIN ) 800 MG tablet Take 800 mg by mouth 3 (three) times daily. 02/20/20   [provider]  metoprolol  succinate (TOPROL -XL) 100 MG 24 hr tablet Take 1 tablet (100 mg total) by mouth daily. Take with or immediately following a meal. Take  with 50 mg tab to total 150 mg daily. 11/20/22   Camnitz, Soyla Lunger, MD  metoprolol  succinate (TOPROL -XL) 50 MG 24 hr tablet TAKE 1 TABLET DAILY WITH OR IMMEDIATELY FOLLOWING A MEAL. TAKE WITH 100 MG TAB TO TOTAL 150 MG DAILY 02/06/23   Camnitz, Soyla Lunger, MD  naloxone  (NARCAN ) nasal spray 4 mg/0.1 mL Place 1 spray into the nose as directed. 09/02/22   [provider]  naloxone  (NARCAN ) nasal spray 4 mg/0.1 mL Take 1 spray into nostril(s) every 3 minutes until patient awakes or EMS arrives. 10/30/23     Olopatadine  HCl (PATADAY ) 0.7 % SOLN Place 1 drop into both eyes daily. 12/29/19    [provider]  oxyCODONE -acetaminophen  (PERCOCET) 10-325 MG tablet Take 1 tablet by mouth every 8 (eight) hours as needed. 08/14/23   [provider]  potassium chloride  (KLOR-CON ) 10 MEQ tablet Take 2 tablets (20 mEq total) by mouth daily. 03/13/23   Lesia Ozell Barter, PA-C  pregabalin  (LYRICA ) 150 MG capsule Take 150 mg by mouth 2 (two) times daily. Additional 150 mg in the middle of the day if needed 11/15/15   [provider]  PROCTOFOAM [facility] rectal foam Place 1 applicator rectally 3 (three) times daily.    [provider]  RESTASIS  MULTIDOSE 0.05 % ophthalmic emulsion Place 1 drop into both eyes every evening. 03/12/20   [provider]  Rosuvastatin  Calcium  20 MG CPSP Take 20 mg by mouth daily.    [provider]  senna (SENOKOT) 8.6 MG TABS tablet Take 2 tablets by mouth at bedtime.    [provider]  spironolactone  (ALDACTONE ) 25 MG tablet Take 1 tablet (25 mg total) by mouth daily. 11/14/22   Lesia Ozell Barter, PA-C  tiZANidine  (ZANAFLEX ) 4 MG tablet Take 8 mg by mouth at bedtime. 12/26/19   [provider]  Vitamin D , Ergocalciferol , (DRISDOL ) 50000 units CAPS capsule Take 50,000 Units by mouth every Wednesday.  03/16/16   [provider]      Allergies    Short ragweed pollen ext, Sulfa antibiotics, Sulfamethoxazole, Vancomycin, Teicoplanin, and Azithromycin    Review of Systems   Review of Systems  Gastrointestinal:  Positive for abdominal pain.    Physical Exam   Vitals:   11/10/23 1658 11/10/23 1842  BP: 91/66 107/64  Pulse: 85 72  Resp: 16 16  Temp: 99 F (37.2 C) 98.9 F (37.2 C)  SpO2: 97% 100%    CONSTITUTIONAL:  well-appearing, NAD NEURO:  Alert and oriented x 3, CN 3-12 grossly intact EYES:  eyes equal and reactive ENT/NECK:  Supple, no stridor  CARDIO:  regular rate and rhythm, appears well-perfused  PULM:  No respiratory distress, CTAB GI/GU:  non-distended, soft, tenderness  with guarding about McBurney's point MSK/SPINE:  No gross deformities, no edema, moves all extremities  SKIN:  no rash, atraumatic  *Additional and/or pertinent findings included in MDM below  ED Results / Procedures / Treatments   Labs (all labs ordered are listed, but only abnormal results are displayed) Labs Reviewed  CBC WITH DIFFERENTIAL/PLATELET - Abnormal; Notable for the following components:      Result Value   MCV 101.2 (*)    Platelets 126 (*)    All other components within normal limits  COMPREHENSIVE METABOLIC PANEL  LIPASE, BLOOD  URINALYSIS, ROUTINE W REFLEX MICROSCOPIC    EKG None  Radiology CUP PACEART REMOTE DEVICE CHECK Result Date: 11/09/2023 Scheduled remote reviewed. Normal device function.  1 AHR lasting 6  seconds. Next remote 91 days. - CS, CVRS   Procedures Procedures    Medications Ordered in ED Medications  ondansetron  (ZOFRAN ) injection 4 mg (has no administration in time range)  piperacillin -tazobactam (ZOSYN ) IVPB 3.375 g (0 g Intravenous Stopped 11/10/23 1842)    ED Course/ Medical Decision Making/ A&P Clinical Course as of 11/10/23 1855  Tue Nov 10, 2023  1830 Per Dr. Belinda general surgery, recommends IR drain placement tomorrow.  Holding Eliquis .  [JR]    Clinical Course User Index [JR] Stacie Knutzen K, PA-C                                 Medical Decision Making Amount and/or Complexity of Data Reviewed Labs: ordered.  Risk Prescription drug management.   Initial Impression and Ddx 63 year old well-appearing female presenting for abdominal pain.  CT scan earlier today at outside facility revealed concern for ruptured appendicitis with associated abscess.  Patient appears well and nontoxic.  DDx includes appendicitis, sepsis, electrolyte derangement, other. Patient PMH that increases complexity of ED encounter:  paroxysmal A-fib on Eliquis , tachybrady syndrome with an Abbott PPM, COPD, hypothyroidism, alcoholic cirrhosis of the  liver with esophageal varices, and chronic pain  Interpretation of Diagnostics - I independent reviewed and interpreted the labs as followed: No acute derangements on her CBC, lipase CMP and UA are still pending  - I independently visualized the following imaging with scope of interpretation limited to determining acute life threatening conditions related to emergency care: CT abdomen/pelvis from OSH revealed ruptured appendicitis with periappendiceal abscess measuring approximately 4.0 x 2.5 x 4.9 cm.  Patient Reassessment and Ultimate Disposition/Management Treated with Zosyn  and Zofran .  Discussed patient with Dr. Belinda of general surgery who advised to admit to medicine for plans of IR drain tomorrow.  Also advised to hold Eliquis  for now.  Admitted to hospital service with Dr. Tobie.  Patient management required discussion with the following services or consulting groups:  Hospitalist Service and General/Trauma Surgery  Complexity of Problems Addressed Acute complicated illness or Injury  Additional Data Reviewed and Analyzed Further history obtained from: Past medical history and medications listed in the EMR and Prior ED visit notes  Patient Encounter Risk Assessment Consideration of hospitalization         Final Clinical Impression(s) / ED Diagnoses Final diagnoses:  Acute appendicitis, unspecified acute appendicitis type    Rx / DC Orders ED Discharge Orders     None         Lang Norleen POUR, PA-C 11/10/23 DANIAL Suzette Pac, MD 11/11/23 [phone]

## 2023-11-10 NOTE — Progress Notes (Signed)
 Pharmacy Antibiotic Note  [name] is a 63 y.o. female with hx EtOH cirrhosis who was sent to the ED on 11/10/23 after an outpatient abdominal CT resulted back with  acute appendicitis with a contained periappendiceal abscess. Pharmacy has been consulted to dose zosyn  for infection.  Plan: - zosyn  3.375 gm IV x1 over 30 min, then 3.375 gm IV q8h (infuse over 4 hrs) - With good renal function, pharmacy will sign off for abx consult.  Reconsult us  if need further assistance. ____________________________________  Height: 5' 9 (175.3 cm) Weight: 114.8 kg (253 lb 1.4 oz) IBW/kg (Calculated) : 66.2  Temp (24hrs), Avg:99 F (37.2 C), Min:98.9 F (37.2 C), Max:99 F (37.2 C)  Recent Labs  Lab 11/10/23 1730  WBC 6.3  CREATININE 1.11*    Estimated Creatinine Clearance: 71 mL/min (A) (by C-G formula based on SCr of 1.11 mg/dL (H)).    Allergies  Allergen Reactions   Ambrosia Artemisiifolia (Ragweed) Skin Test Cough   Short Ragweed Pollen Ext Other (See Comments) and Cough    Eye Redness, too    Sulfa Antibiotics Hives, Rash and Other (See Comments)    GI Upset, also    Vancomycin Hives   Teicoplanin Other (See Comments)    Trade name(s): Targocid and Ticocin = GI Upset   Azithromycin Rash     Thank you for allowing pharmacy to be a part of this patient's care.  [name] 11/10/2023 8:21 PM

## 2023-11-10 NOTE — ED Triage Notes (Addendum)
Pt sent by PCP for ruptured appendix. Pt had CT scan today and confirmed this. Pt states her pain is fine unless she moves. Denies other sx. Last fluid intake was at 1330. No food intake in 4 days

## 2023-11-10 NOTE — H&P (Signed)
 History and Physical    [name] FMW:[id number] DOB: January 11, 1961 DOA: 11/10/2023  PCP: Debrah Josette ORN., PA-C  Patient coming from: Home  I have personally briefly reviewed patient's old medical records in [facility]  Chief Complaint: Abdominal pain  HPI: [name] is a 63 y.o. female with medical history significant for paroxysmal A-fib/flutter s/p ablation on Eliquis , HFmrEF, tachy-brady syndrome s/p PPM, cirrhosis, HTN, HLD, hypothyroidism, chronic back pain/lumbar stenosis s/p spinal cord stimulator on chronic narcotics, tobacco use who presented to the ED for abdominal pain and abnormal outpatient CT scan.  Patient presenting with abdominal pain.  Patient states that she ate a sandwich this past Friday and afterwards began to have central abdominal pain which has since radiated to her right lower quadrant.  Pain has been progressively worsening and limiting her oral intake.  She reports having a fever at home associated with chills and diaphoresis.  She denies any nausea, vomiting, diarrhea, dysuria, blood in her stools or dark black stools.  She reports a chronic smoker's cough occasionally productive of clear sputum which is unchanged from her baseline.  She denies chest pain.  She is on chronic anticoagulation with Eliquis  which she last took morning of 2/4.  She reports smoking about 1 pack/day.  Outpatient CT scan performed earlier today in the [facility] (reporting Care Everywhere): 1.  Acute perforated appendicitis with a contained periappendiceal abscess as measured above.  2.  Cholelithiasis and suspected choledocholithiasis. Mild upstream dilation of the common bile duct. Recommend correlation with LFTs and gastroenterology evaluation.  3.  Suspected perianal abscess versus developing rectocutaneous fistula as above. This could be further evaluated with outpatient MRI perianal fistula protocol following medical management.  4.  Stigmata of liver  cirrhosis.   Patient was called with the results and advised to present to the ED for further evaluation.  Patient states that she is unable to have an MRI due to her spinal cord stimulator.  ED Course  Labs/Imaging on admission: I have personally reviewed following labs and imaging studies.  Vitals showed BP 107/64, pulse 72, RR 16, temp 98.9 F, SpO2 100% on room air.  Labs showed WBC 6.3, hemoglobin 13.4, platelets 126,000, lipase 24, sodium 131, potassium 3.3, bicarb 21, BUN 11, creatinine 1.11, serum glucose 221, AST 33, ALT 28, alk phos 125, total bilirubin 0.8.  Patient was given IV Zosyn .  EDP spoke with general surgery, Dr. Belinda, who felt that patient is not an operative candidate.  They recommended medical admission with IR for nonemergent drain placement, hold Eliquis , continue IV antibiotics, n.p.o. after midnight.  The hospitalist service was consulted to admit.  Review of Systems: All systems reviewed and are negative except as documented in history of present illness above.   Past Medical History:  Diagnosis Date   Alcohol abuse    a. quit several years ago.   Allergy    Anemia    younger   Arthritis    feet,knees   B12 deficiency    BPPV (benign paroxysmal positional vertigo)    Cirrhosis (HCC)    Complication of anesthesia    slow to wake   Constipation    on movantik - stools regular and soft on this   COPD (chronic obstructive pulmonary disease) (HCC)    Foot drop    GERD (gastroesophageal reflux disease)    Hyperlipidemia    Hypothyroidism    Neuromuscular disorder (HCC)    idiopathic neuropathy   Presence of permanent cardiac  pacemaker 08/05/2018   pacemaker insertion for tachy/brady syndrome   Spinal cord stimulator status    Spinal stenosis    Tobacco abuse     Past Surgical History:  Procedure Laterality Date   ANKLE SURGERY     ATRIAL FIBRILLATION ABLATION N/A 10/02/2020   Procedure: ATRIAL FIBRILLATION ABLATION;  Surgeon: Inocencio Soyla Lunger, MD;  Location: [facility];  Service: Cardiovascular;  Laterality: N/A;   CERVICAL CONE BIOPSY     CESAREAN SECTION     COLONOSCOPY     HERNIA REPAIR     KNEE SURGERY     x5   ORIF ANKLE FRACTURE Left 06/02/2018   Procedure: OPEN REDUCTION INTERNAL FIXATION (ORIF) ANKLE FRACTURE;  Surgeon: Kit Rush, MD;  Location: [facility];  Service: Orthopedics;  Laterality: Left;   ORIF ANKLE FRACTURE Left 08/26/2018   Procedure: Left ankle syndesmosis OPEN REDUCTION INTERNAL FIXATION (ORIF)/ reconstruction of deltoid ligament;  Surgeon: Kit Rush, MD;  Location:  SURGERY CENTER;  Service: Orthopedics;  Laterality: Left;   PACEMAKER IMPLANT N/A 08/05/2018   Procedure: PACEMAKER IMPLANT;  Surgeon: Inocencio Soyla Lunger, MD;  Location: [facility];  Service: Cardiovascular;  Laterality: N/A;   POLYPECTOMY     SPINAL CORD STIMULATOR INSERTION N/A 05/07/2016   Procedure: LUMBAR SPINAL CORD STIMULATOR INSERTION;  Surgeon: Donaciano Sprang, MD;  Location: [facility];  Service: Orthopedics;  Laterality: N/A;    Social History:  reports that she has been smoking cigarettes. She has a 30 pack-year smoking history. She has never used smokeless tobacco. She reports that she does not currently use alcohol. She reports that she does not use drugs.  Allergies  Allergen Reactions   Ambrosia Artemisiifolia (Ragweed) Skin Test Cough   Short Ragweed Pollen Ext Other (See Comments) and Cough    Eye Redness, too    Sulfa Antibiotics Hives, Rash and Other (See Comments)    GI Upset, also    Vancomycin Hives   Teicoplanin Other (See Comments)    Trade name(s): Targocid and Ticocin = GI Upset   Azithromycin Rash    Family History  Adopted: Yes  Family history unknown: Yes     Prior to Admission medications   Medication Sig Start Date End Date Taking? Authorizing Provider  ADVAIR DISKUS 250-50 MCG/ACT AEPB Inhale 1 puff into the lungs 2 (two) times daily. Patient not taking: Reported  on 08/31/2023 09/15/22   [provider]  albuterol  (PROVENTIL  HFA;VENTOLIN  HFA) 108 (90 Base) MCG/ACT inhaler Inhale 2 puffs into the lungs every 6 (six) hours as needed for wheezing or shortness of breath.  05/14/18   [provider]  B Complex Vitamins (B COMPLEX PO) Take 1 tablet by mouth daily.    [provider]  BREO ELLIPTA  200-25 MCG/INH AEPB Inhale 1 puff into the lungs daily. 05/17/18   [provider]  Coenzyme Q10 (CO Q 10) 100 MG CAPS Take 100 mg by mouth at bedtime.    [provider]  ELIQUIS  5 MG TABS tablet TAKE 1 TABLET BY MOUTH TWICE A DAY 09/16/23   Delford Maude BROCKS, MD  famotidine  (PEPCID ) 20 MG tablet Take 20 mg by mouth 2 (two) times daily. New Zantac 360T 05/26/18   [provider]  fluticasone  (FLONASE ) 50 MCG/ACT nasal spray Place 2 sprays into both nostrils as needed.    [provider]  folic acid  (FOLVITE ) 1 MG tablet Take 1 mg by mouth daily. 04/01/16   [provider]  HYDROmorphone  (DILAUDID ) 4 MG tablet Take 4 mg by mouth 5 (five) times daily as needed for severe pain. Patient not taking: Reported on 08/31/2023 03/03/20   [provider]  HYDROmorphone  (DILAUDID ) 4 MG tablet Take 1 tablet by mouth every 4-6 hours as needed. 10/30/23     levothyroxine  (SYNTHROID ) 25 MCG tablet Take 25 mcg by mouth daily before breakfast.  03/31/16   [provider]  losartan -hydrochlorothiazide  (HYZAAR) 50-12.5 MG tablet Take 1 tablet by mouth daily. 07/28/23   Nishan, Peter C, MD  magnesium  gluconate (MAGONATE) 500 MG tablet Take 500 mg by mouth at bedtime.    [provider]  metaxalone  (SKELAXIN ) 800 MG tablet Take 800 mg by mouth 3 (three) times daily. 02/20/20   [provider]  metoprolol  succinate (TOPROL -XL) 100 MG 24 hr tablet Take 1 tablet (100 mg total) by mouth daily. Take with or immediately following a meal. Take with 50 mg tab to total 150 mg daily. 11/20/22   Camnitz, Soyla Lunger, MD  metoprolol  succinate (TOPROL -XL) 50 MG 24 hr tablet TAKE 1 TABLET DAILY WITH OR IMMEDIATELY FOLLOWING A MEAL. TAKE WITH 100 MG TAB TO TOTAL 150 MG DAILY 02/06/23   Camnitz, Soyla Lunger, MD  naloxone  (NARCAN ) nasal spray 4 mg/0.1 mL Place 1 spray into the nose as directed. 09/02/22   [provider]  naloxone  (NARCAN ) nasal spray 4 mg/0.1 mL Take 1 spray into nostril(s) every 3 minutes until patient awakes or EMS arrives. 10/30/23     Olopatadine  HCl (PATADAY ) 0.7 % SOLN Place 1 drop into both eyes daily. 12/29/19   [provider]  oxyCODONE -acetaminophen  (PERCOCET) 10-325 MG tablet Take 1 tablet by mouth every 8 (eight) hours as needed. 08/14/23   [provider]  potassium chloride  (KLOR-CON ) 10 MEQ tablet Take 2 tablets (20 mEq total) by mouth daily. 03/13/23   Lesia Ozell Barter, PA-C  pregabalin  (LYRICA ) 150 MG capsule Take 150 mg by mouth 2 (two) times daily. Additional 150 mg in the middle of the day if needed 11/15/15   [provider]  PROCTOFOAM [facility] rectal foam Place 1 applicator rectally 3 (three) times daily.    [provider]  RESTASIS  MULTIDOSE 0.05 % ophthalmic emulsion Place 1 drop into both eyes every evening. 03/12/20   [provider]  Rosuvastatin  Calcium  20 MG CPSP Take 20 mg by mouth daily.    [provider]  senna (SENOKOT) 8.6 MG TABS tablet Take 2 tablets by mouth at bedtime.    [provider]  spironolactone  (ALDACTONE ) 25 MG tablet Take 1 tablet (25 mg total) by mouth daily. 11/14/22   Lesia Ozell Barter, PA-C  tiZANidine  (ZANAFLEX ) 4 MG tablet Take 8 mg by mouth at bedtime. 12/26/19   [provider]  Vitamin D , Ergocalciferol , (DRISDOL ) 50000 units CAPS capsule Take 50,000 Units by mouth every Wednesday.  03/16/16   [provider]    Physical Exam: Vitals:   11/10/23 2030 11/10/23 2100 11/10/23 2200 11/10/23 2230  BP: 132/79 110/78 113/78 113/67  Pulse:  74 73 73   Resp:  15 17 18   Temp:      TempSrc:      SpO2:  100% 100% 100%  Weight:      Height:       Constitutional: Resting in bed, NAD, calm, comfortable Eyes: EOMI, lids and conjunctivae normal ENMT: Mucous membranes are moist. Posterior pharynx clear of any exudate or lesions.Normal dentition.  Neck: normal, supple, no masses. Respiratory:  clear to auscultation bilaterally, no wheezing, no crackles. Normal respiratory effort. No accessory muscle use.  Cardiovascular: Regular rate and rhythm, no murmurs / rubs / gallops. No extremity edema. 2+ pedal pulses. Abdomen: no tenderness, no masses palpated.  Musculoskeletal: no clubbing / cyanosis. No joint deformity upper and lower extremities. Good ROM, no contractures. Normal muscle tone.  Skin: no rashes, lesions, ulcers. No induration Neurologic: Sensation intact. Strength 5/5 in all 4.  Psychiatric: Normal judgment and insight. Alert and oriented x 3. Normal mood.   EKG: Personally reviewed. Sinus rhythm, LBBB.  Rate 71.  Previous EKG showed atrial paced rhythm.  Assessment/Plan Principal Problem:   Acute perforated appendicitis Active Problems:   Cholelithiasis   Thrombocytopenia (HCC)   Paroxysmal atrial fibrillation (HCC)   Benign essential hypertension   Tobacco use   Chronic pain syndrome   Tachy-brady syndrome (HCC)   Cirrhosis (HCC)   Chronic heart failure with mildly reduced ejection fraction (HFmrEF, 41-49%) (HCC)   COPD (chronic obstructive pulmonary disease) (HCC)   Hypothyroidism   Abscess, periappendiceal   [name] is a 63 y.o. female with medical history significant for paroxysmal A-fib/flutter s/p ablation on Eliquis , HFmrEF, tachy-brady syndrome s/p PPM, cirrhosis, HTN, HLD, hypothyroidism, chronic back pain/lumbar stenosis s/p spinal cord stimulator on chronic narcotics, tobacco use who is admitted with acute perforated appendicitis with contained periappendiceal abscess.  Assessment and Plan: Acute  perforated appendicitis with contained periappendiceal abscess: Noted on outpatient CT report.  General surgery felt this is not an operative candidate and recommended IR drain placement.  Patient is afebrile, without leukocytosis, and hemodynamically stable. -Continue IV Zosyn  -General Surgery to follow -Gentle IV fluid hydration overnight -Holding Eliquis  -IR drain placement ordered  Cholelithiasis and suspected choledocholithiasis: Noted on outpatient CT report.  Patient does not have any RUQ abdominal pain.  LFTs are within normal limits.  She says her spinal cord stimulator is not MRI compatible. -Obtain RUQ ultrasound -Trend LFTs  Suspected perianal abscess versus developing rectocutaneous fistula: Noted on outpatient CT report.  Patient denies any anal/rectal pain.  She reports normal bowel movements.  She is on IV Zosyn  as above.  Paroxysmal atrial fibrillation/flutter s/p ablation Tachy-brady syndrome s/p PPM: Stable.  Continue Toprol -XL 150 mg daily.  Holding Eliquis .  HFmrEF: Stable, euvolemic on admission.  EF 45-50% by TTE 04/29/2023. -Continue Toprol -XL 150 mg daily -Continue spironolactone  25 mg daily -Continue losartan  50 mg daily  COPD: Stable without wheezing on admission.  Continue Wixela and albuterol  as needed.  Cirrhosis: Appears compensated without evidence of volume overload.  Patient does admit to ongoing moderate alcohol use, last drink 4 days ago.  Continue spironolactone .  Hypertension: Continue Toprol -XL, spironolactone , losartan .  Holding HCTZ for now.  Hypothyroidism: Continue Synthroid .  Hyperlipidemia: Continue rosuvastatin .  Thrombocytopenia: Mild in setting of cirrhosis.  No obvious bleeding.  Chronic back pain: S/p spinal cord stimulator.  Continue Lyrica  and Percocet prn.  Tobacco use: Patient reports smoking 1 pack/day.  Nicotine  patch provided.  Smoking cessation advised.   DVT prophylaxis: SCDs Start: 11/10/23 2017 Code Status:  Full code, confirmed with patient on admission Family Communication: Daughter at bedside Disposition Plan: From home and likely discharge to home pending clinical progress Consults called: General Surgery Severity of Illness: The appropriate patient status for this patient is INPATIENT. Inpatient status is judged to be reasonable and necessary in order to provide the required intensity of service to ensure the patient's safety. The patient's presenting symptoms, physical exam findings, and initial radiographic and  laboratory data in the context of their chronic comorbidities is felt to place them at high risk for further clinical deterioration. Furthermore, it is not anticipated that the patient will be medically stable for discharge from the hospital within 2 midnights of admission.   * I certify that at the point of admission it is my clinical judgment that the patient will require inpatient hospital care spanning beyond 2 midnights from the point of admission due to high intensity of service, high risk for further deterioration and high frequency of surveillance required.DEWAINE Jorie Blanch MD [facility]  If 7PM-7AM, please contact night-coverage [email or web address]  11/10/2023, 11:27 PM

## 2023-11-10 NOTE — Hospital Course (Addendum)
 Brief Narrative:  63 year old with history of paroxysmal A-fib status post ablation on Eliquis , CHF with reduced EF, tachybradycardia syndrome status post pacemaker, cirrhosis, HTN, HLD, hypothyroidism, chronic lower back pain status post spinal stimulator, chronic narcotic use, tobacco use admitted for acute perforated appendicitis and periappendiceal abscess.  Patient seen by general surgery and IR.  Had drainage catheter placed by IR on 2/5.   Assessment & Plan:  Principal Problem:   Acute perforated appendicitis Active Problems:   Cholelithiasis   Thrombocytopenia (HCC)   Paroxysmal atrial fibrillation (HCC)   Benign essential hypertension   Tobacco use   Chronic pain syndrome   Tachy-brady syndrome (HCC)   Cirrhosis (HCC)   Chronic heart failure with mildly reduced ejection fraction (HFmrEF, 41-49%) (HCC)   COPD (chronic obstructive pulmonary disease) (HCC)   Hypothyroidism   Abscess, periappendiceal   Acute perforated appendicitis with contained periappendiceal abscess: -Noted on outpatient CT scan in care everywhere.  There is concerns of acute perforated appendicitis with periappendiceal abscess.  Possible developing rectocutaneous fistula.  Seen by general surgery and IR.  Had drainage catheter placed on 2/5.  Continue Zosyn .  Cultures are growing multiple organisms, identification and susceptibility are pending.   Cholelithiasis without evidence of cholecystitis -Ultrasound performed.  Unfortunately spinal stimulator is not compatible with MRI.  FTs are normal and asymptomatic.  Clinically monitor  Soft blood pressure History of essential hypertension - Combination of multiple medications.  Will discontinue home Coreg, Aldactone , losartan  and HCTZ for now.  Will give very gentle hydration, midodrine if needed    Paroxysmal atrial fibrillation/flutter s/p ablation Tachy-brady syndrome s/p PPM: Stable.  Continue Toprol -XL 150 mg daily.  Resume Eliquis  once cleared by IR IV  as needed   HFmrEF: EF 45% Stable, euvolemic on admission.  EF 45-50% by TTE 04/29/2023. -Continue Toprol , Aldactone  and losartan    COPD: As needed bronchodilators   Cirrhosis: History of alcohol use -Alcohol withdrawal protocol   Hypothyroidism: Continue Synthroid .   Hyperlipidemia: Continue rosuvastatin .   Thrombocytopenia: Mild in setting of cirrhosis.  No obvious bleeding.   Chronic back pain: S/p spinal cord stimulator.  Continue Lyrica  and Percocet prn.   Tobacco use: Patient reports smoking 1 pack/day.  Nicotine  patch provided.  Smoking cessation advised.   DVT prophylaxis: SCDs Start: 11/10/23 2017    Code Status: Full Code Family Communication:  called Recardo, went to vm.  Status is: Inpatient Remains inpatient appropriate because: Continue hospital stay for management of intra-abdominal symptoms    Subjective: When I walked in the room, continued to ask for pain medication.  I explained to her due to soft blood pressure we will reassess giving her pain medication and give it accordingly.  Overall she appears to be comfortable.  Examination:  General exam: Appears calm and comfortable  Respiratory system: Clear to auscultation. Respiratory effort normal. Cardiovascular system: S1 & S2 heard, RRR. No JVD, murmurs, rubs, gallops or clicks. No pedal edema. Gastrointestinal system: Abdomen is nondistended, soft and nontender. No organomegaly or masses felt. Normal bowel sounds heard. Central nervous system: Alert and oriented. No focal neurological deficits. Extremities: Symmetric 5 x 5 power. Skin: No rashes, lesions or ulcers Psychiatry: Judgement and insight appear normal. Mood & affect appropriate.

## 2023-11-10 NOTE — Progress Notes (Signed)
 Patient ID: [name], female   DOB: May 10, 1961, 63 y.o.   MRN: [id number]  Full surgery consult to follow in AM.  Acute appendicitis with periappendiceal abscess (4.9 cm) on CT scan performed in [facility] system. Cirrhosis - managed by [facility] physicians Eliquis    This is not an operative candidate.  Recommend admission by [facility] with consult to Interventional Radiology for drain placement (not emergent).  Hold Eliquis  IV antibiotics Clears tonight, NPO for procedure tomorrow.  Surgery team will follow with complete consult note tomorrow.  Donnice POUR. Belinda, MD, [facility]  General Surgery   11/10/2023 6:35 PM

## 2023-11-11 ENCOUNTER — Encounter (HOSPITAL_COMMUNITY): Payer: Self-pay | Admitting: Internal Medicine

## 2023-11-11 ENCOUNTER — Inpatient Hospital Stay (HOSPITAL_COMMUNITY): Payer: 59

## 2023-11-11 ENCOUNTER — Other Ambulatory Visit: Payer: Self-pay | Admitting: Student

## 2023-11-11 DIAGNOSIS — K3532 Acute appendicitis with perforation and localized peritonitis, without abscess: Secondary | ICD-10-CM | POA: Diagnosis not present

## 2023-11-11 LAB — CBC
HCT: 38.9 % (ref 36.0–46.0)
Hemoglobin: 12.5 g/dL (ref 12.0–15.0)
MCH: 33.2 pg (ref 26.0–34.0)
MCHC: 32.1 g/dL (ref 30.0–36.0)
MCV: 103.2 fL — ABNORMAL HIGH (ref 80.0–100.0)
Platelets: 124 10*3/uL — ABNORMAL LOW (ref 150–400)
RBC: 3.77 MIL/uL — ABNORMAL LOW (ref 3.87–5.11)
RDW: 14.6 % (ref 11.5–15.5)
WBC: 6 10*3/uL (ref 4.0–10.5)
nRBC: 0 % (ref 0.0–0.2)

## 2023-11-11 LAB — COMPREHENSIVE METABOLIC PANEL
ALT: 38 U/L (ref 0–44)
AST: 62 U/L — ABNORMAL HIGH (ref 15–41)
Albumin: 2.8 g/dL — ABNORMAL LOW (ref 3.5–5.0)
Alkaline Phosphatase: 132 U/L — ABNORMAL HIGH (ref 38–126)
Anion gap: 10 (ref 5–15)
BUN: 9 mg/dL (ref 8–23)
CO2: 25 mmol/L (ref 22–32)
Calcium: 9.3 mg/dL (ref 8.9–10.3)
Chloride: 98 mmol/L (ref 98–111)
Creatinine, Ser: 1 mg/dL (ref 0.44–1.00)
GFR, Estimated: >60 mL/min (ref >60)
Glucose, Bld: 214 mg/dL — ABNORMAL HIGH (ref 70–99)
Potassium: 4.4 mmol/L (ref 3.5–5.1)
Sodium: 133 mmol/L — ABNORMAL LOW (ref 135–145)
Total Bilirubin: 1 mg/dL (ref 0.0–1.2)
Total Protein: 6.8 g/dL (ref 6.5–8.1)

## 2023-11-11 LAB — MAGNESIUM: Magnesium: 2.3 mg/dL (ref 1.7–2.4)

## 2023-11-11 LAB — PROTIME-INR
INR: 1.4 — ABNORMAL HIGH (ref 0.8–1.2)
Prothrombin Time: 17.5 s — ABNORMAL HIGH (ref 11.4–15.2)

## 2023-11-11 LAB — HIV ANTIBODY (ROUTINE TESTING W REFLEX): HIV Screen 4th Generation wRfx: NONREACTIVE

## 2023-11-11 MED ORDER — FENTANYL CITRATE (PF) 100 MCG/2ML IJ SOLN
INTRAMUSCULAR | Status: AC
  Filled 2023-11-11: qty 2

## 2023-11-11 MED ORDER — HYDROMORPHONE HCL 4 MG PO TABS
4.0000 mg | ORAL_TABLET | ORAL | Status: DC
  Administered 2023-11-11 – 2023-11-14 (×14): 4 mg via ORAL
  Filled 2023-11-11: qty 2
  Filled 2023-11-11 (×14): qty 1

## 2023-11-11 MED ORDER — DIPHENHYDRAMINE HCL 50 MG/ML IJ SOLN
INTRAMUSCULAR | Status: AC | PRN
  Administered 2023-11-11: 25 mg via INTRAVENOUS

## 2023-11-11 MED ORDER — TIZANIDINE HCL 4 MG PO TABS
8.0000 mg | ORAL_TABLET | Freq: Every day | ORAL | Status: DC
  Administered 2023-11-11 – 2023-11-13 (×4): 8 mg via ORAL
  Filled 2023-11-11 (×4): qty 2

## 2023-11-11 MED ORDER — MIDAZOLAM HCL 2 MG/2ML IJ SOLN
INTRAMUSCULAR | Status: AC
  Filled 2023-11-11: qty 4

## 2023-11-11 MED ORDER — THIAMINE MONONITRATE 100 MG PO TABS
100.0000 mg | ORAL_TABLET | Freq: Every day | ORAL | Status: DC
  Administered 2023-11-11 – 2023-11-14 (×4): 100 mg via ORAL
  Filled 2023-11-11 (×4): qty 1

## 2023-11-11 MED ORDER — FENTANYL CITRATE (PF) 100 MCG/2ML IJ SOLN
INTRAMUSCULAR | Status: AC | PRN
  Administered 2023-11-11 (×2): 50 ug via INTRAVENOUS

## 2023-11-11 MED ORDER — SENNOSIDES-DOCUSATE SODIUM 8.6-50 MG PO TABS
1.0000 | ORAL_TABLET | Freq: Every evening | ORAL | Status: DC | PRN
  Administered 2023-11-12: 1 via ORAL
  Filled 2023-11-11: qty 1

## 2023-11-11 MED ORDER — SODIUM CHLORIDE 0.9% FLUSH
5.0000 mL | Freq: Three times a day (TID) | INTRAVENOUS | Status: DC
  Administered 2023-11-11 – 2023-11-14 (×9): 5 mL

## 2023-11-11 MED ORDER — DIPHENHYDRAMINE HCL 50 MG/ML IJ SOLN
INTRAMUSCULAR | Status: AC
  Filled 2023-11-11: qty 1

## 2023-11-11 MED ORDER — MIDAZOLAM HCL 2 MG/2ML IJ SOLN
INTRAMUSCULAR | Status: AC | PRN
  Administered 2023-11-11 (×3): 1 mg via INTRAVENOUS

## 2023-11-11 MED ORDER — LORAZEPAM 1 MG PO TABS: 1.0000 mg | ORAL_TABLET | ORAL | Status: AC | PRN

## 2023-11-11 MED ORDER — LORAZEPAM 2 MG/ML IJ SOLN
1.0000 mg | INTRAMUSCULAR | Status: AC | PRN
Start: 2023-11-11 — End: 2023-11-14

## 2023-11-11 MED ORDER — FOLIC ACID 1 MG PO TABS: 1.0000 mg | ORAL_TABLET | Freq: Every day | ORAL | Status: DC

## 2023-11-11 MED ORDER — THIAMINE HCL 100 MG/ML IJ SOLN: 100.0000 mg | Freq: Every day | INTRAMUSCULAR | Status: DC

## 2023-11-11 MED ORDER — METAXALONE 800 MG PO TABS: 800.0000 mg | ORAL_TABLET | Freq: Two times a day (BID) | ORAL | Status: DC

## 2023-11-11 MED ORDER — CHLORDIAZEPOXIDE HCL 25 MG PO CAPS
25.0000 mg | ORAL_CAPSULE | Freq: Three times a day (TID) | ORAL | Status: AC
  Administered 2023-11-11 (×3): 25 mg via ORAL
  Filled 2023-11-11 (×3): qty 1

## 2023-11-11 MED ORDER — HYDRALAZINE HCL 20 MG/ML IJ SOLN: 10.0000 mg | INTRAMUSCULAR | Status: DC | PRN

## 2023-11-11 MED ORDER — GUAIFENESIN 100 MG/5ML PO LIQD: 5.0000 mL | ORAL | Status: DC | PRN

## 2023-11-11 MED ORDER — MIDAZOLAM HCL 2 MG/2ML IJ SOLN
INTRAMUSCULAR | Status: AC
  Filled 2023-11-11: qty 2

## 2023-11-11 MED ORDER — IPRATROPIUM-ALBUTEROL 0.5-2.5 (3) MG/3ML IN SOLN: 3.0000 mL | RESPIRATORY_TRACT | Status: DC | PRN

## 2023-11-11 MED ORDER — CHLORDIAZEPOXIDE HCL 25 MG PO CAPS
25.0000 mg | ORAL_CAPSULE | Freq: Two times a day (BID) | ORAL | Status: AC
  Administered 2023-11-12 (×2): 25 mg via ORAL
  Filled 2023-11-11 (×2): qty 1

## 2023-11-11 MED ORDER — CHLORDIAZEPOXIDE HCL 25 MG PO CAPS
25.0000 mg | ORAL_CAPSULE | Freq: Every day | ORAL | Status: AC
  Administered 2023-11-13: 25 mg via ORAL
  Filled 2023-11-11: qty 1

## 2023-11-11 MED ORDER — FAMOTIDINE 20 MG PO TABS
20.0000 mg | ORAL_TABLET | Freq: Two times a day (BID) | ORAL | Status: DC
  Administered 2023-11-11 – 2023-11-14 (×7): 20 mg via ORAL
  Filled 2023-11-11 (×7): qty 1

## 2023-11-11 MED ORDER — DEXTROSE-SODIUM CHLORIDE 5-0.45 % IV SOLN: INTRAVENOUS | Status: AC

## 2023-11-11 MED ORDER — ADULT MULTIVITAMIN W/MINERALS CH
1.0000 | ORAL_TABLET | Freq: Every day | ORAL | Status: DC
  Administered 2023-11-11 – 2023-11-14 (×4): 1 via ORAL
  Filled 2023-11-11 (×4): qty 1

## 2023-11-11 MED ORDER — METOPROLOL TARTRATE 5 MG/5ML IV SOLN: 5.0000 mg | INTRAVENOUS | Status: DC | PRN

## 2023-11-11 MED ORDER — FOLIC ACID 1 MG PO TABS
1.0000 mg | ORAL_TABLET | Freq: Every day | ORAL | Status: DC
  Administered 2023-11-11 – 2023-11-14 (×4): 1 mg via ORAL
  Filled 2023-11-11 (×4): qty 1

## 2023-11-11 NOTE — Progress Notes (Signed)
 PROGRESS NOTE    [name]  FMW:[id number] DOB: 06-03-1961 DOA: 11/10/2023 PCP: Debrah Josette ORN., PA-C    Brief Narrative:  63 year old with history of paroxysmal A-fib status post ablation on Eliquis , CHF with reduced EF, tachybradycardia syndrome status post pacemaker, cirrhosis, HTN, HLD, hypothyroidism, chronic lower back pain status post spinal stimulator, chronic narcotic use, tobacco use admitted for acute perforated appendicitis and periappendiceal abscess.  General surgery is following.   Assessment & Plan:  Principal Problem:   Acute perforated appendicitis Active Problems:   Cholelithiasis   Thrombocytopenia (HCC)   Paroxysmal atrial fibrillation (HCC)   Benign essential hypertension   Tobacco use   Chronic pain syndrome   Tachy-brady syndrome (HCC)   Cirrhosis (HCC)   Chronic heart failure with mildly reduced ejection fraction (HFmrEF, 41-49%) (HCC)   COPD (chronic obstructive pulmonary disease) (HCC)   Hypothyroidism   Abscess, periappendiceal   Acute perforated appendicitis with contained periappendiceal abscess: -Noted on outpatient CT scan in care everywhere.  There is concerns of acute perforated appendicitis with periappendiceal abscess.  Possible developing rectocutaneous fistula.  Seen by general surgery, IR consulted.  No empiric IV Zosyn .  Eliquis  is on hold.  Gentle hydration   Cholelithiasis without evidence of cholecystitis -Ultrasound performed.  Unfortunately spinal stimulator is not compatible with MRI.  LFTs are normal therefore we will monitor for now.    Paroxysmal atrial fibrillation/flutter s/p ablation Tachy-brady syndrome s/p PPM: Stable.  Continue Toprol -XL 150 mg daily.  Holding Eliquis . IV as needed   HFmrEF: EF 45% Stable, euvolemic on admission.  EF 45-50% by TTE 04/29/2023. -Continue Toprol , Aldactone  and losartan    COPD: As needed bronchodilators   Cirrhosis: History of alcohol use -Continue home Aldactone .  Alcohol  withdrawal protocol   Hypertension: Continue Toprol -XL, spironolactone , losartan .  Holding HCTZ for now. IV as needed   Hypothyroidism: Continue Synthroid .   Hyperlipidemia: Continue rosuvastatin .   Thrombocytopenia: Mild in setting of cirrhosis.  No obvious bleeding.   Chronic back pain: S/p spinal cord stimulator.  Continue Lyrica  and Percocet prn.   Tobacco use: Patient reports smoking 1 pack/day.  Nicotine  patch provided.  Smoking cessation advised.   DVT prophylaxis: SCDs Start: 11/10/23 2017    Code Status: Full Code Family Communication:   Status is: Inpatient Remains inpatient appropriate because: Continue hospital stay for management of intra-abdominal symptoms    Subjective:  No complaints besides lower abdominal pain.  Asking me to increase her pain medications to match her home regimen.  I have requested that I will confirm with pharmacy before adjusting medications.  Examination:  General exam: Appears calm and comfortable  Respiratory system: Clear to auscultation. Respiratory effort normal. Cardiovascular system: S1 & S2 heard, RRR. No JVD, murmurs, rubs, gallops or clicks. No pedal edema. Gastrointestinal system: Abdomen is nondistended, soft and nontender. No organomegaly or masses felt. Normal bowel sounds heard. Central nervous system: Alert and oriented. No focal neurological deficits. Extremities: Symmetric 5 x 5 power. Skin: No rashes, lesions or ulcers Psychiatry: Judgement and insight appear normal. Mood & affect appropriate.                Diet Orders (From admission, onward)     Start     Ordered   11/11/23 0916  Diet NPO time specified Except for: Sips with Meds  Diet effective midnight       Question:  Except for  Answer:  Sips with Meds   11/11/23 0915  Objective: Vitals:   11/11/23 0830 11/11/23 0850 11/11/23 0900 11/11/23 1020  BP: 106/70  101/73 91/75  Pulse: 64  62 61  Resp: 16  14 14   Temp:  97.9  F (36.6 C)    TempSrc:      SpO2: 99%  97% 97%  Weight:      Height:        Intake/Output Summary (Last 24 hours) at 11/11/2023 1105 Last data filed at 11/10/2023 2001 Gross per 24 hour  Intake 0 ml  Output 0 ml  Net 0 ml   Filed Weights   11/10/23 1710  Weight: 114.8 kg    Scheduled Meds:  chlordiazePOXIDE   25 mg Oral TID   Followed by   NOREEN ON 11/12/2023] chlordiazePOXIDE   25 mg Oral BID   Followed by   NOREEN ON 11/13/2023] chlordiazePOXIDE   25 mg Oral Q1500   cycloSPORINE   1 drop Both Eyes BID   folic acid   1 mg Oral Daily   levothyroxine   25 mcg Oral Q0600   losartan   50 mg Oral Daily   metoprolol  succinate  150 mg Oral QHS   mometasone -formoterol   2 puff Inhalation BID   multivitamin with minerals  1 tablet Oral Daily   nicotine   21 mg Transdermal Daily   potassium chloride   10 mEq Oral Daily   pregabalin   150 mg Oral BID   rosuvastatin   20 mg Oral Daily   senna  2 tablet Oral QHS   sodium chloride  flush  3 mL Intravenous Q12H   spironolactone   25 mg Oral Daily   thiamine   100 mg Oral Daily   Or   thiamine   100 mg Intravenous Daily   tiZANidine   8 mg Oral QHS   Continuous Infusions:  dextrose  5 % and 0.45 % NaCl     piperacillin -tazobactam (ZOSYN )  IV Stopped (11/11/23 1053)    Nutritional status     Body mass index is 37.37 kg/m.  Data Reviewed:   CBC: Recent Labs  Lab 11/10/23 1730 11/11/23 0630  WBC 6.3 6.0  NEUTROABS 4.2  --   HGB 13.4 12.5  HCT 40.9 38.9  MCV 101.2* 103.2*  PLT 126* 124*   Basic Metabolic Panel: Recent Labs  Lab 11/10/23 1730 11/11/23 0630  NA 131* 133*  K 3.3* 4.4  CL 96* 98  CO2 21* 25  GLUCOSE 221* 214*  BUN 11 9  CREATININE 1.11* 1.00  CALCIUM  9.3 9.3  MG  --  2.3   GFR: Estimated Creatinine Clearance: 78.8 mL/min (by C-G formula based on SCr of 1 mg/dL). Liver Function Tests: Recent Labs  Lab 11/10/23 1730 11/11/23 0630  AST 33 62*  ALT 28 38  ALKPHOS 125 132*  BILITOT 0.8 1.0  PROT 7.4 6.8   ALBUMIN 3.1* 2.8*   Recent Labs  Lab 11/10/23 1730  LIPASE 24   No results for input(s): AMMONIA in the last 168 hours. Coagulation Profile: Recent Labs  Lab 11/11/23 0802  INR 1.4*   Cardiac Enzymes: No results for input(s): CKTOTAL, CKMB, CKMBINDEX, TROPONINI in the last 168 hours. BNP (last 3 results) Recent Labs    07/22/23 1243 08/31/23 1149  PROBNP 331* 457*   HbA1C: No results for input(s): HGBA1C in the last 72 hours. CBG: No results for input(s): GLUCAP in the last 168 hours. Lipid Profile: No results for input(s): CHOL, HDL, LDLCALC, TRIG, CHOLHDL, LDLDIRECT in the last 72 hours. Thyroid Function Tests: No results for input(s): TSH, T4TOTAL, FREET4, T3FREE,  THYROIDAB in the last 72 hours. Anemia Panel: No results for input(s): VITAMINB12, FOLATE, FERRITIN, TIBC, IRON, RETICCTPCT in the last 72 hours. Sepsis Labs: No results for input(s): PROCALCITON, LATICACIDVEN in the last 168 hours.  No results found for this or any previous visit (from the past 240 hours).       Radiology Studies: US  Abdomen Limited RUQ (LIVER/GB) Result Date: 11/10/2023 CLINICAL DATA:  Cholelithiasis EXAM: ULTRASOUND ABDOMEN LIMITED RIGHT UPPER QUADRANT COMPARISON:  None Available. FINDINGS: Gallbladder: A negative sonographic Beverley sign was reported by the sonographer. Cholelithiasis measuring up to 1.7 cm. No pericholecystic fluid or gallbladder wall thickening. Common bile duct: Diameter: 7 mm Liver: Coarse hepatic echotexture with nodular contour. Portal vein is patent on color Doppler imaging with normal direction of blood flow towards the liver. Other: None. IMPRESSION: 1. Cholelithiasis without evidence of acute cholecystitis. 2. Cirrhosis. Electronically Signed   By: Franky Stanford M.D.   On: 11/10/2023 21:29           LOS: 1 day   Time spent= 35 mins    Burgess JAYSON Dare, MD [facility]  If 7PM-7AM, please  contact night-coverage  11/11/2023, 11:05 AM

## 2023-11-11 NOTE — Consult Note (Signed)
 [name] Feb 13, 1961  [id number].    Requesting MD: Dr. Jorie Blanch Chief Complaint/Reason for Consult: perforated appendicitis with abscess  HPI:  This is a pleasant 63 yo white female with multiple medical problems including a fib on Eliquis  (LD 2/4 in am), tachy-brady syndrome with PPM, chronic back pain with spinal stimulator, ETOH abuse with cirrhosis (appears mild currently), tobacco abuse, COPD, HLD, HTN, and hypothyroidism who began having some abdominal pain on Friday after eating a sandwich.  She thought she was having gas pains.  She developed nausea, but no emesis.  Low grade temp noted at her PCP on Tuesday, but prior to this some chills and sweats over the weekend.  She continued to have normal BMs with no blood present.  She toughed it out over the weekend and tried to see her PCP on Monday, but couldn't get an appt until Tuesday.  She was referred for a CT scan yesterday which revealed perforated appendicitis with an approximately 5cm abscess, possible choledocholithiasis, and possible perirectal abscess.  Her WBC is normal at 6, plts 124K, LFTs normal except AP 132/AST 62, Na 133, INR 1.4.  She underwent an US  here which reveals gallstones but no other evidence of choledocholithiasis.  Her CBD measures 7mm.  The patient denies any rectal pain or issues moving her bowels.  We have been asked to see her for further recommendations.  ROS: ROS: see HPI  Family History  Adopted: Yes  Family history unknown: Yes    Past Medical History:  Diagnosis Date   Alcohol abuse    Allergy    Anemia    younger   Arthritis    feet,knees   B12 deficiency    BPPV (benign paroxysmal positional vertigo)    Cirrhosis (HCC)    Complication of anesthesia    slow to wake   Constipation    on movantik - stools regular and soft on this   COPD (chronic obstructive pulmonary disease) (HCC)    Foot drop    GERD (gastroesophageal reflux disease)    Hyperlipidemia     Hypothyroidism    Neuromuscular disorder (HCC)    idiopathic neuropathy   Presence of permanent cardiac pacemaker 08/05/2018   pacemaker insertion for tachy/brady syndrome   Spinal cord stimulator status    Spinal stenosis    Tobacco abuse     Past Surgical History:  Procedure Laterality Date   ANKLE SURGERY     ATRIAL FIBRILLATION ABLATION N/A 10/02/2020   Procedure: ATRIAL FIBRILLATION ABLATION;  Surgeon: Inocencio Soyla Lunger, MD;  Location: [facility];  Service: Cardiovascular;  Laterality: N/A;   CERVICAL CONE BIOPSY     CESAREAN SECTION     COLONOSCOPY     HERNIA REPAIR     has mesh, open transverse abdominal incision   KNEE SURGERY     x5   ORIF ANKLE FRACTURE Left 06/02/2018   Procedure: OPEN REDUCTION INTERNAL FIXATION (ORIF) ANKLE FRACTURE;  Surgeon: Kit Rush, MD;  Location: [facility];  Service: Orthopedics;  Laterality: Left;   ORIF ANKLE FRACTURE Left 08/26/2018   Procedure: Left ankle syndesmosis OPEN REDUCTION INTERNAL FIXATION (ORIF)/ reconstruction of deltoid ligament;  Surgeon: Kit Rush, MD;  Location: [facility];  Service: Orthopedics;  Laterality: Left;   PACEMAKER IMPLANT N/A 08/05/2018   Procedure: PACEMAKER IMPLANT;  Surgeon: Inocencio Soyla Lunger, MD;  Location: [facility];  Service: Cardiovascular;  Laterality: N/A;   POLYPECTOMY     SPINAL  CORD STIMULATOR INSERTION N/A 05/07/2016   Procedure: LUMBAR SPINAL CORD STIMULATOR INSERTION;  Surgeon: Donaciano Sprang, MD;  Location: [facility];  Service: Orthopedics;  Laterality: N/A;    Social History:  reports that she has been smoking cigarettes. She has a 30 pack-year smoking history. She has never used smokeless tobacco. She reports current alcohol use. She reports that she does not use drugs.  Allergies:  Allergies  Allergen Reactions   Ambrosia Artemisiifolia (Ragweed) Skin Test Cough   Short Ragweed Pollen Ext Other (See Comments) and Cough    Eye Redness, too    Sulfa  Antibiotics Hives, Rash and Other (See Comments)    GI Upset, also    Vancomycin Hives   Teicoplanin Other (See Comments)    Trade name(s): Targocid and Ticocin = GI Upset   Azithromycin Rash    (Not in a hospital admission)    Physical Exam: Blood pressure 106/70, pulse 64, temperature 97.9 F (36.6 C), resp. rate 16, height 5' 9 (1.753 m), weight 114.8 kg, SpO2 99%. General: pleasant, WD, obese white female who is laying in bed in NAD HEENT: head is normocephalic, atraumatic.  Sclera are noninjected.  PERRL.  Ears and nose without any masses or lesions.  Mouth is pink and dry Heart: regular, rate, and rhythm, pacemaker present.  Normal s1,s2. No obvious murmurs, gallops, or rubs noted.   Lungs: CTAB, no wheezes, rhonchi, or rales noted.  Respiratory effort nonlabored Abd: soft, appropriately tender on right side of abdomen, specifically greatest in RLQ, no rebounding or peritonitis, ND/obese, +BS, no masses, hernias, or organomegaly/  transverse abdominal scar from previous ventral hernia repair. Rectal: no pain at her rectum, no fluctuance noted, cellulitis, or evidence of underlying infection Psych: A&Ox3 with an appropriate affect.   Results for orders placed or performed during the hospital encounter of 11/10/23 (from the past 48 hours)  CBC with Differential     Status: Abnormal   Collection Time: 11/10/23  5:30 PM  Result Value Ref Range   WBC 6.3 4.0 - 10.5 K/uL   RBC 4.04 3.87 - 5.11 MIL/uL   Hemoglobin 13.4 12.0 - 15.0 g/dL   HCT 59.0 63.9 - 53.9 %   MCV 101.2 (H) 80.0 - 100.0 fL   MCH 33.2 26.0 - 34.0 pg   MCHC 32.8 30.0 - 36.0 g/dL   RDW 85.6 88.4 - 84.4 %   Platelets 126 (L) 150 - 400 K/uL   nRBC 0.0 0.0 - 0.2 %   Neutrophils Relative % 67 %   Neutro Abs 4.2 1.7 - 7.7 K/uL   Lymphocytes Relative 19 %   Lymphs Abs 1.2 0.7 - 4.0 K/uL   Monocytes Relative 12 %   Monocytes Absolute 0.8 0.1 - 1.0 K/uL   Eosinophils Relative 1 %   Eosinophils Absolute 0.1 0.0 -  0.5 K/uL   Basophils Relative 0 %   Basophils Absolute 0.0 0.0 - 0.1 K/uL   Immature Granulocytes 1 %   Abs Immature Granulocytes 0.03 0.00 - 0.07 K/uL    Comment: Performed at [facility], [address], [place], [place] [address]  Comprehensive metabolic panel     Status: Abnormal   Collection Time: 11/10/23  5:30 PM  Result Value Ref Range   Sodium 131 (L) 135 - 145 mmol/L   Potassium 3.3 (L) 3.5 - 5.1 mmol/L   Chloride 96 (L) 98 - 111 mmol/L   CO2 21 (L) 22 - 32 mmol/L   Glucose, Bld  221 (H) 70 - 99 mg/dL    Comment: Glucose reference range applies only to samples taken after fasting for at least 8 hours.   BUN 11 8 - 23 mg/dL   Creatinine, Ser 8.88 (H) 0.44 - 1.00 mg/dL   Calcium  9.3 8.9 - 10.3 mg/dL   Total Protein 7.4 6.5 - 8.1 g/dL   Albumin 3.1 (L) 3.5 - 5.0 g/dL   AST 33 15 - 41 U/L   ALT 28 0 - 44 U/L   Alkaline Phosphatase 125 38 - 126 U/L   Total Bilirubin 0.8 0.0 - 1.2 mg/dL   GFR, Estimated 56 (L) >60 mL/min    Comment: (NOTE) Calculated using the CKD-EPI Creatinine Equation (2021)    Anion gap 14 5 - 15    Comment: Performed at [facility], [address], [place], [place] [address]  Lipase, blood     Status: None   Collection Time: 11/10/23  5:30 PM  Result Value Ref Range   Lipase 24 11 - 51 U/L    Comment: Performed at [facility], [address] [place], [place] [address]  Magnesium      Status: None   Collection Time: 11/11/23  6:30 AM  Result Value Ref Range   Magnesium  2.3 1.7 - 2.4 mg/dL    Comment: Performed at [facility], [address] [place], [place] [address]  Comprehensive metabolic panel     Status: Abnormal   Collection Time: 11/11/23  6:30 AM  Result Value Ref Range   Sodium 133 (L) 135 - 145 mmol/L   Potassium 4.4 3.5 - 5.1 mmol/L   Chloride 98 98 - 111 mmol/L   CO2 25 22 - 32 mmol/L   Glucose, Bld 214 (H) 70 - 99 mg/dL    Comment: Glucose reference  range applies only to samples taken after fasting for at least 8 hours.   BUN 9 8 - 23 mg/dL   Creatinine, Ser 8.99 0.44 - 1.00 mg/dL   Calcium  9.3 8.9 - 10.3 mg/dL   Total Protein 6.8 6.5 - 8.1 g/dL   Albumin 2.8 (L) 3.5 - 5.0 g/dL   AST 62 (H) 15 - 41 U/L   ALT 38 0 - 44 U/L   Alkaline Phosphatase 132 (H) 38 - 126 U/L   Total Bilirubin 1.0 0.0 - 1.2 mg/dL   GFR, Estimated >39 >39 mL/min    Comment: (NOTE) Calculated using the CKD-EPI Creatinine Equation (2021)    Anion gap 10 5 - 15    Comment: Performed at [facility], [address] [place], [place] [address]  CBC     Status: Abnormal   Collection Time: 11/11/23  6:30 AM  Result Value Ref Range   WBC 6.0 4.0 - 10.5 K/uL   RBC 3.77 (L) 3.87 - 5.11 MIL/uL   Hemoglobin 12.5 12.0 - 15.0 g/dL   HCT 61.0 63.9 - 53.9 %   MCV 103.2 (H) 80.0 - 100.0 fL   MCH 33.2 26.0 - 34.0 pg   MCHC 32.1 30.0 - 36.0 g/dL   RDW 85.3 88.4 - 84.4 %   Platelets 124 (L) 150 - 400 K/uL   nRBC 0.0 0.0 - 0.2 %    Comment: Performed at [facility], [address], [place], [place] [address]  Protime-INR     Status: Abnormal   Collection Time: 11/11/23  8:02 AM  Result Value Ref Range   Prothrombin Time 17.5 (H)  11.4 - 15.2 seconds   INR 1.4 (H) 0.8 - 1.2    Comment: (NOTE) INR goal varies based on device and disease states. Performed at [facility], [address], [place], [place] [address]    US  Abdomen Limited RUQ (LIVER/GB) Result Date: 11/10/2023 CLINICAL DATA:  Cholelithiasis EXAM: ULTRASOUND ABDOMEN LIMITED RIGHT UPPER QUADRANT COMPARISON:  None Available. FINDINGS: Gallbladder: A negative sonographic Beverley sign was reported by the sonographer. Cholelithiasis measuring up to 1.7 cm. No pericholecystic fluid or gallbladder wall thickening. Common bile duct: Diameter: 7 mm Liver: Coarse hepatic echotexture with nodular contour. Portal vein is patent on color Doppler imaging with normal  direction of blood flow towards the liver. Other: None. IMPRESSION: 1. Cholelithiasis without evidence of acute cholecystitis. 2. Cirrhosis. Electronically Signed   By: Franky Stanford M.D.   On: 11/10/2023 21:29      Assessment/Plan Perforated appendicitis with abscess The patient has been seen, examined, labs, chart, vitals, and imaging report in care everywhere personally reviewed.  She appears to have perforated appendicitis with an abscess.  We would recommend IR evaluation for drain placement to avoid acute surgical intervention.  She would be higher risk for an open operation or more complex operation such as partial colectomy in the acute setting of perforation and abscess.  We will therefore attempt conservative management.  She also has cirrhosis, which currently appears likely mild, but this would also still increase her surgical risks to some extent as well.  We discussed if she resolves with a drain, that we would evaluate her for an interval appendectomy in 6-8 weeks, but if all symptoms have resolved, could discuss monitoring and holding on surgery with her multiple medical issues, including her cirrhosis and risks with an intra-abdominal operation.  However, we also discussed that if she were to fail conservative management while here, that she still may yet require acute surgical intervention.  I have discussed all of this with the patient and her daughter, who is at bedside.  They both understand and agree with this plan.  All questions are answered.  I think she is ok to have a CLD pending IR plans for drain today vs tomorrow given her Eliquis  and ability to place a drain.  Agree with abx therapy.   FEN - NPO until seen by IR VTE - Eliquis  on hold, may have chemical prophylaxis from our standpoint.  May start heparin  gtt if needed ID - zosyn   Cholelithiasis - no evidence of choledocholithiasis on US .  LFTs are essentially normal with no signs of obstructive process.  She is not having  biliary colic type symptoms.  She has known about her gallstones for a while now. Perirectal inflammation - no clinical signs of abscess and patient has no perirectal pain.  Unable to view imaging.  Cont to monitor.  If there is something starting to form, the zosyn  she is on for her appendix should help this anyway. ETOH abuse - would recommend CIWA Cirrhosis Tobacco abuse Tachy-brady syndrome - PPM Paroxsymal A fib - Eliquis  on hold, can do hep gtt if needed when ok with IR HTN HLD Hypothyroidism Chronic back pain with stimulator on chronic narcotics  I reviewed nursing notes, ED provider notes, hospitalist notes, last 24 h vitals and pain scores, last 48 h intake and output, last 24 h labs and trends, and last 24 h imaging results.  Burnard FORBES Banter, [facility] 11/11/2023, 9:00 AM Please see Amion for pager number during day  hours 7:00am-4:30pm or 7:00am -11:30am on weekends

## 2023-11-11 NOTE — Procedures (Signed)
 Vascular and Interventional Radiology Procedure Note  Patient: [name] DOB: [birth year] Medical Record Number: [id number] Note Date/Time: 11/11/23 2:46 PM   Performing Physician: Thom Hall, MD Assistant(s): None  Diagnosis: RLQ abscess  Procedure: DRAINAGE CATHETER PLACEMENT into a RIGHT LOWER QUADRANT ABSCESS  Anesthesia: Conscious Sedation Complications: None Estimated Blood Loss: Minimal Specimens: Sent for Gram Stain, Aerobe Culture, and Anerobe Culture  Findings:  Successful CT-guided placement of 8 F catheter into RLQ abscess.  Plan:  - Flush drain with 5 mL Normal Saline every 8 hours. - Follow up drain evaluation / sinogram in 10 day(s).  See detailed procedure note with images in PACS. The patient tolerated the procedure well without incident or complication and was returned to Recovery in stable condition.    Thom Hall, MD [facility]   Pager. [phone] Clinic. [phone]

## 2023-11-11 NOTE — Consult Note (Signed)
 Chief Complaint: Patient was seen in consultation today for perforated appendicitis with abscess  Referring Physician(s): Dr. Belinda  Supervising Physician: Karalee Beat  Patient Status: [facility] - In-pt  History of Present Illness: [name] is a 62 y.o. female with past medical history of EtOH cirrhosis, COPD, GERD, HLD, SSS s/p pacemaker, spinal cord stimulator presented to OSH/imaging center 2/4 with severe abdominal pain.  CT Abdomen Pelvis concerning for perforated appendicitis and she was referred to [facility] ED.  She has been assessed by general surgery who feels she is high risk for surgery and consulted IR for abdominal abscess drainage. Case reviewed by Dr. Karalee who approves patient for aspiration and drainage.   Patient assessed in the ED alongside spouse.  She continues to endorse abdominal pain.  Reviewed imaging findings.  She is understanding and agreeable to proceed.   Past Medical History:  Diagnosis Date   Alcohol abuse    Allergy    Anemia    younger   Arthritis    feet,knees   B12 deficiency    BPPV (benign paroxysmal positional vertigo)    Cirrhosis (HCC)    Complication of anesthesia    slow to wake   Constipation    on movantik - stools regular and soft on this   COPD (chronic obstructive pulmonary disease) (HCC)    Foot drop    GERD (gastroesophageal reflux disease)    Hyperlipidemia    Hypothyroidism    Neuromuscular disorder (HCC)    idiopathic neuropathy   Presence of permanent cardiac pacemaker 08/05/2018   pacemaker insertion for tachy/brady syndrome   Spinal cord stimulator status    Spinal stenosis    Tobacco abuse     Past Surgical History:  Procedure Laterality Date   ANKLE SURGERY     ATRIAL FIBRILLATION ABLATION N/A 10/02/2020   Procedure: ATRIAL FIBRILLATION ABLATION;  Surgeon: Inocencio Soyla Lunger, MD;  Location: [facility] IN[facility];  Service: Cardiovascular;  Laterality: N/A;   CERVICAL CONE BIOPSY     CESAREAN  SECTION     COLONOSCOPY     HERNIA REPAIR     has mesh, open transverse abdominal incision   KNEE SURGERY     x5   ORIF ANKLE FRACTURE Left 06/02/2018   Procedure: OPEN REDUCTION INTERNAL FIXATION (ORIF) ANKLE FRACTURE;  Surgeon: Kit Rush, MD;  Location: [facility] OR[facility];  Service: Orthopedics;  Laterality: Left;   ORIF ANKLE FRACTURE Left 08/26/2018   Procedure: Left ankle syndesmosis OPEN REDUCTION INTERNAL FIXATION (ORIF)/ reconstruction of deltoid ligament;  Surgeon: Kit Rush, MD;  Location: [facility];  Service: Orthopedics;  Laterality: Left;   PACEMAKER IMPLANT N/A 08/05/2018   Procedure: PACEMAKER IMPLANT;  Surgeon: Inocencio Soyla Lunger, MD;  Location: [facility];  Service: Cardiovascular;  Laterality: N/A;   POLYPECTOMY     SPINAL CORD STIMULATOR INSERTION N/A 05/07/2016   Procedure: LUMBAR SPINAL CORD STIMULATOR INSERTION;  Surgeon: Donaciano Sprang, MD;  Location: [facility] OR;  Service: Orthopedics;  Laterality: N/A;    Allergies: Ambrosia artemisiifolia (ragweed) skin test, Short ragweed pollen ext, Sulfa antibiotics, Vancomycin, Teicoplanin, and Azithromycin  Medications: Prior to Admission medications   Medication Sig Start Date End Date Taking? Authorizing Provider  albuterol  (PROVENTIL  HFA;VENTOLIN  HFA) 108 (90 Base) MCG/ACT inhaler Inhale 2 puffs into the lungs every 6 (six) hours as needed for wheezing or shortness of breath.  05/14/18  Yes [provider]  B Complex Vitamins (B COMPLEX PO) Take 1 tablet by mouth daily.  Yes [provider]  Coenzyme Q10 (CO Q 10) 100 MG CAPS Take 100 mg by mouth at bedtime.   Yes [provider]  ELIQUIS  5 MG TABS tablet TAKE 1 TABLET BY MOUTH TWICE A DAY 09/16/23  Yes Delford Maude BROCKS, MD  famotidine  (PEPCID ) 20 MG tablet Take 20 mg by mouth 2 (two) times daily. New Zantac 360T 05/26/18  Yes [provider]  fluticasone  (FLONASE ) 50 MCG/ACT nasal spray Place 2 sprays into both nostrils as  needed for allergies or rhinitis (or seasonal allergies).   Yes [provider]  folic acid  (FOLVITE ) 1 MG tablet Take 1 mg by mouth daily. 04/01/16  Yes [provider]  HYDROmorphone  (DILAUDID ) 4 MG tablet Take 1 tablet by mouth every 4-6 hours as needed. Patient taking differently: Take 4 mg by mouth See admin instructions. Take 4 mg by mouth every four hours 10/30/23  Yes   losartan -hydrochlorothiazide  (HYZAAR) 50-12.5 MG tablet Take 1 tablet by mouth daily. 07/28/23  Yes Nishan, Peter C, MD  magnesium  gluconate (MAGONATE) 500 MG tablet Take 500 mg by mouth at bedtime.   Yes [provider]  metaxalone  (SKELAXIN ) 800 MG tablet Take 800 mg by mouth 2 (two) times daily. 02/20/20  Yes [provider]  metoprolol  succinate (TOPROL -XL) 100 MG 24 hr tablet Take 1 tablet (100 mg total) by mouth daily. Take with or immediately following a meal. Take with 50 mg tab to total 150 mg daily. Patient taking differently: Take 100 mg by mouth See admin instructions. Take 100 mg by mouth at bedtime, in conjunction with one 50 mg tablet to equal a total dose of 150 mg 11/20/22  Yes Camnitz, Will Gladis, MD  metoprolol  succinate (TOPROL -XL) 50 MG 24 hr tablet TAKE 1 TABLET DAILY WITH OR IMMEDIATELY FOLLOWING A MEAL. TAKE WITH 100 MG TAB TO TOTAL 150 MG DAILY Patient taking differently: Take 50 mg by mouth See admin instructions. Take 50 mg by mouth at bedtime, in conjunction with one 100 mg tablet to equal a total dose of 150 mg 02/06/23  Yes Camnitz, Will Gladis, MD  naloxone  (NARCAN ) nasal spray 4 mg/0.1 mL Place 1 spray into the nose as directed. 09/02/22  Yes [provider]  nystatin powder Apply 1 Application topically 3 (three) times daily as needed (for heat rashes).   Yes [provider]  Olopatadine  HCl (PATADAY ) 0.7 % SOLN Place 1 drop into both eyes daily. 12/29/19  Yes [provider]  potassium chloride  (KLOR-CON ) 10 MEQ tablet Take 2 tablets (20  mEq total) by mouth daily. Patient taking differently: Take 10 mEq by mouth daily. 03/13/23  Yes Lesia Ozell Barter, PA-C  pregabalin  (LYRICA ) 150 MG capsule Take 150 mg by mouth See admin instructions. Take 150 mg by mouth two times a day and an additional 150 mg once day as needed for unresolved neuropathic pain 11/15/15  Yes [provider]  PROCTOFOAM [facility] rectal foam Place 1 applicator rectally 3 (three) times daily as needed for hemorrhoids or anal itching.   Yes [provider]  RESTASIS  MULTIDOSE 0.05 % ophthalmic emulsion Place 1 drop into both eyes in the morning and at bedtime. 03/12/20  Yes [provider]  rosuvastatin  (CRESTOR ) 20 MG tablet Take 20 mg by mouth daily.   Yes [provider]  senna (SENOKOT) 8.6 MG TABS tablet Take 2 tablets by mouth at bedtime.   Yes [provider]  spironolactone  (ALDACTONE ) 25 MG tablet Take 1 tablet (25  mg total) by mouth daily. 11/14/22  Yes Lesia Ozell Barter, PA-C  SYNTHROID  25 MCG tablet Take 25 mcg by mouth daily before breakfast.   Yes [provider]  tiZANidine  (ZANAFLEX ) 4 MG tablet Take 8 mg by mouth at bedtime. 12/26/19  Yes [provider]  TYLENOL  500 MG tablet Take 500-1,000 mg by mouth every 6 (six) hours as needed for mild pain (pain score 1-3) or headache.   Yes [provider]  Vitamin D , Ergocalciferol , (DRISDOL ) 50000 units CAPS capsule Take 50,000 Units by mouth every Wednesday.  03/16/16  Yes [provider]  NAPOLEON INHUB 250-50 MCG/ACT AEPB Inhale 1 puff into the lungs in the morning and at bedtime.   Yes [provider]  naloxone  (NARCAN ) nasal spray 4 mg/0.1 mL Take 1 spray into nostril(s) every 3 minutes until patient awakes or EMS arrives. Patient not taking: Reported on 11/10/2023 10/30/23        Family History  Adopted: Yes  Family history unknown: Yes    Social History   Socioeconomic History   Marital status: Widowed    Spouse  name: Not on file   Number of children: Not on file   Years of education: Not on file   Highest education level: Not on file  Occupational History   Not on file  Tobacco Use   Smoking status: Every Day    Current packs/day: 1.00    Average packs/day: 1 pack/day for 30.0 years (30.0 ttl pk-yrs)    Types: Cigarettes   Smokeless tobacco: Never  Vaping Use   Vaping status: Never Used  Substance and Sexual Activity   Alcohol use: Yes    Comment: goes through a pint of liquor about every 2 days   Drug use: No   Sexual activity: Yes    Birth control/protection: Post-menopausal  Other Topics Concern   Not on file  Social History Narrative   Not on file   Social Drivers of Health   Financial Resource Strain: Medium Risk (04/13/2023)   Received from [facility]   Overall Financial Resource Strain (CARDIA)    Difficulty of Paying Living Expenses: Somewhat hard  Food Insecurity: Low Risk  (11/10/2023)   Received from [facility]   Hunger Vital Sign    Worried About Running Out of Food in the Last Year: Never true    Ran Out of Food in the Last Year: Never true  Transportation Needs: No Transportation Needs (11/10/2023)   Received from [employer]    In the past 12 months, has lack of reliable transportation kept you from medical appointments, meetings, work or from getting things needed for daily living? : No  Physical Activity: Unknown (06/10/2022)   [facility] prior to 12/06/2022., [facility] visits prior to 12/06/2022.   Exercise Vital Sign    Days of Exercise per Week: 0 days    Minutes of Exercise per Session: Not on file  Stress: No Stress Concern Present (06/10/2022)   [facility] prior to 12/06/2022., [facility] visits prior to 12/06/2022.   [employer] of  Occupational Health - Occupational Stress Questionnaire    Feeling of Stress : Not at all  Social Connections: Unknown (06/10/2022)   [facility] prior to 12/06/2022., [facility]  [facility] visits prior to 12/06/2022.   Social Advertising Account Executive [NHANES]    Frequency of Communication with Friends and Family: Twice a week    Frequency of Social Gatherings with Friends and Family: Once a week    Attends Religious Services: Patient declined    Database Administrator or Organizations: No    Attends Engineer, Structural: Never    Marital Status: Married     Review of Systems: A 12 point ROS discussed and pertinent positives are indicated in the HPI above.  All other systems are negative.  Review of Systems  Constitutional:  Negative for fatigue and fever.  Respiratory:  Negative for cough and shortness of breath.   Cardiovascular:  Negative for chest pain.  Gastrointestinal:  Positive for abdominal pain. Negative for nausea and vomiting.  Musculoskeletal:  Positive for back pain.  Psychiatric/Behavioral:  Negative for behavioral problems and confusion.     Vital Signs: BP 91/75   Pulse 61   Temp 97.9 F (36.6 C)   Resp 14   Ht 5' 9 (1.753 m)   Wt 253 lb 1.4 oz (114.8 kg)   LMP  (LMP Unknown)   SpO2 97%   BMI 37.37 kg/m   Physical Exam Vitals and nursing note reviewed.  Constitutional:      General: She is not in acute distress.    Appearance: She is well-developed. She is not ill-appearing.  Pulmonary:     Effort: Pulmonary effort is normal. No respiratory distress.     Breath sounds: Normal breath sounds.  Abdominal:     General: Abdomen is flat.     Tenderness: There is generalized abdominal tenderness and tenderness in the right lower quadrant.  Skin:    General: Skin is warm and dry.  Neurological:     General: No focal deficit present.     Mental Status: She is  alert and oriented to person, place, and time.  Psychiatric:        Mood and Affect: Mood normal.        Behavior: Behavior normal.      MD Evaluation Airway: WNL Heart: WNL Abdomen: WNL Chest/ Lungs: WNL ASA  Classification: 3 Mallampati/Airway Score: Two   Imaging: US  Abdomen Limited RUQ (LIVER/GB) Result Date: 11/10/2023 CLINICAL DATA:  Cholelithiasis EXAM: ULTRASOUND ABDOMEN LIMITED RIGHT UPPER QUADRANT COMPARISON:  None Available. FINDINGS: Gallbladder: A negative sonographic Beverley sign was reported by the sonographer. Cholelithiasis measuring up to 1.7 cm. No pericholecystic fluid or gallbladder wall thickening. Common bile duct: Diameter: 7 mm Liver: Coarse hepatic echotexture with nodular contour. Portal vein is patent on color Doppler imaging with normal direction of blood flow towards the liver. Other: None. IMPRESSION: 1. Cholelithiasis without evidence of acute cholecystitis. 2. Cirrhosis. Electronically Signed   By: Franky Stanford M.D.   On: 11/10/2023 21:29   CUP PACEART REMOTE DEVICE CHECK Result Date: 11/09/2023 Scheduled remote reviewed. Normal device function.  1 AHR lasting 6 seconds. Next remote 91 days. - CS, CVRS   Labs:  CBC: Recent Labs    11/13/22 1159 11/10/23 1730 11/11/23 0630  WBC 6.5 6.3 6.0  HGB 16.1* 13.4 12.5  HCT 46.8* 40.9 38.9  PLT 158 126* 124*    COAGS: Recent Labs    11/11/23 0802  INR 1.4*    BMP: Recent Labs    07/22/23 1243 08/31/23 1149 11/10/23 1730 11/11/23 0630  NA 141 139 131* 133*  K 5.0 5.2 3.3* 4.4  CL 101 100 96* 98  CO2 21 21 21* 25  GLUCOSE 112* 173* 221* 214*  BUN 13 22 11 9   CALCIUM  10.0 10.1 9.3 9.3  CREATININE 1.15* 1.12* 1.11* 1.00  GFRNONAA  --   --  56* >60    LIVER FUNCTION TESTS: Recent Labs    11/10/23 1730 11/11/23 0630  BILITOT 0.8 1.0  AST 33 62*  ALT 28 38  ALKPHOS 125 132*  PROT 7.4 6.8  ALBUMIN 3.1* 2.8*    TUMOR MARKERS: No results for input(s): AFPTM, CEA, CA199,  CHROMGRNA in the last 8760 hours.  Assessment and Plan: Perforated appendicitis with abscess IR consulted for aspiration and drainage. Imaging reviewed by Dr. Karalee who approves patient for the procedure.  Discussed with patient and family at bedside.  They are understanding and agreeable to proceed.  NPO today.  INR 1.4.  Last dose Eliquis  yesterday.   Plan for drain placement today as schedule allows.   Risks and benefits discussed with the patient including bleeding, infection, damage to adjacent structures, bowel perforation/fistula connection, and sepsis.  All of the patient's questions were answered, patient is agreeable to proceed. Consent signed and in chart.   Thank you for this interesting consult.  I greatly enjoyed meeting [name] and look forward to participating in their care.  A copy of this report was sent to the requesting provider on this date.  Electronically Signed: Shauntavia Brackin Sue-Ellen Shanica Castellanos, PA 11/11/2023, 11:37 AM   I spent a total of 40 Minutes    in face to face in clinical consultation, greater than 50% of which was counseling/coordinating care for perforated appendicitis.

## 2023-11-12 DIAGNOSIS — K3532 Acute appendicitis with perforation and localized peritonitis, without abscess: Secondary | ICD-10-CM | POA: Diagnosis not present

## 2023-11-12 LAB — CBC
HCT: 35.1 % — ABNORMAL LOW (ref 36.0–46.0)
Hemoglobin: 11.4 g/dL — ABNORMAL LOW (ref 12.0–15.0)
MCH: 32.9 pg (ref 26.0–34.0)
MCHC: 32.5 g/dL (ref 30.0–36.0)
MCV: 101.4 fL — ABNORMAL HIGH (ref 80.0–100.0)
Platelets: 121 10*3/uL — ABNORMAL LOW (ref 150–400)
RBC: 3.46 MIL/uL — ABNORMAL LOW (ref 3.87–5.11)
RDW: 14.9 % (ref 11.5–15.5)
WBC: 5.9 10*3/uL (ref 4.0–10.5)
nRBC: 0 % (ref 0.0–0.2)

## 2023-11-12 LAB — BASIC METABOLIC PANEL
Anion gap: 11 (ref 5–15)
BUN: 9 mg/dL (ref 8–23)
CO2: 22 mmol/L (ref 22–32)
Calcium: 8.6 mg/dL — ABNORMAL LOW (ref 8.9–10.3)
Chloride: 97 mmol/L — ABNORMAL LOW (ref 98–111)
Creatinine, Ser: 1.29 mg/dL — ABNORMAL HIGH (ref 0.44–1.00)
GFR, Estimated: 47 mL/min — ABNORMAL LOW (ref >60)
Glucose, Bld: 207 mg/dL — ABNORMAL HIGH (ref 70–99)
Potassium: 4.1 mmol/L (ref 3.5–5.1)
Sodium: 130 mmol/L — ABNORMAL LOW (ref 135–145)

## 2023-11-12 LAB — MAGNESIUM: Magnesium: 2.1 mg/dL (ref 1.7–2.4)

## 2023-11-12 LAB — PHOSPHORUS: Phosphorus: 2.6 mg/dL (ref 2.5–4.6)

## 2023-11-12 MED ORDER — APIXABAN 5 MG PO TABS
5.0000 mg | ORAL_TABLET | Freq: Two times a day (BID) | ORAL | Status: DC
  Administered 2023-11-12 – 2023-11-14 (×5): 5 mg via ORAL
  Filled 2023-11-12 (×5): qty 1

## 2023-11-12 MED ORDER — SODIUM CHLORIDE 0.9 % IV BOLUS
250.0000 mL | Freq: Once | INTRAVENOUS | Status: AC
  Administered 2023-11-12: 250 mL via INTRAVENOUS

## 2023-11-12 MED ORDER — CARMEX CLASSIC LIP BALM EX OINT
TOPICAL_OINTMENT | CUTANEOUS | Status: DC | PRN
  Filled 2023-11-12: qty 10

## 2023-11-12 MED ORDER — SODIUM CHLORIDE 0.9 % IV BOLUS
1000.0000 mL | Freq: Once | INTRAVENOUS | Status: AC
  Administered 2023-11-12: 1000 mL via INTRAVENOUS

## 2023-11-12 MED ORDER — SODIUM CHLORIDE 0.9 % IV BOLUS: 250.0000 mL | Freq: Once | INTRAVENOUS | Status: DC

## 2023-11-12 NOTE — Progress Notes (Signed)
 Referring Physician(s): Dr. Belinda   Supervising Physician: Luverne Aran  Patient Status:  [facility] - In-pt  Chief Complaint:  perforated appendicitis  S/p RLQ drain placement by Dr. Hughes on 11/11/23   Subjective:  Laying in bed, NAD, states that she is feeling better than yesterday, soreness around the drain but no other complaints. RN flushed the drain w/o difficulty today, cause some pain.  No d.c plan yet.   Allergies: Ambrosia artemisiifolia (ragweed) skin test, Short ragweed pollen ext, Sulfa antibiotics, Vancomycin, Teicoplanin, and Azithromycin  Medications: Prior to Admission medications   Medication Sig Start Date End Date Taking? Authorizing Provider  albuterol  (PROVENTIL  HFA;VENTOLIN  HFA) 108 (90 Base) MCG/ACT inhaler Inhale 2 puffs into the lungs every 6 (six) hours as needed for wheezing or shortness of breath.  05/14/18  Yes [provider]  B Complex Vitamins (B COMPLEX PO) Take 1 tablet by mouth daily.   Yes [provider]  Coenzyme Q10 (CO Q 10) 100 MG CAPS Take 100 mg by mouth at bedtime.   Yes [provider]  ELIQUIS  5 MG TABS tablet TAKE 1 TABLET BY MOUTH TWICE A DAY 09/16/23  Yes Delford Maude BROCKS, MD  famotidine  (PEPCID ) 20 MG tablet Take 20 mg by mouth 2 (two) times daily. New Zantac 360T 05/26/18  Yes [provider]  fluticasone  (FLONASE ) 50 MCG/ACT nasal spray Place 2 sprays into both nostrils as needed for allergies or rhinitis (or seasonal allergies).   Yes [provider]  folic acid  (FOLVITE ) 1 MG tablet Take 1 mg by mouth daily. 04/01/16  Yes [provider]  HYDROmorphone  (DILAUDID ) 4 MG tablet Take 1 tablet by mouth every 4-6 hours as needed. Patient taking differently: Take 4 mg by mouth See admin instructions. Take 4 mg by mouth every four hours 10/30/23  Yes   losartan -hydrochlorothiazide  (HYZAAR) 50-12.5 MG tablet Take 1 tablet by mouth daily. 07/28/23  Yes Nishan, Peter C, MD  magnesium   gluconate (MAGONATE) 500 MG tablet Take 500 mg by mouth at bedtime.   Yes [provider]  metaxalone  (SKELAXIN ) 800 MG tablet Take 800 mg by mouth 2 (two) times daily. 02/20/20  Yes [provider]  metoprolol  succinate (TOPROL -XL) 100 MG 24 hr tablet Take 1 tablet (100 mg total) by mouth daily. Take with or immediately following a meal. Take with 50 mg tab to total 150 mg daily. Patient taking differently: Take 100 mg by mouth See admin instructions. Take 100 mg by mouth at bedtime, in conjunction with one 50 mg tablet to equal a total dose of 150 mg 11/20/22  Yes Camnitz, Will Gladis, MD  metoprolol  succinate (TOPROL -XL) 50 MG 24 hr tablet TAKE 1 TABLET DAILY WITH OR IMMEDIATELY FOLLOWING A MEAL. TAKE WITH 100 MG TAB TO TOTAL 150 MG DAILY Patient taking differently: Take 50 mg by mouth See admin instructions. Take 50 mg by mouth at bedtime, in conjunction with one 100 mg tablet to equal a total dose of 150 mg 02/06/23  Yes Camnitz, Will Gladis, MD  naloxone  (NARCAN ) nasal spray 4 mg/0.1 mL Place 1 spray into the nose as directed. 09/02/22  Yes [provider]  nystatin powder Apply 1 Application topically 3 (three) times daily as needed (for heat rashes).   Yes [provider]  Olopatadine  HCl (PATADAY ) 0.7 % SOLN Place 1 drop into both eyes daily. 12/29/19  Yes [provider]  potassium chloride  (KLOR-CON ) 10 MEQ tablet Take 2 tablets (20 mEq total) by mouth  daily. Patient taking differently: Take 10 mEq by mouth daily. 03/13/23  Yes Lesia Ozell Barter, PA-C  pregabalin  (LYRICA ) 150 MG capsule Take 150 mg by mouth See admin instructions. Take 150 mg by mouth two times a day and an additional 150 mg once day as needed for unresolved neuropathic pain 11/15/15  Yes [provider]  PROCTOFOAM [facility] rectal foam Place 1 applicator rectally 3 (three) times daily as needed for hemorrhoids or anal itching.   Yes [provider]  RESTASIS  MULTIDOSE  0.05 % ophthalmic emulsion Place 1 drop into both eyes in the morning and at bedtime. 03/12/20  Yes [provider]  rosuvastatin  (CRESTOR ) 20 MG tablet Take 20 mg by mouth daily.   Yes [provider]  senna (SENOKOT) 8.6 MG TABS tablet Take 2 tablets by mouth at bedtime.   Yes [provider]  SYNTHROID  25 MCG tablet Take 25 mcg by mouth daily before breakfast.   Yes [provider]  tiZANidine  (ZANAFLEX ) 4 MG tablet Take 8 mg by mouth at bedtime. 12/26/19  Yes [provider]  TYLENOL  500 MG tablet Take 500-1,000 mg by mouth every 6 (six) hours as needed for mild pain (pain score 1-3) or headache.   Yes [provider]  Vitamin D , Ergocalciferol , (DRISDOL ) 50000 units CAPS capsule Take 50,000 Units by mouth every Wednesday.  03/16/16  Yes [provider]  NAPOLEON INHUB 250-50 MCG/ACT AEPB Inhale 1 puff into the lungs in the morning and at bedtime.   Yes [provider]  naloxone  (NARCAN ) nasal spray 4 mg/0.1 mL Take 1 spray into nostril(s) every 3 minutes until patient awakes or EMS arrives. Patient not taking: Reported on 11/10/2023 10/30/23     spironolactone  (ALDACTONE ) 25 MG tablet TAKE 1 TABLET (25 MG TOTAL) BY MOUTH DAILY. 11/11/23   Lesia Ozell Barter, PA-C     Vital Signs: BP (!) 88/50 (BP Location: Left Arm)   Pulse 66   Temp 99.1 F (37.3 C) (Oral)   Resp 16   Ht 5' 9 (1.753 m)   Wt 254 lb 3.1 oz (115.3 kg)   LMP  (LMP Unknown)   SpO2 96%   BMI 37.54 kg/m   Physical Exam Vitals reviewed.  Constitutional:      General: She is not in acute distress.    Appearance: She is not ill-appearing.  HENT:     Head: Normocephalic and atraumatic.  Pulmonary:     Effort: Pulmonary effort is normal.  Abdominal:     General: Abdomen is flat.     Palpations: Abdomen is soft.     Comments: Positive RLQ drain to a gravity bag. Site is unremarkable with no erythema, edema, tenderness, bleeding or drainage. Suture and  stat lock in place. Dressing is clean, dry, and intact. ~10 ml of  tan colored fluid noted in the bag.  Skin:    General: Skin is warm and dry.     Coloration: Skin is not cyanotic or jaundiced.  Neurological:     Mental Status: She is alert.     Imaging: CT GUIDED PERITONEAL/RETROPERITONEAL FLUID DRAIN BY [facility] CATH Result Date: 11/11/2023 INDICATION: [id number] Perforated appendicitis [id number] EXAM: CT-GUIDED ABSCESS DRAIN PLACEMENT FOR RIGHT LOWER QUADRANT ABSCESS COMPARISON:  [facility] CT AP on [facility] PACS 11/10/2023. MEDICATIONS: The patient is currently admitted to the hospital and receiving intravenous antibiotics. The antibiotics were administered within an appropriate time frame prior to the initiation of the procedure. 25 mg Benadryl  IV was administered.  ANESTHESIA/SEDATION: Moderate (conscious) sedation was employed during this procedure. A total of Versed  3 mg and Fentanyl  100 mcg was administered intravenously. Moderate Sedation Time: 22 minutes. The patient's level of consciousness and vital signs were monitored continuously by radiology nursing throughout the procedure under my direct supervision. CONTRAST:  None COMPLICATIONS: None immediate. PROCEDURE: RADIATION DOSE REDUCTION: This exam was performed according to the departmental dose-optimization program which includes automated exposure control, adjustment of the mA and/or kV according to patient size and/or use of iterative reconstruction technique. Informed written consent was obtained from the patient and/or patient's representative after a discussion of the risks, benefits and alternatives to treatment. The patient was placed supine on the CT gantry and a pre procedural CT was performed re-demonstrating the known abscess/fluid collection within the RIGHT lower quadrant. The procedure was planned. A timeout was performed prior to the initiation of the procedure. The RIGHT lower quadrant was prepped and draped in the usual sterile fashion. The  overlying soft tissues were anesthetized with 1% lidocaine  with epinephrine . Appropriate trajectory was planned with the use of a 22 gauge spinal needle. An 18 gauge trocar needle was advanced into the abscess/fluid collection and a short Amplatz super stiff wire was coiled within the collection. Appropriate positioning was confirmed with a limited CT scan. The tract was serially dilated allowing placement of a 8 Fr drainage catheter. Appropriate positioning was confirmed with a limited postprocedural CT scan. 5 mL of purulent fluid was aspirated. The tube was connected to a drainage bag and sutured in place. A dressing was placed. The patient tolerated the procedure well without immediate post procedural complication. IMPRESSION: Successful CT guided placement of a 8 Fr drainage catheter into a RIGHT lower quadrant abscess with aspiration of 5 mL of purulent fluid. Samples were sent to the laboratory as requested by the ordering clinical team. RECOMMENDATIONS: The patient will return to Vascular Interventional Radiology (VIR) for routine drainage catheter evaluation in 10-14 days. Thom Hall, MD [facility] Radiology Electronically Signed   By: Thom Hall M.D.   On: 11/11/2023 16:19   US  Abdomen Limited RUQ (LIVER/GB) Result Date: 11/10/2023 CLINICAL DATA:  Cholelithiasis EXAM: ULTRASOUND ABDOMEN LIMITED RIGHT UPPER QUADRANT COMPARISON:  None Available. FINDINGS: Gallbladder: A negative sonographic Beverley sign was reported by the sonographer. Cholelithiasis measuring up to 1.7 cm. No pericholecystic fluid or gallbladder wall thickening. Common bile duct: Diameter: 7 mm Liver: Coarse hepatic echotexture with nodular contour. Portal vein is patent on color Doppler imaging with normal direction of blood flow towards the liver. Other: None. IMPRESSION: 1. Cholelithiasis without evidence of acute cholecystitis. 2. Cirrhosis. Electronically Signed   By: Franky Stanford  M.D.   On: 11/10/2023 21:29   CUP PACEART REMOTE DEVICE CHECK Result Date: 11/09/2023 Scheduled remote reviewed. Normal device function.  1 AHR lasting 6 seconds. Next remote 91 days. - CS, CVRS   Labs:  CBC: Recent Labs    11/13/22 1159 11/10/23 1730 11/11/23 0630 11/12/23 0722  WBC 6.5 6.3 6.0 5.9  HGB 16.1* 13.4 12.5 11.4*  HCT 46.8* 40.9 38.9 35.1*  PLT 158 126* 124* 121*    COAGS: Recent Labs    11/11/23 0802  INR 1.4*    BMP: Recent Labs    08/31/23 1149 11/10/23 1730 11/11/23 0630 11/12/23 0722  NA 139 131* 133* 130*  K 5.2 3.3* 4.4 4.1  CL 100 96* 98 97*  CO2 21 21* 25 22  GLUCOSE 173* 221* 214* 207*  BUN  22 11 9 9   CALCIUM  10.1 9.3 9.3 8.6*  CREATININE 1.12* 1.11* 1.00 1.29*  GFRNONAA  --  56* >60 47*    LIVER FUNCTION TESTS: Recent Labs    11/10/23 1730 11/11/23 0630  BILITOT 0.8 1.0  AST 33 62*  ALT 28 38  ALKPHOS 125 132*  PROT 7.4 6.8  ALBUMIN 3.1* 2.8*    Assessment and Plan:  63 y.o. female with pef'd appy s/p RLQ drain placement by Dr. Hughes on 11/11/23   VS has been hypotensive, stable otherwise  CBC stable  Cx pending  No output documented, sent RN Parmer asked make sure output is measured and documented q shift  Drain added to the avatar   Drain Location: RLQ Size: 8 Fr Date of placement: 11/11/23  Currently to: Drain collection device: gravity 24 hour output:  Output by Drain (mL) 11/10/23 0701 - 11/10/23 1900 11/10/23 1901 - 11/11/23 0700 11/11/23 0701 - 11/11/23 1900 11/11/23 1901 - 11/12/23 0700 11/12/23 0701 - 11/12/23 1638  Patient has no LDAs of requested type attached.    Interval imaging/drain manipulation:  None   Current examination: Flushes/aspirates easily.  Insertion site unremarkable. Suture and stat lock in place. Dressed appropriately.   Plan: Continue TID flushes with 5 cc NS. Record output Q shift. Dressing changes QD or PRN if soiled.  Call IR APP or on call IR MD if difficulty flushing or  sudden change in drain output.  Repeat imaging/possible drain injection once output < 10 mL/QD (excluding flush material). Consideration for drain removal if output is < 10 mL/QD (excluding flush material), pending discussion with the providing surgical service.  Discharge planning: Please contact IR APP or on call IR MD prior to patient d/c to ensure appropriate follow up plans are in place. Typically patient will follow up with IR clinic 10-14 days post d/c for repeat imaging/possible drain injection. IR scheduler will contact patient with date/time of appointment. Patient will need to flush drain QD with 5 cc NS, record output QD, dressing changes every 2-3 days or earlier if soiled.   IR will continue to follow - please call with questions or concerns.  Electronically Signed: Toya VEAR Cousin, PA-C 11/12/2023, 4:35 PM   I spent a total of 15 Minutes at the the patient's bedside AND on the patient's hospital floor or unit, greater than 50% of which was counseling/coordinating care for RLQ drain f/u.   This chart was dictated using voice recognition software.  Despite best efforts to proofread,  errors can occur which can change the documentation meaning.

## 2023-11-12 NOTE — Progress Notes (Signed)
 Flushed drain 5 ml NS per orders.

## 2023-11-12 NOTE — Plan of Care (Signed)

## 2023-11-12 NOTE — Progress Notes (Signed)
   11/12/23 0436  Assess: MEWS Score  Temp 98.1 F (36.7 C)  BP (!) 79/53  MAP (mmHg) (!) 63  Pulse Rate 61  Resp 18  SpO2 100 %  O2 Device Room Air  Assess: MEWS Score  MEWS Temp 0  MEWS Systolic 2  MEWS Pulse 0  MEWS RR 0  MEWS LOC 0  MEWS Score 2  MEWS Score Color Yellow  Assess: if the MEWS score is Yellow or Red  Were vital signs accurate and taken at a resting state? Yes  Does the patient meet 2 or more of the SIRS criteria? No  MEWS guidelines implemented  Yes, yellow  Treat  MEWS Interventions Considered administering scheduled or prn medications/treatments as ordered  Take Vital Signs  Increase Vital Sign Frequency  Yellow: Q2hr x1, continue Q4hrs until patient remains green for 12hrs  Escalate  MEWS: Escalate Yellow: Discuss with charge nurse and consider notifying provider and/or RRT  Notify: Charge Nurse/RN  Name of Charge Nurse/RN Notified [place], RN  Provider Notification  Provider Name/Title Lavanda Horns, NP  Date Provider Notified 11/12/23  Time Provider Notified [phone]  Method of Notification Page  Notification Reason Other (Comment) (yellow MEWS)  Provider response See new orders  Date of Provider Response 11/12/23  Time of Provider Response 0444  Assess: SIRS CRITERIA  SIRS Temperature  0  SIRS Respirations  0  SIRS Pulse 0  SIRS WBC 0  SIRS Score Sum  0

## 2023-11-12 NOTE — Progress Notes (Signed)
 PROGRESS NOTE    [name]  FMW:[id number] DOB: [birth year] DOA: 11/10/2023 PCP: Debrah Josette ORN., PA-C    Brief Narrative:  63 year old with history of paroxysmal A-fib status post ablation on Eliquis , CHF with reduced EF, tachybradycardia syndrome status post pacemaker, cirrhosis, HTN, HLD, hypothyroidism, chronic lower back pain status post spinal stimulator, chronic narcotic use, tobacco use admitted for acute perforated appendicitis and periappendiceal abscess.  Patient seen by general surgery and IR.  Had drainage catheter placed by IR on 2/5.   Assessment & Plan:  Principal Problem:   Acute perforated appendicitis Active Problems:   Cholelithiasis   Thrombocytopenia (HCC)   Paroxysmal atrial fibrillation (HCC)   Benign essential hypertension   Tobacco use   Chronic pain syndrome   Tachy-brady syndrome (HCC)   Cirrhosis (HCC)   Chronic heart failure with mildly reduced ejection fraction (HFmrEF, 41-49%) (HCC)   COPD (chronic obstructive pulmonary disease) (HCC)   Hypothyroidism   Abscess, periappendiceal   Acute perforated appendicitis with contained periappendiceal abscess: -Noted on outpatient CT scan in care everywhere.  There is concerns of acute perforated appendicitis with periappendiceal abscess.  Possible developing rectocutaneous fistula.  Seen by general surgery and IR.  Had drainage catheter placed on 2/5.  Continue Zosyn .  Cultures are growing multiple organisms, identification and susceptibility are pending.   Cholelithiasis without evidence of cholecystitis -Ultrasound performed.  Unfortunately spinal stimulator is not compatible with MRI.  FTs are normal and asymptomatic.  Clinically monitor  Soft blood pressure History of essential hypertension - Combination of multiple medications.  Will discontinue home Coreg, Aldactone , losartan  and HCTZ for now.  Will give very gentle hydration, midodrine if needed    Paroxysmal atrial fibrillation/flutter s/p  ablation Tachy-brady syndrome s/p PPM: Stable.  Continue Toprol -XL 150 mg daily.  Resume Eliquis  once cleared by IR IV as needed   HFmrEF: EF 45% Stable, euvolemic on admission.  EF 45-50% by TTE 04/29/2023. -Continue Toprol , Aldactone  and losartan    COPD: As needed bronchodilators   Cirrhosis: History of alcohol use -Alcohol withdrawal protocol   Hypothyroidism: Continue Synthroid .   Hyperlipidemia: Continue rosuvastatin .   Thrombocytopenia: Mild in setting of cirrhosis.  No obvious bleeding.   Chronic back pain: S/p spinal cord stimulator.  Continue Lyrica  and Percocet prn.   Tobacco use: Patient reports smoking 1 pack/day.  Nicotine  patch provided.  Smoking cessation advised.   DVT prophylaxis: SCDs Start: 11/10/23 2017    Code Status: Full Code Family Communication:  called [facility], went to vm.  Status is: Inpatient Remains inpatient appropriate because: Continue hospital stay for management of intra-abdominal symptoms    Subjective: When I walked in the room, continued to ask for pain medication.  I explained to her due to soft blood pressure we will reassess giving her pain medication and give it accordingly.  Overall she appears to be comfortable.  Examination:  General exam: Appears calm and comfortable  Respiratory system: Clear to auscultation. Respiratory effort normal. Cardiovascular system: S1 & S2 heard, RRR. No JVD, murmurs, rubs, gallops or clicks. No pedal edema. Gastrointestinal system: Abdomen is nondistended, soft and nontender. No organomegaly or masses felt. Normal bowel sounds heard. Central nervous system: Alert and oriented. No focal neurological deficits. Extremities: Symmetric 5 x 5 power. Skin: No rashes, lesions or ulcers Psychiatry: Judgement and insight appear normal. Mood & affect appropriate.                Diet Orders (From admission, onward)     Start  Ordered   11/11/23 1616  Diet Heart Room service appropriate?  Yes; Fluid consistency: Thin  Diet effective now       Question Answer Comment  Room service appropriate? Yes   Fluid consistency: Thin      11/11/23 1616            Objective: Vitals:   11/12/23 0629 11/12/23 0858 11/12/23 1008 11/12/23 1030  BP: (!) 82/61 (!) 76/41  (!) 101/46  Pulse: 64 63  67  Resp:      Temp: 98.3 F (36.8 C)     TempSrc: Oral     SpO2:  97% 92%   Weight:      Height:        Intake/Output Summary (Last 24 hours) at 11/12/2023 1241 Last data filed at 11/12/2023 0851 Gross per 24 hour  Intake 1568.35 ml  Output --  Net 1568.35 ml   Filed Weights   11/10/23 1710 11/12/23 0500  Weight: 114.8 kg 115.3 kg    Scheduled Meds:  apixaban   5 mg Oral BID   chlordiazePOXIDE   25 mg Oral BID   Followed by   NOREEN ON 11/13/2023] chlordiazePOXIDE   25 mg Oral Q1500   cycloSPORINE   1 drop Both Eyes BID   famotidine   20 mg Oral BID   folic acid   1 mg Oral Daily   HYDROmorphone   4 mg Oral Q4H   levothyroxine   25 mcg Oral Q0600   mometasone -formoterol   2 puff Inhalation BID   multivitamin with minerals  1 tablet Oral Daily   nicotine   21 mg Transdermal Daily   potassium chloride   10 mEq Oral Daily   pregabalin   150 mg Oral BID   rosuvastatin   20 mg Oral Daily   senna  2 tablet Oral QHS   sodium chloride  flush  3 mL Intravenous Q12H   sodium chloride  flush  5 mL Intracatheter Q8H   thiamine   100 mg Oral Daily   Or   thiamine   100 mg Intravenous Daily   tiZANidine   8 mg Oral QHS   Continuous Infusions:  piperacillin -tazobactam (ZOSYN )  IV 3.375 g (11/12/23 1057)   sodium chloride       Nutritional status     Body mass index is 37.54 kg/m.  Data Reviewed:   CBC: Recent Labs  Lab 11/10/23 1730 11/11/23 0630 11/12/23 0722  WBC 6.3 6.0 5.9  NEUTROABS 4.2  --   --   HGB 13.4 12.5 11.4*  HCT 40.9 38.9 35.1*  MCV 101.2* 103.2* 101.4*  PLT 126* 124* 121*   Basic Metabolic Panel: Recent Labs  Lab 11/10/23 1730 11/11/23 0630 11/12/23 0722   NA 131* 133* 130*  K 3.3* 4.4 4.1  CL 96* 98 97*  CO2 21* 25 22  GLUCOSE 221* 214* 207*  BUN 11 9 9   CREATININE 1.11* 1.00 1.29*  CALCIUM  9.3 9.3 8.6*  MG  --  2.3 2.1  PHOS  --   --  2.6   GFR: Estimated Creatinine Clearance: 61.2 mL/min (A) (by C-G formula based on SCr of 1.29 mg/dL (H)). Liver Function Tests: Recent Labs  Lab 11/10/23 1730 11/11/23 0630  AST 33 62*  ALT 28 38  ALKPHOS 125 132*  BILITOT 0.8 1.0  PROT 7.4 6.8  ALBUMIN 3.1* 2.8*   Recent Labs  Lab 11/10/23 1730  LIPASE 24   No results for input(s): AMMONIA in the last 168 hours. Coagulation Profile: Recent Labs  Lab 11/11/23 0802  INR 1.4*  Cardiac Enzymes: No results for input(s): CKTOTAL, CKMB, CKMBINDEX, TROPONINI in the last 168 hours. BNP (last 3 results) Recent Labs    07/22/23 1243 08/31/23 1149  PROBNP 331* 457*   HbA1C: No results for input(s): HGBA1C in the last 72 hours. CBG: No results for input(s): GLUCAP in the last 168 hours. Lipid Profile: No results for input(s): CHOL, HDL, LDLCALC, TRIG, CHOLHDL, LDLDIRECT in the last 72 hours. Thyroid Function Tests: No results for input(s): TSH, T4TOTAL, FREET4, T3FREE, THYROIDAB in the last 72 hours. Anemia Panel: No results for input(s): VITAMINB12, FOLATE, FERRITIN, TIBC, IRON, RETICCTPCT in the last 72 hours. Sepsis Labs: No results for input(s): PROCALCITON, LATICACIDVEN in the last 168 hours.  Recent Results (from the past 240 hours)  Aerobic/Anaerobic Culture w Gram Stain (surgical/deep wound)     Status: None (Preliminary result)   Collection Time: 11/11/23  4:11 PM   Specimen: Abscess  Result Value Ref Range Status   Specimen Description ABSCESS ABDOMEN  Final   Special Requests RLQ  Final   Gram Stain   Final    MODERATE WBC PRESENT, PREDOMINANTLY PMN MODERATE GRAM NEGATIVE RODS MODERATE GRAM POSITIVE RODS    Culture   Final    ABUNDANT GRAM NEGATIVE  RODS IDENTIFICATION AND SUSCEPTIBILITIES TO FOLLOW Performed at [facility], [address] [place], [place] [address]    Report Status PENDING  Incomplete         Radiology Studies: CT GUIDED PERITONEAL/RETROPERITONEAL FLUID DRAIN BY [facility] CATH Result Date: 11/11/2023 INDICATION: [id number] Perforated appendicitis [id number] EXAM: CT-GUIDED ABSCESS DRAIN PLACEMENT FOR RIGHT LOWER QUADRANT ABSCESS COMPARISON:  OS[facility] CT AP on [facility] PACS 11/10/2023. MEDICATIONS: The patient is currently admitted to the hospital and receiving intravenous antibiotics. The antibiotics were administered within an appropriate time frame prior to the initiation of the procedure. 25 mg Benadryl  IV was administered. ANESTHESIA/SEDATION: Moderate (conscious) sedation was employed during this procedure. A total of Versed  3 mg and Fentanyl  100 mcg was administered intravenously. Moderate Sedation Time: 22 minutes. The patient's level of consciousness and vital signs were monitored continuously by radiology nursing throughout the procedure under my direct supervision. CONTRAST:  None COMPLICATIONS: None immediate. PROCEDURE: RADIATION DOSE REDUCTION: This exam was performed according to the departmental dose-optimization program which includes automated exposure control, adjustment of the mA and/or kV according to patient size and/or use of iterative reconstruction technique. Informed written consent was obtained from the patient and/or patient's representative after a discussion of the risks, benefits and alternatives to treatment. The patient was placed supine on the CT gantry and a pre procedural CT was performed re-demonstrating the known abscess/fluid collection within the RIGHT lower quadrant. The procedure was planned. A timeout was performed prior to the initiation of the procedure. The RIGHT lower quadrant was prepped and draped in the usual sterile fashion. The overlying soft tissues were anesthetized with 1% lidocaine  with  epinephrine . Appropriate trajectory was planned with the use of a 22 gauge spinal needle. An 18 gauge trocar needle was advanced into the abscess/fluid collection and a short Amplatz super stiff wire was coiled within the collection. Appropriate positioning was confirmed with a limited CT scan. The tract was serially dilated allowing placement of a 8 Fr drainage catheter. Appropriate positioning was confirmed with a limited postprocedural CT scan. 5 mL of purulent fluid was aspirated. The tube was connected to a drainage bag and sutured in place. A dressing was placed. The patient tolerated the procedure well without immediate post procedural complication.  IMPRESSION: Successful CT guided placement of a 8 Fr drainage catheter into a RIGHT lower quadrant abscess with aspiration of 5 mL of purulent fluid. Samples were sent to the laboratory as requested by the ordering clinical team. RECOMMENDATIONS: The patient will return to Vascular Interventional Radiology (VIR) for routine drainage catheter evaluation in 10-14 days. Thom Hall, MD [facility] Radiology Electronically Signed   By: Thom Hall M.D.   On: 11/11/2023 16:19   US  Abdomen Limited RUQ (LIVER/GB) Result Date: 11/10/2023 CLINICAL DATA:  Cholelithiasis EXAM: ULTRASOUND ABDOMEN LIMITED RIGHT UPPER QUADRANT COMPARISON:  None Available. FINDINGS: Gallbladder: A negative sonographic Beverley sign was reported by the sonographer. Cholelithiasis measuring up to 1.7 cm. No pericholecystic fluid or gallbladder wall thickening. Common bile duct: Diameter: 7 mm Liver: Coarse hepatic echotexture with nodular contour. Portal vein is patent on color Doppler imaging with normal direction of blood flow towards the liver. Other: None. IMPRESSION: 1. Cholelithiasis without evidence of acute cholecystitis. 2. Cirrhosis. Electronically Signed   By: Franky Stanford M.D.   On: 11/10/2023 21:29           LOS: 2 days    Time spent= 35 mins    Burgess JAYSON Dare, MD [facility]  If 7PM-7AM, please contact night-coverage  11/12/2023, 12:41 PM

## 2023-11-12 NOTE — Progress Notes (Signed)
 Bolus of 250 ml given. BP 101/46.  Pt feels fine, but states a mild h/s and fatigue.Will continue to monitor.

## 2023-11-12 NOTE — Progress Notes (Signed)
 Assessment & Plan: HD#3 - Perforated appendicitis with abscess  - perc drain of appendiceal abscess completed by IR  - IV Zosyn    Cholelithiasis  - known gallstones, asymptomatic Perirectal inflammation - no clinical signs of abscess and patient has no perirectal pain ETOH abuse - would recommend CIWA Cirrhosis Tobacco abuse Tachy-brady syndrome Paroxsymal A fib - may resume anticoagulation when OK with IR HTN HLD Hypothyroidism Chronic back pain with stimulator on chronic narcotics  Will follow.  No plans for operative intervention at present.  May require interval appendectomy in 6 weeks depending on radiographic evaluation after drain removal.  Discussed with patient.        Krystal Spinner, MD [facility] practice Office: [phone]        Chief Complaint: Perforated acute appendicitis with abscess  Subjective: Patient in bed, comfortable, eating breakfast.  Objective: Vital signs in last 24 hours: Temp:  [97.7 F (36.5 C)-98.3 F (36.8 C)] 98.3 F (36.8 C) (02/06 0629) Pulse Rate:  [60-71] 64 (02/06 0629) Resp:  [10-21] 18 (02/06 0436) BP: (79-112)/(53-75) 82/61 (02/06 0629) SpO2:  [90 %-100 %] 100 % (02/06 0436) Weight:  [115.3 kg] 115.3 kg (02/06 0500) Last BM Date : 11/10/23  Intake/Output from previous day: 02/05 0701 - 02/06 0700 In: 1328.4 [I.V.:1113.5; IV Piggyback:214.9] Out: -  Intake/Output this shift: No intake/output data recorded.  Physical Exam: HEENT - sclerae clear, mucous membranes moist Neck - soft Abdomen - soft, obese, minimal tenderness; purulent fluid in drainage bag   Lab Results:  Recent Labs    11/10/23 1730 11/11/23 0630  WBC 6.3 6.0  HGB 13.4 12.5  HCT 40.9 38.9  PLT 126* 124*   BMET Recent Labs    11/10/23 1730 11/11/23 0630  NA 131* 133*  K 3.3* 4.4  CL 96* 98  CO2 21* 25  GLUCOSE 221* 214*  BUN 11 9  CREATININE 1.11* 1.00  CALCIUM  9.3 9.3   PT/INR Recent Labs     11/11/23 0802  LABPROT 17.5*  INR 1.4*   Comprehensive Metabolic Panel:    Component Value Date/Time   NA 133 (L) 11/11/2023 0630   NA 131 (L) 11/10/2023 1730   NA 139 08/31/2023 1149   NA 141 07/22/2023 1243   K 4.4 11/11/2023 0630   K 3.3 (L) 11/10/2023 1730   CL 98 11/11/2023 0630   CL 96 (L) 11/10/2023 1730   CO2 25 11/11/2023 0630   CO2 21 (L) 11/10/2023 1730   BUN 9 11/11/2023 0630   BUN 11 11/10/2023 1730   BUN 22 08/31/2023 1149   BUN 13 07/22/2023 1243   CREATININE 1.00 11/11/2023 0630   CREATININE 1.11 (H) 11/10/2023 1730   GLUCOSE 214 (H) 11/11/2023 0630   GLUCOSE 221 (H) 11/10/2023 1730   CALCIUM  9.3 11/11/2023 0630   CALCIUM  9.3 11/10/2023 1730   AST 62 (H) 11/11/2023 0630   AST 33 11/10/2023 1730   ALT 38 11/11/2023 0630   ALT 28 11/10/2023 1730   ALKPHOS 132 (H) 11/11/2023 0630   ALKPHOS 125 11/10/2023 1730   BILITOT 1.0 11/11/2023 0630   BILITOT 0.8 11/10/2023 1730   PROT 6.8 11/11/2023 0630   PROT 7.4 11/10/2023 1730   ALBUMIN 2.8 (L) 11/11/2023 0630   ALBUMIN 3.1 (L) 11/10/2023 1730    Studies/Results: CT GUIDED PERITONEAL/RETROPERITONEAL FLUID DRAIN BY [facility] CATH Result Date: 11/11/2023 INDICATION: [id number] Perforated appendicitis [id number] EXAM: CT-GUIDED ABSCESS DRAIN PLACEMENT FOR RIGHT LOWER QUADRANT ABSCESS COMPARISON:  OS[facility] CT AP on [facility] PACS 11/10/2023. MEDICATIONS: The patient is currently admitted to the hospital and receiving intravenous antibiotics. The antibiotics were administered within an appropriate time frame prior to the initiation of the procedure. 25 mg Benadryl  IV was administered. ANESTHESIA/SEDATION: Moderate (conscious) sedation was employed during this procedure. A total of Versed  3 mg and Fentanyl  100 mcg was administered intravenously. Moderate Sedation Time: 22 minutes. The patient's level of consciousness and vital signs were monitored continuously by radiology nursing throughout the procedure under my direct supervision. CONTRAST:   None COMPLICATIONS: None immediate. PROCEDURE: RADIATION DOSE REDUCTION: This exam was performed according to the departmental dose-optimization program which includes automated exposure control, adjustment of the mA and/or kV according to patient size and/or use of iterative reconstruction technique. Informed written consent was obtained from the patient and/or patient's representative after a discussion of the risks, benefits and alternatives to treatment. The patient was placed supine on the CT gantry and a pre procedural CT was performed re-demonstrating the known abscess/fluid collection within the RIGHT lower quadrant. The procedure was planned. A timeout was performed prior to the initiation of the procedure. The RIGHT lower quadrant was prepped and draped in the usual sterile fashion. The overlying soft tissues were anesthetized with 1% lidocaine  with epinephrine . Appropriate trajectory was planned with the use of a 22 gauge spinal needle. An 18 gauge trocar needle was advanced into the abscess/fluid collection and a short Amplatz super stiff wire was coiled within the collection. Appropriate positioning was confirmed with a limited CT scan. The tract was serially dilated allowing placement of a 8 Fr drainage catheter. Appropriate positioning was confirmed with a limited postprocedural CT scan. 5 mL of purulent fluid was aspirated. The tube was connected to a drainage bag and sutured in place. A dressing was placed. The patient tolerated the procedure well without immediate post procedural complication. IMPRESSION: Successful CT guided placement of a 8 Fr drainage catheter into a RIGHT lower quadrant abscess with aspiration of 5 mL of purulent fluid. Samples were sent to the laboratory as requested by the ordering clinical team. RECOMMENDATIONS: The patient will return to Vascular Interventional Radiology (VIR) for routine drainage catheter evaluation in 10-14 days. Thom Hall, MD [facility] Radiology Electronically Signed   By: Thom Hall M.D.   On: 11/11/2023 16:19   US  Abdomen Limited RUQ (LIVER/GB) Result Date: 11/10/2023 CLINICAL DATA:  Cholelithiasis EXAM: ULTRASOUND ABDOMEN LIMITED RIGHT UPPER QUADRANT COMPARISON:  None Available. FINDINGS: Gallbladder: A negative sonographic Beverley sign was reported by the sonographer. Cholelithiasis measuring up to 1.7 cm. No pericholecystic fluid or gallbladder wall thickening. Common bile duct: Diameter: 7 mm Liver: Coarse hepatic echotexture with nodular contour. Portal vein is patent on color Doppler imaging with normal direction of blood flow towards the liver. Other: None. IMPRESSION: 1. Cholelithiasis without evidence of acute cholecystitis. 2. Cirrhosis. Electronically Signed   By: Franky Stanford M.D.   On: 11/10/2023 21:29      Krystal Spinner 11/12/2023  Patient ID: [name], female   DOB: 23-Jan-1961, 63 y.o.   MRN: [id number]

## 2023-11-13 ENCOUNTER — Other Ambulatory Visit (HOSPITAL_COMMUNITY): Payer: Self-pay

## 2023-11-13 DIAGNOSIS — K3532 Acute appendicitis with perforation and localized peritonitis, without abscess: Secondary | ICD-10-CM | POA: Diagnosis not present

## 2023-11-13 LAB — CBC
HCT: 36 % (ref 36.0–46.0)
Hemoglobin: 11.4 g/dL — ABNORMAL LOW (ref 12.0–15.0)
MCH: 32.7 pg (ref 26.0–34.0)
MCHC: 31.7 g/dL (ref 30.0–36.0)
MCV: 103.2 fL — ABNORMAL HIGH (ref 80.0–100.0)
Platelets: 151 10*3/uL (ref 150–400)
RBC: 3.49 MIL/uL — ABNORMAL LOW (ref 3.87–5.11)
RDW: 15 % (ref 11.5–15.5)
WBC: 5.1 10*3/uL (ref 4.0–10.5)
nRBC: 0 % (ref 0.0–0.2)

## 2023-11-13 LAB — BASIC METABOLIC PANEL
Anion gap: 9 (ref 5–15)
BUN: 9 mg/dL (ref 8–23)
CO2: 22 mmol/L (ref 22–32)
Calcium: 8.8 mg/dL — ABNORMAL LOW (ref 8.9–10.3)
Chloride: 103 mmol/L (ref 98–111)
Creatinine, Ser: 1.01 mg/dL — ABNORMAL HIGH (ref 0.44–1.00)
GFR, Estimated: >60 mL/min (ref >60)
Glucose, Bld: 200 mg/dL — ABNORMAL HIGH (ref 70–99)
Potassium: 3.8 mmol/L (ref 3.5–5.1)
Sodium: 134 mmol/L — ABNORMAL LOW (ref 135–145)

## 2023-11-13 LAB — MAGNESIUM: Magnesium: 2.2 mg/dL (ref 1.7–2.4)

## 2023-11-13 MED ORDER — SODIUM CHLORIDE 0.9% FLUSH
10.0000 mL | INTRAVENOUS | 0 refills | Status: AC | PRN
  Filled 2023-11-13: qty 300, 30d supply, fill #0

## 2023-11-13 MED ORDER — DOCUSATE SODIUM 100 MG PO CAPS
100.0000 mg | ORAL_CAPSULE | Freq: Two times a day (BID) | ORAL | Status: DC
  Administered 2023-11-13 – 2023-11-14 (×3): 100 mg via ORAL
  Filled 2023-11-13 (×3): qty 1

## 2023-11-13 MED ORDER — POLYETHYLENE GLYCOL 3350 17 G PO PACK
17.0000 g | PACK | Freq: Every day | ORAL | Status: DC
  Administered 2023-11-13: 17 g via ORAL
  Filled 2023-11-13 (×2): qty 1

## 2023-11-13 NOTE — TOC Initial Note (Signed)
 Transition of Care [facility]) - Initial/Assessment Note    Patient Details  Name: [name] MRN: [id number] Date of Birth: 01-05-1961  Transition of Care [facility]) CM/SW Contact:    Toy LITTIE Agar, RN Phone [phone]  11/13/2023, 3:27 PM  Clinical Narrative:                 [facility] acknowledges consult for Substance Abuse Couseling/Education. CM at bedside introduces self and explains consult. Patient states that she does not have a substance abuse problems and only drinks occasionally. Does not want resources.         Patient Goals and CMS Choice            Expected Discharge Plan and Services                                              Prior Living Arrangements/Services                       Activities of Daily Living   ADL Screening (condition at time of admission) Independently performs ADLs?: Yes (appropriate for developmental age) Is the patient deaf or have difficulty hearing?: No Does the patient have difficulty seeing, even when wearing glasses/contacts?: No Does the patient have difficulty concentrating, remembering, or making decisions?: No  Permission Sought/Granted                  Emotional Assessment              Admission diagnosis:  Acute perforated appendicitis [K35.32] Acute appendicitis, unspecified acute appendicitis type [K35.80] Patient Active Problem List   Diagnosis Date Noted   Acute perforated appendicitis 11/10/2023   Cholelithiasis 11/10/2023   Chronic heart failure with mildly reduced ejection fraction (HFmrEF, 41-49%) (HCC) 11/10/2023   COPD (chronic obstructive pulmonary disease) (HCC) 11/10/2023   Abscess, periappendiceal 11/10/2023   Cirrhosis (HCC)    Hypothyroidism    Tachy-brady syndrome (HCC) 08/05/2018   Closed fracture of left ankle 06/01/2018   Paroxysmal atrial fibrillation (HCC) 06/01/2018   Benign essential hypertension 06/01/2018   Atrial flutter (HCC) 06/01/2018   Near syncope  06/01/2018   Bradycardia    Posterior tibial tendon dysfunction (PTTD) of right lower extremity 03/15/2018   Primary localized osteoarthrosis of ankle and foot 01/21/2018   Long-term current use of opiate analgesic 11/10/2017   Therapeutic opioid induced constipation 10/27/2017   On long term drug therapy 10/14/2017   Chronic low back pain 10/14/2017   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   Chronic pain syndrome 10/14/2017   Pain in left foot 10/12/2017   Pain in right foot 10/12/2017   B12 deficiency 04/21/2016   Cobalamin deficiency 04/21/2016   Thrombocytopenia (HCC) 11/28/2015   Chronic pain 11/28/2015   Disease of jaw 11/28/2015   Hypokalemia 11/28/2015   Impaired fasting glucose 11/28/2015   Swelling of both lower extremities 11/28/2015   Tobacco use 11/28/2015   Vertigo 11/28/2015   Hereditary and idiopathic neuropathy 11/28/2015   Hereditary and idiopathic peripheral neuropathy 09/17/2009   Disorder of peripheral nervous system 09/17/2009   Alcoholic cirrhosis of liver (HCC) 10/02/2008   Jaundice, non-neonatal 10/02/2008   Generalized abdominal pain 10/02/2008   Ascites 10/02/2008   Jaundice 10/02/2008   Esophageal varices (HCC) 04/04/2008   Esophageal varices (HCC) 04/04/2008   Alcohol abuse 03/16/2008  TRANSAMINASES, SERUM, ELEVATED 03/16/2008   Elevated levels of transaminase & lactic acid dehydrogenase 03/16/2008   SCARLET FEVER 03/14/2008   Hyperlipidemia 03/14/2008   External hemorrhoids 03/14/2008   Umbilical hernia 03/14/2008   Endometriosis 03/14/2008   DYSMENORRHEA 03/14/2008   INSOMNIA UNSPECIFIED 03/14/2008   Large liver 03/14/2008   Splenomegaly 03/14/2008   LIVER FUNCTION TESTS, ABNORMAL, HX OF 03/14/2008   History of disease of skin and subcutaneous tissue 03/14/2008   Insomnia 03/14/2008   Allergic rhinitis 05/31/2007   Gastroesophageal reflux disease 05/31/2007   Osteoarthritis 05/31/2007   PCP:  Debrah Josette ORN., PA-C Pharmacy:   [facility] [phone] [address] [place], [place] - [address] [place] [address] Phone: [phone] Fax: [phone]  [place] [facility] [address] [place] [address] Phone: [phone] Fax: [phone]     Social Drivers of Health (SDOH) Social History: SDOH Screenings   Food Insecurity: No Food Insecurity (11/11/2023)  Housing: Low Risk  (11/11/2023)  Transportation Needs: No Transportation Needs (11/11/2023)  Utilities: Not At Risk (11/11/2023)  Financial Resource Strain: Medium Risk (04/13/2023)   Received from [facility]  Physical Activity: Unknown (06/10/2022)   [facility] visits prior to 12/06/2022.  Social Connections: Unknown (06/10/2022)   [facility] prior to 12/06/2022[facility] visits prior to 12/06/2022.  Stress: No Stress Concern Present (06/10/2022)   [facility] prior to 12/06/2022[facility] visits prior to 12/06/2022.  Tobacco Use: High Risk (11/11/2023)   SDOH Interventions:     Readmission Risk Interventions     No data to display

## 2023-11-13 NOTE — Progress Notes (Signed)
 Referring Physician(s): Dr. Belinda   Supervising Physician: Luverne Aran  Patient Status:  [facility] - In-pt  Chief Complaint:  perforated appendicitis  S/p RLQ drain placement by Dr. Hughes on 11/11/23   Subjective:  Laying in bed, NAD No complaints related to drain.   Allergies: Ambrosia artemisiifolia (ragweed) skin test, Short ragweed pollen ext, Sulfa antibiotics, Vancomycin, Teicoplanin, and Azithromycin  Medications: Prior to Admission medications   Medication Sig Start Date End Date Taking? Authorizing Provider  albuterol  (PROVENTIL  HFA;VENTOLIN  HFA) 108 (90 Base) MCG/ACT inhaler Inhale 2 puffs into the lungs every 6 (six) hours as needed for wheezing or shortness of breath.  05/14/18  Yes [provider]  B Complex Vitamins (B COMPLEX PO) Take 1 tablet by mouth daily.   Yes [provider]  Coenzyme Q10 (CO Q 10) 100 MG CAPS Take 100 mg by mouth at bedtime.   Yes [provider]  ELIQUIS  5 MG TABS tablet TAKE 1 TABLET BY MOUTH TWICE A DAY 09/16/23  Yes Delford Maude BROCKS, MD  famotidine  (PEPCID ) 20 MG tablet Take 20 mg by mouth 2 (two) times daily. New Zantac 360T 05/26/18  Yes [provider]  fluticasone  (FLONASE ) 50 MCG/ACT nasal spray Place 2 sprays into both nostrils as needed for allergies or rhinitis (or seasonal allergies).   Yes [provider]  folic acid  (FOLVITE ) 1 MG tablet Take 1 mg by mouth daily. 04/01/16  Yes [provider]  HYDROmorphone  (DILAUDID ) 4 MG tablet Take 1 tablet by mouth every 4-6 hours as needed. Patient taking differently: Take 4 mg by mouth See admin instructions. Take 4 mg by mouth every four hours 10/30/23  Yes   losartan -hydrochlorothiazide  (HYZAAR) 50-12.5 MG tablet Take 1 tablet by mouth daily. 07/28/23  Yes Nishan, Peter C, MD  magnesium  gluconate (MAGONATE) 500 MG tablet Take 500 mg by mouth at bedtime.   Yes [provider]  metaxalone  (SKELAXIN ) 800 MG tablet Take 800 mg by  mouth 2 (two) times daily. 02/20/20  Yes [provider]  metoprolol  succinate (TOPROL -XL) 100 MG 24 hr tablet Take 1 tablet (100 mg total) by mouth daily. Take with or immediately following a meal. Take with 50 mg tab to total 150 mg daily. Patient taking differently: Take 100 mg by mouth See admin instructions. Take 100 mg by mouth at bedtime, in conjunction with one 50 mg tablet to equal a total dose of 150 mg 11/20/22  Yes Camnitz, Will Gladis, MD  metoprolol  succinate (TOPROL -XL) 50 MG 24 hr tablet TAKE 1 TABLET DAILY WITH OR IMMEDIATELY FOLLOWING A MEAL. TAKE WITH 100 MG TAB TO TOTAL 150 MG DAILY Patient taking differently: Take 50 mg by mouth See admin instructions. Take 50 mg by mouth at bedtime, in conjunction with one 100 mg tablet to equal a total dose of 150 mg 02/06/23  Yes Camnitz, Will Gladis, MD  naloxone  (NARCAN ) nasal spray 4 mg/0.1 mL Place 1 spray into the nose as directed. 09/02/22  Yes [provider]  nystatin powder Apply 1 Application topically 3 (three) times daily as needed (for heat rashes).   Yes [provider]  Olopatadine  HCl (PATADAY ) 0.7 % SOLN Place 1 drop into both eyes daily. 12/29/19  Yes [provider]  potassium chloride  (KLOR-CON ) 10 MEQ tablet Take 2 tablets (20 mEq total) by mouth daily. Patient taking differently: Take 10 mEq by mouth daily. 03/13/23  Yes Lesia Ozell Barter, PA-C  pregabalin  (LYRICA ) 150 MG capsule Take 150 mg  by mouth See admin instructions. Take 150 mg by mouth two times a day and an additional 150 mg once day as needed for unresolved neuropathic pain 11/15/15  Yes [provider]  PROCTOFOAM [facility] rectal foam Place 1 applicator rectally 3 (three) times daily as needed for hemorrhoids or anal itching.   Yes [provider]  RESTASIS  MULTIDOSE 0.05 % ophthalmic emulsion Place 1 drop into both eyes in the morning and at bedtime. 03/12/20  Yes [provider]  rosuvastatin  (CRESTOR ) 20 MG  tablet Take 20 mg by mouth daily.   Yes [provider]  senna (SENOKOT) 8.6 MG TABS tablet Take 2 tablets by mouth at bedtime.   Yes [provider]  SYNTHROID  25 MCG tablet Take 25 mcg by mouth daily before breakfast.   Yes [provider]  tiZANidine  (ZANAFLEX ) 4 MG tablet Take 8 mg by mouth at bedtime. 12/26/19  Yes [provider]  TYLENOL  500 MG tablet Take 500-1,000 mg by mouth every 6 (six) hours as needed for mild pain (pain score 1-3) or headache.   Yes [provider]  Vitamin D , Ergocalciferol , (DRISDOL ) 50000 units CAPS capsule Take 50,000 Units by mouth every Wednesday.  03/16/16  Yes [provider]  NAPOLEON INHUB 250-50 MCG/ACT AEPB Inhale 1 puff into the lungs in the morning and at bedtime.   Yes [provider]  naloxone  (NARCAN ) nasal spray 4 mg/0.1 mL Take 1 spray into nostril(s) every 3 minutes until patient awakes or EMS arrives. Patient not taking: Reported on 11/10/2023 10/30/23     spironolactone  (ALDACTONE ) 25 MG tablet TAKE 1 TABLET (25 MG TOTAL) BY MOUTH DAILY. 11/11/23   Lesia Ozell Barter, PA-C     Vital Signs: BP 96/60 (BP Location: Left Arm)   Pulse 63   Temp 97.6 F (36.4 C) (Oral)   Resp 16   Ht 5' 9 (1.753 m)   Wt 254 lb 3.1 oz (115.3 kg)   LMP  (LMP Unknown)   SpO2 92%   BMI 37.54 kg/m   Physical Exam Vitals reviewed.  Constitutional:      General: She is not in acute distress.    Appearance: She is not ill-appearing.  HENT:     Head: Normocephalic and atraumatic.  Pulmonary:     Effort: Pulmonary effort is normal.  Abdominal:     General: Abdomen is flat.     Palpations: Abdomen is soft.     Comments: Positive RLQ drain to a gravity bag. Site is unremarkable with no erythema, edema, tenderness, bleeding or drainage. Suture and stat lock in place. Dressing is clean, dry, and intact. Gravity bag with only a trace amount of purulent appearing material after recent emptying.   Skin:     General: Skin is warm and dry.     Coloration: Skin is not cyanotic or jaundiced.  Neurological:     Mental Status: She is alert.     Imaging: CT GUIDED PERITONEAL/RETROPERITONEAL FLUID DRAIN BY [facility] CATH Result Date: 11/11/2023 INDICATION: [id number] Perforated appendicitis [id number] EXAM: CT-GUIDED ABSCESS DRAIN PLACEMENT FOR RIGHT LOWER QUADRANT ABSCESS COMPARISON:  OSH CT AP on [facility] PACS 11/10/2023. MEDICATIONS: The patient is currently admitted to the hospital and receiving intravenous antibiotics. The antibiotics were administered within an appropriate time frame prior to the initiation of the procedure. 25 mg Benadryl  IV was administered. ANESTHESIA/SEDATION: Moderate (conscious) sedation was employed during this procedure. A total of Versed  3 mg and Fentanyl  100 mcg was administered intravenously. Moderate  Sedation Time: 22 minutes. The patient's level of consciousness and vital signs were monitored continuously by radiology nursing throughout the procedure under my direct supervision. CONTRAST:  None COMPLICATIONS: None immediate. PROCEDURE: RADIATION DOSE REDUCTION: This exam was performed according to the departmental dose-optimization program which includes automated exposure control, adjustment of the mA and/or kV according to patient size and/or use of iterative reconstruction technique. Informed written consent was obtained from the patient and/or patient's representative after a discussion of the risks, benefits and alternatives to treatment. The patient was placed supine on the CT gantry and a pre procedural CT was performed re-demonstrating the known abscess/fluid collection within the RIGHT lower quadrant. The procedure was planned. A timeout was performed prior to the initiation of the procedure. The RIGHT lower quadrant was prepped and draped in the usual sterile fashion. The overlying soft tissues were anesthetized with 1% lidocaine  with epinephrine . Appropriate trajectory was planned with the  use of a 22 gauge spinal needle. An 18 gauge trocar needle was advanced into the abscess/fluid collection and a short Amplatz super stiff wire was coiled within the collection. Appropriate positioning was confirmed with a limited CT scan. The tract was serially dilated allowing placement of a 8 Fr drainage catheter. Appropriate positioning was confirmed with a limited postprocedural CT scan. 5 mL of purulent fluid was aspirated. The tube was connected to a drainage bag and sutured in place. A dressing was placed. The patient tolerated the procedure well without immediate post procedural complication. IMPRESSION: Successful CT guided placement of a 8 Fr drainage catheter into a RIGHT lower quadrant abscess with aspiration of 5 mL of purulent fluid. Samples were sent to the laboratory as requested by the ordering clinical team. RECOMMENDATIONS: The patient will return to Vascular Interventional Radiology (VIR) for routine drainage catheter evaluation in 10-14 days. Thom Hall, MD [facility] Radiology Electronically Signed   By: Thom Hall M.D.   On: 11/11/2023 16:19   US  Abdomen Limited RUQ (LIVER/GB) Result Date: 11/10/2023 CLINICAL DATA:  Cholelithiasis EXAM: ULTRASOUND ABDOMEN LIMITED RIGHT UPPER QUADRANT COMPARISON:  None Available. FINDINGS: Gallbladder: A negative sonographic Beverley sign was reported by the sonographer. Cholelithiasis measuring up to 1.7 cm. No pericholecystic fluid or gallbladder wall thickening. Common bile duct: Diameter: 7 mm Liver: Coarse hepatic echotexture with nodular contour. Portal vein is patent on color Doppler imaging with normal direction of blood flow towards the liver. Other: None. IMPRESSION: 1. Cholelithiasis without evidence of acute cholecystitis. 2. Cirrhosis. Electronically Signed   By: Franky Stanford M.D.   On: 11/10/2023 21:29    Labs:  CBC: Recent Labs    11/10/23 1730 11/11/23 0630 11/12/23 0722  11/13/23 0608  WBC 6.3 6.0 5.9 5.1  HGB 13.4 12.5 11.4* 11.4*  HCT 40.9 38.9 35.1* 36.0  PLT 126* 124* 121* 151    COAGS: Recent Labs    11/11/23 0802  INR 1.4*    BMP: Recent Labs    11/10/23 1730 11/11/23 0630 11/12/23 0722 11/13/23 0608  NA 131* 133* 130* 134*  K 3.3* 4.4 4.1 3.8  CL 96* 98 97* 103  CO2 21* 25 22 22   GLUCOSE 221* 214* 207* 200*  BUN 11 9 9 9   CALCIUM  9.3 9.3 8.6* 8.8*  CREATININE 1.11* 1.00 1.29* 1.01*  GFRNONAA 56* >60 47* >60    LIVER FUNCTION TESTS: Recent Labs    11/10/23 1730 11/11/23 0630  BILITOT 0.8 1.0  AST 33 62*  ALT 28 38  ALKPHOS  125 132*  PROT 7.4 6.8  ALBUMIN 3.1* 2.8*    Assessment and Plan:  63 y.o. female with pef'd appy s/p RLQ drain placement by Dr. Hughes on 11/11/23  Drain Location: RLQ Size: 8 Fr Date of placement: 11/11/23  Currently to: Drain collection device: gravity 24 hour output:  Output by Drain (mL) 11/11/23 0701 - 11/11/23 1900 11/11/23 1901 - 11/12/23 0700 11/12/23 0701 - 11/12/23 1900 11/12/23 1901 - 11/13/23 0700 11/13/23 0701 - 11/13/23 1617  Closed System Drain Right;Anterior Abdomen Other (Comment) 8 Fr.    20 25    Interval imaging/drain manipulation:  None   Current examination: Flushes/aspirates easily.  Insertion site unremarkable. Suture and stat lock in place. Dressed appropriately.  25 mL documented.   Plan: Continue TID flushes with 5 cc NS. Record output Q shift. Dressing changes QD or PRN if soiled.   Discharge planning: Have placed outpatient orders in the event patient able to discharge home soon.  IR scheduler will contact patient with date/time of appointment. Patient will need to flush drain QD with 5 cc NS, record output QD, dressing changes every 2-3 days or earlier if soiled.   IR will continue to follow - please call with questions or concerns.  Electronically Signed: Shaneika Rossa Sue-Ellen Tareva Leske, PA 11/13/2023, 4:17 PM   I spent a total of 15 Minutes at the the  patient's bedside AND on the patient's hospital floor or unit, greater than 50% of which was counseling/coordinating care for periappendiceal abscess.

## 2023-11-13 NOTE — Progress Notes (Addendum)
 Pt taught how to flush drain and empty drain.  Pt demonstrated flushing drain with nurse.  Successfully done.

## 2023-11-13 NOTE — Progress Notes (Signed)
 Progress Note     Subjective: Pt reports pain well controlled on current regimen. Tolerating diet and denies nausea or vomiting. Passing flatus but no BM, has some constipation at baseline in setting of chronic pain meds.   Objective: Vital signs in last 24 hours: Temp:  [97.7 F (36.5 C)-99.1 F (37.3 C)] 97.7 F (36.5 C) (02/07 0557) Pulse Rate:  [65-69] 66 (02/07 0557) Resp:  [16-18] 18 (02/07 0557) BP: (87-105)/(46-61) 91/61 (02/07 0557) SpO2:  [92 %-99 %] 94 % (02/07 0557) Last BM Date : 11/11/23  Intake/Output from previous day: 02/06 0701 - 02/07 0700 In: 1420.2 [P.O.:480; I.V.:580.2; IV Piggyback:350] Out: 20 [Drains:20] Intake/Output this shift: No intake/output data recorded.  PE: General: pleasant, WD, obese female who is laying in bed in NAD HEENT: sclera anicteric  Lungs:  Respiratory effort nonlabored Abd: soft, mild ttp in RLQ over drain, no peritonitis, drain with seropurulent fluid Psych: A&Ox3 with an appropriate affect.    Lab Results:  Recent Labs    11/12/23 0722 11/13/23 0608  WBC 5.9 5.1  HGB 11.4* 11.4*  HCT 35.1* 36.0  PLT 121* 151   BMET Recent Labs    11/12/23 0722 11/13/23 0608  NA 130* 134*  K 4.1 3.8  CL 97* 103  CO2 22 22  GLUCOSE 207* 200*  BUN 9 9  CREATININE 1.29* 1.01*  CALCIUM  8.6* 8.8*   PT/INR Recent Labs    11/11/23 0802  LABPROT 17.5*  INR 1.4*   CMP     Component Value Date/Time   NA 134 (L) 11/13/2023 0608   NA 139 08/31/2023 1149   K 3.8 11/13/2023 0608   CL 103 11/13/2023 0608   CO2 22 11/13/2023 0608   GLUCOSE 200 (H) 11/13/2023 0608   BUN 9 11/13/2023 0608   BUN 22 08/31/2023 1149   CREATININE 1.01 (H) 11/13/2023 0608   CALCIUM  8.8 (L) 11/13/2023 0608   PROT 6.8 11/11/2023 0630   ALBUMIN 2.8 (L) 11/11/2023 0630   AST 62 (H) 11/11/2023 0630   ALT 38 11/11/2023 0630   ALKPHOS 132 (H) 11/11/2023 0630   BILITOT 1.0 11/11/2023 0630   GFRNONAA >60 11/13/2023 0608   GFRAA 86 09/18/2020 1606    Lipase     Component Value Date/Time   LIPASE 24 11/10/2023 1730       Studies/Results: CT GUIDED PERITONEAL/RETROPERITONEAL FLUID DRAIN BY PERC CATH Result Date: 11/11/2023 INDICATION: [id number] Perforated appendicitis [id number] EXAM: CT-GUIDED ABSCESS DRAIN PLACEMENT FOR RIGHT LOWER QUADRANT ABSCESS COMPARISON:  OSH CT AP on [facility] PACS 11/10/2023. MEDICATIONS: The patient is currently admitted to the hospital and receiving intravenous antibiotics. The antibiotics were administered within an appropriate time frame prior to the initiation of the procedure. 25 mg Benadryl  IV was administered. ANESTHESIA/SEDATION: Moderate (conscious) sedation was employed during this procedure. A total of Versed  3 mg and Fentanyl  100 mcg was administered intravenously. Moderate Sedation Time: 22 minutes. The patient's level of consciousness and vital signs were monitored continuously by radiology nursing throughout the procedure under my direct supervision. CONTRAST:  None COMPLICATIONS: None immediate. PROCEDURE: RADIATION DOSE REDUCTION: This exam was performed according to the departmental dose-optimization program which includes automated exposure control, adjustment of the mA and/or kV according to patient size and/or use of iterative reconstruction technique. Informed written consent was obtained from the patient and/or patient's representative after a discussion of the risks, benefits and alternatives to treatment. The patient was placed supine on the CT gantry and a pre procedural CT was  performed re-demonstrating the known abscess/fluid collection within the RIGHT lower quadrant. The procedure was planned. A timeout was performed prior to the initiation of the procedure. The RIGHT lower quadrant was prepped and draped in the usual sterile fashion. The overlying soft tissues were anesthetized with 1% lidocaine  with epinephrine . Appropriate trajectory was planned with the use of a 22 gauge spinal needle. An 18 gauge  trocar needle was advanced into the abscess/fluid collection and a short Amplatz super stiff wire was coiled within the collection. Appropriate positioning was confirmed with a limited CT scan. The tract was serially dilated allowing placement of a 8 Fr drainage catheter. Appropriate positioning was confirmed with a limited postprocedural CT scan. 5 mL of purulent fluid was aspirated. The tube was connected to a drainage bag and sutured in place. A dressing was placed. The patient tolerated the procedure well without immediate post procedural complication. IMPRESSION: Successful CT guided placement of a 8 Fr drainage catheter into a RIGHT lower quadrant abscess with aspiration of 5 mL of purulent fluid. Samples were sent to the laboratory as requested by the ordering clinical team. RECOMMENDATIONS: The patient will return to Vascular Interventional Radiology (VIR) for routine drainage catheter evaluation in 10-14 days. Thom Hall, MD [facility] Radiology Electronically Signed   By: Thom Hall M.D.   On: 11/11/2023 16:19    Anti-infectives: Anti-infectives (From admission, onward)    Start     Dose/Rate Route Frequency Ordered Stop   11/11/23 0100  piperacillin -tazobactam (ZOSYN ) IVPB 3.375 g        3.375 g 12.5 mL/hr over 240 Minutes Intravenous Every 8 hours 11/10/23 2027     11/10/23 1730  piperacillin -tazobactam (ZOSYN ) IVPB 3.375 g        3.375 g 100 mL/hr over 30 Minutes Intravenous  Once 11/10/23 1728 11/10/23 1842        Assessment/Plan Perforated appendicitis with abscess - perc drain of appendiceal abscess completed by IR - Cx with E.coli resistant to ampicillin - ok to transition to PO abx from surgery standpoint, would recommend total course of 10 days  - tolerating diet, adding colace and miralax  for bowel regimen - stable for DC from surgery standpoint when she has a BM, will arrange follow up in [facility] clinic to discuss eventual  interval appendectomy although may need referral to tertiary center for this given cirrhosis  - IR should arrange outpatient follow up in drain clinic, I have asked RN to go over emptying and flushing drain  - general surgery will not see over the weekend but are available if acute issues arise    FEN: HH diet VTE: eliquis  ID: Zosyn  2/4>>  - per TRH -  Cholelithiasis - known gallstones, asymptomatic Perirectal inflammation - no clinical signs of abscess and patient has no perirectal pain ETOH abuse - would recommend CIWA Cirrhosis Tobacco abuse Tachy-brady syndrome Paroxsymal A fib - back on DOAC HTN HLD Hypothyroidism Chronic back pain with stimulator on chronic narcotics   LOS: 3 days   I reviewed Consultant IR notes, hospitalist notes, last 24 h vitals and pain scores, last 48 h intake and output, last 24 h labs and trends, and last 24 h imaging results.  This care required moderate level of medical decision making.    Burnard JONELLE Louder, [facility] Surgery 11/13/2023, 10:24 AM Please see Amion for pager number during day hours 7:00am-4:30pm

## 2023-11-13 NOTE — Progress Notes (Signed)
 PROGRESS NOTE    [name]  FMW:[id number] DOB: 01-14-1961 DOA: 11/10/2023 PCP: Debrah Josette ORN., PA-C    Brief Narrative:  63 year old with history of paroxysmal A-fib status post ablation on Eliquis , CHF with reduced EF, tachybradycardia syndrome status post pacemaker, cirrhosis, HTN, HLD, hypothyroidism, chronic lower back pain status post spinal stimulator, chronic narcotic use, tobacco use admitted for acute perforated appendicitis and periappendiceal abscess.  Patient seen by general surgery and IR.  Had drainage catheter placed by IR on 2/5.   Assessment & Plan:  Principal Problem:   Acute perforated appendicitis Active Problems:   Cholelithiasis   Thrombocytopenia (HCC)   Paroxysmal atrial fibrillation (HCC)   Benign essential hypertension   Tobacco use   Chronic pain syndrome   Tachy-brady syndrome (HCC)   Cirrhosis (HCC)   Chronic heart failure with mildly reduced ejection fraction (HFmrEF, 41-49%) (HCC)   COPD (chronic obstructive pulmonary disease) (HCC)   Hypothyroidism   Abscess, periappendiceal   Acute perforated appendicitis with contained periappendiceal abscess: -Noted on outpatient CT scan in care everywhere.  Seen by general surgery and IR.  Had drainage catheter placed on 2/5.   -Overall she reports that her abdomen is improving.  Minimal residual pain. -Has not yet had a bowel movement. -Plan will be to transition to oral antibiotics today.  Will watch overnight. -Surgery has signed off and states that she is ready for discharge from surgical standpoint after she has had a BM. -Will need to have RN review emptying flushing drain. -IR to arrange for outpatient follow-up and drain clinic.   -Overall believe she is likely ready to discharge home tomorrow.   Cholelithiasis without evidence of cholecystitis -Ultrasound performed.  Unfortunately spinal stimulator is not compatible with MRI.  LFTs are normal and asymptomatic.  Clinically monitor    Low  blood pressure History of essential hypertension - Combination of multiple medications.  Will discontinue home Coreg, Aldactone , losartan  and HCTZ for now.  Will give very gentle hydration, midodrine if needed     Paroxysmal atrial fibrillation/flutter s/p ablation Tachy-brady syndrome s/p PPM: Stable. On Toprol -XL 150 mg daily outpt, held due to relative hypotension.   -On apixaban    HFmrEF: EF 45% Stable, euvolemic on admission.  EF 45-50% by TTE 04/29/2023. -Continue Toprol , Aldactone  and losartan  at discharge   COPD: As needed bronchodilators   Cirrhosis: History of alcohol use -Alcohol withdrawal protocol   Hypothyroidism: Continue Synthroid .   Hyperlipidemia: Continue rosuvastatin .   Thrombocytopenia: Mild in setting of cirrhosis.  No obvious bleeding.   Chronic back pain: S/p spinal cord stimulator.  Continue Lyrica  and Percocet prn.   Tobacco use: Patient reports smoking 1 pack/day.  Nicotine  patch provided.  Smoking cessation advised.  Alcohol abuse: -She was previously on Librium .  She is now day 4 hospitalization with no issues of tremor or hallucinations.  DTs not likely at this point..  Will continue to monitor -Social work consulted but patient  declined -As needed Ativan  on board per CIWA protocol.     DVT prophylaxis: SCDs Start: 11/10/23 2017    Code Status: Full Code Family Communication:    Status is: Inpatient Remains inpatient appropriate because: Continue hospital stay for management of intra-abdominal symptoms       Subjective: No complaints today.  Eating and drinking well.  Still with some residual tenderness around drainage site.  She has had some continued purulent output from her drain.  Able to ambulate.  No fevers or chills.  Examination:  General exam: Appears calm and comfortable  Respiratory system: Clear to auscultation. Respiratory effort normal. Cardiovascular system: S1 & S2 heard, RRR. No JVD, murmurs, rubs, gallops or  clicks. No pedal edema. Gastrointestinal system: Soft, obese, mild tenderness palpation right lower quadrant around drainage site.  Drain with seropurulent fluid.  No guarding or rebound or rigidity. Central nervous system: Alert and oriented. No focal neurological deficits. Extremities: Symmetric 5 x 5 power. Skin: No rashes, lesions or ulcers Psychiatry: Judgement and insight appear normal. Mood & affect appropriate.      Diet Orders (From admission, onward)     Start     Ordered   11/13/23 0000  Diet - low sodium heart healthy        11/13/23 1518   11/11/23 1616  Diet Heart Room service appropriate? Yes; Fluid consistency: Thin  Diet effective now       Question Answer Comment  Room service appropriate? Yes   Fluid consistency: Thin      11/11/23 1616            Objective: Vitals:   11/12/23 1814 11/12/23 2310 11/13/23 0557 11/13/23 1333  BP: (!) 102/58 (!) 87/58 91/61 96/60   Pulse: 68 65 66 63  Resp: 16 18 18 16   Temp: 98.7 F (37.1 C) 97.9 F (36.6 C) 97.7 F (36.5 C) 97.6 F (36.4 C)  TempSrc: Oral Oral Oral Oral  SpO2: 99% 92% 94% 92%  Weight:      Height:        Intake/Output Summary (Last 24 hours) at 11/13/2023 1550 Last data filed at 11/13/2023 1509 Gross per 24 hour  Intake 65 ml  Output 45 ml  Net 20 ml   Filed Weights   11/10/23 1710 11/12/23 0500  Weight: 114.8 kg 115.3 kg    Scheduled Meds:  apixaban   5 mg Oral BID   cycloSPORINE   1 drop Both Eyes BID   docusate sodium   100 mg Oral BID   famotidine   20 mg Oral BID   folic acid   1 mg Oral Daily   HYDROmorphone   4 mg Oral Q4H   levothyroxine   25 mcg Oral Q0600   mometasone -formoterol   2 puff Inhalation BID   multivitamin with minerals  1 tablet Oral Daily   nicotine   21 mg Transdermal Daily   polyethylene glycol  17 g Oral Daily   potassium chloride   10 mEq Oral Daily   pregabalin   150 mg Oral BID   rosuvastatin   20 mg Oral Daily   senna  2 tablet Oral QHS   sodium chloride  flush  3  mL Intravenous Q12H   sodium chloride  flush  5 mL Intracatheter Q8H   thiamine   100 mg Oral Daily   Or   thiamine   100 mg Intravenous Daily   tiZANidine   8 mg Oral QHS   Continuous Infusions:  piperacillin -tazobactam (ZOSYN )  IV 3.375 g (11/13/23 1020)   sodium chloride       Nutritional status     Body mass index is 37.54 kg/m.  Data Reviewed:   CBC: Recent Labs  Lab 11/10/23 1730 11/11/23 0630 11/12/23 0722 11/13/23 0608  WBC 6.3 6.0 5.9 5.1  NEUTROABS 4.2  --   --   --   HGB 13.4 12.5 11.4* 11.4*  HCT 40.9 38.9 35.1* 36.0  MCV 101.2* 103.2* 101.4* 103.2*  PLT 126* 124* 121* 151   Basic Metabolic Panel: Recent Labs  Lab 11/10/23 1730 11/11/23 0630 11/12/23 0722 11/13/23 0608  NA  131* 133* 130* 134*  K 3.3* 4.4 4.1 3.8  CL 96* 98 97* 103  CO2 21* 25 22 22   GLUCOSE 221* 214* 207* 200*  BUN 11 9 9 9   CREATININE 1.11* 1.00 1.29* 1.01*  CALCIUM  9.3 9.3 8.6* 8.8*  MG  --  2.3 2.1 2.2  PHOS  --   --  2.6  --    GFR: Estimated Creatinine Clearance: 78.2 mL/min (A) (by C-G formula based on SCr of 1.01 mg/dL (H)). Liver Function Tests: Recent Labs  Lab 11/10/23 1730 11/11/23 0630  AST 33 62*  ALT 28 38  ALKPHOS 125 132*  BILITOT 0.8 1.0  PROT 7.4 6.8  ALBUMIN 3.1* 2.8*   Recent Labs  Lab 11/10/23 1730  LIPASE 24   No results for input(s): AMMONIA in the last 168 hours. Coagulation Profile: Recent Labs  Lab 11/11/23 0802  INR 1.4*   Cardiac Enzymes: No results for input(s): CKTOTAL, CKMB, CKMBINDEX, TROPONINI in the last 168 hours. BNP (last 3 results) Recent Labs    07/22/23 1243 08/31/23 1149  PROBNP 331* 457*   HbA1C: No results for input(s): HGBA1C in the last 72 hours. CBG: No results for input(s): GLUCAP in the last 168 hours. Lipid Profile: No results for input(s): CHOL, HDL, LDLCALC, TRIG, CHOLHDL, LDLDIRECT in the last 72 hours. Thyroid Function Tests: No results for input(s): TSH, T4TOTAL,  FREET4, T3FREE, THYROIDAB in the last 72 hours. Anemia Panel: No results for input(s): VITAMINB12, FOLATE, FERRITIN, TIBC, IRON, RETICCTPCT in the last 72 hours. Sepsis Labs: No results for input(s): PROCALCITON, LATICACIDVEN in the last 168 hours.  Recent Results (from the past 240 hours)  Aerobic/Anaerobic Culture w Gram Stain (surgical/deep wound)     Status: None (Preliminary result)   Collection Time: 11/11/23  4:11 PM   Specimen: Abscess  Result Value Ref Range Status   Specimen Description ABSCESS ABDOMEN  Final   Special Requests RLQ  Final   Gram Stain   Final    MODERATE WBC PRESENT, PREDOMINANTLY PMN MODERATE GRAM NEGATIVE RODS MODERATE GRAM POSITIVE RODS    Culture   Final    ABUNDANT ESCHERICHIA COLI HOLDING FOR POSSIBLE ANAEROBE Performed at [facility], [address], [place], [place] [address]    Report Status PENDING  Incomplete   Organism ID, Bacteria ESCHERICHIA COLI  Final      Susceptibility   Escherichia coli - MIC*    AMPICILLIN >=32 RESISTANT Resistant     CEFEPIME <=0.12 SENSITIVE Sensitive     CEFTAZIDIME <=1 SENSITIVE Sensitive     CEFTRIAXONE <=0.25 SENSITIVE Sensitive     CIPROFLOXACIN <=0.25 SENSITIVE Sensitive     GENTAMICIN  <=1 SENSITIVE Sensitive     IMIPENEM <=0.25 SENSITIVE Sensitive     TRIMETH/SULFA <=20 SENSITIVE Sensitive     AMPICILLIN/SULBACTAM >=32 RESISTANT Resistant     PIP/TAZO <=4 SENSITIVE Sensitive ug/mL    * ABUNDANT ESCHERICHIA COLI         Radiology Studies: CT GUIDED PERITONEAL/RETROPERITONEAL FLUID DRAIN BY PERC CATH Result Date: 11/11/2023 INDICATION: [id number] Perforated appendicitis [id number] EXAM: CT-GUIDED ABSCESS DRAIN PLACEMENT FOR RIGHT LOWER QUADRANT ABSCESS COMPARISON:  OS[facility] CT AP on [facility] PACS 11/10/2023. MEDICATIONS: The patient is currently admitted to the hospital and receiving intravenous antibiotics. The antibiotics were administered within an appropriate time frame prior to the  initiation of the procedure. 25 mg Benadryl  IV was administered. ANESTHESIA/SEDATION: Moderate (conscious) sedation was employed during this procedure. A total of Versed  3 mg and  Fentanyl  100 mcg was administered intravenously. Moderate Sedation Time: 22 minutes. The patient's level of consciousness and vital signs were monitored continuously by radiology nursing throughout the procedure under my direct supervision. CONTRAST:  None COMPLICATIONS: None immediate. PROCEDURE: RADIATION DOSE REDUCTION: This exam was performed according to the departmental dose-optimization program which includes automated exposure control, adjustment of the mA and/or kV according to patient size and/or use of iterative reconstruction technique. Informed written consent was obtained from the patient and/or patient's representative after a discussion of the risks, benefits and alternatives to treatment. The patient was placed supine on the CT gantry and a pre procedural CT was performed re-demonstrating the known abscess/fluid collection within the RIGHT lower quadrant. The procedure was planned. A timeout was performed prior to the initiation of the procedure. The RIGHT lower quadrant was prepped and draped in the usual sterile fashion. The overlying soft tissues were anesthetized with 1% lidocaine  with epinephrine . Appropriate trajectory was planned with the use of a 22 gauge spinal needle. An 18 gauge trocar needle was advanced into the abscess/fluid collection and a short Amplatz super stiff wire was coiled within the collection. Appropriate positioning was confirmed with a limited CT scan. The tract was serially dilated allowing placement of a 8 Fr drainage catheter. Appropriate positioning was confirmed with a limited postprocedural CT scan. 5 mL of purulent fluid was aspirated. The tube was connected to a drainage bag and sutured in place. A dressing was placed. The patient tolerated the procedure well without immediate post  procedural complication. IMPRESSION: Successful CT guided placement of a 8 Fr drainage catheter into a RIGHT lower quadrant abscess with aspiration of 5 mL of purulent fluid. Samples were sent to the laboratory as requested by the ordering clinical team. RECOMMENDATIONS: The patient will return to Vascular Interventional Radiology (VIR) for routine drainage catheter evaluation in 10-14 days. Thom Hall, MD [facility] Radiology Electronically Signed   By: Thom Hall M.D.   On: 11/11/2023 16:19       LOS: 3 days   Time spent= 35 mins    Reyes VEAR Gaw, MD [facility]  If 7PM-7AM, please contact night-coverage  11/13/2023, 3:50 PM

## 2023-11-13 NOTE — TOC CAGE-AID Note (Signed)
 Transition of Care [facility]) - CAGE-AID Screening   Patient Details  Name: [name] MRN: [id number] Date of Birth: Oct 03, 1961  Transition of Care [facility]) CM/SW Contact:    Toy LITTIE Agar, RN Phone Number: 11/13/2023, 3:29 PM   Clinical Narrative:  refused  CAGE-AID Screening:

## 2023-11-14 ENCOUNTER — Other Ambulatory Visit (HOSPITAL_COMMUNITY): Payer: Self-pay

## 2023-11-14 DIAGNOSIS — K3532 Acute appendicitis with perforation and localized peritonitis, without abscess: Secondary | ICD-10-CM | POA: Diagnosis not present

## 2023-11-14 LAB — CBC
HCT: 33.4 % — ABNORMAL LOW (ref 36.0–46.0)
Hemoglobin: 10.9 g/dL — ABNORMAL LOW (ref 12.0–15.0)
MCH: 33.9 pg (ref 26.0–34.0)
MCHC: 32.6 g/dL (ref 30.0–36.0)
MCV: 103.7 fL — ABNORMAL HIGH (ref 80.0–100.0)
Platelets: 159 10*3/uL (ref 150–400)
RBC: 3.22 MIL/uL — ABNORMAL LOW (ref 3.87–5.11)
RDW: 14.8 % (ref 11.5–15.5)
WBC: 4.4 10*3/uL (ref 4.0–10.5)
nRBC: 0 % (ref 0.0–0.2)

## 2023-11-14 LAB — BASIC METABOLIC PANEL
Anion gap: 9 (ref 5–15)
BUN: 7 mg/dL — ABNORMAL LOW (ref 8–23)
CO2: 24 mmol/L (ref 22–32)
Calcium: 8.8 mg/dL — ABNORMAL LOW (ref 8.9–10.3)
Chloride: 103 mmol/L (ref 98–111)
Creatinine, Ser: 0.83 mg/dL (ref 0.44–1.00)
GFR, Estimated: >60 mL/min (ref >60)
Glucose, Bld: 205 mg/dL — ABNORMAL HIGH (ref 70–99)
Potassium: 3.8 mmol/L (ref 3.5–5.1)
Sodium: 136 mmol/L (ref 135–145)

## 2023-11-14 LAB — AEROBIC/ANAEROBIC CULTURE W GRAM STAIN (SURGICAL/DEEP WOUND)

## 2023-11-14 LAB — MAGNESIUM: Magnesium: 2.1 mg/dL (ref 1.7–2.4)

## 2023-11-14 MED ORDER — AMOXICILLIN-POT CLAVULANATE 875-125 MG PO TABS
1.0000 | ORAL_TABLET | Freq: Two times a day (BID) | ORAL | 1 refills | Status: DC
  Filled 2023-11-14: qty 14, 7d supply, fill #0

## 2023-11-14 NOTE — Progress Notes (Signed)
   Subjective/Chief Complaint: No complaints   Objective: Vital signs in last 24 hours: Temp:  [97.3 F (36.3 C)-97.7 F (36.5 C)] 97.7 F (36.5 C) (02/08 0534) Pulse Rate:  [63-66] 63 (02/08 0534) Resp:  [16-20] 18 (02/08 0534) BP: (96-127)/(60-74) 127/74 (02/08 0534) SpO2:  [92 %-98 %] 94 % (02/08 0753) Last BM Date : 11/11/23  Intake/Output from previous day: 02/07 0701 - 02/08 0700 In: 255 [P.O.:240] Out: 35 [Drains:35] Intake/Output this shift: Total I/O In: 245 [P.O.:240; Other:5] Out: -   General appearance: alert and cooperative Resp: clear to auscultation bilaterally Cardio: regular rate and rhythm GI: soft, nontender. Drain intact  Lab Results:  Recent Labs    11/13/23 0608 11/14/23 0744  WBC 5.1 4.4  HGB 11.4* 10.9*  HCT 36.0 33.4*  PLT 151 159   BMET Recent Labs    11/13/23 0608 11/14/23 0744  NA 134* 136  K 3.8 3.8  CL 103 103  CO2 22 24  GLUCOSE 200* 205*  BUN 9 7*  CREATININE 1.01* 0.83  CALCIUM  8.8* 8.8*   PT/INR No results for input(s): LABPROT, INR in the last 72 hours. ABG No results for input(s): PHART, HCO3 in the last 72 hours.  Invalid input(s): PCO2, PO2  Studies/Results: No results found.  Anti-infectives: Anti-infectives (From admission, onward)    Start     Dose/Rate Route Frequency Ordered Stop   11/11/23 0100  piperacillin -tazobactam (ZOSYN ) IVPB 3.375 g        3.375 g 12.5 mL/hr over 240 Minutes Intravenous Every 8 hours 11/10/23 2027     11/10/23 1730  piperacillin -tazobactam (ZOSYN ) IVPB 3.375 g        3.375 g 100 mL/hr over 30 Minutes Intravenous  Once 11/10/23 1728 11/10/23 1842       Assessment/Plan: s/p * No surgery found * Advance diet Continue oral abx Will need follow up in drain and clinic as well as surgery clinic [facility] for d/c from surgery standpoint  LOS: 4 days    Deward Null III 11/14/2023

## 2023-11-14 NOTE — Progress Notes (Signed)
 Pt discharged home with all belongings. Drain dressing changed with pt. Educated pt on s/s of infection to monitor for at home. Reinforced education on flushing drain with proper technique as well as recording output from drain. All discharge medications reviewed with pt. Rx delivered to pt from outpt pharmacy and sent with pt at time of discharge. No questions or concerns at this time. Pt to schedule follow up appt on Monday.

## 2023-11-14 NOTE — Discharge Summary (Signed)
 Physician Discharge Summary   Patient: [name] MRN: [id number] DOB: [birth year]  Admit date:     11/10/2023  Discharge date: 11/14/23  Discharge Physician: Reyes VEAR Gaw   PCP: Debrah Josette ORN., PA-C   Recommendations at discharge:   Ensure follow-up with both general surgery and drain clinic with IR. Patient was discharged home on Augmentin .  However after patient discharged her microbiology results returned which grew both E. coli and Bacteroides.  E. coli was resistant to amoxicillin  and Augmentin .  Antibiotics were therefore switched to Cipro/Flagyl each 500 mg p.o. twice daily for the next 5 days.  See below for details Did have cholelithiasis noted on imaging.  Asymptomatic.  Follow-up outpatient. Did have episode of hypotension on first day of admission.  Blood pressure medicines were held.  Ensure that blood pressure remains good at that she has resumed her home blood pressure medicines at follow-up  Discharge Diagnoses: Principal Problem:   Acute perforated appendicitis Active Problems:   Cholelithiasis   Thrombocytopenia (HCC)   Paroxysmal atrial fibrillation (HCC)   Benign essential hypertension   Tobacco use   Chronic pain syndrome   Tachy-brady syndrome (HCC)   Cirrhosis (HCC)   Chronic heart failure with mildly reduced ejection fraction (HFmrEF, 41-49%) (HCC)   COPD (chronic obstructive pulmonary disease) (HCC)   Hypothyroidism   Abscess, periappendiceal  Resolved Problems:   * No resolved hospital problems. *  Brief Narrative:  63 year old with history of paroxysmal A-fib status post ablation on Eliquis , CHF with reduced EF, tachybradycardia syndrome status post pacemaker, cirrhosis, HTN, HLD, hypothyroidism, chronic lower back pain status post spinal stimulator, chronic narcotic use, tobacco use admitted for acute perforated appendicitis and periappendiceal abscess.  Patient seen by general surgery and IR.  Had drainage catheter placed by IR on 2/5.  Did  well with good drainage.  Surgery and IR followed throughout hospital course due to persistent improvement she was discharged home on 2/8.  Ambulating well, eating well, good bowel movement day of discharge   Assessment & Plan:  Principal Problem:   Acute perforated appendicitis Active Problems:   Cholelithiasis   Thrombocytopenia (HCC)   Paroxysmal atrial fibrillation (HCC)   Benign essential hypertension   Tobacco use   Chronic pain syndrome   Tachy-brady syndrome (HCC)   Cirrhosis (HCC)   Chronic heart failure with mildly reduced ejection fraction (HFmrEF, 41-49%) (HCC)   COPD (chronic obstructive pulmonary disease) (HCC)   Hypothyroidism   Abscess, periappendiceal   Acute perforated appendicitis with contained periappendiceal abscess: -Noted on outpatient CT scan in care everywhere.  Seen by general surgery and IR.  Had drainage catheter placed on 2/5.   -IR to arrange for outpatient follow-up and drain clinic.   -Overall believe she is likely ready to discharge home tomorrow.  Positive wound cultures: -After patient been discharged patient's microbiology returned positive for both Bacteroides and Augmentin  resistant E. coli. -Discussed with on-call pharmacist. -Plan therefore to switch her to Cipro as E. coli was sensitive to this and Flagyl, which would cover her Bacteroides. -Does have source control so prescribed 5 days total.   Cholelithiasis without evidence of cholecystitis -Ultrasound performed.  Unfortunately spinal stimulator is not compatible with MRI.  LFTs are normal and asymptomatic.  Clinically monitor    Low blood pressure History of essential hypertension - Combination of multiple medications.  Will discontinue home Coreg, Aldactone , losartan  and HCTZ for now.  Will give very gentle hydration, midodrine if needed -Resumed home blood pressure medicines  Paroxysmal atrial fibrillation/flutter s/p ablation Tachy-brady syndrome s/p PPM: Stable. On  Toprol -XL 150 mg daily outpt, held due to relative hypotension.   -On apixaban    HFmrEF: EF 45% Stable, euvolemic on admission.  EF 45-50% by TTE 04/29/2023. -Continue Toprol , Aldactone  and losartan  at discharge   COPD: As needed bronchodilators   Cirrhosis: History of alcohol use -Alcohol withdrawal protocol   Hypothyroidism: Continue Synthroid .   Hyperlipidemia: Continue rosuvastatin .   Thrombocytopenia: Mild in setting of cirrhosis.  No obvious bleeding.   Chronic back pain: S/p spinal cord stimulator.  Continue Lyrica  and Percocet prn.   Tobacco use: Patient reports smoking 1 pack/day.  Nicotine  patch provided.  Smoking cessation advised.   Alcohol abuse: -She was previously on Librium .  She is now day 4 hospitalization with no issues of tremor or hallucinations.  DTs not likely at this point..  Will continue to monitor -Social work consulted but patient  declined -As needed Ativan  on board per [facility] protocol. -Did not need Ativan .        Consultants: General Surgery, interventional radiology Procedures performed: Intra-abdominal drain placed. Disposition: Home Diet recommendation:  Discharge Diet Orders (From admission, onward)     Start     Ordered   11/14/23 0000  Diet - low sodium heart healthy        11/14/23 1026   11/13/23 0000  Diet - low sodium heart healthy        11/13/23 1518           Normal DISCHARGE MEDICATION: Allergies as of 11/14/2023       Reactions   Ambrosia Artemisiifolia (ragweed) Skin Test Cough   Short Ragweed Pollen Ext Other (See Comments), Cough   Eye Redness, too   Sulfa Antibiotics Hives, Rash, Other (See Comments)   GI Upset, also   Vancomycin Hives   Teicoplanin Other (See Comments)   Trade name(s): Targocid and Ticocin = GI Upset   Azithromycin Rash        Medication List     TAKE these medications    albuterol  108 (90 Base) MCG/ACT inhaler Commonly known as: VENTOLIN  HFA Inhale 2 puffs into the lungs  every 6 (six) hours as needed for wheezing or shortness of breath.   amoxicillin -clavulanate 875-125 MG tablet Commonly known as: AUGMENTIN  Take 1 tablet by mouth 2 (two) times daily.   B COMPLEX PO Take 1 tablet by mouth daily.   Co Q 10 100 MG Caps Take 100 mg by mouth at bedtime.   Eliquis  5 MG Tabs tablet Generic drug: apixaban  TAKE 1 TABLET BY MOUTH TWICE A DAY   famotidine  20 MG tablet Commonly known as: PEPCID  Take 20 mg by mouth 2 (two) times daily. New Zantac 360T   fluticasone  50 MCG/ACT nasal spray Commonly known as: FLONASE  Place 2 sprays into both nostrils as needed for allergies or rhinitis (or seasonal allergies).   folic acid  1 MG tablet Commonly known as: FOLVITE  Take 1 mg by mouth daily.   HYDROmorphone  4 MG tablet Commonly known as: DILAUDID  Take 1 tablet by mouth every 4-6 hours as needed. What changed:  when to take this additional instructions   losartan -hydrochlorothiazide  50-12.5 MG tablet Commonly known as: Hyzaar Take 1 tablet by mouth daily.   magnesium  gluconate 500 MG tablet Commonly known as: MAGONATE Take 500 mg by mouth at bedtime.   metaxalone  800 MG tablet Commonly known as: SKELAXIN  Take 800 mg by mouth 2 (two) times daily.   metoprolol  succinate 100 MG  24 hr tablet Commonly known as: TOPROL -XL Take 1 tablet (100 mg total) by mouth daily. Take with or immediately following a meal. Take with 50 mg tab to total 150 mg daily. What changed:  when to take this additional instructions   metoprolol  succinate 50 MG 24 hr tablet Commonly known as: TOPROL -XL TAKE 1 TABLET DAILY WITH OR IMMEDIATELY FOLLOWING A MEAL. TAKE WITH 100 MG TAB TO TOTAL 150 MG DAILY What changed: See the new instructions.   naloxone  4 MG/0.1ML Liqd nasal spray kit Commonly known as: NARCAN  Place 1 spray into the nose as directed.   naloxone  4 MG/0.1ML Liqd nasal spray kit Commonly known as: Narcan  Take 1 spray into nostril(s) every 3 minutes until  patient awakes or EMS arrives.   Normal Saline Flush 0.9 % Soln Inject 10 mLs into the vein as needed. Inject 5-10 mL into drain twice daily.   nystatin powder Apply 1 Application topically 3 (three) times daily as needed (for heat rashes).   Pataday  0.7 % Soln Generic drug: Olopatadine  HCl Place 1 drop into both eyes daily.   potassium chloride  10 MEQ tablet Commonly known as: KLOR-CON  Take 2 tablets (20 mEq total) by mouth daily. What changed: how much to take   pregabalin  150 MG capsule Commonly known as: LYRICA  Take 150 mg by mouth See admin instructions. Take 150 mg by mouth two times a day and an additional 150 mg once day as needed for unresolved neuropathic pain   Proctofoam HC rectal foam Generic drug: hydrocortisone-pramoxine Place 1 applicator rectally 3 (three) times daily as needed for hemorrhoids or anal itching.   Restasis  MultiDose 0.05 % ophthalmic emulsion Generic drug: cycloSPORINE  Place 1 drop into both eyes in the morning and at bedtime.   rosuvastatin  20 MG tablet Commonly known as: CRESTOR  Take 20 mg by mouth daily.   senna 8.6 MG Tabs tablet Commonly known as: SENOKOT Take 2 tablets by mouth at bedtime.   spironolactone  25 MG tablet Commonly known as: ALDACTONE  TAKE 1 TABLET (25 MG TOTAL) BY MOUTH DAILY.   Synthroid  25 MCG tablet Generic drug: levothyroxine  Take 25 mcg by mouth daily before breakfast.   tiZANidine  4 MG tablet Commonly known as: ZANAFLEX  Take 8 mg by mouth at bedtime.   TYLENOL  500 MG tablet Generic drug: acetaminophen  Take 500-1,000 mg by mouth every 6 (six) hours as needed for mild pain (pain score 1-3) or headache.   Vitamin D  (Ergocalciferol ) 1.25 MG (50000 UNIT) Caps capsule Commonly known as: DRISDOL  Take 50,000 Units by mouth every Wednesday.   Wixela Inhub 250-50 MCG/ACT Aepb Generic drug: fluticasone -salmeterol Inhale 1 puff into the lungs in the morning and at bedtime.               Discharge Care  Instructions  (From admission, onward)           Start     Ordered   11/14/23 0000  Change dressing (specify)       Comments: Dressing change: daily   11/14/23 1026   11/13/23 0000  Change dressing (specify)       Comments: Dressing changes as needed to keep insertion site clean and dry.   11/13/23 1518            Follow-up Information     Eletha Boas, MD Follow up.   Specialty: General Surgery Contact information: [address] [place] [address] [place] [address] [phone]         [facility] Follow up.   Specialty: Radiology Why: Schedulers will call to arrange follow-up. Contact information: [address] [facility] [place]  [address] [phone]               Discharge Exam: [name]   11/10/23 1710 11/12/23 0500  Weight: 114.8 kg 115.3 kg   General exam: Appears calm and comfortable  Respiratory system: Clear to auscultation. Respiratory effort normal. Cardiovascular system: S1 & S2 heard, RRR. No JVD, murmurs, rubs, gallops or clicks. No pedal edema. Gastrointestinal system: Soft, obese, mild tenderness palpation right lower quadrant around drainage site.  Drain with seropurulent fluid.  No guarding or rebound or rigidity. Central nervous system: Alert and oriented. No focal neurological deficits. Extremities: Symmetric 5 x 5 power. Skin: No rashes, lesions or ulcers Psychiatry: Judgement and insight appear normal. Mood & affect appropriate.  Condition at discharge: Good  The results of significant diagnostics from this hospitalization (including imaging, microbiology, ancillary and laboratory) are listed below for reference.   Imaging Studies: CT GUIDED PERITONEAL/RETROPERITONEAL FLUID DRAIN BY [facility] CATH Result Date: 11/11/2023 INDICATION: [id number] Perforated appendicitis [id number] EXAM: CT-GUIDED ABSCESS DRAIN PLACEMENT FOR RIGHT LOWER QUADRANT ABSCESS COMPARISON:  [facility] CT AP on [facility] PACS 11/10/2023. MEDICATIONS: The  patient is currently admitted to the hospital and receiving intravenous antibiotics. The antibiotics were administered within an appropriate time frame prior to the initiation of the procedure. 25 mg Benadryl  IV was administered. ANESTHESIA/SEDATION: Moderate (conscious) sedation was employed during this procedure. A total of Versed  3 mg and Fentanyl  100 mcg was administered intravenously. Moderate Sedation Time: 22 minutes. The patient's level of consciousness and vital signs were monitored continuously by radiology nursing throughout the procedure under my direct supervision. CONTRAST:  None COMPLICATIONS: None immediate. PROCEDURE: RADIATION DOSE REDUCTION: This exam was performed according to the departmental dose-optimization program which includes automated exposure control, adjustment of the mA and/or kV according to patient size and/or use of iterative reconstruction technique. Informed written consent was obtained from the patient and/or patient's representative after a discussion of the risks, benefits and alternatives to treatment. The patient was placed supine on the CT gantry and a pre procedural CT was performed re-demonstrating the known abscess/fluid collection within the RIGHT lower quadrant. The procedure was planned. A timeout was performed prior to the initiation of the procedure. The RIGHT lower quadrant was prepped and draped in the usual sterile fashion. The overlying soft tissues were anesthetized with 1% lidocaine  with epinephrine . Appropriate trajectory was planned with the use of a 22 gauge spinal needle. An 18 gauge trocar needle was advanced into the abscess/fluid collection and a short Amplatz super stiff wire was coiled within the collection. Appropriate positioning was confirmed with a limited CT scan. The tract was serially dilated allowing placement of a 8 Fr drainage catheter. Appropriate positioning was confirmed with a limited postprocedural CT scan. 5 mL of purulent fluid was  aspirated. The tube was connected to a drainage bag and sutured in place. A dressing was placed. The patient tolerated the procedure well without immediate post procedural complication. IMPRESSION: Successful CT guided placement of a 8 Fr drainage catheter into a RIGHT lower quadrant abscess with aspiration of 5 mL of purulent fluid. Samples were sent to the laboratory as requested by the ordering clinical team. RECOMMENDATIONS: The patient will return to Vascular Interventional Radiology (VIR) for routine drainage catheter evaluation in 10-14 days. Thom Hall, MD [facility] Radiology Electronically Signed   By: Thom Hall HERO.D.  On: 11/11/2023 16:19   US  Abdomen Limited RUQ (LIVER/GB) Result Date: 11/10/2023 CLINICAL DATA:  Cholelithiasis EXAM: ULTRASOUND ABDOMEN LIMITED RIGHT UPPER QUADRANT COMPARISON:  None Available. FINDINGS: Gallbladder: A negative sonographic Beverley sign was reported by the sonographer. Cholelithiasis measuring up to 1.7 cm. No pericholecystic fluid or gallbladder wall thickening. Common bile duct: Diameter: 7 mm Liver: Coarse hepatic echotexture with nodular contour. Portal vein is patent on color Doppler imaging with normal direction of blood flow towards the liver. Other: None. IMPRESSION: 1. Cholelithiasis without evidence of acute cholecystitis. 2. Cirrhosis. Electronically Signed   By: Franky Stanford M.D.   On: 11/10/2023 21:29   CUP PACEART REMOTE DEVICE CHECK Result Date: 11/09/2023 Scheduled remote reviewed. Normal device function.  1 AHR lasting 6 seconds. Next remote 91 days. - CS, CVRS   Microbiology: Results for orders placed or performed during the hospital encounter of 11/10/23  Aerobic/Anaerobic Culture w Gram Stain (surgical/deep wound)     Status: None   Collection Time: 11/11/23  4:11 PM   Specimen: Abscess  Result Value Ref Range Status   Specimen Description ABSCESS ABDOMEN  Final   Special Requests RLQ   Final   Gram Stain   Final    MODERATE WBC PRESENT, PREDOMINANTLY PMN MODERATE GRAM NEGATIVE RODS MODERATE GRAM POSITIVE RODS    Culture   Final    ABUNDANT ESCHERICHIA COLI ABUNDANT BACTEROIDES SPECIES BETA LACTAMASE POSITIVE Performed at [facility], [address] [place], [place] [address]    Report Status 11/14/2023 FINAL  Final   Organism ID, Bacteria ESCHERICHIA COLI  Final      Susceptibility   Escherichia coli - MIC*    AMPICILLIN >=32 RESISTANT Resistant     CEFEPIME <=0.12 SENSITIVE Sensitive     CEFTAZIDIME <=1 SENSITIVE Sensitive     CEFTRIAXONE <=0.25 SENSITIVE Sensitive     CIPROFLOXACIN <=0.25 SENSITIVE Sensitive     GENTAMICIN  <=1 SENSITIVE Sensitive     IMIPENEM <=0.25 SENSITIVE Sensitive     TRIMETH/SULFA <=20 SENSITIVE Sensitive     AMPICILLIN/SULBACTAM >=32 RESISTANT Resistant     PIP/TAZO <=4 SENSITIVE Sensitive ug/mL    * ABUNDANT ESCHERICHIA COLI    Labs: CBC: Recent Labs  Lab 11/10/23 1730 11/11/23 0630 11/12/23 0722 11/13/23 0608 11/14/23 0744  WBC 6.3 6.0 5.9 5.1 4.4  NEUTROABS 4.2  --   --   --   --   HGB 13.4 12.5 11.4* 11.4* 10.9*  HCT 40.9 38.9 35.1* 36.0 33.4*  MCV 101.2* 103.2* 101.4* 103.2* 103.7*  PLT 126* 124* 121* 151 159   Basic Metabolic Panel: Recent Labs  Lab 11/10/23 1730 11/11/23 0630 11/12/23 0722 11/13/23 0608 11/14/23 0744  NA 131* 133* 130* 134* 136  K 3.3* 4.4 4.1 3.8 3.8  CL 96* 98 97* 103 103  CO2 21* 25 22 22 24   GLUCOSE 221* 214* 207* 200* 205*  BUN 11 9 9 9  7*  CREATININE 1.11* 1.00 1.29* 1.01* 0.83  CALCIUM  9.3 9.3 8.6* 8.8* 8.8*  MG  --  2.3 2.1 2.2 2.1  PHOS  --   --  2.6  --   --    Liver Function Tests: Recent Labs  Lab 11/10/23 1730 11/11/23 0630  AST 33 62*  ALT 28 38  ALKPHOS 125 132*  BILITOT 0.8 1.0  PROT 7.4 6.8  ALBUMIN 3.1* 2.8*   CBG: No results for input(s): GLUCAP in the last 168 hours.  Discharge time spent: Less than 30 minutes.  Signed: Reyes VEAR Gaw,  MD [facility] 11/14/2023

## 2023-11-16 ENCOUNTER — Other Ambulatory Visit: Payer: Self-pay | Admitting: Surgery

## 2023-11-16 ENCOUNTER — Other Ambulatory Visit (HOSPITAL_COMMUNITY): Payer: Self-pay

## 2023-11-16 DIAGNOSIS — K3533 Acute appendicitis with perforation and localized peritonitis, with abscess: Secondary | ICD-10-CM

## 2023-11-20 ENCOUNTER — Other Ambulatory Visit: Payer: 59

## 2023-11-21 ENCOUNTER — Emergency Department (HOSPITAL_COMMUNITY)
Admission: EM | Admit: 2023-11-21 | Discharge: 2023-11-21 | Disposition: A | Discharge status: Home / Self Care | Payer: 59 | Attending: Emergency Medicine | Admitting: Emergency Medicine

## 2023-11-21 ENCOUNTER — Other Ambulatory Visit: Payer: Self-pay

## 2023-11-21 ENCOUNTER — Emergency Department (HOSPITAL_COMMUNITY): Payer: 59

## 2023-11-21 DIAGNOSIS — E039 Hypothyroidism, unspecified: Secondary | ICD-10-CM | POA: Insufficient documentation

## 2023-11-21 DIAGNOSIS — W1839XA Other fall on same level, initial encounter: Secondary | ICD-10-CM | POA: Insufficient documentation

## 2023-11-21 DIAGNOSIS — W19XXXA Unspecified fall, initial encounter: Secondary | ICD-10-CM

## 2023-11-21 DIAGNOSIS — F17210 Nicotine dependence, cigarettes, uncomplicated: Secondary | ICD-10-CM | POA: Diagnosis not present

## 2023-11-21 DIAGNOSIS — J449 Chronic obstructive pulmonary disease, unspecified: Secondary | ICD-10-CM | POA: Diagnosis not present

## 2023-11-21 DIAGNOSIS — S82831A Other fracture of upper and lower end of right fibula, initial encounter for closed fracture: Secondary | ICD-10-CM | POA: Diagnosis not present

## 2023-11-21 DIAGNOSIS — M25571 Pain in right ankle and joints of right foot: Secondary | ICD-10-CM | POA: Diagnosis present

## 2023-11-21 DIAGNOSIS — Z7901 Long term (current) use of anticoagulants: Secondary | ICD-10-CM | POA: Insufficient documentation

## 2023-11-21 DIAGNOSIS — S92411A Displaced fracture of proximal phalanx of right great toe, initial encounter for closed fracture: Secondary | ICD-10-CM | POA: Insufficient documentation

## 2023-11-21 MED ORDER — ACETAMINOPHEN 500 MG PO TABS
500.0000 mg | ORAL_TABLET | Freq: Once | ORAL | Status: AC
  Administered 2023-11-21: 500 mg via ORAL
  Filled 2023-11-21: qty 1

## 2023-11-21 NOTE — ED Notes (Signed)
Pt requested bp reading be performed on forearm stating that it bruises her upper arm.  Pt also states her BP is typically low with sys in the 90s

## 2023-11-21 NOTE — Progress Notes (Signed)
Orthopedic Tech Progress Note Patient Details:  [name] December 25, 1960 [id number]  Ortho Devices Type of Ortho Device: CAM walker Ortho Device/Splint Location: RLE Ortho Device/Splint Interventions: Ordered, Application, Adjustment   Post Interventions Patient Tolerated: Well Instructions Provided: Adjustment of device  Tonye Pearson 11/21/2023, 1:43 PM

## 2023-11-21 NOTE — ED Provider Notes (Signed)
[place] [facility]MENT AT [facility] Provider Note   CSN: [id number] Arrival date & time: 11/21/23  1052     History  Chief Complaint  Patient presents with   Ankle Pain   Fall    [name] is a 63 y.o. female.   Ankle Pain Fall   63 year old female presents emergency department after a fall.  Patient states that she was trying to transfer out of her wheelchair when the right wheel was not locked.  States that the wheelchair went out from underneath her and she rolled her right ankle inward landing on her right knee/hip.  Denies trauma to head, LOC.  Patient is on anticoagulation the form of Eliquis secondary to atrial fibrillation.  Currently complaining of right-sided hip pain, right knee pain, right foot/ankle pain.  States she is mainly in the wheelchair but does walk short distances whenever she is transferring or has to walk her dog outside.  Denies any chest pain, shortness of breath, abdominal pain, nausea, vomiting.  Has taken no medication for her symptoms.  Denies any weakness or sensory deficits from baseline in affected leg.  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  Past medical history significant for alcohol abuse, BPPV, COPD, constipation, GERD, hyperlipidemia, hypothyroidism, neuromuscular disorder, spinal cord stimulator, spinal stenosis, atrial flutter on Eliquis Home Medications Prior to Admission medications   Medication Sig Start Date End Date Taking? Authorizing Provider  albuterol (PROVENTIL HFA;VENTOLIN HFA) 108 (90 Base) MCG/ACT inhaler Inhale 2 puffs into the lungs every 6 (six) hours as needed for wheezing or shortness of breath.  05/14/18   [provider]  amoxicillin-clavulanate (AUGMENTIN) 875-125 MG tablet Take 1 tablet by mouth 2 (two) times daily. 11/14/23   Griselda Miner, MD  B Complex Vitamins (B COMPLEX PO) Take 1 tablet by mouth daily.    [provider]  Coenzyme Q10 (CO Q 10) 100 MG  CAPS Take 100 mg by mouth at bedtime.    [provider]  ELIQUIS 5 MG TABS tablet TAKE 1 TABLET BY MOUTH TWICE A DAY 09/16/23   Wendall Stade, MD  famotidine (PEPCID) 20 MG tablet Take 20 mg by mouth 2 (two) times daily. New Zantac 360T 05/26/18   [provider]  fluticasone (FLONASE) 50 MCG/ACT nasal spray Place 2 sprays into both nostrils as needed for allergies or rhinitis (or seasonal allergies).    [provider]  folic acid (FOLVITE) 1 MG tablet Take 1 mg by mouth daily. 04/01/16   [provider]  HYDROmorphone (DILAUDID) 4 MG tablet Take 1 tablet by mouth every 4-6 hours as needed. Patient taking differently: Take 4 mg by mouth See admin instructions. Take 4 mg by mouth every four hours 10/30/23     losartan-hydrochlorothiazide (HYZAAR) 50-12.5 MG tablet Take 1 tablet by mouth daily. 07/28/23   Wendall Stade, MD  magnesium gluconate (MAGONATE) 500 MG tablet Take 500 mg by mouth at bedtime.    [provider]  metaxalone (SKELAXIN) 800 MG tablet Take 800 mg by mouth 2 (two) times daily. 02/20/20   [provider]  metoprolol succinate (TOPROL-XL) 100 MG 24 hr tablet Take 1 tablet (100 mg total) by mouth daily. Take with or immediately following a meal. Take with 50 mg tab to total 150 mg daily. Patient taking differently: Take 100 mg by mouth See admin instructions. Take 100 mg by mouth at bedtime, in conjunction with one 50 mg tablet to equal a  total dose of 150 mg 11/20/22   Camnitz, Andree Coss, MD  metoprolol succinate (TOPROL-XL) 50 MG 24 hr tablet TAKE 1 TABLET DAILY WITH OR IMMEDIATELY FOLLOWING A MEAL. TAKE WITH 100 MG TAB TO TOTAL 150 MG DAILY Patient taking differently: Take 50 mg by mouth See admin instructions. Take 50 mg by mouth at bedtime, in conjunction with one 100 mg tablet to equal a total dose of 150 mg 02/06/23   Camnitz, Andree Coss, MD  naloxone [facility]) nasal spray 4 mg/0.1 mL Place 1 spray into the nose as directed.  09/02/22   [provider]  naloxone [facility]) nasal spray 4 mg/0.1 mL Take 1 spray into nostril(s) every 3 minutes until patient awakes or EMS arrives. Patient not taking: Reported on 11/10/2023 10/30/23     nystatin powder Apply 1 Application topically 3 (three) times daily as needed (for heat rashes).    [provider]  Olopatadine HCl (PATADAY) 0.7 % SOLN Place 1 drop into both eyes daily. 12/29/19   [provider]  potassium chloride (KLOR-CON) 10 MEQ tablet Take 2 tablets (20 mEq total) by mouth daily. Patient taking differently: Take 10 mEq by mouth daily. 03/13/23   Graciella Freer, PA-C  pregabalin (LYRICA) 150 MG capsule Take 150 mg by mouth See admin instructions. Take 150 mg by mouth two times a day and an additional 150 mg once day as needed for unresolved neuropathic pain 11/15/15   [provider]  PROCTOFOAM [facility] rectal foam Place 1 applicator rectally 3 (three) times daily as needed for hemorrhoids or anal itching.    [provider]  RESTASIS MULTIDOSE 0.05 % ophthalmic emulsion Place 1 drop into both eyes in the morning and at bedtime. 03/12/20   [provider]  rosuvastatin (CRESTOR) 20 MG tablet Take 20 mg by mouth daily.    [provider]  senna (SENOKOT) 8.6 MG TABS tablet Take 2 tablets by mouth at bedtime.    [provider]  sodium chloride flush (NS) 0.9 % SOLN Inject 10 mLs into the vein as needed. Inject 5-10 mL into drain twice daily. 11/13/23 12/13/23  Hoyt Koch, PA  spironolactone (ALDACTONE) 25 MG tablet TAKE 1 TABLET (25 MG TOTAL) BY MOUTH DAILY. 11/11/23   Graciella Freer, PA-C  SYNTHROID 25 MCG tablet Take 25 mcg by mouth daily before breakfast.    [provider]  tiZANidine (ZANAFLEX) 4 MG tablet Take 8 mg by mouth at bedtime. 12/26/19   [provider]  TYLENOL 500 MG tablet Take 500-1,000 mg by mouth every 6 (six) hours as needed for mild pain (pain score  1-3) or headache.    [provider]  Vitamin D, Ergocalciferol, (DRISDOL) 50000 units CAPS capsule Take 50,000 Units by mouth every Wednesday.  03/16/16   [provider]  Monte Fantasia INHUB 250-50 MCG/ACT AEPB Inhale 1 puff into the lungs in the morning and at bedtime.    [provider]      Allergies    Ambrosia artemisiifolia (ragweed) skin test, Short ragweed pollen ext, Sulfa antibiotics, Vancomycin, Teicoplanin, and Azithromycin    Review of Systems   Review of Systems  All other systems reviewed and are negative.   Physical Exam Updated Vital Signs BP (!) 78/52   Pulse 84   Temp (!) 97.5 F (36.4 C) (Oral)   Resp 18   Ht 5\' 9"  (1.753 m)   Wt 108.9 kg   LMP  (LMP Unknown)   SpO2  92%   BMI 35.44 kg/m  Physical Exam Vitals and nursing note reviewed.  Constitutional:      General: She is not in acute distress.    Appearance: She is well-developed.  HENT:     Head: Normocephalic and atraumatic.  Eyes:     Conjunctiva/sclera: Conjunctivae normal.  Cardiovascular:     Rate and Rhythm: Normal rate and regular rhythm.     Heart sounds: No murmur heard. Pulmonary:     Effort: Pulmonary effort is normal. No respiratory distress.     Breath sounds: Normal breath sounds.  Abdominal:     Palpations: Abdomen is soft.     Tenderness: There is no abdominal tenderness.  Musculoskeletal:        General: No swelling.     Cervical back: Neck supple.     Comments: No midline tenderness cervical, thoracic, lumbar spine.  Tender palpation right greater than left proximal hip.  Tenderness right proximal fibula otherwise, no tenderness of right knee.  Tender to palpation right ATFL region as well as posterior aspect of lateral malleolus of right ankle.  Otherwise, no tenderness right foot/ankle.  Pedal pulses 2+ bilaterally.  No overlying abrasions, lacerations or other traumatic injury.  Skin:    General: Skin is warm and dry.     Capillary Refill: Capillary  refill takes less than 2 seconds.  Neurological:     Mental Status: She is alert.  Psychiatric:        Mood and Affect: Mood normal.     ED Results / Procedures / Treatments   Labs (all labs ordered are listed, but only abnormal results are displayed) Labs Reviewed - No data to display  EKG None  Radiology DG Hips Bilat W or Wo Pelvis 3-4 Views Result Date: 11/21/2023 CLINICAL DATA:  pain EXAM: DG HIP (WITH OR WITHOUT PELVIS) 3-4V BILAT COMPARISON:  November 10, 2023. FINDINGS: Osteopenia. No acute fracture or dislocation. Mild degenerative changes of bilateral hips. Lower lumbar facet arthropathy. Sacrum is obscured by overlapping bowel contents. No area of erosion or osseous destruction. No unexpected radiopaque foreign body. Atherosclerotic calcifications. RIGHT lower quadrant drain IMPRESSION: 1.  No acute fracture or dislocation. 2. If there is a persistent clinical concern for nondisplaced hip or pelvic fracture, recommend dedicated pelvic CT or MRI. Marland Kitchen Electronically Signed   By: Meda Klinefelter M.D.   On: 11/21/2023 12:28   DG Ankle Complete Right Result Date: 11/21/2023 CLINICAL DATA:  pain EXAM: RIGHT FOOT COMPLETE - 3+ VIEW; RIGHT ANKLE - COMPLETE 3+ VIEW COMPARISON:  None Available. FINDINGS: Osteopenia. Favored nondisplaced fracture of the base of the first proximal phalanx. Lucency on oblique ankle radiograph traversing the distal fibula could reflect a nondisplaced fracture given adjacent edema. Moderate to severe degenerative changes of the midfoot. Bidirectional calcaneal enthesophytes. Degenerative changes of the tibiotalar joint. Ankle mortise appears preserved. No area of erosion or osseous destruction. No unexpected radiopaque foreign body. Soft tissue edema abutting the lateral ankle. IMPRESSION: 1. Favored nondisplaced fracture of the base of the first proximal phalanx. 2. Lucency on oblique ankle radiograph traversing the distal fibula could reflect a nondisplaced  fracture given adjacent edema. Recommend correlation with point tenderness. Electronically Signed   By: Meda Klinefelter M.D.   On: 11/21/2023 12:25   DG Foot Complete Right Result Date: 11/21/2023 CLINICAL DATA:  pain EXAM: RIGHT FOOT COMPLETE - 3+ VIEW; RIGHT ANKLE - COMPLETE 3+ VIEW COMPARISON:  None Available. FINDINGS: Osteopenia. Favored nondisplaced fracture of the base of  the first proximal phalanx. Lucency on oblique ankle radiograph traversing the distal fibula could reflect a nondisplaced fracture given adjacent edema. Moderate to severe degenerative changes of the midfoot. Bidirectional calcaneal enthesophytes. Degenerative changes of the tibiotalar joint. Ankle mortise appears preserved. No area of erosion or osseous destruction. No unexpected radiopaque foreign body. Soft tissue edema abutting the lateral ankle. IMPRESSION: 1. Favored nondisplaced fracture of the base of the first proximal phalanx. 2. Lucency on oblique ankle radiograph traversing the distal fibula could reflect a nondisplaced fracture given adjacent edema. Recommend correlation with point tenderness. Electronically Signed   By: Meda Klinefelter M.D.   On: 11/21/2023 12:25   DG Knee Complete 4 Views Right Result Date: 11/21/2023 CLINICAL DATA:  pain EXAM: RIGHT KNEE - COMPLETE 4+ VIEW COMPARISON:  None Available. FINDINGS: Osteopenia. Discontinuity of the cortex of the fibular head favored to reflect a mildly displaced fibular head fracture. Mild joint space narrowing and moderate osteophyte formation of the medial and lateral compartments. Moderate joint space narrowing of the patellofemoral compartment with osteophyte formation. No area of erosion or osseous destruction. No unexpected radiopaque foreign body. Soft tissues are unremarkable. IMPRESSION: 1. Discontinuity of the cortex of the fibular head favored to reflect a mildly displaced fibular head fracture. 2. Moderate tricompartmental degenerative changes of the knee.  Electronically Signed   By: Meda Klinefelter M.D.   On: 11/21/2023 12:20    Procedures Procedures    Medications Ordered in ED Medications  acetaminophen (TYLENOL) tablet 500 mg (500 mg Oral Given 11/21/23 1217)    ED Course/ Medical Decision Making/ A&P Clinical Course as of 11/21/23 1540  Sat Nov 21, 2023  1311 Consult orthopedic Dr. Hulda Humphrey who recommended patient placed in a cam walker boot and follow-up in the outpatient setting.  Patient to be nonweightbearing. [CR]  1540 BP(!): 78/52 Patient's blood pressure requested be updated by nursing staff of which is 115/85 although not updated by nursing staff. [CR]    Clinical Course User Index [CR] Peter Garter, PA                                 Medical Decision Making Amount and/or Complexity of Data Reviewed Radiology: ordered.  Risk OTC drugs.   This patient presents to the ED for concern of fall, this involves an extensive number of treatment options, and is a complaint that carries with it a high risk of complications and morbidity.  The differential diagnosis includes fracture, strain/pain, dislocation, ligamentous/tendinous injury, neurovascular compromise, other   Co morbidities that complicate the patient evaluation  See HPI   Additional history obtained:  Additional history obtained from EMR External records from outside source obtained and reviewed including hospital records   Lab Tests:  N/a   Imaging Studies ordered:  I ordered imaging studies including pelvis x-ray, right knee x-ray, right ankle/foot x-ray I independently visualized and interpreted imaging which showed  Pelvis x-ray: No acute fracture or dislocation. Right foot/ankle x-ray: Nondisplaced fracture base of first proximal phalanx.  Lucency oblique transversing distal fibula. Right knee x-ray: Mild displaced fracture of fibular head.  Moderate tricompartmental OA. I agree with the radiologist interpretation  Cardiac Monitoring:  / EKG:  The patient was maintained on a cardiac monitor.  I personally viewed and interpreted the cardiac monitored which showed an underlying rhythm of: Sinus rhythm   Consultations Obtained:  N/a   Problem List / ED Course / Critical interventions /  Medication management  Fall I ordered medication including Tylenol Reevaluation of the patient after these medicines showed that the patient improved I have reviewed the patients home medicines and have made adjustments as needed   Social Determinants of Health:  Chronic cigarette use.   Test / Admission - Considered:  Fall, proximal phalanx fracture, fibular fracture Vitals signs within normal range and stable throughout visit. Imaging studies significant for: See above 63 year old female presents emergency department after mechanical fall.  Larey Seat try to get out of her wheelchair when one of the wheels was unlocked.  On exam, tenderness right lateral malleolus as well as right proximal fibula with minimal hip tenderness bilaterally.  EXTR imaging obtained of areas of reproducible tenderness which were concerning for proximal fibular fracture as well as possible lateral malleolus fracture and proximal phalanx fracture.  Patient has been able to ambulate since the fall with negative pelvic x-ray; low suspicion for hip fracture.  Given that there is some concern for [place] fracture, orthopedics was consulted who independently reviewed patient imaging and recommended cam walker boot, nonweightbearing and follow-up in the outpatient setting.  Treatment plan discussed at length with patient and she acknowledged understanding was agreeable to said plan.  Patient overall well-appearing, afebrile in no acute distress. Worrisome signs and symptoms were discussed with the patient, and the patient acknowledged understanding to return to the ED if noticed. Patient was stable upon discharge.          Final Clinical Impression(s) / ED  Diagnoses Final diagnoses:  Fall, initial encounter  Closed fracture of proximal end of right fibula, unspecified fracture morphology, initial encounter  Closed displaced fracture of proximal phalanx of right great toe, initial encounter    Rx / DC Orders ED Discharge Orders     None         Peter Garter, [place] 11/21/23 1540    Gloris Manchester, MD 11/22/23 1555

## 2023-11-21 NOTE — ED Triage Notes (Signed)
PT BIB EMS coming from home, States that she fell while transferring to her wheelchair. States wheelchair was not locked. States she collapsed on her and knee and fell on right ankle. Pt is on eliquis. Pain 8/10

## 2023-11-21 NOTE — Discharge Instructions (Signed)
As discussed, x-rays did show fracture of your right lower leg.  Wear the cam walker boot that we placed you in while in the emergency department.  Recommend not bearing any weight on your right leg until you follow-up with orthopedics.  Recommend elevating leg above the level of your heart to help with swelling/inflammation as well as icing areas of pain.  You may continue to take your at home pain medication.  Follow-up with orthopedics in the outpatient setting.  Recommend calling [facility] to set up an appointment as you already have establish care.  Please do not hesitate to return if the worrisome signs and symptoms we discussed become apparent.

## 2023-11-23 ENCOUNTER — Other Ambulatory Visit (HOSPITAL_COMMUNITY): Payer: Self-pay

## 2023-11-23 MED ORDER — NORMAL SALINE FLUSH 0.9 % IV SOLN
INTRAVENOUS | 0 refills | Status: DC
  Filled 2023-11-23: qty 200, 10d supply, fill #0

## 2023-11-30 ENCOUNTER — Other Ambulatory Visit (HOSPITAL_BASED_OUTPATIENT_CLINIC_OR_DEPARTMENT_OTHER): Payer: Self-pay

## 2023-11-30 ENCOUNTER — Other Ambulatory Visit: Payer: Self-pay

## 2023-11-30 MED ORDER — HYDROMORPHONE HCL 4 MG PO TABS
4.0000 mg | ORAL_TABLET | ORAL | 0 refills | Status: AC
  Filled 2023-11-30: qty 150, 25d supply, fill #0

## 2023-12-01 ENCOUNTER — Other Ambulatory Visit (HOSPITAL_BASED_OUTPATIENT_CLINIC_OR_DEPARTMENT_OTHER): Payer: Self-pay

## 2023-12-02 ENCOUNTER — Inpatient Hospital Stay: Admission: RE | Admit: 2023-12-02 | Payer: 59 | Source: Ambulatory Visit

## 2023-12-02 ENCOUNTER — Other Ambulatory Visit: Payer: 59

## 2023-12-04 NOTE — Progress Notes (Signed)
 Referring Physician(s): Gerkin,Todd  Chief Complaint: The patient is seen in follow up today s/p perforated appendicitis abscess drain placed 11/11/23  History of present illness: [name] is a 63 year old female with a past medical history significant for ETOH cirrhosis with esophageal varices, COPD, paroxysmal atrial fibrillation (Eliquis), SSS s/p pacemaker and chronic back pain with spinal cord stimulator. She was sent to the ED 11/10/23 by her PCP for evaluation of abdominal pain and nausea. She had been seen earlier that day by her PCP and a CT scan showed appendicitis with a contained peri-appendiceal abscess. She was assessed by General Surgery and found to be a high-risk surgical candidate. Interventional Radiology was consulted for aspiration/drain placement and she received an 8 Fr drain to the RLQ. She was discharged from the hospital 11/14/23 with oral antibiotics and outpatient Surgery/IR follow up. She presents today to the Interventional Radiology outpatient clinic for a drain evaluation with CT imaging and possible contrast injection under fluoroscopy.  She has had trace fluid from her drain over the past several days.  She recently accidentally retract the tube some.  No fevers, chills, or new abdominal pain.  Past Medical History:  Diagnosis Date   Alcohol abuse    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   pacemaker insertion for tachy/brady syndrome   Spinal cord stimulator status    Spinal stenosis     Tobacco abuse     Past Surgical History:  Procedure Laterality Date   ANKLE SURGERY     ATRIAL FIBRILLATION ABLATION N/A 10/02/2020   Procedure: ATRIAL FIBRILLATION ABLATION;  Surgeon: Regan Lemming, MD;  Location: [facility];  Service: Cardiovascular;  Laterality: N/A;   CERVICAL CONE BIOPSY     CESAREAN SECTION     COLONOSCOPY     HERNIA REPAIR     has mesh, open transverse abdominal incision   KNEE SURGERY     x5   ORIF ANKLE FRACTURE Left 06/02/2018   Procedure: OPEN REDUCTION INTERNAL FIXATION (ORIF) ANKLE FRACTURE;  Surgeon: Toni Arthurs, MD;  Location: [facility];  Service: Orthopedics;  Laterality: Left;   ORIF ANKLE FRACTURE Left 08/26/2018   Procedure: Left ankle syndesmosis OPEN REDUCTION INTERNAL FIXATION (ORIF)/ reconstruction of deltoid ligament;  Surgeon: Toni Arthurs, MD;  Location: [facility];  Service: Orthopedics;  Laterality: Left;   PACEMAKER IMPLANT N/A 08/05/2018   Procedure: PACEMAKER IMPLANT;  Surgeon: Regan Lemming, MD;  Location: [facility];  Service: Cardiovascular;  Laterality: N/A;   POLYPECTOMY     SPINAL CORD STIMULATOR INSERTION N/A 05/07/2016   Procedure: LUMBAR SPINAL CORD STIMULATOR INSERTION;  Surgeon: Venita Lick, MD;  Location: [facility];  Service: Orthopedics;  Laterality: N/A;    Allergies: Ambrosia artemisiifolia (ragweed) skin test, Short ragweed pollen ext, Sulfa antibiotics, Vancomycin, Teicoplanin, and Azithromycin  Medications: Prior to Admission medications   Medication Sig Start Date End Date  Taking? Authorizing Provider  albuterol (PROVENTIL HFA;VENTOLIN HFA) 108 (90 Base) MCG/ACT inhaler Inhale 2 puffs into the lungs every 6 (six) hours as needed for wheezing or shortness of breath.  05/14/18   [provider]  amoxicillin-clavulanate (AUGMENTIN) 875-125 MG tablet Take 1 tablet by mouth 2 (two) times daily. 11/14/23   Griselda Miner, MD  B Complex Vitamins (B COMPLEX PO) Take 1 tablet by  mouth daily.    [provider]  Coenzyme Q10 (CO Q 10) 100 MG CAPS Take 100 mg by mouth at bedtime.    [provider]  ELIQUIS 5 MG TABS tablet TAKE 1 TABLET BY MOUTH TWICE A DAY 09/16/23   Wendall Stade, MD  famotidine (PEPCID) 20 MG tablet Take 20 mg by mouth 2 (two) times daily. New Zantac 360T 05/26/18   [provider]  fluticasone (FLONASE) 50 MCG/ACT nasal spray Place 2 sprays into both nostrils as needed for allergies or rhinitis (or seasonal allergies).    [provider]  folic acid (FOLVITE) 1 MG tablet Take 1 mg by mouth daily. 04/01/16   [provider]  HYDROmorphone (DILAUDID) 4 MG tablet Take 1 tablet (4 mg total) by mouth every 4-6 hours as needed 11/30/23     losartan-hydrochlorothiazide (HYZAAR) 50-12.5 MG tablet Take 1 tablet by mouth daily. 07/28/23   Wendall Stade, MD  magnesium gluconate (MAGONATE) 500 MG tablet Take 500 mg by mouth at bedtime.    [provider]  metaxalone (SKELAXIN) 800 MG tablet Take 800 mg by mouth 2 (two) times daily. 02/20/20   [provider]  metoprolol succinate (TOPROL-XL) 100 MG 24 hr tablet Take 1 tablet (100 mg total) by mouth daily. Take with or immediately following a meal. Take with 50 mg tab to total 150 mg daily. Patient taking differently: Take 100 mg by mouth See admin instructions. Take 100 mg by mouth at bedtime, in conjunction with one 50 mg tablet to equal a total dose of 150 mg 11/20/22   Camnitz, Andree Coss, MD  metoprolol succinate (TOPROL-XL) 50 MG 24 hr tablet TAKE 1 TABLET DAILY WITH OR IMMEDIATELY FOLLOWING A MEAL. TAKE WITH 100 MG TAB TO TOTAL 150 MG DAILY Patient taking differently: Take 50 mg by mouth See admin instructions. Take 50 mg by mouth at bedtime, in conjunction with one 100 mg tablet to equal a total dose of 150 mg 02/06/23   Camnitz, Andree Coss, MD  naloxone [employer]) nasal spray 4 mg/0.1 mL Place 1 spray into the nose as directed. 09/02/22   [provider]  naloxone [facility]) nasal spray 4 mg/0.1 mL Take 1 spray into nostril(s) every 3 minutes until patient awakes or EMS arrives. Patient not taking: Reported on 11/10/2023 10/30/23     nystatin powder Apply 1 Application topically 3 (three) times daily as needed (for heat rashes).    [provider]  Olopatadine HCl (PATADAY) 0.7 % SOLN Place 1 drop into both eyes daily. 12/29/19   [provider]  potassium chloride (KLOR-CON) 10 MEQ tablet Take 2 tablets (20 mEq total) by mouth daily. Patient taking differently: Take 10 mEq by mouth daily. 03/13/23   Graciella Freer, PA-C  pregabalin (LYRICA) 150 MG capsule Take 150 mg by mouth See admin instructions. Take 150 mg by mouth two times a day and an additional 150 mg once day as needed for unresolved neuropathic pain 11/15/15   [provider]  PROCTOFOAM [facility] rectal foam Place 1 applicator  rectally 3 (three) times daily as needed for hemorrhoids or anal itching.    [provider]  RESTASIS MULTIDOSE 0.05 % ophthalmic emulsion Place 1 drop into both eyes in the morning and at bedtime. 03/12/20   [provider]  rosuvastatin (CRESTOR) 20 MG tablet Take 20 mg by mouth daily.    [provider]  senna (SENOKOT) 8.6 MG TABS tablet Take 2 tablets by mouth at bedtime.    [provider]  Sodium Chloride Flush (NORMAL SALINE FLUSH) 0.9 % SOLN flush with 5ml twice daily 11/23/23   Genelda Roark, Thressa Sheller, MD  sodium chloride flush (NS) 0.9 % SOLN Inject 10 mLs into the vein as needed. Inject 5-10 mL into drain twice daily. 11/13/23 12/13/23  Hoyt Koch, PA  spironolactone (ALDACTONE) 25 MG tablet TAKE 1 TABLET (25 MG TOTAL) BY MOUTH DAILY. 11/11/23   Graciella Freer, PA-C  SYNTHROID 25 MCG tablet Take 25 mcg by mouth daily before breakfast.    [provider]  tiZANidine (ZANAFLEX) 4 MG tablet Take 8 mg by mouth at bedtime. 12/26/19   [provider]  TYLENOL 500  MG tablet Take 500-1,000 mg by mouth every 6 (six) hours as needed for mild pain (pain score 1-3) or headache.    [provider]  Vitamin D, Ergocalciferol, (DRISDOL) 50000 units CAPS capsule Take 50,000 Units by mouth every Wednesday.  03/16/16   [provider]  Monte Fantasia INHUB 250-50 MCG/ACT AEPB Inhale 1 puff into the lungs in the morning and at bedtime.    [provider]     Family History  Adopted: Yes  Family history unknown: Yes    Social History   Socioeconomic History   Marital status: Widowed    Spouse name: Not on file   Number of children: Not on file   Years of education: Not on file   Highest education level: Not on file  Occupational History   Not on file  Tobacco Use   Smoking status: Every Day    Current packs/day: 1.00    Average packs/day: 1 pack/day for 30.0 years (30.0 ttl pk-yrs)    Types: Cigarettes   Smokeless tobacco: Never  Vaping Use   Vaping status: Never Used  Substance and Sexual Activity   Alcohol use: Yes    Comment: goes through a pint of liquor about every 2 days   Drug use: No   Sexual activity: Yes    Birth control/protection: Post-menopausal  Other Topics Concern   Not on file  Social History Narrative   Not on file   Social Drivers of Health   Financial Resource Strain: Medium Risk (04/13/2023)   [facility]   Overall Financial Resource Strain (CARDIA)    Difficulty of Paying Living Expenses: Somewhat hard  Food Insecurity: No Food Insecurity (11/11/2023)   Hunger Vital Sign    Worried About Running Out of Food in the Last Year: Never true    Ran Out of Food in the Last Year: Never true  Transportation Needs: No Transportation Needs (11/11/2023)   PRAPARE - Administrator, Civil Service (Medical): No    Lack of Transportation (Non-Medical): No  Physical Activity: Unknown (06/10/2022)   [facility] prior to 12/06/2022[facility] visits prior to 12/06/2022.   Exercise Vital Sign    Days of Exercise per Week: 0 days  Minutes of Exercise per Session: Not on file  Stress: No Stress Concern Present (06/10/2022)   [facility] prior to 12/06/2022[facility] visits prior to 12/06/2022.   [employer] of Occupational Health - Occupational Stress Questionnaire    Feeling of Stress : Not at all  Social Connections: Unknown (06/10/2022)   [facility] prior to 12/06/2022[facility] visits prior to 12/06/2022.   Social Advertising account executive [NHANES]    Frequency of Communication with Friends and Family: Twice a week    Frequency of Social Gatherings with Friends and Family: Once a week    Attends Religious Services: Patient declined    Database administrator or Organizations: No    Attends Engineer, structural: Never    Marital Status: Married     Vital Signs: LMP  (LMP Unknown)   Physical Exam Constitutional:      General: She is not in acute distress. HENT:     Head: Normocephalic.     Mouth/Throat:     Mouth: Mucous membranes are moist.  Eyes:     General: No scleral icterus. Cardiovascular:     Rate and Rhythm: Normal rate and regular rhythm.  Pulmonary:     Effort: No respiratory distress.  Abdominal:     General: There is no distension.     Comments: RLQ drain place, clean, dry, and intact, trace tan fluid in bag.  Neurological:     Mental Status: She is alert.     Imaging: CT AP 12/07/23   Labs:  CBC: Recent Labs    11/11/23 0630 11/12/23 0722 11/13/23 0608 11/14/23 0744  WBC 6.0 5.9 5.1 4.4  HGB 12.5 11.4* 11.4* 10.9*  HCT 38.9 35.1* 36.0 33.4*  PLT 124* 121* 151 159    COAGS: Recent Labs    11/11/23 0802  INR 1.4*     BMP: Recent Labs    11/11/23 0630 11/12/23 0722 11/13/23 0608 11/14/23 0744  NA 133* 130* 134* 136  K 4.4 4.1 3.8 3.8  CL 98 97* 103 103  CO2 25 22 22 24   GLUCOSE 214* 207* 200* 205*  BUN 9 9 9  7*  CALCIUM 9.3 8.6* 8.8* 8.8*  CREATININE 1.00 1.29* 1.01* 0.83  GFRNONAA >60 47* >60 >60    LIVER FUNCTION TESTS: Recent Labs    11/10/23 1730 11/11/23 0630  BILITOT 0.8 1.0  AST 33 62*  ALT 28 38  ALKPHOS 125 132*  PROT 7.4 6.8  ALBUMIN 3.1* 2.8*    Assessment and Plan: 63 year old female with a history of perforated appendicitis with abscess status post CT guided RLQ drain placement on 11/11/23.  The drain has been retracted into the right abdominal musculature, without any evidence of residual fluid collection.    The drain was removed today.  Follow up with IR as needed.  Marliss Coots, MD Pager: [phone]    I spent a total of 25 Minutes in face to face in clinical consultation, greater than 50% of which was counseling/coordinating care for perforated appendicitis.

## 2023-12-07 ENCOUNTER — Ambulatory Visit
Admission: RE | Admit: 2023-12-07 | Discharge: 2023-12-07 | Disposition: A | Discharge status: Home / Self Care | Payer: 59 | Source: Ambulatory Visit | Attending: Surgery | Admitting: Surgery

## 2023-12-07 ENCOUNTER — Other Ambulatory Visit: Payer: 59

## 2023-12-07 ENCOUNTER — Ambulatory Visit
Admission: RE | Admit: 2023-12-07 | Discharge: 2023-12-07 | Disposition: A | Discharge status: Home / Self Care | Payer: 59 | Source: Ambulatory Visit | Attending: Student | Admitting: Student

## 2023-12-07 DIAGNOSIS — K3533 Acute appendicitis with perforation and localized peritonitis, with abscess: Secondary | ICD-10-CM

## 2023-12-07 HISTORY — PX: IR RADIOLOGIST EVAL & MGMT: IMG5224

## 2023-12-07 MED ORDER — IOPAMIDOL (ISOVUE-300) INJECTION 61%
100.0000 mL | Freq: Once | INTRAVENOUS | Status: AC | PRN
  Administered 2023-12-07: 100 mL via INTRAVENOUS

## 2023-12-15 NOTE — Progress Notes (Signed)
 Remote pacemaker transmission.

## 2023-12-22 ENCOUNTER — Other Ambulatory Visit (HOSPITAL_BASED_OUTPATIENT_CLINIC_OR_DEPARTMENT_OTHER): Payer: Self-pay

## 2023-12-22 MED ORDER — HYDROMORPHONE HCL 4 MG PO TABS
4.0000 mg | ORAL_TABLET | ORAL | 0 refills | Status: DC | PRN
Start: 2023-12-22 — End: 2024-03-08
  Filled 2023-12-24: qty 150, 25d supply, fill #0

## 2023-12-24 ENCOUNTER — Other Ambulatory Visit (HOSPITAL_BASED_OUTPATIENT_CLINIC_OR_DEPARTMENT_OTHER): Payer: Self-pay

## 2024-01-13 ENCOUNTER — Other Ambulatory Visit (HOSPITAL_BASED_OUTPATIENT_CLINIC_OR_DEPARTMENT_OTHER): Payer: Self-pay

## 2024-01-20 ENCOUNTER — Other Ambulatory Visit (HOSPITAL_BASED_OUTPATIENT_CLINIC_OR_DEPARTMENT_OTHER): Payer: Self-pay

## 2024-02-08 ENCOUNTER — Ambulatory Visit (INDEPENDENT_AMBULATORY_CARE_PROVIDER_SITE_OTHER): Payer: BC Managed Care – PPO

## 2024-02-08 DIAGNOSIS — I495 Sick sinus syndrome: Secondary | ICD-10-CM

## 2024-02-09 LAB — CUP PACEART REMOTE DEVICE CHECK
Battery Remaining Longevity: 66 mo
Battery Remaining Percentage: 51 %
Battery Voltage: 2.99 V
Brady Statistic AP VP Percent: 2 %
Brady Statistic AP VS Percent: 85 %
Brady Statistic AS VP Percent: 1 %
Brady Statistic AS VS Percent: 13 %
Brady Statistic RA Percent Paced: 86 %
Brady Statistic RV Percent Paced: 2 %
Date Time Interrogation Session: 20250505024103
Implantable Lead Connection Status: 753985
Implantable Lead Connection Status: 753985
Implantable Lead Implant Date: 20191031
Implantable Lead Implant Date: 20191031
Implantable Lead Location: 753859
Implantable Lead Location: 753860
Implantable Pulse Generator Implant Date: 20191031
Lead Channel Impedance Value: 560 Ohm
Lead Channel Impedance Value: 610 Ohm
Lead Channel Pacing Threshold Amplitude: 0.5 V
Lead Channel Pacing Threshold Amplitude: 0.625 V
Lead Channel Pacing Threshold Pulse Width: 0.5 ms
Lead Channel Pacing Threshold Pulse Width: 0.5 ms
Lead Channel Sensing Intrinsic Amplitude: 12 mV
Lead Channel Sensing Intrinsic Amplitude: 5 mV
Lead Channel Setting Pacing Amplitude: 0.75 V
Lead Channel Setting Pacing Amplitude: 1.625
Lead Channel Setting Pacing Pulse Width: 0.5 ms
Lead Channel Setting Sensing Sensitivity: 2 mV
Pulse Gen Model: 2272
Pulse Gen Serial Number: 9079034

## 2024-02-25 ENCOUNTER — Other Ambulatory Visit: Payer: Self-pay | Admitting: Cardiology

## 2024-02-25 DIAGNOSIS — Z79899 Other long term (current) drug therapy: Secondary | ICD-10-CM

## 2024-02-29 ENCOUNTER — Other Ambulatory Visit: Payer: Self-pay | Admitting: Cardiovascular Disease

## 2024-03-04 ENCOUNTER — Other Ambulatory Visit: Payer: Self-pay

## 2024-03-04 MED ORDER — POTASSIUM CHLORIDE ER 10 MEQ PO TBCR: 20.0000 meq | EXTENDED_RELEASE_TABLET | Freq: Every day | ORAL | 6 refills | Status: DC

## 2024-03-07 NOTE — Progress Notes (Signed)
 Electrophysiology Office Note:   Date:  03/08/2024  ID:  [name], DOB October 18, 1960, MRN [id number]  Primary Cardiologist: Janelle Mediate, MD Primary Heart Failure: None Electrophysiologist: Will Cortland Ding, MD       History of Present Illness:   [name] is a 63 y.o. female with h/o AFL, AF, HTN, HFmrEF, COPD, GERD, ETOH Cirrhosis with Esophageal Varices, Hypothyroidism seen today for routine electrophysiology followup.   Admitted to Hospital in [place] from 3/29-01/05/24 for hypotension.  She required IVF's.  Losartan , spironolactone  were stopped and metoprolol  dosing reduced to 100 mg daily.  She reports she also broke her foot and had appendicitis in the last 60 days.  She is recovering from all of the above & feels some better overall. Her BP runs 90-105 systolic at home. No dizziness / lightheadedness.   She requests to change to a different Cardiologist.    She denies chest pain, palpitations, dyspnea, PND, orthopnea, nausea, vomiting, dizziness, syncope, edema, weight gain, or early satiety.   Review of systems complete and found to be negative unless listed in HPI.   EP Information / Studies Reviewed:    EKG is not ordered today. EKG from 11/10/23 reviewed which showed AP, LBBB 71 bpm       PPM Interrogation-  reviewed in detail today,  See PACEART report.  Device History: Abbott Dual Chamber PPM implanted 08/05/2018 for Sinus Node Dysfunction  Studies:  ECHO 04/2023 > LVEF 45-50%, G1DD, normal RV    Arrhythmia / AAD AF/AFL s/p PVI, CTI ablation 09/2020    Risk Assessment/Calculations:    CHA2DS2-VASc Score = 3   This indicates a 3.2% annual risk of stroke. The patient's score is based upon: CHF History: 1 HTN History: 1 Diabetes History: 0 Stroke History: 0 Vascular Disease History: 0 Age Score: 0 Gender Score: 1             Physical Exam:   VS:  BP 106/67   Pulse 66   Ht 5\' 9"  (1.753 m)   Wt 231 lb (104.8 kg)   LMP  (LMP Unknown)   SpO2  97%   BMI 34.11 kg/m    Wt Readings from Last 3 Encounters:  03/08/24 231 lb (104.8 kg)  11/21/23 240 lb (108.9 kg)  11/12/23 254 lb 3.1 oz (115.3 kg)     GEN: chronically ill appearing adult female, well nourished, well developed in no acute distress NECK: No JVD; No carotid bruits CARDIAC: Regular rate and rhythm, no murmurs, rubs, gallops RESPIRATORY:  Clear to auscultation without rales, wheezing or rhonchi  ABDOMEN: Soft, non-tender, non-distended EXTREMITIES:  LE's with purple discoloration, old surgical scars on left foot, no significant edema  ASSESSMENT AND PLAN:    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  -Normal PPM function -See Pace Art report -No changes today  Paroxysmal Atrial Fibrillation  AFL CHA2DS2-VASc 3 -continue OAC for stroke prophylaxis  -continue Toprol  100 mg daily  -stop additional 50 mg Toprol   -<1% burden on device   Secondary Hypercoagulable State  -continue Eliquis  5mg  BID, dose reviewed and appropriate by age / wt   HFmrEF  -euvolemic on exam / no evidence of volume overload -per Cardiology   Hypertension  -well controlled on current regimen   -hold losartan  with BP  -she will need to follow up with Cardiology to review meds further > BP may not permit adding back losartan  / spiro.   Tobacco Abuse  -smoking cessation counseling   ETOH Cirrhosis /  Esophageal Varices  -BP soft as above  -no evidence of bleeding on OAC    Disposition:   Follow up with Dr. Lawana Pray in 12 months. Scheduled with Cardiology APP for follow up.  Discussed she can review website and chose a different Cardiologist and request change.    Signed, Creighton Doffing, NP-C, AGACNP-BC  [facility] - Elect[facility]iology  03/08/2024, 5:15 PM

## 2024-03-08 ENCOUNTER — Encounter: Payer: Self-pay | Admitting: Pulmonary Disease

## 2024-03-08 ENCOUNTER — Ambulatory Visit: Attending: Pulmonary Disease | Admitting: Pulmonary Disease

## 2024-03-08 VITALS — BP 106/67 | HR 66 | Ht 69.0 in | Wt 231.0 lb

## 2024-03-08 DIAGNOSIS — I5022 Chronic systolic (congestive) heart failure: Secondary | ICD-10-CM

## 2024-03-08 DIAGNOSIS — I4819 Other persistent atrial fibrillation: Secondary | ICD-10-CM

## 2024-03-08 DIAGNOSIS — I495 Sick sinus syndrome: Secondary | ICD-10-CM | POA: Diagnosis not present

## 2024-03-08 DIAGNOSIS — I483 Typical atrial flutter: Secondary | ICD-10-CM | POA: Diagnosis not present

## 2024-03-08 DIAGNOSIS — I10 Essential (primary) hypertension: Secondary | ICD-10-CM

## 2024-03-08 LAB — CUP PACEART INCLINIC DEVICE CHECK
Battery Remaining Longevity: 64 mo
Battery Voltage: 2.99 V
Brady Statistic RA Percent Paced: 86 %
Brady Statistic RV Percent Paced: 1.7 %
Date Time Interrogation Session: 20250603171948
Implantable Lead Connection Status: 753985
Implantable Lead Connection Status: 753985
Implantable Lead Implant Date: 20191031
Implantable Lead Implant Date: 20191031
Implantable Lead Location: 753859
Implantable Lead Location: 753860
Implantable Pulse Generator Implant Date: 20191031
Lead Channel Impedance Value: 525 Ohm
Lead Channel Impedance Value: 625 Ohm
Lead Channel Pacing Threshold Amplitude: 0.5 V
Lead Channel Pacing Threshold Amplitude: 0.5 V
Lead Channel Pacing Threshold Pulse Width: 0.5 ms
Lead Channel Pacing Threshold Pulse Width: 0.5 ms
Lead Channel Sensing Intrinsic Amplitude: 12 mV
Lead Channel Sensing Intrinsic Amplitude: 5 mV
Lead Channel Setting Pacing Amplitude: 0.75 V
Lead Channel Setting Pacing Amplitude: 1.5 V
Lead Channel Setting Pacing Pulse Width: 0.5 ms
Lead Channel Setting Sensing Sensitivity: 2 mV
Pulse Gen Model: 2272
Pulse Gen Serial Number: 9079034

## 2024-03-08 NOTE — Patient Instructions (Addendum)
 Medication Instructions:    Your physician recommends that you continue on your current medications as directed. Please refer to the Current Medication list given to you today.   *If you need a refill on your cardiac medications before your next appointment, please call your pharmacy*   Lab Work: NONE ORDERED  TODAY    If you have labs (blood work) drawn today and your tests are completely normal, you will receive your results only by: MyChart Message (if you have MyChart) OR A paper copy in the mail If you have any lab test that is abnormal or we need to change your treatment, we will call you to review the results.  Testing/Procedures:  NONE ORDERED  TODAY   Follow-Up: At [facility], you and your health needs are our priority.  As part of our continuing mission to provide you with exceptional heart care, our providers are all part of one team.  This team includes your primary Cardiologist (physician) and Advanced Practice Providers or APPs (Physician Assistants and Nurse Practitioners) who all work together to provide you with the care you need, when you need it.  Your next appointment:    2-4 week(s) WITH GENERAL CARDIOLOGY /APP    Provider:  IN ONE YEAR   You may see Will Cortland Ding, MD or one of the following Advanced Practice Providers on your designated Care Team:   Creighton Doffing, NP     We recommend signing up for the patient portal called "MyChart".  Sign up information is provided on this After Visit Summary.  MyChart is used to connect with patients for Virtual Visits (Telemedicine).  Patients are able to view lab/test results, encounter notes, upcoming appointments, etc.  Non-urgent messages can be sent to your provider as well.   To learn more about what you can do with MyChart, go to [email or web address].   Other Instructions

## 2024-03-18 ENCOUNTER — Ambulatory Visit: Payer: Self-pay | Admitting: Cardiology

## 2024-03-28 ENCOUNTER — Other Ambulatory Visit: Payer: Self-pay | Admitting: *Deleted

## 2024-03-28 DIAGNOSIS — I480 Paroxysmal atrial fibrillation: Secondary | ICD-10-CM

## 2024-03-28 MED ORDER — APIXABAN 5 MG PO TABS
5.0000 mg | ORAL_TABLET | Freq: Two times a day (BID) | ORAL | 1 refills | Status: AC
Start: 2024-03-28 — End: ?

## 2024-03-28 NOTE — Progress Notes (Signed)
 Remote pacemaker transmission.

## 2024-03-28 NOTE — Addendum Note (Signed)
 Addended by: TAWNI DRILLING D on: 03/28/2024 09:54 AM   Modules accepted: Orders

## 2024-03-28 NOTE — Telephone Encounter (Signed)
 Eliquis  5mg  refill request received. Patient is 63 years old, weight-104.8kg, Crea-0.83 on 11/14/23, Diagnosis-Afib, and last seen by Daphne Barrack on 03/08/24. Dose is appropriate based on dosing criteria. Will send in refill to requested pharmacy.

## 2024-04-05 ENCOUNTER — Telehealth: Payer: Self-pay | Admitting: Nurse Practitioner

## 2024-04-05 ENCOUNTER — Encounter (HOSPITAL_BASED_OUTPATIENT_CLINIC_OR_DEPARTMENT_OTHER): Payer: Self-pay | Admitting: Nurse Practitioner

## 2024-04-05 ENCOUNTER — Ambulatory Visit (HOSPITAL_BASED_OUTPATIENT_CLINIC_OR_DEPARTMENT_OTHER): Admitting: Nurse Practitioner

## 2024-04-05 VITALS — BP 114/68 | HR 67 | Ht 69.0 in | Wt 225.0 lb

## 2024-04-05 DIAGNOSIS — J42 Unspecified chronic bronchitis: Secondary | ICD-10-CM | POA: Diagnosis not present

## 2024-04-05 DIAGNOSIS — I5022 Chronic systolic (congestive) heart failure: Secondary | ICD-10-CM | POA: Diagnosis not present

## 2024-04-05 DIAGNOSIS — I2510 Atherosclerotic heart disease of native coronary artery without angina pectoris: Secondary | ICD-10-CM

## 2024-04-05 DIAGNOSIS — I420 Dilated cardiomyopathy: Secondary | ICD-10-CM

## 2024-04-05 DIAGNOSIS — G629 Polyneuropathy, unspecified: Secondary | ICD-10-CM

## 2024-04-05 DIAGNOSIS — Z720 Tobacco use: Secondary | ICD-10-CM

## 2024-04-05 DIAGNOSIS — I700 Atherosclerosis of aorta: Secondary | ICD-10-CM

## 2024-04-05 DIAGNOSIS — Z79899 Other long term (current) drug therapy: Secondary | ICD-10-CM

## 2024-04-05 DIAGNOSIS — I10 Essential (primary) hypertension: Secondary | ICD-10-CM

## 2024-04-05 NOTE — Patient Instructions (Signed)
 Medication Instructions:   Your physician recommends that you continue on your current medications as directed. Please refer to the Current Medication list given to you today.   *If you need a refill on your cardiac medications before your next appointment, please call your pharmacy*  Lab Work:  None ordered.  If you have labs (blood work) drawn today and your tests are completely normal, you will receive your results only by: MyChart Message (if you have MyChart) OR A paper copy in the mail If you have any lab test that is abnormal or we need to change your treatment, we will call you to review the results.  Testing/Procedures:  None ordered.  Follow-Up: At [facility], you and your health needs are our priority.  As part of our continuing mission to provide you with exceptional heart care, our providers are all part of one team.  This team includes your primary Cardiologist (physician) and Advanced Practice Providers or APPs (Physician Assistants and Nurse Practitioners) who all work together to provide you with the care you need, when you need it.  Your next appointment:   6 month(s)  Provider:   Shelda Bruckner, MD    We recommend signing up for the patient portal called MyChart.  Sign up information is provided on this After Visit Summary.  MyChart is used to connect with patients for Virtual Visits (Telemedicine).  Patients are able to view lab/test results, encounter notes, upcoming appointments, etc.  Non-urgent messages can be sent to your provider as well.   To learn more about what you can do with MyChart, go to [email or web address].

## 2024-04-05 NOTE — Telephone Encounter (Signed)
 Patient requests switch from Dr. Delford to Dr. Lonni

## 2024-04-05 NOTE — Progress Notes (Unsigned)
 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  History of Present Illness [name] is a very pleasant 63 year old female  who is here today for follow-up of hypertension. She is accompanied by her caregiver. She experienced a recent hospitalization due to hypotension while in [place], linked to a secondary diuretic prescribed by Dr. Emmer. This led to syncope and an ICU admission. Her blood pressure is currently 114/68 following medication adjustments, including stopping losartan , reducing metoprolol  from 150 mg to 100 mg, and discontinuing spironolactone . She is feeling well today. Home BP has been stable. She is concerned that she was started on medication without close follow-up. Echo 04/2023 showed mildly reduced heart function with an ejection fraction of 45-50%. She was started on losartan  at that time. She has been sleeping in a recliner for some time because it is more comfortable for her. Feels that COPD contributes to occasional dyspnea, especially with minimal exertion.  She denies orthopnea, PND, edema, chest pain, presyncope, syncope.  She is a smoker and has been largely sedentary due to recent health issues including a foot fracture, resulting in a 25-pound weight loss since February. No significant fluid retention or weight fluctuations are noted. She  requests to change providers from Dr. Emmer to Dr. Lonni.   ROS: See HPI  Studies Reviewed     Echo 04/29/23 1. Left ventricular ejection fraction, by estimation, is 45 to 50%. The  left ventricle has mildly decreased function. The left ventricle  demonstrates global hypokinesis. Left ventricular diastolic parameters are  consistent with Grade I diastolic  dysfunction (impaired relaxation).   2. Right ventricular systolic function is normal. The right ventricular  size is normal. There is normal pulmonary artery systolic pressure. The  estimated right ventricular systolic pressure is 25.3 mmHg.   3. The mitral valve is grossly normal. No evidence of mitral valve  regurgitation. No evidence of mitral stenosis.   4. The aortic valve is grossly normal. Aortic valve regurgitation is not  visualized. No aortic stenosis is present.   5. The inferior vena cava is normal in size with greater than 50%  respiratory variability, suggesting right atrial pressure of 3 mmHg. No results found for: LIPOA  Risk Assessment/Calculations  CHA2DS2-VASc Score = 3   This indicates a 3.2% annual risk of stroke. The patient's score is based upon: CHF History: 1 HTN History: 1 Diabetes History: 0 Stroke History: 0 Vascular Disease History: 0 Age Score: 0 Gender Score: 1            Physical Exam VS:  BP 114/68   Pulse 67   Ht 5' 9 (1.753 m)   Wt 225 lb (102.1 kg)   LMP  (LMP Unknown)   SpO2 97%   BMI 33.23 kg/m    Wt Readings from Last 3 Encounters:  04/05/24 225 lb (102.1 kg)  03/08/24 231 lb (104.8 kg)  11/21/23 240 lb (108.9 kg)  GEN: Well nourished, well developed in no acute distress NECK: No JVD; No carotid bruits CARDIAC: RRR, no murmurs, rubs, gallops RESPIRATORY:  Clear to auscultation without rales, wheezing or rhonchi  ABDOMEN: Soft, non-tender, non-distended EXTREMITIES:  No edema; No deformity   Assessment & Plan Hypertension   Admission 10/2023 due to  hypotension.  Current antihypertensive therapy includes only metoprolol .  She previously took spironolactone  and losartan , both of which were stopped following hospitalization.  Blood pressure is stable, soft at times. Continue metoprolol  100 mg daily.  PCP advised her to occasionally take spironolactone  if swelling occurs.  She is not having any can symptoms concerning for volume overload.  We will continue metoprolol ..  HFmrEF   Echocardiogram 04/2023 revealed mildly reduced ejection fraction (45-50%), G1 DD, normal RV.  She reports shortness of breath which she attributes to COPD.  She sleeps in a recliner which is most comfortable for her.  No obvious evidence of volume overload.  She reports home weight is stable.  She denies orthopnea, PND, dyspnea, chest discomfort. We will continue to monitor heart failure symptoms clinically for now. Advised her to  notify us  with concerns of increased edema, SOB, weight gain. Low sodium diet and regular physical activity encouraged.   Coronary artery calcification/aortic atherosclerosis Noted on prior CT. She denies chest pain, dyspnea, or other symptoms concerning for angina.  No indication for further ischemic evaluation at this time. Lipids not specifically addressed at visit. Aim for LDL goal < 70.    Chronic Obstructive Pulmonary Disease (COPD) Tobacco abuse   She reports COPD with occasional shortness of breath, exacerbated by smoking and a sedentary lifestyle. No acute concerns today. She continues to smoke despite encouragement to quit. Complete cessation advised. She has resources and patches which were provided by insurance. .  Neuropathy   Severe neuropathy symptoms are exacerbated by weather changes. She is scheduled for a neurosurgeon evaluation in August.    Goals of Care   She desires to switch healthcare providers due to dissatisfaction with previous follow-up care. Arrange a meeting with Dr. Dawna Bruckner before the end of the  year.        Dispo: 6 months with Dr. Bruckner  Signed, Rosaline Bane, NP-C

## 2024-05-09 ENCOUNTER — Ambulatory Visit (INDEPENDENT_AMBULATORY_CARE_PROVIDER_SITE_OTHER): Payer: BC Managed Care – PPO

## 2024-05-09 DIAGNOSIS — I495 Sick sinus syndrome: Secondary | ICD-10-CM

## 2024-05-09 LAB — CUP PACEART REMOTE DEVICE CHECK
Battery Remaining Longevity: 62 mo
Battery Remaining Percentage: 48 %
Battery Voltage: 2.98 V
Brady Statistic AP VP Percent: 2.1 %
Brady Statistic AP VS Percent: 82 %
Brady Statistic AS VP Percent: 1 %
Brady Statistic AS VS Percent: 15 %
Brady Statistic RA Percent Paced: 82 %
Brady Statistic RV Percent Paced: 2.2 %
Date Time Interrogation Session: 20250804112444
Implantable Lead Connection Status: 753985
Implantable Lead Connection Status: 753985
Implantable Lead Implant Date: 20191031
Implantable Lead Implant Date: 20191031
Implantable Lead Location: 753859
Implantable Lead Location: 753860
Implantable Pulse Generator Implant Date: 20191031
Lead Channel Impedance Value: 510 Ohm
Lead Channel Impedance Value: 580 Ohm
Lead Channel Pacing Threshold Amplitude: 0.5 V
Lead Channel Pacing Threshold Amplitude: 0.5 V
Lead Channel Pacing Threshold Pulse Width: 0.5 ms
Lead Channel Pacing Threshold Pulse Width: 0.5 ms
Lead Channel Sensing Intrinsic Amplitude: 12 mV
Lead Channel Sensing Intrinsic Amplitude: 4.7 mV
Lead Channel Setting Pacing Amplitude: 0.75 V
Lead Channel Setting Pacing Amplitude: 1.5 V
Lead Channel Setting Pacing Pulse Width: 0.5 ms
Lead Channel Setting Sensing Sensitivity: 2 mV
Pulse Gen Model: 2272
Pulse Gen Serial Number: 9079034

## 2024-05-10 ENCOUNTER — Ambulatory Visit: Payer: Self-pay | Admitting: Cardiology

## 2024-06-14 ENCOUNTER — Encounter: Payer: Self-pay | Admitting: Cardiology

## 2024-06-15 ENCOUNTER — Telehealth: Payer: Self-pay

## 2024-06-15 NOTE — Telephone Encounter (Signed)
   Pre-operative Risk Assessment    Patient Name: [name]  DOB: Jul 31, 1961 MRN: [id number]   Date of last office visit: 04/05/24 ROSALINE BANE, NP Date of next office visit: 09/20/24 SHELDA BRUCKNER, MD   Request for Surgical Clearance    Procedure:  TRANSFORMATIONAL EPIDURAL STEROID INJECTION  Date of Surgery:  Clearance 07/07/24                                Surgeon:  DAPHNE OZELL LIGHTER, MD Surgeon's Group or Practice Name:  [facility] Phone number:  [phone] Fax number:  [phone]   Type of Clearance Requested:   - Medical  - Pharmacy:  Hold Apixaban  (Eliquis ) 3-5 DAYS PRIOR   Type of Anesthesia:  Not Indicated   Additional requests/questions:    Signed, Lucie DELENA Ku   06/15/2024, 12:00 PM

## 2024-06-15 NOTE — Telephone Encounter (Signed)
 Pharmacy please advise on holding Eliquis  prior to  TRANSFORMATIONAL EPIDURAL STEROID INJECTION  scheduled for 07/07/2024.Last labs (CBC and BMET) 01/14/2024  Thank you.

## 2024-06-16 ENCOUNTER — Telehealth: Payer: Self-pay

## 2024-06-16 NOTE — Telephone Encounter (Signed)
 Patient with diagnosis of atrial fibrillation on Eliquis  for anticoagulation.    Procedure:  TRANSFORMATIONAL EPIDURAL STEROID INJECTION   Date of Surgery:  Clearance 07/07/24   CHA2DS2-VASc Score = 4   This indicates a 4.8% annual risk of stroke. The patient's score is based upon: CHF History: 1 HTN History: 1 Diabetes History: 1 Stroke History: 0 Vascular Disease History: 0 Age Score: 0 Gender Score: 1   CrCl 106 Platelet count 198  Patient has not had an Afib/aflutter ablation or Watchman within the last 3 months or DCCV within the last 30 days   Per office protocol, patient can hold Eliquis  for 3 days prior to procedure.   Patient will not need bridging with Lovenox  (enoxaparin ) around procedure.  **This guidance is not considered finalized until pre-operative APP has relayed final recommendations.**

## 2024-06-16 NOTE — Telephone Encounter (Signed)
 Med Rec and Consent done     Patient Consent for Virtual Visit        [name] has provided verbal consent on 06/16/2024 for a virtual visit (video or telephone).   CONSENT FOR VIRTUAL VISIT FOR:  [name]  By participating in this virtual visit I agree to the following:  I hereby voluntarily request, consent and authorize [facility] and its employed or contracted physicians, physician assistants, nurse practitioners or other licensed health care professionals (the Practitioner), to provide me with telemedicine health care services (the "Services) as deemed necessary by the treating Practitioner. I acknowledge and consent to receive the Services by the Practitioner via telemedicine. I understand that the telemedicine visit will involve communicating with the Practitioner through live audiovisual communication technology and the disclosure of certain medical information by electronic transmission. I acknowledge that I have been given the opportunity to request an in-person assessment or other available alternative prior to the telemedicine visit and am voluntarily participating in the telemedicine visit.  I understand that I have the right to withhold or withdraw my consent to the use of telemedicine in the course of my care at any time, without affecting my right to future care or treatment, and that the Practitioner or I may terminate the telemedicine visit at any time. I understand that I have the right to inspect all information obtained and/or recorded in the course of the telemedicine visit and may receive copies of available information for a reasonable fee.  I understand that some of the potential risks of receiving the Services via telemedicine include:  Delay or interruption in medical evaluation due to technological equipment failure or disruption; Information transmitted may not be sufficient (e.g. poor resolution of images) to allow for appropriate medical  decision making by the Practitioner; and/or  In rare instances, security protocols could fail, causing a breach of personal health information.  Furthermore, I acknowledge that it is my responsibility to provide information about my medical history, conditions and care that is complete and accurate to the best of my ability. I acknowledge that Practitioner's advice, recommendations, and/or decision may be based on factors not within their control, such as incomplete or inaccurate data provided by me or distortions of diagnostic images or specimens that may result from electronic transmissions. I understand that the practice of medicine is not an exact science and that Practitioner makes no warranties or guarantees regarding treatment outcomes. I acknowledge that a copy of this consent can be made available to me via my patient portal [facility] MyChart), or I can request a printed copy by calling the office of [facility].    I understand that my insurance will be billed for this visit.   I have read or had this consent read to me. I understand the contents of this consent, which adequately explains the benefits and risks of the Services being provided via telemedicine.  I have been provided ample opportunity to ask questions regarding this consent and the Services and have had my questions answered to my satisfaction. I give my informed consent for the services to be provided through the use of telemedicine in my medical care

## 2024-06-16 NOTE — Telephone Encounter (Signed)
 S/W pt and scheduled TELE Preop appt 06/23/24.  Med Rec and Consent done   Will update the surgeons office.

## 2024-06-16 NOTE — Telephone Encounter (Signed)
   Name: [name]  DOB: November 12, 1960  MRN: [id number]  Primary Cardiologist: Maude Emmer, MD   Preoperative team, please contact this patient and set up a phone call appointment for further preoperative risk assessment. Please obtain consent and complete medication review. Thank you for your help.  I confirm that guidance regarding antiplatelet and oral anticoagulation therapy has been completed and, if necessary, noted below.  I also confirmed the patient resides in the state of [place] . As per [facility] Medical Board telemedicine laws, the patient must reside in the state in which the provider is licensed.   Jon Garre Shonte Soderlund, PA 06/16/2024, 9:30 AM [facility]

## 2024-06-21 ENCOUNTER — Encounter: Payer: Self-pay | Admitting: Neurology

## 2024-06-21 ENCOUNTER — Encounter: Payer: Self-pay | Admitting: Gastroenterology

## 2024-06-23 ENCOUNTER — Ambulatory Visit: Attending: Cardiology | Admitting: Nurse Practitioner

## 2024-06-23 ENCOUNTER — Other Ambulatory Visit: Payer: Self-pay | Admitting: Student

## 2024-06-23 DIAGNOSIS — Z01810 Encounter for preprocedural cardiovascular examination: Secondary | ICD-10-CM | POA: Diagnosis not present

## 2024-06-23 NOTE — Progress Notes (Signed)
 Virtual Visit via Telephone Note   Because of [name] co-morbid illnesses, she is at least at moderate risk for complications without adequate follow up.  This format is felt to be most appropriate for this patient at this time.  Due to technical limitations with video connection Web designer), today's appointment will be conducted as an audio only telehealth visit, and [name] verbally agreed to proceed in this manner.   All issues noted in this document were discussed and addressed.  No physical exam could be performed with this format.  Evaluation Performed:  Preoperative cardiovascular risk assessment _____________   Date:  06/23/2024   Patient ID:  [name], DOB 09-14-1961, MRN [id number] Patient Location:  Home Provider location:   Office  Primary Care Provider:  Debrah Josette ORN., PA-C Primary Cardiologist:  Maude Emmer, MD  Chief Complaint / Patient Profile   63 y.o. y/o female with a h/o coronary artery calcification, HFmrEF, sinus node dysfunction s/p PPM, atrial fibrillation/flutter s/p ablation in 2021, hypertension, hyperlipidemia, hypothyroidism, cirrhosis with esophageal varices, GERD, spinal stenosis, BPPV, COPD, and tobacco use who is pending TRANSFORMATIONAL EPIDURAL STEROID INJECTION on 07/07/2024 with Dr. Daphne Ozell Lighter of [facility] and presents today for telephonic preoperative cardiovascular risk assessment.  History of Present Illness    [name] is a 63 y.o. female who presents via audio/video conferencing for a telehealth visit today.  Pt was last seen in cardiology clinic on 04/05/2024 by Rosaline Bane, NP.  At that time [name] was doing well. The patient is now pending procedure as outlined above. Since her last visit, she has done well from a cardiac standpoint.  Her activity is limited at baseline in the setting of significant peripheral neuropathy/nerve damage.  She ambulates short distances.  She is  currently participating physical therapy.  She denies chest pain, palpitations, dyspnea, pnd, orthopnea, n, v, dizziness, syncope, edema, weight gain, or early satiety. All other systems reviewed and are otherwise negative except as noted above.   Past Medical History    Past Medical History:  Diagnosis Date   Alcohol abuse    Allergy    Anemia    younger   Arthritis    feet,knees   B12 deficiency    BPPV (benign paroxysmal positional vertigo)    Cirrhosis (HCC)    Complication of anesthesia    slow to wake   Constipation    on movantik - stools regular and soft on this   COPD (chronic obstructive pulmonary disease) (HCC)    Foot drop    GERD (gastroesophageal reflux disease)    Hyperlipidemia    Hypothyroidism    Neuromuscular disorder (HCC)    idiopathic neuropathy   Presence of permanent cardiac pacemaker 08/05/2018   pacemaker insertion for tachy/brady syndrome   Spinal cord stimulator status    Spinal stenosis    Tobacco abuse    Past Surgical History:  Procedure Laterality Date   ANKLE SURGERY     ATRIAL FIBRILLATION ABLATION N/A 10/02/2020   Procedure: ATRIAL FIBRILLATION ABLATION;  Surgeon: Inocencio Soyla Lunger, MD;  Location: [facility];  Service: Cardiovascular;  Laterality: N/A;   CERVICAL CONE BIOPSY     CESAREAN SECTION     COLONOSCOPY     HERNIA REPAIR     has mesh, open transverse abdominal incision   IR RADIOLOGIST EVAL & MGMT  12/07/2023   KNEE SURGERY     x5   ORIF ANKLE  FRACTURE Left 06/02/2018   Procedure: OPEN REDUCTION INTERNAL FIXATION (ORIF) ANKLE FRACTURE;  Surgeon: Kit Rush, MD;  Location: [facility];  Service: Orthopedics;  Laterality: Left;   ORIF ANKLE FRACTURE Left 08/26/2018   Procedure: Left ankle syndesmosis OPEN REDUCTION INTERNAL FIXATION (ORIF)/ reconstruction of deltoid ligament;  Surgeon: Kit Rush, MD;  Location: [facility];  Service: Orthopedics;  Laterality: Left;   PACEMAKER IMPLANT N/A 08/05/2018    Procedure: PACEMAKER IMPLANT;  Surgeon: Inocencio Soyla Lunger, MD;  Location: [facility];  Service: Cardiovascular;  Laterality: N/A;   POLYPECTOMY     SPINAL CORD STIMULATOR INSERTION N/A 05/07/2016   Procedure: LUMBAR SPINAL CORD STIMULATOR INSERTION;  Surgeon: Donaciano Sprang, MD;  Location: [facility];  Service: Orthopedics;  Laterality: N/A;    Allergies  Allergies  Allergen Reactions   Ambrosia Artemisiifolia (Ragweed) Skin Test Cough   Short Ragweed Pollen Ext Other (See Comments) and Cough    Eye Redness, too    Sulfa Antibiotics Hives, Rash and Other (See Comments)    GI Upset, also    Vancomycin Hives   Teicoplanin Other (See Comments)    Trade name(s): Targocid and Ticocin = GI Upset   Azithromycin Rash    Home Medications    Prior to Admission medications   Medication Sig Start Date End Date Taking? Authorizing Provider  albuterol  (PROVENTIL  HFA;VENTOLIN  HFA) 108 (90 Base) MCG/ACT inhaler Inhale 2 puffs into the lungs every 6 (six) hours as needed for wheezing or shortness of breath.  05/14/18   [provider]  apixaban  (ELIQUIS ) 5 MG TABS tablet Take 1 tablet (5 mg total) by mouth 2 (two) times daily. 03/28/24   Nishan, Peter C, MD  B Complex Vitamins (B COMPLEX PO) Take 1 tablet by mouth daily.    [provider]  Coenzyme Q10 (CO Q 10) 100 MG CAPS Take 100 mg by mouth at bedtime.    [provider]  famotidine  (PEPCID ) 20 MG tablet Take 20 mg by mouth 2 (two) times daily. New Zantac 360T 05/26/18   [provider]  fluticasone  (FLONASE ) 50 MCG/ACT nasal spray Place 2 sprays into both nostrils as needed for allergies or rhinitis (or seasonal allergies).    [provider]  folic acid  (FOLVITE ) 1 MG tablet Take 1 mg by mouth daily. 04/01/16   [provider]  HYDROmorphone  (DILAUDID ) 4 MG tablet Take 1 tablet (4 mg total) by mouth every 4-6 hours as needed 11/30/23     magnesium  gluconate (MAGONATE) 500 MG tablet Take  500 mg by mouth at bedtime.    [provider]  metaxalone  (SKELAXIN ) 800 MG tablet Take 800 mg by mouth 2 (two) times daily. 02/20/20   [provider]  metoprolol  succinate (TOPROL -XL) 100 MG 24 hr tablet TAKE 1 TABLET BY MOUTH DAILY WITH OR IMMEDIATELY FOLLOWING A MEAL. TAKE WITH 50MG  TAB TO TOTAL 150MG  02/25/24   Camnitz, Soyla Lunger, MD  naloxone  (NARCAN ) nasal spray 4 mg/0.1 mL Take 1 spray into nostril(s) every 3 minutes until patient awakes or EMS arrives. 10/30/23     nystatin powder Apply 1 Application topically 3 (three) times daily as needed (for heat rashes).    [provider]  Olopatadine  HCl (PATADAY ) 0.7 % SOLN Place 1 drop into both eyes daily. 12/29/19   [provider]  potassium chloride  (KLOR-CON ) 10 MEQ tablet Take 2 tablets (20 mEq total) by mouth daily. 03/04/24   Lesia Ozell Barter, PA-C  pregabalin  (LYRICA )  150 MG capsule Take 150 mg by mouth See admin instructions. Take 150 mg by mouth two times a day and an additional 150 mg once day as needed for unresolved neuropathic pain 11/15/15   [provider]  PROCTOFOAM [facility] rectal foam Place 1 applicator rectally 3 (three) times daily as needed for hemorrhoids or anal itching.    [provider]  RESTASIS  MULTIDOSE 0.05 % ophthalmic emulsion Place 1 drop into both eyes in the morning and at bedtime. 03/12/20   [provider]  rosuvastatin  (CRESTOR ) 20 MG tablet Take 20 mg by mouth daily.    [provider]  senna (SENOKOT) 8.6 MG TABS tablet Take 2 tablets by mouth at bedtime.    [provider]  Sodium Chloride  Flush (NORMAL SALINE FLUSH) 0.9 % SOLN flush with 5ml twice daily 11/23/23   Suttle, Dylan J, MD  SYNTHROID  25 MCG tablet Take 25 mcg by mouth daily before breakfast.    [provider]  tiZANidine  (ZANAFLEX ) 4 MG tablet Take 8 mg by mouth at bedtime. 12/26/19   [provider]  Vitamin D , Ergocalciferol , (DRISDOL ) 50000 units  CAPS capsule Take 50,000 Units by mouth every Wednesday.  03/16/16   [provider]    Physical Exam    Vital Signs:  [name] does not have vital signs available for review today.  Given telephonic nature of communication, physical exam is limited. AAOx3. NAD. Normal affect.  Speech and respirations are unlabored.  Accessory Clinical Findings    None  Assessment & Plan    1.  Preoperative Cardiovascular Risk Assessment:  According to the Revised Cardiac Risk Index (RCRI), her Perioperative Risk of Major Cardiac Event is (%): 0.4. Her Functional Capacity in METs is: 3.63 according to the Duke Activity Status Index (DASI). Patient unable to complete greater than 4 METS at baseline due to significant peripheral neuropathy/nerve damage.  She has been stable from a cardiac standpoint.  Reviewed with Dr. Delford. Per Dr. Nishan, patient would be at acceptable risk for the planned procedure without further cardiovascular testing.  The patient was advised that if she develops new symptoms prior to surgery to contact our office to arrange for a follow-up visit, and she verbalized understanding.  Per office protocol, patient can hold Eliquis  for 3 days prior to procedure. Patient will not need bridging with Lovenox  (enoxaparin ) around procedure. Please resume Eliquis  as soon as possible postprocedure, at the discretion of the surgeon.    A copy of this note will be routed to requesting surgeon.  Time:   Today, I have spent 6 minutes with the patient with telehealth technology discussing medical history, symptoms, and management plan.     Damien JAYSON Braver, NP  06/23/2024, 11:50 AM

## 2024-07-04 NOTE — Progress Notes (Signed)
 Remote PPM Transmission

## 2024-07-08 ENCOUNTER — Telehealth: Payer: Self-pay

## 2024-07-08 NOTE — Telephone Encounter (Signed)
 Alert received:  Alert remote transmission:  HVR Event occurred 10/2 @ 22:22, duration 13sec, HR 183, V>A, >20 beats   Outreach made to Pt.  Per Pt she was asleep during time of episode.  She had received a steroid shot earlier in the day.  Per Pt she is taking Toprol  XL 100 mg daily at bedtime.  No missed doses.  Advised would forward to physician and return call to Pt if any action needed.

## 2024-07-11 NOTE — Telephone Encounter (Signed)
 Acknowledged.

## 2024-08-01 MED ORDER — POTASSIUM CHLORIDE ER 10 MEQ PO TBCR: 20.0000 meq | EXTENDED_RELEASE_TABLET | Freq: Every day | ORAL | 2 refills | Status: AC

## 2024-08-02 NOTE — Progress Notes (Unsigned)
 Initial neurology clinic note  Reason for Evaluation: Consultation requested by Veola Redell LABOR, PA-C for an opinion regarding neuropathy. My final recommendations will be communicated back to the requesting physician by way of shared medical record or letter to requesting physician via [place]  mail.  HPI: This is [name], a 63 y.o. right-handed female with a medical history of chronic low back pain, DM, afib/aflutter s/p ablation and PM (on eliquis ), HTN, HLD, GERD, vit D deficiency, hypothyroidism, OA, COPD, EtOH cirrhosis who presents to neurology clinic with the chief complaint of neuropathy. The patient is alone today.  Patient has a lot of chronic pain, which she sees pain management (Dr. Bonner at [facility]). In addition, she has pain in her feet over the last couple of years. It feels like she is walking on glass. She walks with a walker but cannot walk far due to back pain. She endorses imbalance. She had one fall in 2025 (11/2023) when she fell getting into her wheelchair. She sprained her ankle with that fall.  She mentions she had ankle surgery on the left in 2023.  She was seeing neurosurgery at [facility] for her back. 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 She does endorse some tingling in her left hand (4th and 5th digits), but no clear weakness of the hands.  In terms of low back pain, she has had multiple prior treatments that have not helped. She has done injections, PT, and spinal cord stimulator. PT has helped some in the past. She last did PT in 06/2024. She continues the home exercises.  Currently for pain patient takes zanaflex , Lyrica  150 mg BID (written for TID but she rarely takes 3), and dilaudid .   Per recent NSGY pain note:   Patient is a current smoker (1-1.5 packs per day).  She lost 25 lbs this  year over 3 months due to other health issues, but is currently stable. She can wake up in sweats but not enough to change clothes or sheet. She denies fevers.  EtOH use: 1 glass of wine with dinner  Restrictive diet? No Family history of neuropathy/myopathy/neurologic disease? Adopted, so not known   MEDICATIONS:  Outpatient Encounter Medications as of 08/03/2024  Medication Sig Note   albuterol  (PROVENTIL  HFA;VENTOLIN  HFA) 108 (90 Base) MCG/ACT inhaler Inhale 2 puffs into the lungs every 6 (six) hours as needed for wheezing or shortness of breath.     apixaban  (ELIQUIS ) 5 MG TABS tablet Take 1 tablet (5 mg total) by mouth 2 (two) times daily.    Coenzyme Q10 (CO Q 10) 100 MG CAPS Take 100 mg by mouth at bedtime.    famotidine  (PEPCID ) 20 MG tablet Take 20 mg by mouth 2 (two) times daily. New Zantac 360T    fluticasone  (FLONASE ) 50 MCG/ACT nasal spray Place 2 sprays into both nostrils as needed for allergies or rhinitis (or seasonal allergies).    folic acid  (FOLVITE ) 1 MG tablet Take 1 mg by mouth daily.    HYDROmorphone  (DILAUDID ) 4 MG tablet Take 1 tablet (4 mg total) by mouth every 4-6 hours as needed    magnesium  gluconate (MAGONATE) 500 MG tablet Take 500 mg by mouth at bedtime.    metaxalone  (SKELAXIN ) 800 MG tablet Take 800 mg by mouth 2 (two) times daily.    metoprolol  succinate (  TOPROL -XL) 100 MG 24 hr tablet TAKE 1 TABLET BY MOUTH DAILY WITH OR IMMEDIATELY FOLLOWING A MEAL. TAKE WITH 50MG  TAB TO TOTAL 150MG     naloxone  (NARCAN ) nasal spray 4 mg/0.1 mL Take 1 spray into nostril(s) every 3 minutes until patient awakes or EMS arrives.    nystatin powder Apply 1 Application topically 3 (three) times daily as needed (for heat rashes).    Olopatadine  HCl (PATADAY ) 0.7 % SOLN Place 1 drop into both eyes daily.    potassium chloride  (KLOR-CON ) 10 MEQ tablet Take 2 tablets (20 mEq total) by mouth daily.    pregabalin  (LYRICA ) 150 MG capsule Take 150 mg by mouth See admin instructions.  Take 150 mg by mouth two times a day and an additional 150 mg once day as needed for unresolved neuropathic pain (Patient taking differently: Take by mouth 2 (two) times daily. Take 150 mg by mouth two times a day and an additional 150 mg once day as needed for unresolved neuropathic pain)    PROCTOFOAM HC rectal foam Place 1 applicator rectally 3 (three) times daily as needed for hemorrhoids or anal itching.    RESTASIS  MULTIDOSE 0.05 % ophthalmic emulsion Place 1 drop into both eyes in the morning and at bedtime.    rosuvastatin  (CRESTOR ) 20 MG tablet Take 20 mg by mouth daily.    senna (SENOKOT) 8.6 MG TABS tablet Take 2 tablets by mouth at bedtime.    SYNTHROID  25 MCG tablet Take 25 mcg by mouth daily before breakfast. 11/10/2023: NAME BRAND ONLY- DAW CODE 1   tiZANidine  (ZANAFLEX ) 4 MG tablet Take 8 mg by mouth at bedtime.    Vitamin D , Ergocalciferol , (DRISDOL ) 50000 units CAPS capsule Take 50,000 Units by mouth every Wednesday.     B Complex Vitamins (B COMPLEX PO) Take 1 tablet by mouth daily. (Patient not taking: Reported on 08/03/2024)    [DISCONTINUED] Sodium Chloride  Flush (NORMAL SALINE FLUSH) 0.9 % SOLN flush with 5ml twice daily    No facility-administered encounter medications on file as of 08/03/2024.    PAST MEDICAL HISTORY: Past Medical History:  Diagnosis Date   Alcohol abuse    Allergy    Anemia    younger   Arthritis    feet,knees   B12 deficiency    BPPV (benign paroxysmal positional vertigo)    Cirrhosis (HCC)    Complication of anesthesia    slow to wake   Constipation    on movantik - stools regular and soft on this   COPD (chronic obstructive pulmonary disease) (HCC)    Foot drop    GERD (gastroesophageal reflux disease)    Hyperlipidemia    Hypothyroidism    Neuromuscular disorder (HCC)    idiopathic neuropathy   Presence of permanent cardiac pacemaker 08/05/2018   pacemaker insertion for tachy/brady syndrome   Spinal cord stimulator status     Spinal stenosis    Tobacco abuse     PAST SURGICAL HISTORY: Past Surgical History:  Procedure Laterality Date   ANKLE SURGERY     ATRIAL FIBRILLATION ABLATION N/A 10/02/2020   Procedure: ATRIAL FIBRILLATION ABLATION;  Surgeon: Inocencio Soyla Lunger, MD;  Location: [facility];  Service: Cardiovascular;  Laterality: N/A;   CERVICAL CONE BIOPSY     CESAREAN SECTION     COLONOSCOPY     HERNIA REPAIR     has mesh, open transverse abdominal incision   IR RADIOLOGIST EVAL & MGMT  12/07/2023   KNEE SURGERY  x5   ORIF ANKLE FRACTURE Left 06/02/2018   Procedure: OPEN REDUCTION INTERNAL FIXATION (ORIF) ANKLE FRACTURE;  Surgeon: Kit Rush, MD;  Location: [facility];  Service: Orthopedics;  Laterality: Left;   ORIF ANKLE FRACTURE Left 08/26/2018   Procedure: Left ankle syndesmosis OPEN REDUCTION INTERNAL FIXATION (ORIF)/ reconstruction of deltoid ligament;  Surgeon: Kit Rush, MD;  Location: [facility];  Service: Orthopedics;  Laterality: Left;   PACEMAKER IMPLANT N/A 08/05/2018   Procedure: PACEMAKER IMPLANT;  Surgeon: Inocencio Soyla Lunger, MD;  Location: [facility];  Service: Cardiovascular;  Laterality: N/A;   POLYPECTOMY     SPINAL CORD STIMULATOR INSERTION N/A 05/07/2016   Procedure: LUMBAR SPINAL CORD STIMULATOR INSERTION;  Surgeon: Donaciano Sprang, MD;  Location: [facility];  Service: Orthopedics;  Laterality: N/A;    ALLERGIES: Allergies  Allergen Reactions   Ambrosia Artemisiifolia (Ragweed) Skin Test Cough   Short Ragweed Pollen Ext Other (See Comments) and Cough    Eye Redness, too    Sulfa Antibiotics Hives, Rash and Other (See Comments)    GI Upset, also    Vancomycin Hives   Teicoplanin Other (See Comments)    Trade name(s): Targocid and Ticocin = GI Upset   Azithromycin Rash    FAMILY HISTORY: Family History  Adopted: Yes  Family history unknown: Yes    SOCIAL HISTORY: Social History   Tobacco Use   Smoking status: Every Day     Current packs/day: 1.00    Average packs/day: 1 pack/day for 30.0 years (30.0 ttl pk-yrs)    Types: Cigarettes   Smokeless tobacco: Never   Tobacco comments:    1.5 packs a day  Vaping Use   Vaping status: Never Used  Substance Use Topics   Alcohol use: Yes    Comment: goes through a pint of liquor about every 2 days/ 2 wine a night10/29/25   Drug use: No   Social History   Social History Narrative   Are you right handed or left handed? Right   Are you currently employed ?    What is your current occupation? retired   Do you live at home alone?yes   Who lives with you?    What type of home do you live in: 1 story or 2 story? three    Caffiene rarely     OBJECTIVE: PHYSICAL EXAM: BP (!) 88/55   Pulse 75   Ht 5' 9 (1.753 m)   Wt 224 lb (101.6 kg)   LMP  (LMP Unknown)   SpO2 92%   BMI 33.08 kg/m   General: General appearance: Awake and alert. No distress. Cooperative with exam.  Skin: No obvious rash or jaundice. HEENT: Atraumatic. Anicteric. Lungs: Non-labored breathing on room air  Heart: Regular Extremities: No edema. No obvious deformity.  Psych: Affect appropriate.  Neurological: Mental Status: Alert. Speech fluent. No pseudobulbar affect Cranial Nerves: CNII: No RAPD. Visual fields grossly intact. CNIII, IV, VI: PERRL. No nystagmus. EOMI. CN V: Facial sensation intact bilaterally to fine touch. CN VII: Facial muscles symmetric and strong. No ptosis at rest. CN VIII: Hearing grossly intact bilaterally. CN IX: No hypophonia. CN X: Palate elevates symmetrically. CN XI: Full strength shoulder shrug bilaterally. CN XII: Tongue protrusion full and midline. No atrophy or fasciculations. No significant dysarthria Motor: Tone is normal.  Individual muscle group testing (MRC grade out of 5):  Movement     Neck flexion 5    Neck extension 5     Right Left  Shoulder abduction 5 5   Elbow flexion 5 5   Elbow extension 5 5   Finger abduction - FDI 5 5    Finger abduction - ADM 5 5   Finger extension 5 5   Finger distal flexion - 2/3 5 5    Finger distal flexion - 4/5 5 5    Thumb flexion - FPL 5 5   Thumb abduction - APB 5 5    Hip flexion 5 5   Hip extension 5 5   Hip adduction 5 5   Hip abduction 5 5   Knee extension 5 5   Knee flexion 5 5   Dorsiflexion 5 5   Plantarflexion 5 5   Inversion 5 5   Eversion 5 5     Reflexes:  Right Left   Bicep 2+ 2+   Tricep 2+ 2+   BrRad 2+ 2+   Knee 2+ 2+ Cross adductors bilaterally  Ankle 0 0    Pathological Reflexes: Babinski: mute response bilaterally Hoffman: absent bilaterally Troemner: absent bilaterally Sensation: Pinprick: Intact in upper extremities (including ulnar side of left hand), diminished to lower calves in bilateral lower extremities Vibration: Absent in bilateral great toes, diminished in bilateral ankles, otherwise intact Proprioception: Absent in bilateral great toes Coordination: Intact finger-to- nose-finger bilaterally. Romberg with mild sway. Gait: Unable to rise from chair with arms crossed unassisted. Feet turned outward while walking. Wide base. Mildly unsteady walking with minimal assistance.  Lab and Test Review: External labs: 07/28/24: ANA negative BMP significant for glucose 107 Ceruloplasmin wnl Ferritin 71 Liver function panel: alk phos 174, normal AST and ALT  01/14/24: TSH wnl HbA1c: 6.5 CBC w/ diff unremarkable  B12 (07/04/1999): > 1500  Imaging/Procedures: CT cervical spine wo contrast (12/18/19): IMPRESSION: 1. Reversal of normal cervical lordosis may be due to positioning or muscle spasm. No acute fracture of the cervical spine. 2. Multilevel degenerative disc disease. Degenerative disc disease at C5-C6 with posterior disc osteophyte complex causing some degree of mass effect on the spinal canal.  CT head wo contrast (12/18/19): FINDINGS: Brain: No evidence of acute infarction, hemorrhage, hydrocephalus, extra-axial collection  or mass lesion/mass effect.   Vascular: No hyperdense vessel or unexpected calcification.   Skull: Normal. Negative for fracture or focal lesion.   Sinuses/Orbits: No acute finding.   Other: None.   IMPRESSION: No acute intracranial pathology.  CT lumbar spine w/ contrast - myelogram (01/02/23): IMPRESSION: 1. Progressive, advanced right-sided disc degeneration at L3-4 with a prominent right-sided disc osteophyte complex resulting in moderate spinal stenosis, moderate right lateral recess stenosis, and severe right neural foraminal stenosis. 2. Progressive, severe multilevel facet arthrosis. Grade 1 anterolisthesis develops with standing at L4-5. 3. New mild spinal stenosis and mild right neural foraminal stenosis at L4-5. 4. New mild left neural foraminal stenosis at L5-S1. 5.  Aortic Atherosclerosis (ICD10-I70.0).  External EM[facility] at [facility] by Dr. Erline (05/10/24): Summary: NCS, NMUS & EMG: NCS: Studies were performed after warming limbs to 34C with limb temperature maintained at 32-34C throughout testing. Sensory motor nerve conduction studies were performed on the left upper and lower extremities (with comparisons performed in the right sural nerve, right fibular moved to EDB). Results were pertinent for the following: 1) Sensory responses were absent in the bilateral sural nerves 2) Sensory response was absent in the left ulnar nerve 3) Motor responses were absent in the bilateral fibular nerves measured at extensor digitorum brevis, though motor responses were within normal limits in the bilateral fibular nerves  measured at tibialis anterior with normal distal latencies and CMAP amplitudes 4) Motor conduction study of the left ulnar nerve showed reduced CMAP amplitude below the elbow and above the elbow with normal CMAP amplitude at the wrist. Distal latency at the wrist, F-wave latency, and conduction velocity were normal.  EMG: Concentric needle EMG was performed of  selected muscles of the left upper and lower extremities. This was significant for evidence of active and chronic denervation changes in the left first dorsal interosseous muscle as evidenced by fibrillation potentials and positive sharp waves, as well as large amplitude / unstable polyphasic MUAPs. Chronic reinnervation changes noted in the left tibialis anterior as evidenced by polyphasic MUAPs.  Ultrasound: Ultrasound was performed of the left upper extremity focused on the ulnar nerve given findings of reduced CMAP amplitudes of the left ulnar nerve on motor nerve conduction study. This was significant for enlarged cross-sectional area of the ulnar nerve at the cubital tunnel (10.8 mm) and at the elbow (13.9 mm).  Conclusion: Abnormal study. There is evidence of the following on this study: 1) There is electrodiagnostic evidence of a length-dependent sensorimotor polyneuropathy 2) There is electrodiagnostic and sonographic evidence of a left ulnar neuropathy at the elbow   ASSESSMENT: [name] is a 63 y.o. female who presents for evaluation of neuropathy. She has a relevant medical history of chronic low back pain, DM, afib/aflutter s/p ablation and PM (on eliquis ), HTN, HLD, GERD, vit D deficiency, hypothyroidism, OA, COPD, EtOH cirrhosis. Her neurological examination is pertinent for diminished sensation in bilateral lower limbs. Available diagnostic data is significant for HbA1c of 6.5 and EMG showing length dependent large fiber polyneuropathy and left ulnar neuropathy at the elbow. The etiology of the left ulnar neuropathy is likely her leaning on her elbows which she does throughout history and physical on her wheelchair. Her known risk factors for neuropathy including mild diabetes and EtOH abuse. Her symptoms could also have contributions from chronic back pain and OA.  PLAN: -Blood work: B1, B6, B12, IFE -Fall precautions discussed -Foot care and inspection discussed -Continue  Lyrica  150 mg BID up to TID -Lidocaine  cream PRN -Alpha Lipoic acid 600 mg once or twice daily -Discussed rule of EtOH in neuropathy -Discussed not leaning on elbows and use of elbow pad for left ulnar neuropathy  -Return to clinic in 6 months  The impression above as well as the plan as outlined below were extensively discussed with the patient who voiced understanding. All questions were answered to their satisfaction.  The patient was counseled on pertinent fall precautions per the printed material provided today, and as noted under the Patient Instructions section below.  When available, results of the above investigations and possible further recommendations will be communicated to the patient via telephone/MyChart. Patient to call office if not contacted after expected testing turnaround time.   Total time spent reviewing records, interview, history/exam, documentation, and coordination of care on day of encounter:  70 min   Thank you for allowing me to participate in patient's care.  If I can answer any additional questions, I would be pleased to do so.  Venetia Potters, MD   CC: Debrah Josette ORN., PA-C [address] [place] [address]  CC: Referring provider: Veola Redell LABOR, PA-C [address] [facility] [place],  [place] [address]

## 2024-08-03 ENCOUNTER — Ambulatory Visit (INDEPENDENT_AMBULATORY_CARE_PROVIDER_SITE_OTHER): Admitting: Neurology

## 2024-08-03 ENCOUNTER — Encounter: Payer: Self-pay | Admitting: Neurology

## 2024-08-03 ENCOUNTER — Other Ambulatory Visit

## 2024-08-03 VITALS — BP 88/55 | HR 75 | Ht 69.0 in | Wt 224.0 lb

## 2024-08-03 DIAGNOSIS — G629 Polyneuropathy, unspecified: Secondary | ICD-10-CM | POA: Diagnosis not present

## 2024-08-03 DIAGNOSIS — E1142 Type 2 diabetes mellitus with diabetic polyneuropathy: Secondary | ICD-10-CM

## 2024-08-03 DIAGNOSIS — G8929 Other chronic pain: Secondary | ICD-10-CM

## 2024-08-03 DIAGNOSIS — M545 Low back pain, unspecified: Secondary | ICD-10-CM

## 2024-08-03 DIAGNOSIS — F1011 Alcohol abuse, in remission: Secondary | ICD-10-CM | POA: Diagnosis not present

## 2024-08-03 NOTE — Patient Instructions (Addendum)
 I saw you today for numbness and pain in the feet. This is neuropathy (nerve damage) as seen on the nerve testing at [facility].  You have known risk factors for neuropathy: mild diabetes and history of alcohol use.  I would like to check labs to look for other treatable causes. Controlling these risk factors will be key to lower the risk of neuropathy getting worse.  Making sure you inspect your feet daily is also important given how you do not feel them well.  You could increase your Lyrica  to 150 mg three times daily as prescribed to help with pain if you want.  You can also try Lidocaine  cream as needed. Apply wear you have pain, tingling, or burning. Wear gloves to prevent your hands being numb. This can be bought over the counter at any drug store or online.  Alpha lipoic acid 600mg  daily has some research data suggesting it helps with nerve health. No major side effects other than <1% of people report upset stomach. This can be taken twice per day (1200mg  daily) if no relief obtained. You can buy this over the counter or online.  I will be in touch when I have your lab results.  Please let me know if you have any questions or concerns in the meantime.  I will see you again in clinic in about 6 months.  The physicians and staff at [facility] are committed to providing excellent care. You may receive a survey requesting feedback about your experience at our office. We strive to receive very good responses to the survey questions. If you feel that your experience would prevent you from giving the office a very good  response, please contact our office to try to remedy the situation. We may be reached at [phone]. Thank you for taking the time out of your busy day to complete the survey.  Venetia Potters, MD [facility]  Preventing Falls at [facility] are common, often dreaded events in the lives of older people. Aside from the obvious injuries and even death that may  result, fall can cause wide-ranging consequences including loss of independence, mental decline, decreased activity and mobility. Younger people are also at risk of falling, especially those with chronic illnesses and fatigue.  Ways to reduce risk for falling Examine diet and medications. Warm foods and alcohol dilate blood vessels, which can lead to dizziness when standing. Sleep aids, antidepressants and pain medications can also increase the likelihood of a fall.  Get a vision exam. Poor vision, cataracts and glaucoma increase the chances of falling.  Check foot gear. Shoes should fit snugly and have a sturdy, nonskid sole and a broad, low heel  Participate in a physician-approved exercise program to build and maintain muscle strength and improve balance and coordination. Programs that use ankle weights or stretch bands are excellent for muscle-strengthening. Water aerobics programs and low-impact Tai Chi programs have also been shown to improve balance and coordination.  Increase vitamin D  intake. Vitamin D  improves muscle strength and increases the amount of calcium  the body is able to absorb and deposit in bones.  How to prevent falls from common hazards Floors - Remove all loose wires, cords, and throw rugs. Minimize clutter. Make sure rugs are anchored and smooth. Keep furniture in its usual place.  Chairs -- Use chairs with straight backs, armrests and firm seats. Add firm cushions to existing pieces to add height.  Bathroom - Install grab bars and non-skid tape in the tub or  shower. Use a bathtub transfer bench or a shower chair with a back support Use an elevated toilet seat and/or safety rails to assist standing from a low surface. Do not use towel racks or bathroom tissue holders to help you stand.  Lighting - Make sure halls, stairways, and entrances are well-lit. Install a night light in your bathroom or hallway. Make sure there is a light switch at the top and bottom of the  staircase. Turn lights on if you get up in the middle of the night. Make sure lamps or light switches are within reach of the bed if you have to get up during the night.  Kitchen - Install non-skid rubber mats near the sink and stove. Clean spills immediately. Store frequently used utensils, pots, pans between waist and eye level. This helps prevent reaching and bending. Sit when getting things out of lower cupboards.  Living room/ Bedrooms - Place furniture with wide spaces in between, giving enough room to move around. Establish a route through the living room that gives you something to hold onto as you walk.  Stairs - Make sure treads, rails, and rugs are secure. Install a rail on both sides of the stairs. If stairs are a threat, it might be helpful to arrange most of your activities on the lower level to reduce the number of times you must climb the stairs.  Entrances and doorways - Install metal handles on the walls adjacent to the doorknobs of all doors to make it more secure as you travel through the doorway.  Tips for maintaining balance Keep at least one hand free at all times. Try using a backpack or fanny pack to hold things rather than carrying them in your hands. Never carry objects in both hands when walking as this interferes with keeping your balance.  Attempt to swing both arms from front to back while walking. This might require a conscious effort if Parkinson's disease has diminished your movement. It will, however, help you to maintain balance and posture, and reduce fatigue.  Consciously lift your feet off of the ground when walking. Shuffling and dragging of the feet is a common culprit in losing your balance.  When trying to navigate turns, use a U technique of facing forward and making a wide turn, rather than pivoting sharply.  Try to stand with your feet shoulder-length apart. When your feet are close together for any length of time, you increase your risk of losing  your balance and falling.  Do one thing at a time. Don't try to walk and accomplish another task, such as reading or looking around. The decrease in your automatic reflexes complicates motor function, so the less distraction, the better.  Do not wear rubber or gripping soled shoes, they might catch on the floor and cause tripping.  Move slowly when changing positions. Use deliberate, concentrated movements and, if needed, use a grab bar or walking aid. Count 15 seconds between each movement. For example, when rising from a seated position, wait 15 seconds after standing to begin walking.  If balance is a continuous problem, you might want to consider a walking aid such as a cane, walking stick, or walker. Once you've mastered walking with help, you might be ready to try it on your own again.

## 2024-08-08 ENCOUNTER — Ambulatory Visit (INDEPENDENT_AMBULATORY_CARE_PROVIDER_SITE_OTHER): Payer: BC Managed Care – PPO

## 2024-08-08 DIAGNOSIS — I495 Sick sinus syndrome: Secondary | ICD-10-CM

## 2024-08-09 ENCOUNTER — Ambulatory Visit: Payer: Self-pay | Admitting: Neurology

## 2024-08-09 ENCOUNTER — Ambulatory Visit: Payer: Self-pay | Admitting: Cardiology

## 2024-08-09 LAB — CUP PACEART REMOTE DEVICE CHECK
Battery Remaining Longevity: 59 mo
Battery Remaining Percentage: 46 %
Battery Voltage: 2.98 V
Brady Statistic AP VP Percent: 1.7 %
Brady Statistic AP VS Percent: 79 %
Brady Statistic AS VP Percent: 1 %
Brady Statistic AS VS Percent: 18 %
Brady Statistic RA Percent Paced: 80 %
Brady Statistic RV Percent Paced: 1.7 %
Date Time Interrogation Session: 20251103210602
Implantable Lead Connection Status: 753985
Implantable Lead Connection Status: 753985
Implantable Lead Implant Date: 20191031
Implantable Lead Implant Date: 20191031
Implantable Lead Location: 753859
Implantable Lead Location: 753860
Implantable Pulse Generator Implant Date: 20191031
Lead Channel Impedance Value: 480 Ohm
Lead Channel Impedance Value: 580 Ohm
Lead Channel Pacing Threshold Amplitude: 0.5 V
Lead Channel Pacing Threshold Amplitude: 0.5 V
Lead Channel Pacing Threshold Pulse Width: 0.5 ms
Lead Channel Pacing Threshold Pulse Width: 0.5 ms
Lead Channel Sensing Intrinsic Amplitude: 12 mV
Lead Channel Sensing Intrinsic Amplitude: 4.2 mV
Lead Channel Setting Pacing Amplitude: 0.75 V
Lead Channel Setting Pacing Amplitude: 1.5 V
Lead Channel Setting Pacing Pulse Width: 0.5 ms
Lead Channel Setting Sensing Sensitivity: 2 mV
Pulse Gen Model: 2272
Pulse Gen Serial Number: 9079034

## 2024-08-09 LAB — VITAMIN B12: Vitamin B-12: 740 pg/mL (ref 200–1100)

## 2024-08-09 LAB — IMMUNOFIXATION ELECTROPHORESIS
IgM, Serum: 95 mg/dL (ref 50–300)
IgM, Serum: 893 mg/dL (ref 600–300)
Immunoglobulin A: 268 mg/dL (ref 70–320)
Immunoglobulin A: 893 mg/dL (ref 70–320)

## 2024-08-09 LAB — VITAMIN B6: Vitamin B6: 4.4 ng/mL (ref 2.1–21.7)

## 2024-08-09 LAB — VITAMIN B1: Vitamin B1 (Thiamine): <6 nmol/L — ABNORMAL LOW (ref 8–30)

## 2024-08-09 NOTE — Telephone Encounter (Signed)
 Patient advised of results.

## 2024-08-11 NOTE — Progress Notes (Signed)
 Remote PPM Transmission

## 2024-08-16 ENCOUNTER — Telehealth: Payer: Self-pay

## 2024-08-16 ENCOUNTER — Ambulatory Visit: Admitting: Gastroenterology

## 2024-08-16 ENCOUNTER — Encounter: Payer: Self-pay | Admitting: Gastroenterology

## 2024-08-16 VITALS — BP 100/50 | HR 68 | Ht 67.0 in | Wt 227.0 lb

## 2024-08-16 DIAGNOSIS — Z7901 Long term (current) use of anticoagulants: Secondary | ICD-10-CM | POA: Diagnosis not present

## 2024-08-16 DIAGNOSIS — J449 Chronic obstructive pulmonary disease, unspecified: Secondary | ICD-10-CM

## 2024-08-16 DIAGNOSIS — K219 Gastro-esophageal reflux disease without esophagitis: Secondary | ICD-10-CM | POA: Diagnosis not present

## 2024-08-16 DIAGNOSIS — K8020 Calculus of gallbladder without cholecystitis without obstruction: Secondary | ICD-10-CM

## 2024-08-16 DIAGNOSIS — K7030 Alcoholic cirrhosis of liver without ascites: Secondary | ICD-10-CM

## 2024-08-16 DIAGNOSIS — Z950 Presence of cardiac pacemaker: Secondary | ICD-10-CM

## 2024-08-16 NOTE — Patient Instructions (Signed)
 _______________________________________________________  If your blood pressure at your visit was 140/90 or greater, please contact your primary care physician to follow up on this.  _______________________________________________________  If you are age 62 or older, your body mass index should be between 23-30. Your Body mass index is 35.55 kg/m. If this is out of the aforementioned range listed, please consider follow up with your Primary Care Provider.  If you are age 57 or younger, your body mass index should be between 19-25. Your Body mass index is 35.55 kg/m. If this is out of the aformentioned range listed, please consider follow up with your Primary Care Provider.   ________________________________________________________  The [facility] providers would like to encourage you to use MYCHART to communicate with providers for non-urgent requests or questions.  Due to long hold times on the telephone, sending your provider a message by [facility] may be a faster and more efficient way to get a response.  Please allow 48 business hours for a response.  Please remember that this is for non-urgent requests.  _______________________________________________________  [facility] is using a team-based approach to care.  Your team is made up of your doctor and two to three APPS. Our APPS (Nurse Practitioners and Physician Assistants) work with your physician to ensure care continuity for you. They are fully qualified to address your health concerns and develop a treatment plan. They communicate directly with your gastroenterologist to care for you. Seeing the Advanced Practice Practitioners on your physician's team can help you by facilitating care more promptly, often allowing for earlier appointments, access to diagnostic testing, procedures, and other specialty referrals.   You will be contacted by our office prior to your procedure for directions on holding your Eliquis   If you do not  hear from our office 2 week prior to your scheduled procedure, please call [phone] to discuss.   You have been scheduled for an endoscopy. Please follow written instructions given to you at your visit today.  If you use inhalers (even only as needed), please bring them with you on the day of your procedure.  If you take any of the following medications, they will need to be adjusted prior to your procedure:   DO NOT TAKE 7 DAYS PRIOR TO TEST- Trulicity (dulaglutide) Ozempic, Wegovy (semaglutide) Mounjaro, Zepbound (tirzepatide) Bydureon Bcise (exanatide extended release)  DO NOT TAKE 1 DAY PRIOR TO YOUR TEST Rybelsus (semaglutide) Adlyxin (lixisenatide) Victoza (liraglutide) Byetta (exanatide) ___________________________________________________________________________

## 2024-08-16 NOTE — Telephone Encounter (Signed)
  [facility] Pre-operative Risk Assessment     Request for surgical clearance:     Endoscopy Procedure  What type of surgery is being performed?     EGD  When is this surgery scheduled?     09-19-24  What type of clearance is required ?   Pharmacy  Are there any medications that need to be held prior to surgery and how long? Eliquis  2 day hold  Practice name and name of physician performing surgery?      [facility]  What is your office phone and fax number?      Phone- [phone]  Fax- [phone]  Anesthesia type (None, local, MAC, general) ?       MAC   Please route your response to [facility])

## 2024-08-16 NOTE — Progress Notes (Signed)
 "  [name] [id number] [birth year]   Chief Complaint: Trouble swallowing, cirrhosis  Referring Provider: Debrah Josette ORN., PA-C Primary GI MD: Dr. Shila  HPI: [name] is a 63 y.o. female with past medical history of alcoholic cirrhosis, vitamin B12 deficiency, constipation, COPD, GERD, HLD, hypothyroidism, atrial fibrillation s/p ablation 2021 and on Eliquis , presence of cardiac pacemaker who presents today for a complaint of dysphagia.    Patient follows with Stephane Quest, NP at [facility] liver clinic.  She is referred for EGD to screen for varices. Patient with cirrhosis on imaging since at least 2010.  No history of decompensation.  LFT stable other than elevated alkaline phosphatase following percutaneous drainage of intra-abdominal abscess which is resolved based on most recent imaging.  No ascites on recent imaging.  Single paracentesis 2009 with 6.2 L drawn.  This was in the setting of GI illness during which she developed jaundice and ascites.  Had been drinking up to 2 weeks prior.  Labs stabilized with alcohol cessation. Admitted to the hospital February 2025 with intra-abdominal abscess presumably arising from the appendix which was treated with percutaneous drainage and antibiotics.  Seen by general surgery March 2025 who felt she would be high risk for complications postoperatively and in the absence of appendicoliths or symptoms that it would be acceptable to monitor clinically rather than pursuing appendectomy. At time of last visit with hepatology 03/28/2024, her most recent alcohol use was the day prior and reported current consumption of approximately 1/5 of schnapps per month, started drinking again due to stress.  No history of variceal bleeding.  Most recent EGD 2009 with portal hypertensive gastropathy. Noted to have hematochezia with known hemorrhoids.  Does have history of ascites with paracentesis in 2009 only, not on diuretics.  No history of  encephalopathy.  Most recent abdominal imaging was a single phase contrast CT of the abdomen done with no focal liver lesions, no splenomegaly, no ascites, and recanalized umbilical vein.  Plan at last visit with hepatology was to repeat labs, repeat a complete abdominal ultrasound for hepatoma screening, and refer to GI to schedule surveillance endoscopy.  Abdominal ultrasound 04/26/2024 showed cirrhosis without focal lesion of the liver, cholelithiasis without evidence of cholecystitis, and mild dilation of the CBD at 9 mm with recommendation for correlation with LFTs and consideration of MRCP given presence of cholelithiasis. Unclear what follow-up has been done for this.  Labs 07/28/2024:  AFP 4.6 Alk phos 174, AST 26, ALT 16, albumin 3.7, total bilirubin 0.4 Normal iron, ferritin 71 Negative hep B but nonimmune Normal ceruloplasmin Cr 0.82, Sodium 143 Positive ANA ASMA 6 Negative alpha 1 antitrypsin deficiency Peth positive 198  Most recent MELD 3.0 of 9 presuming normal INR (on Eliquis ).  Normal CBC in April.   Discussed the use of AI scribe software for clinical note transcription with the patient, who gave verbal consent to proceed.  History of Present Illness [name] is a 63 year old female with cirrhosis who presents for screening of esophageal varices. She was referred by Stephane Quest at [facility] for screening of esophageal varices.  Cirrhosis and portal hypertension screening - Cirrhosis with ongoing alcohol use, though intake has been reduced - Last drink yesterday - Full workup completed to rule out other causes of cirrhosis - No fever, chills, nausea, vomiting, gastrointestinal bleeding, rashes, pruritus, jaundice, or confusion - History of paracentesis in 2009 for abdominal fluid removal - Referred for screening of esophageal varices - Last upper  endoscopy in 2009 showed no varices but some gastropathy  Dysphagia - Difficulty swallowing large  pills, sensation of pills getting stuck at the back of the throat - No difficulty swallowing food - Strong gag reflex  Gastrointestinal symptoms and medication use - No acid reflux or heartburn - Takes Pepcid  20 mg, possibly three times daily, for prevention  Opioid-induced constipation - Constipation attributed to opioid use - Managed with laxatives and stool softeners, resulting in daily bowel movements  Colorectal cancer screening - Last colonoscopy in 2017 showed benign polyps - On a ten-year repeat schedule  Abdominal pain - History of appendicitis earlier this year - Occasional twinges in the right lower quadrant, no persistent pain  Cardiac history and anticoagulation - Atrial flutter, on Eliquis  - Pacemaker in place - History of ablation - No history of myocardial infarction or cerebrovascular accident - Hospitalized in March for hypotension attributed to overprescription of diuretics - Not currently on diuretics - Takes metoprolol , which can lower blood pressure  Mobility and functional status - Wheelchair user, able to ambulate short distances   Unintentional weight loss - Lost 25 pounds over three months - Attributed to multiple life stressors including bereavement, child moving out, and health issues  Implanted devices and imaging limitations - Spinal stimulator in place, unable to undergo MRI - Plate and screws in ankle  Anesthesia recovery - Slow recovery from anesthesia   Previous GI Procedures/Imaging   Abdominal ultrasound 04/26/2024 1.  Cirrhotic morphology of the liver without focal lesion.  2.  Cholelithiasis without evidence for cholecystitis.  3.  Mild dilation of the CBD at 9 mm. Suggest correlation with LFTs. Given the presence of cholelithiasis consider MRCP for further assessment.   CT A/P 12/07/2023 1. Resolution of previously visualized right lower quadrant abscess. The previously placed pigtail drainage catheter has been retracted to the  right lower quadrant transverse abdominus musculature. 2. Dilated appendix with minimal surrounding fat stranding, likely reactive. 3. Cirrhosis with sequela of portal hypertension including recanalized paraumbilical vein. No evidence of hepatoma. 4. Cholelithiasis without evidence of acute cholecystitis. 5. Diverticulosis without evidence of diverticulitis. 6.  Aortic Atherosclerosis (ICD10-I70.0).  Colonoscopy 10/02/2016 (Dr. Shila) - One 5 mm polyp in the rectum, removed with a cold snare. Resected and retrieved.  - One 3 mm polyp in the rectum, removed with a cold biopsy forceps. Resected and retrieved.  - Diverticulosis in the sigmoid colon and in the descending colon.  - Non- bleeding internal hemorrhoids.  - The examination was otherwise normal. - Recall 10 years Path: Diagnosis Surgical [P], rectum, polyp (2) - HYPERPLASTIC POLYP (2). NO ADENOMATOUS CHANGE OR MALIGNANCY  EGD 04/04/2008 (Dr. Obie) No esophageal varices, diffuse gastropathy likely due to alcohol and/or portal hypertension, 1 cm hiatal hernia  Past Medical History:  Diagnosis Date   Alcohol abuse    Allergy    Anemia    younger   Anxiety    Arthritis    feet,knees   B12 deficiency    BPPV (benign paroxysmal positional vertigo)    CHF (congestive heart failure) (HCC)    Cirrhosis (HCC)    Complication of anesthesia    slow to wake   Constipation    on movantik - stools regular and soft on this   COPD (chronic obstructive pulmonary disease) (HCC)    DM (diabetes mellitus) (HCC)    Foot drop    GERD (gastroesophageal reflux disease)    Hyperlipidemia    Hypothyroidism    Neuromuscular disorder (HCC)  idiopathic neuropathy   Presence of permanent cardiac pacemaker 08/05/2018   pacemaker insertion for tachy/brady syndrome   Spinal cord stimulator status    Spinal stenosis    Tobacco abuse     Past Surgical History:  Procedure Laterality Date   ATRIAL FIBRILLATION ABLATION N/A  10/02/2020   Procedure: ATRIAL FIBRILLATION ABLATION;  Surgeon: Inocencio Soyla Lunger, MD;  Location: [facility];  Service: Cardiovascular;  Laterality: N/A;   CERVICAL CONE BIOPSY     CESAREAN SECTION     COLONOSCOPY     IR RADIOLOGIST EVAL & MGMT  12/07/2023   KNEE ARTHROSCOPY Right    x 2   KNEE ARTHROSCOPY Left    x 3   lumbar nerve ablation     ORIF ANKLE FRACTURE Left 06/02/2018   Procedure: OPEN REDUCTION INTERNAL FIXATION (ORIF) ANKLE FRACTURE;  Surgeon: Kit Rush, MD;  Location: [facility];  Service: Orthopedics;  Laterality: Left;   ORIF ANKLE FRACTURE Left 08/26/2018   Procedure: Left ankle syndesmosis OPEN REDUCTION INTERNAL FIXATION (ORIF)/ reconstruction of deltoid ligament;  Surgeon: Kit Rush, MD;  Location: [facility];  Service: Orthopedics;  Laterality: Left;   PACEMAKER IMPLANT N/A 08/05/2018   Procedure: PACEMAKER IMPLANT;  Surgeon: Inocencio Soyla Lunger, MD;  Location: [facility];  Service: Cardiovascular;  Laterality: N/A;   POLYPECTOMY     SPINAL CORD STIMULATOR INSERTION N/A 05/07/2016   Procedure: LUMBAR SPINAL CORD STIMULATOR INSERTION;  Surgeon: Donaciano Sprang, MD;  Location: [facility];  Service: Orthopedics;  Laterality: N/A;   UMBILICAL HERNIA REPAIR     has mesh, open transverse abdominal incision    Current Outpatient Medications  Medication Sig Dispense Refill   ALPHA LIPOIC ACID PO Take 1 tablet by mouth daily.     fluticasone  (FLONASE ) 50 MCG/ACT nasal spray Place 2 sprays into both nostrils as needed for allergies or rhinitis (or seasonal allergies). As needed in the spring     Thiamine  HCl (VITAMIN B1 PO) Take 1 tablet by mouth daily.     albuterol  (PROVENTIL  HFA;VENTOLIN  HFA) 108 (90 Base) MCG/ACT inhaler Inhale 2 puffs into the lungs every 6 (six) hours as needed for wheezing or shortness of breath.      apixaban  (ELIQUIS ) 5 MG TABS tablet Take 1 tablet (5 mg total) by mouth 2 (two) times daily. 180 tablet 1   Coenzyme Q10  (CO Q 10) 100 MG CAPS Take 100 mg by mouth at bedtime.     famotidine  (PEPCID ) 20 MG tablet Take 20 mg by mouth 2 (two) times daily. New Zantac 360  0   folic acid  (FOLVITE ) 1 MG tablet Take 1 mg by mouth daily.     HYDROmorphone  (DILAUDID ) 4 MG tablet Take 1 tablet (4 mg total) by mouth every 4-6 hours as needed 150 tablet 0   magnesium  gluconate (MAGONATE) 500 MG tablet Take 500 mg by mouth at bedtime.     metaxalone  (SKELAXIN ) 800 MG tablet Take 800 mg by mouth 2 (two) times daily.     metoprolol  succinate (TOPROL -XL) 100 MG 24 hr tablet TAKE 1 TABLET BY MOUTH DAILY WITH OR IMMEDIATELY FOLLOWING A MEAL. TAKE WITH 50MG  TAB TO TOTAL 150MG  90 tablet 1   naloxone  (NARCAN ) nasal spray 4 mg/0.1 mL Take 1 spray into nostril(s) every 3 minutes until patient awakes or EMS arrives. 2 each 0   nystatin powder Apply 1 Application topically 3 (three) times daily as needed (for heat rashes).  Olopatadine  HCl (PATADAY ) 0.7 % SOLN Place 1 drop into both eyes daily.     potassium chloride  (KLOR-CON ) 10 MEQ tablet Take 2 tablets (20 mEq total) by mouth daily. 180 tablet 2   pregabalin  (LYRICA ) 150 MG capsule Take 150 mg by mouth See admin instructions. Take 150 mg by mouth two times a day and an additional 150 mg once day as needed for unresolved neuropathic pain (Patient taking differently: Take by mouth 2 (two) times daily. Take 150 mg by mouth two times a day and an additional 150 mg once day as needed for unresolved neuropathic pain)     PROCTOFOAM HC rectal foam Place 1 applicator rectally 3 (three) times daily as needed for hemorrhoids or anal itching.     RESTASIS  MULTIDOSE 0.05 % ophthalmic emulsion Place 1 drop into both eyes in the morning and at bedtime.     rosuvastatin  (CRESTOR ) 20 MG tablet Take 20 mg by mouth daily.     senna (SENOKOT) 8.6 MG TABS tablet Take 2 tablets by mouth at bedtime.     SYNTHROID  25 MCG tablet Take 25 mcg by mouth daily before breakfast.     tiZANidine  (ZANAFLEX ) 4 MG  tablet Take 8 mg by mouth at bedtime.     Vitamin D , Ergocalciferol , (DRISDOL ) 50000 units CAPS capsule Take 50,000 Units by mouth every Wednesday.      No current facility-administered medications for this visit.    Allergies as of 08/16/2024 - Review Complete 08/16/2024  Allergen Reaction Noted   Ambrosia artemisiifolia (ragweed) skin test Cough 06/12/2020   Short ragweed pollen ext Other (See Comments) and Cough 12/14/2019   Sulfa antibiotics Hives, Rash, and Other (See Comments) 03/31/2016   Vancomycin Hives 03/14/2008   Teicoplanin Other (See Comments) 11/28/2015   Azithromycin Rash 02/15/2021    Family History  Adopted: Yes  Family history unknown: Yes    Social History   Tobacco Use   Smoking status: Every Day    Current packs/day: 1.00    Average packs/day: 1 pack/day for 30.0 years (30.0 ttl pk-yrs)    Types: Cigarettes   Smokeless tobacco: Never   Tobacco comments:    1.5 packs a day  Vaping Use   Vaping status: Never Used  Substance Use Topics   Alcohol use: Yes    Comment: goes through a pint of liquor about every 2 days/ 2 wine a night10/29/25   Drug use: No     Review of Systems:    Constitutional: Reported weight loss. No fever, chills Cardiovascular: No chest pain Respiratory: No SOB Gastrointestinal: See HPI and otherwise negative   Physical Exam:  Vital signs: BP (!) 100/50 (BP Location: Left Wrist, Patient Position: Sitting, Cuff Size: Normal)   Pulse 68   Ht 5' 7 (1.702 m) Comment: height measured without shoes  Wt 227 lb (103 kg)   LMP  (LMP Unknown)   BMI 35.55 kg/m   Wt Readings from Last 3 Encounters:  08/16/24 227 lb (103 kg)  08/03/24 224 lb (101.6 kg)  04/05/24 225 lb (102.1 kg)    Constitutional: Pleasant, well appearing, NAD, alert and cooperative, ambulating with wheelchair but able to walk short distances Head:  Normocephalic and atraumatic.  Eyes: No scleral icterus. Respiratory: Respirations even and unlabored. Lungs  clear to auscultation bilaterally.  No wheezes, crackles, or rhonchi.  Cardiovascular:  Regular rate and rhythm. No murmurs. No peripheral edema. Gastrointestinal:  Soft, nondistended, nontender. No rebound or guarding. Normal bowel sounds. No appreciable  masses  Rectal:  Not performed.  Neurologic:  Alert and oriented x4;  grossly normal neurologically. Negative asterixis Skin:   Dry and intact without significant lesions or rashes. Psychiatric: Oriented to person, place and time. Demonstrates good judgement and reason without abnormal affect or behaviors.   RELEVANT LABS AND IMAGING: CBC    Component Value Date/Time   WBC 4.4 11/14/2023 0744   RBC 3.22 (L) 11/14/2023 0744   HGB 10.9 (L) 11/14/2023 0744   HGB 16.1 (H) 11/13/2022 1159   HCT 33.4 (L) 11/14/2023 0744   HCT 46.8 (H) 11/13/2022 1159   PLT 159 11/14/2023 0744   PLT 158 11/13/2022 1159   MCV 103.7 (H) 11/14/2023 0744   MCV 99 (H) 11/13/2022 1159   MCH 33.9 11/14/2023 0744   MCHC 32.6 11/14/2023 0744   RDW 14.8 11/14/2023 0744   RDW 12.8 11/13/2022 1159   LYMPHSABS 1.2 11/10/2023 1730   MONOABS 0.8 11/10/2023 1730   EOSABS 0.1 11/10/2023 1730   BASOSABS 0.0 11/10/2023 1730    CMP     Component Value Date/Time   NA 136 11/14/2023 0744   NA 139 08/31/2023 1149   K 3.8 11/14/2023 0744   CL 103 11/14/2023 0744   CO2 24 11/14/2023 0744   GLUCOSE 205 (H) 11/14/2023 0744   BUN 7 (L) 11/14/2023 0744   BUN 22 08/31/2023 1149   CREATININE 0.83 11/14/2023 0744   CALCIUM  8.8 (L) 11/14/2023 0744   PROT 6.8 11/11/2023 0630   ALBUMIN 2.8 (L) 11/11/2023 0630   AST 62 (H) 11/11/2023 0630   ALT 38 11/11/2023 0630   ALKPHOS 132 (H) 11/11/2023 0630   BILITOT 1.0 11/11/2023 0630   GFRNONAA >60 11/14/2023 0744   GFRAA 86 09/18/2020 1606   Echocardiogram 04/29/2023 1. Left ventricular ejection fraction, by estimation, is 45 to 50% . The left ventricle has mildly decreased function. The left ventricle demonstrates global  hypokinesis. Left ventricular diastolic parameters are consistent with Grade I diastolic dysfunction ( impaired relaxation) .  2. Right ventricular systolic function is normal. The right ventricular size is normal. There is normal pulmonary artery systolic pressure. The estimated right ventricular systolic pressure is 25. 3 mmHg.  3. The mitral valve is grossly normal. No evidence of mitral valve regurgitation. No evidence of mitral stenosis.  4. The aortic valve is grossly normal. Aortic valve regurgitation is not visualized. No aortic stenosis is present.  5. The inferior vena cava is normal in size with greater than 50% respiratory variability, suggesting right atrial pressure of 3 mmHg.   Assessment/Plan:   Assessment & Plan Alcoholic cirrhosis of the liver Cirrhosis due to alcohol use. MELD score stable. Ultrasound showed mild common bile duct dilation to 9 mm. Ordered with liver clinic.  Unclear whether there is plan for follow-up of this.  Patient not having any RUQ pain or jaundice.  MRI not feasible due to presence of implanted devices.  - Monitor liver function and MELD score. - Encouraged alcohol cessation - Follow-up with [facility].  Screening for esophageal varices in cirrhosis Screening for esophageal varices due to cirrhosis. Last endoscopy in 2009 showed no varices.  - Schedule upper endoscopy for variceal screening. I thoroughly discussed the procedure with the patient to include nature of the procedure, alternatives, benefits, and risks (including but not limited to bleeding, infection, perforation, anesthesia/cardiac/pulmonary complications). Patient verbalized understanding and gave verbal consent to proceed with procedure.   Dysphagia for pills (oropharyngeal dysphagia) Difficulty swallowing large pills, not food.  -  Continue management with smaller pills if possible. Evaluate further with EGD.  Constipation due to opioid use Constipation managed with  laxatives and stool softeners. Daily bowel movements.  - Continue laxatives and stool softeners as needed.  Atrial flutter, status post ablation, with pacemaker Atrial flutter managed with pacemaker and ablation. On Eliquis  for anticoagulation.  - Coordinate with cardiology to hold Eliquis  prior to procedure.  History of appendicitis, non-operatively managed Appendicitis managed non-operatively. Occasional right lower quadrant twinges.  History of hypotensive episode (low blood pressure) Recent hypotensive episode. On metoprolol , may contribute to low blood pressure.  - Monitor blood pressure regularly.  Impaired mobility, ambulatory with assistance, uses wheelchair Impaired mobility due to multiple factors. Walks short distances with assistance.  Gastroesophageal reflux disease, controlled with medication GERD controlled with Pepcid .  - Continue Pepcid  as prescribed.   Camie Furbish, PA-C [facility] 08/16/2024, 1:43 PM  Patient Care Team: Debrah Josette ORN., PA-C as PCP - General (Family Medicine) Delford Maude BROCKS, MD as PCP - Cardiology (Cardiology) Inocencio Soyla Lunger, MD as PCP - Electrophysiology (Cardiology) Sheffield, Andrez SAUNDERS, PA-C (Inactive) as Physician Assistant (Dermatology)   "

## 2024-08-17 ENCOUNTER — Encounter: Payer: Self-pay | Admitting: Gastroenterology

## 2024-08-17 NOTE — Telephone Encounter (Signed)
 Pt has been scheduled in office appt with Daphne Barrack, NP preop clearance 08/26/24 @ 10:55.

## 2024-08-17 NOTE — Telephone Encounter (Signed)
 Pt returned call. Please advise.

## 2024-08-17 NOTE — Telephone Encounter (Signed)
   Name: [name]  DOB: November 21, 1960  MRN: [id number]  Primary Cardiologist: Maude Emmer, MD  Chart reviewed as part of pre-operative protocol coverage. Because of Elysabeth Aust Birr's past medical history and time since last visit, she will require a follow-up in-office visit in order to better assess preoperative cardiovascular risk.   Patient was recently identified to have VT on device interrogation prompting increase in beta blocker. Recommend scheduling follow-up with EP to review if suitable to proceed with EGD.   Pre-op  covering staff: - Please schedule appointment and call patient to inform them. If patient already had an upcoming appointment within acceptable timeframe, please add pre-op  clearance to the appointment notes so provider is aware. - Please contact requesting surgeon's office via preferred method (i.e, phone, fax) to inform them of need for appointment prior to surgery.  This message will also be routed to pharmacy pool for input on holding Eliquis  as requested below so that this information is available to the clearing provider at time of patient's appointment.   Ethelle Ola N Iisha Soyars, PA-C  08/17/2024, 8:31 AM

## 2024-08-17 NOTE — Telephone Encounter (Addendum)
 Left detailed message to call back to preop as to discuss about appt. Pt can see APP for preop as I did not have anything open to move up Dr. Lonni appt; orthe pt may want to move her GI procedure and see Dr. Lonni 09/20/24 as planned.    Happy to schedule appt with APP for preop if pt chooses too.   I will update the GI office as well.

## 2024-08-18 ENCOUNTER — Encounter: Payer: Self-pay | Admitting: Gastroenterology

## 2024-08-25 NOTE — Progress Notes (Addendum)
 Electrophysiology Office Note:   Date:  08/26/2024  ID:  [name], DOB 07-27-1961, MRN [id number]  Primary Cardiologist: Maude Emmer, MD Primary Heart Failure: None Electrophysiologist: Will Gladis Norton, MD       History of Present Illness:   [name] is a 63 y.o. female with h/o AFL, AF, HTN, HFmrEF, COPD, GERD, ETOH Cirrhosis with Esophageal Varices, Hypothyroidism seen today for routine electrophysiology followup.   Device Clinic received a HVR alert 07/07/24 for a 13 sec episode in duration with V>A, pt was asleep / asymptomatic, had received steroid injection into her back the same day.  She had been at [facility] all day and was worn out.  She is pending an endoscopy with GI - no acute bleeding issues / routine testing.    Since last being seen in our clinic the patient reports she has had a lot going on in the last 2 years. She lost her husband and is just now preparing to sell their home. She has been cleaning it out.  Her legs are really bothering her and her back. She is in a wheelchair today.   She denies chest pain, palpitations, dyspnea, PND, orthopnea, nausea, vomiting, dizziness, syncope, edema, weight gain, or early satiety.   Review of systems complete and found to be negative unless listed in HPI.   EP Information / Studies Reviewed:    EKG is ordered today. Personal review as below.  EKG Interpretation Date/Time:  Friday August 26 2024 10:55:04 EST Ventricular Rate:  68 PR Interval:  210 QRS Duration:  148 QT Interval:  458 QTC Calculation: 487 R Axis:   -63  Text Interpretation: Sinus rhythm with 1st degree A-V block with Premature atrial complexes Left axis deviation Left bundle branch block Confirmed by Aniceto Jarvis ([id number]) on 08/26/2024 11:36:49 AM   PPM Interrogation-  reviewed in detail today,  See PACEART report.  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 The patient's score is based upon: CHF History: 1 HTN History: 1 Diabetes History: 1 Stroke History: 0 Vascular Disease History: 0 Age Score: 0 Gender Score: 1             Physical Exam:   VS:  BP 111/72 (BP Location: Left Wrist, Patient Position: Sitting, Cuff Size: Normal)   Pulse 68   Ht 5' 7 (1.702 m)   Wt 223 lb (101.2 kg)   LMP  (LMP Unknown)   SpO2 93%   BMI 34.93 kg/m    Wt Readings from Last 3 Encounters:  08/26/24 223 lb (101.2 kg)  08/16/24 227 lb (103 kg)  08/03/24 224 lb (101.6 kg)     GEN: Well nourished, well developed in no acute distress NECK: No JVD; No carotid bruits CARDIAC: Regular rate and rhythm, no murmurs, rubs, gallops. Device site wnl, no tethering.  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  -Normal PPM function -See Pace Art report -No changes today -AP 79%, VP 1.6%  VT  -seen on device, HVR alert 07/07/24 on device, 13 sec in duration  with V>A, pt was asleep / asymptomatic, had received steroids the same day -she was planned to increase Toprol  to 150 mg daily but did not get a prescription for the additional 50 mg and is afraid of her low BP with her liver issues (this is why she stopped it last year) -BMP 07/28/24 with stable electrolytes  -no missed meds   -update ECHO given reduced LVEF, VT, LBBB QRS >140 ms > may need device upgrade pending results   Paroxysmal Atrial Fibrillation  AFL  CHA2DS2-VASc 4  -OAC for stroke prophylaxis -Toprol  100 mg daily  -<1% burden on device   Secondary Hypercoagulable State  -continue Eliquis  5mg  BID, dose reviewed and  appropriate by age / wt   HFmrEF  -euvolemic on exam   Hypertension  -well controlled on current regimen   Tobacco Abuse  -increased smoking with stress > 1.5-2ppd, encouraged cessation   ETOH Cirrhosis / Esophageal Varices  -no bleeding issues on Eliquis   -ongoing ETOH use as of 11/11/ GI notes    Pre-Operative Risk Assessment  Request for surgical clearance:     Endoscopy Procedure When is this surgery scheduled?     09-19-24 What type of clearance is required ?   Pharmacy Are there any medications that need to be held prior to surgery and how long? Eliquis  2 day hold Practice name and name of physician performing surgery?      [facility] What is your office phone and fax number?  Phone- [phone]  Fax- [phone] Anesthesia type (None, local, MAC, general) ?    MAC      [name] perioperative risk of a major cardiac event is 0.9% according to the Revised Cardiac Risk Index (RCRI).  Therefore, she is at low risk for perioperative complications based on RCRI. However, given her baseline medical issues, she likely is low-moderate risk.   Her functional capacity is fair at 5.29 METs according to the Duke Activity Status Index (DASI).  Recommendations: The patient requires an echocardiogram before a disposition can be made regarding surgical risk.                  Please route your response to [facility])  Disposition:   Follow up with Dr. Inocencio in 12 months for PPM follow up. May need sooner pending ECHO results.   Signed, Daphne Barrack, NP-C, AGACNP-BC [facility] - Electrophysiology  08/26/2024, 11:36 AM   Addendum 09/13/24  ECHO reviewed, unchanged from prior.  Patient is at acceptable risk as above to move forward with endoscopy as above.  Information sent to requesting team.   Daphne Barrack, NP-C, AGACNP-BC [facility] - Electrophysiology  09/13/2024, 12:26 PM

## 2024-08-26 ENCOUNTER — Encounter: Payer: Self-pay | Admitting: Pulmonary Disease

## 2024-08-26 ENCOUNTER — Ambulatory Visit: Attending: Pulmonary Disease | Admitting: Pulmonary Disease

## 2024-08-26 VITALS — BP 111/72 | HR 68 | Ht 67.0 in | Wt 223.0 lb

## 2024-08-26 DIAGNOSIS — Z01810 Encounter for preprocedural cardiovascular examination: Secondary | ICD-10-CM

## 2024-08-26 DIAGNOSIS — I480 Paroxysmal atrial fibrillation: Secondary | ICD-10-CM | POA: Diagnosis not present

## 2024-08-26 DIAGNOSIS — I499 Cardiac arrhythmia, unspecified: Secondary | ICD-10-CM | POA: Diagnosis not present

## 2024-08-26 DIAGNOSIS — Z950 Presence of cardiac pacemaker: Secondary | ICD-10-CM

## 2024-08-26 DIAGNOSIS — I5022 Chronic systolic (congestive) heart failure: Secondary | ICD-10-CM

## 2024-08-26 DIAGNOSIS — I495 Sick sinus syndrome: Secondary | ICD-10-CM | POA: Diagnosis not present

## 2024-08-26 DIAGNOSIS — I483 Typical atrial flutter: Secondary | ICD-10-CM

## 2024-08-26 DIAGNOSIS — D6869 Other thrombophilia: Secondary | ICD-10-CM

## 2024-08-26 LAB — CUP PACEART INCLINIC DEVICE CHECK
Battery Remaining Longevity: 58 mo
Battery Voltage: 2.98 V
Brady Statistic RA Percent Paced: 79 %
Brady Statistic RV Percent Paced: 1.5 %
Date Time Interrogation Session: 20251121131624
Implantable Lead Connection Status: 753985
Implantable Lead Connection Status: 753985
Implantable Lead Implant Date: 20191031
Implantable Lead Implant Date: 20191031
Implantable Lead Location: 753859
Implantable Lead Location: 753860
Implantable Pulse Generator Implant Date: 20191031
Lead Channel Impedance Value: 525 Ohm
Lead Channel Impedance Value: 625 Ohm
Lead Channel Pacing Threshold Amplitude: 0.5 V
Lead Channel Pacing Threshold Amplitude: 0.625 V
Lead Channel Pacing Threshold Pulse Width: 0.5 ms
Lead Channel Pacing Threshold Pulse Width: 0.5 ms
Lead Channel Sensing Intrinsic Amplitude: 12 mV
Lead Channel Sensing Intrinsic Amplitude: 4.6 mV
Lead Channel Setting Pacing Amplitude: 0.75 V
Lead Channel Setting Pacing Amplitude: 1.625
Lead Channel Setting Pacing Pulse Width: 0.5 ms
Lead Channel Setting Sensing Sensitivity: 2 mV
Pulse Gen Model: 2272
Pulse Gen Serial Number: 9079034

## 2024-08-26 MED ORDER — METOPROLOL SUCCINATE ER 25 MG PO TB24
25.0000 mg | ORAL_TABLET | Freq: Every day | ORAL | 3 refills | Status: AC
Start: 2024-08-26 — End: ?

## 2024-08-26 NOTE — Patient Instructions (Addendum)
 Medication Instructions:  We will add back an additional 25 mg of Toprol  in the am to help prevent the ventricular arrhythmias. If you do not tolerate it from a blood pressure standpoint, please let me know. Ok to stop if you are having dizziness or BP <90 consistently.    *If you need a refill on your cardiac medications before your next appointment, please call your pharmacy*  Lab Work: No lab work today If you have labs (blood work) drawn today and your tests are completely normal, you will receive your results only by: MyChart Message (if you have MyChart) OR A paper copy in the mail If you have any lab test that is abnormal or we need to change your treatment, we will call you to review the results.  Testing/Procedures:[address] Your physician has requested that you have an echocardiogram. Echocardiography is a painless test that uses sound waves to create images of your heart. It provides your doctor with information about the size and shape of your heart and how well your heart's chambers and valves are working. This procedure takes approximately one hour. There are no restrictions for this procedure. Please do NOT wear cologne, perfume, aftershave, or lotions (deodorant is allowed). Please arrive 15 minutes prior to your appointment time.  Please note: We ask at that you not bring children with you during ultrasound (echo/ vascular) testing. Due to room size and safety concerns, children are not allowed in the ultrasound rooms during exams. Our front office staff cannot provide observation of children in our lobby area while testing is being conducted. An adult accompanying a patient to their appointment will only be allowed in the ultrasound room at the discretion of the ultrasound technician under special circumstances. We apologize for any inconvenience.   We will update your ECHO before we can clear you for the endoscopy. Depending on what it shows, we may have to address the  type of device you have. If we need to change the device, we will be in touch.   Follow-Up: At [facility], you and your health needs are our priority.  As part of our continuing mission to provide you with exceptional heart care, our providers are all part of one team.  This team includes your primary Cardiologist (physician) and Advanced Practice Providers or APPs (Physician Assistants and Nurse Practitioners) who all work together to provide you with the care you need, when you need it.  Your next appointment:   1 year(s) for PPM follow up and as needed   Provider:   You may see Will Gladis Norton, MD or one of the following Advanced Practice Providers on your designated Care Team:    Daphne Barrack, NP    We recommend signing up for the patient portal called MyChart.  Sign up information is provided on this After Visit Summary.  MyChart is used to connect with patients for Virtual Visits (Telemedicine).  Patients are able to view lab/test results, encounter notes, upcoming appointments, etc.  Non-urgent messages can be sent to your provider as well.   To learn more about what you can do with MyChart, go to [email or web address].

## 2024-08-29 NOTE — Telephone Encounter (Signed)
 Patient with diagnosis of afib on Eliquis  for anticoagulation.    Procedure: EGD Date of procedure: 09/19/24   CHA2DS2-VASc Score = 4   This indicates a 4.8% annual risk of stroke. The patient's score is based upon: CHF History: 1 HTN History: 1 Diabetes History: 1 Stroke History: 0 Vascular Disease History: 0 Age Score: 0 Gender Score: 1      CrCl 80 ml/min Platelet count 198  Patient has not had an Afib/aflutter ablation in the last 3 months, DCCV within the last 4 weeks or a watchman implanted in the last 45 days   Per office protocol, patient can hold Eliquis  for 2 days prior to procedure.    **This guidance is not considered finalized until pre-operative APP has relayed final recommendations.**

## 2024-08-31 ENCOUNTER — Ambulatory Visit: Payer: Self-pay | Admitting: Cardiology

## 2024-09-07 ENCOUNTER — Ambulatory Visit: Attending: Pulmonary Disease

## 2024-09-09 ENCOUNTER — Encounter: Payer: Self-pay | Admitting: Cardiology

## 2024-09-09 ENCOUNTER — Ambulatory Visit (HOSPITAL_COMMUNITY): Admission: RE | Admit: 2024-09-09 | Discharge: 2024-09-09 | Attending: Pulmonary Disease | Admitting: Pulmonary Disease

## 2024-09-09 DIAGNOSIS — I499 Cardiac arrhythmia, unspecified: Secondary | ICD-10-CM

## 2024-09-09 DIAGNOSIS — I509 Heart failure, unspecified: Secondary | ICD-10-CM

## 2024-09-09 LAB — ECHOCARDIOGRAM COMPLETE
Area-P 1/2: 3.08 cm2
S' Lateral: 2.8 cm

## 2024-09-09 MED ORDER — PERFLUTREN LIPID MICROSPHERE
1.0000 mL | INTRAVENOUS | Status: AC | PRN
  Administered 2024-09-09: 2 mL via INTRAVENOUS

## 2024-09-12 ENCOUNTER — Encounter: Payer: Self-pay | Admitting: Gastroenterology

## 2024-09-13 NOTE — Telephone Encounter (Signed)
 I found it. Thank you so much!! I made the patient aware and she voiced understanding

## 2024-09-13 NOTE — Telephone Encounter (Signed)
 Im following up on this clearance. Please advise

## 2024-09-13 NOTE — Telephone Encounter (Signed)
 Hi Brooke,   I sent it over to you in a separate message.  Please do not hesitate to reach out if you do not get it or need something further.  Thank you!  Daphne

## 2024-09-15 ENCOUNTER — Ambulatory Visit: Admitting: Neurology

## 2024-09-15 ENCOUNTER — Telehealth: Payer: Self-pay | Admitting: Cardiology

## 2024-09-15 DIAGNOSIS — Z79899 Other long term (current) drug therapy: Secondary | ICD-10-CM

## 2024-09-15 NOTE — Telephone Encounter (Signed)
°*  STAT* If patient is at the pharmacy, call can be transferred to refill team.   1. Which medications need to be refilled? (please list name of each medication and dose if known)   metoprolol  succinate (TOPROL -XL) 100 MG 24 hr tablet     4. Which pharmacy/location (including street and city if local pharmacy) is medication to be sent to?  [id number] - [place], [place] - [address]     5. Do they need a 30 day or 90 day supply? 90

## 2024-09-16 MED ORDER — METOPROLOL SUCCINATE ER 100 MG PO TB24: 100.0000 mg | ORAL_TABLET | Freq: Every day | ORAL | 1 refills | Status: AC

## 2024-09-16 NOTE — Telephone Encounter (Signed)
 Refill sent

## 2024-09-18 ENCOUNTER — Other Ambulatory Visit: Payer: Self-pay | Admitting: Cardiovascular Disease

## 2024-09-18 DIAGNOSIS — I480 Paroxysmal atrial fibrillation: Secondary | ICD-10-CM

## 2024-09-19 ENCOUNTER — Ambulatory Visit: Admitting: Gastroenterology

## 2024-09-19 VITALS — BP 120/65 | HR 68 | Temp 98.2°F | Ht 67.0 in | Wt 227.0 lb

## 2024-09-19 DIAGNOSIS — K7030 Alcoholic cirrhosis of liver without ascites: Secondary | ICD-10-CM

## 2024-09-19 MED ORDER — SODIUM CHLORIDE 0.9 % IV SOLN: 500.0000 mL | INTRAVENOUS | Status: DC

## 2024-09-19 MED ORDER — DEXTROSE 5 % IV SOLN: INTRAVENOUS | Status: AC

## 2024-09-19 NOTE — Progress Notes (Signed)
 Pt's states no medical or surgical changes since previsit or office visit.

## 2024-09-19 NOTE — Progress Notes (Signed)
 Pt rescheduled for 09/20/24 with Dr. Abran. Instructions printed and reviewed with pt. Instructed pt to continue to hold Eliquis  until after EGD tomorrow. If pt needs to cancel tomorrow d/t care partner not being available she has been instructed to restart her Eliquis . Pt verbalized understanding.

## 2024-09-19 NOTE — Telephone Encounter (Signed)
 Prescription refill request for Eliquis  received. Indication:afib Last office visit:11/25 Scr: 0.82  10/25 Age:63 Weight:101.2  kg  Prescription refilled

## 2024-09-19 NOTE — Progress Notes (Signed)
 During assessment for upper endoscopy procedure, Pt reported that she ate a honey bun this morning at 0900. She stated that the office spoke with her about stopping her Eliquis  but not instructions on when to stop eating prior to procedure. Reported to CRNA and Md. Pt to be rescheduled for another date. Pt will restart Eliquis  per MD.

## 2024-09-20 ENCOUNTER — Encounter: Payer: Self-pay | Admitting: Internal Medicine

## 2024-09-20 ENCOUNTER — Ambulatory Visit (HOSPITAL_BASED_OUTPATIENT_CLINIC_OR_DEPARTMENT_OTHER): Admitting: Cardiology

## 2024-09-20 ENCOUNTER — Ambulatory Visit: Admitting: Internal Medicine

## 2024-09-20 VITALS — BP 99/56 | HR 70 | Temp 98.2°F | Resp 15 | Ht 67.0 in | Wt 227.0 lb

## 2024-09-20 DIAGNOSIS — K3189 Other diseases of stomach and duodenum: Secondary | ICD-10-CM

## 2024-09-20 DIAGNOSIS — I8500 Esophageal varices without bleeding: Secondary | ICD-10-CM

## 2024-09-20 DIAGNOSIS — K219 Gastro-esophageal reflux disease without esophagitis: Secondary | ICD-10-CM

## 2024-09-20 DIAGNOSIS — K7030 Alcoholic cirrhosis of liver without ascites: Secondary | ICD-10-CM

## 2024-09-20 MED ORDER — SODIUM CHLORIDE 0.9 % IV SOLN: 500.0000 mL | Freq: Once | INTRAVENOUS | Status: DC

## 2024-09-20 NOTE — Patient Instructions (Signed)

## 2024-09-20 NOTE — Progress Notes (Signed)
 Sedate, gd SR, tolerated procedure well, VSS, report to RN

## 2024-09-20 NOTE — Progress Notes (Signed)
 Expand All Collapse All    [name] [id number] [birth year]     Chief Complaint: Trouble swallowing, cirrhosis   Referring Provider: Debrah Josette ORN., PA-C Primary GI MD: Dr. Shila   HPI: [name] is a 63 y.o. female with past medical history of alcoholic cirrhosis, vitamin B12 deficiency, constipation, COPD, GERD, HLD, hypothyroidism, atrial fibrillation s/p ablation 2021 and on Eliquis , presence of cardiac pacemaker who presents today for a complaint of dysphagia.     Patient follows with Stephane Quest, NP at [facility] liver clinic.  She is referred for EGD to screen for varices. Patient with cirrhosis on imaging since at least 2010.  No history of decompensation.  LFT stable other than elevated alkaline phosphatase following percutaneous drainage of intra-abdominal abscess which is resolved based on most recent imaging.  No ascites on recent imaging.  Single paracentesis 2009 with 6.2 L drawn.  This was in the setting of GI illness during which she developed jaundice and ascites.  Had been drinking up to 2 weeks prior.  Labs stabilized with alcohol cessation. Admitted to the hospital February 2025 with intra-abdominal abscess presumably arising from the appendix which was treated with percutaneous drainage and antibiotics.  Seen by general surgery March 2025 who felt she would be high risk for complications postoperatively and in the absence of appendicoliths or symptoms that it would be acceptable to monitor clinically rather than pursuing appendectomy. At time of last visit with hepatology 03/28/2024, her most recent alcohol use was the day prior and reported current consumption of approximately 1/5 of schnapps per month, started drinking again due to stress.   No history of variceal bleeding.  Most recent EGD 2009 with portal hypertensive gastropathy. Noted to have hematochezia with known hemorrhoids.   Does have history of ascites with paracentesis in 2009 only, not on  diuretics.   No history of encephalopathy.   Most recent abdominal imaging was a single phase contrast CT of the abdomen done with no focal liver lesions, no splenomegaly, no ascites, and recanalized umbilical vein.   Plan at last visit with hepatology was to repeat labs, repeat a complete abdominal ultrasound for hepatoma screening, and refer to GI to schedule surveillance endoscopy.   Abdominal ultrasound 04/26/2024 showed cirrhosis without focal lesion of the liver, cholelithiasis without evidence of cholecystitis, and mild dilation of the CBD at 9 mm with recommendation for correlation with LFTs and consideration of MRCP given presence of cholelithiasis. Unclear what follow-up has been done for this.   Labs 07/28/2024:  AFP 4.6 Alk phos 174, AST 26, ALT 16, albumin 3.7, total bilirubin 0.4 Normal iron, ferritin 71 Negative hep B but nonimmune Normal ceruloplasmin Cr 0.82, Sodium 143 Positive ANA ASMA 6 Negative alpha 1 antitrypsin deficiency Peth positive 198   Most recent MELD 3.0 of 9 presuming normal INR (on Eliquis ).   Normal CBC in April.     Discussed the use of AI scribe software for clinical note transcription with the patient, who gave verbal consent to proceed.   History of Present Illness [name] is a 63 year old female with cirrhosis who presents for screening of esophageal varices. She was referred by Stephane Quest at [facility] for screening of esophageal varices.   Cirrhosis and portal hypertension screening - Cirrhosis with ongoing alcohol use, though intake has been reduced - Last drink yesterday - Full workup completed to rule out other causes of cirrhosis - No fever, chills, nausea, vomiting, gastrointestinal bleeding, rashes, pruritus,  jaundice, or confusion - History of paracentesis in 2009 for abdominal fluid removal - Referred for screening of esophageal varices - Last upper endoscopy in 2009 showed no varices but some gastropathy    Dysphagia - Difficulty swallowing large pills, sensation of pills getting stuck at the back of the throat - No difficulty swallowing food - Strong gag reflex   Gastrointestinal symptoms and medication use - No acid reflux or heartburn - Takes Pepcid  20 mg, possibly three times daily, for prevention   Opioid-induced constipation - Constipation attributed to opioid use - Managed with laxatives and stool softeners, resulting in daily bowel movements   Colorectal cancer screening - Last colonoscopy in 2017 showed benign polyps - On a ten-year repeat schedule   Abdominal pain - History of appendicitis earlier this year - Occasional twinges in the right lower quadrant, no persistent pain   Cardiac history and anticoagulation - Atrial flutter, on Eliquis  - Pacemaker in place - History of ablation - No history of myocardial infarction or cerebrovascular accident - Hospitalized in March for hypotension attributed to overprescription of diuretics - Not currently on diuretics - Takes metoprolol , which can lower blood pressure   Mobility and functional status - Wheelchair user, able to ambulate short distances    Unintentional weight loss - Lost 25 pounds over three months - Attributed to multiple life stressors including bereavement, child moving out, and health issues   Implanted devices and imaging limitations - Spinal stimulator in place, unable to undergo MRI - Plate and screws in ankle   Anesthesia recovery - Slow recovery from anesthesia     Previous GI Procedures/Imaging    Abdominal ultrasound 04/26/2024 1.  Cirrhotic morphology of the liver without focal lesion.  2.  Cholelithiasis without evidence for cholecystitis.  3.  Mild dilation of the CBD at 9 mm. Suggest correlation with LFTs. Given the presence of cholelithiasis consider MRCP for further assessment.    CT A/P 12/07/2023 1. Resolution of previously visualized right lower quadrant abscess. The previously  placed pigtail drainage catheter has been retracted to the right lower quadrant transverse abdominus musculature. 2. Dilated appendix with minimal surrounding fat stranding, likely reactive. 3. Cirrhosis with sequela of portal hypertension including recanalized paraumbilical vein. No evidence of hepatoma. 4. Cholelithiasis without evidence of acute cholecystitis. 5. Diverticulosis without evidence of diverticulitis. 6.  Aortic Atherosclerosis (ICD10-I70.0).   Colonoscopy 10/02/2016 (Dr. Shila) - One 5 mm polyp in the rectum, removed with a cold snare. Resected and retrieved.  - One 3 mm polyp in the rectum, removed with a cold biopsy forceps. Resected and retrieved.  - Diverticulosis in the sigmoid colon and in the descending colon.  - Non- bleeding internal hemorrhoids.  - The examination was otherwise normal. - Recall 10 years Path: Diagnosis Surgical [P], rectum, polyp (2) - HYPERPLASTIC POLYP (2). NO ADENOMATOUS CHANGE OR MALIGNANCY   EGD 04/04/2008 (Dr. Obie) No esophageal varices, diffuse gastropathy likely due to alcohol and/or portal hypertension, 1 cm hiatal hernia       Past Medical History:  Diagnosis Date   Alcohol abuse     Allergy     Anemia      younger   Anxiety     Arthritis      feet,knees   B12 deficiency     BPPV (benign paroxysmal positional vertigo)     CHF (congestive heart failure) (HCC)     Cirrhosis (HCC)     Complication of anesthesia      slow to wake  Constipation      on movantik - stools regular and soft on this   COPD (chronic obstructive pulmonary disease) (HCC)     DM (diabetes mellitus) (HCC)     Foot drop     GERD (gastroesophageal reflux disease)     Hyperlipidemia     Hypothyroidism     Neuromuscular disorder (HCC)      idiopathic neuropathy   Presence of permanent cardiac pacemaker 08/05/2018    pacemaker insertion for tachy/brady syndrome   Spinal cord stimulator status     Spinal stenosis     Tobacco abuse                  Past Surgical History:  Procedure Laterality Date   ATRIAL FIBRILLATION ABLATION N/A 10/02/2020    Procedure: ATRIAL FIBRILLATION ABLATION;  Surgeon: Inocencio Soyla Lunger, MD;  Location: [facility];  Service: Cardiovascular;  Laterality: N/A;   CERVICAL CONE BIOPSY       CESAREAN SECTION       COLONOSCOPY       IR RADIOLOGIST EVAL & MGMT   12/07/2023   KNEE ARTHROSCOPY Right      x 2   KNEE ARTHROSCOPY Left      x 3   lumbar nerve ablation       ORIF ANKLE FRACTURE Left 06/02/2018    Procedure: OPEN REDUCTION INTERNAL FIXATION (ORIF) ANKLE FRACTURE;  Surgeon: Kit Rush, MD;  Location: [facility];  Service: Orthopedics;  Laterality: Left;   ORIF ANKLE FRACTURE Left 08/26/2018    Procedure: Left ankle syndesmosis OPEN REDUCTION INTERNAL FIXATION (ORIF)/ reconstruction of deltoid ligament;  Surgeon: Kit Rush, MD;  Location: [facility];  Service: Orthopedics;  Laterality: Left;   PACEMAKER IMPLANT N/A 08/05/2018    Procedure: PACEMAKER IMPLANT;  Surgeon: Inocencio Soyla Lunger, MD;  Location: [facility];  Service: Cardiovascular;  Laterality: N/A;   POLYPECTOMY       SPINAL CORD STIMULATOR INSERTION N/A 05/07/2016    Procedure: LUMBAR SPINAL CORD STIMULATOR INSERTION;  Surgeon: Donaciano Sprang, MD;  Location: [facility];  Service: Orthopedics;  Laterality: N/A;   UMBILICAL HERNIA REPAIR        has mesh, open transverse abdominal incision                Current Outpatient Medications  Medication Sig Dispense Refill   ALPHA LIPOIC ACID PO Take 1 tablet by mouth daily.       fluticasone  (FLONASE ) 50 MCG/ACT nasal spray Place 2 sprays into both nostrils as needed for allergies or rhinitis (or seasonal allergies). As needed in the spring       Thiamine  HCl (VITAMIN B1 PO) Take 1 tablet by mouth daily.       albuterol  (PROVENTIL  HFA;VENTOLIN  HFA) 108 (90 Base) MCG/ACT inhaler Inhale 2 puffs into the lungs every 6 (six) hours as needed for wheezing or  shortness of breath.        apixaban  (ELIQUIS ) 5 MG TABS tablet Take 1 tablet (5 mg total) by mouth 2 (two) times daily. 180 tablet 1   Coenzyme Q10 (CO Q 10) 100 MG CAPS Take 100 mg by mouth at bedtime.       famotidine  (PEPCID ) 20 MG tablet Take 20 mg by mouth 2 (two) times daily. New Zantac 360   0   folic acid  (FOLVITE ) 1 MG tablet Take 1 mg by mouth daily.       HYDROmorphone  (DILAUDID ) 4 MG tablet Take  1 tablet (4 mg total) by mouth every 4-6 hours as needed 150 tablet 0   magnesium  gluconate (MAGONATE) 500 MG tablet Take 500 mg by mouth at bedtime.       metaxalone  (SKELAXIN ) 800 MG tablet Take 800 mg by mouth 2 (two) times daily.       metoprolol  succinate (TOPROL -XL) 100 MG 24 hr tablet TAKE 1 TABLET BY MOUTH DAILY WITH OR IMMEDIATELY FOLLOWING A MEAL. TAKE WITH 50MG  TAB TO TOTAL 150MG  90 tablet 1   naloxone  (NARCAN ) nasal spray 4 mg/0.1 mL Take 1 spray into nostril(s) every 3 minutes until patient awakes or EMS arrives. 2 each 0   nystatin powder Apply 1 Application topically 3 (three) times daily as needed (for heat rashes).       Olopatadine  HCl (PATADAY ) 0.7 % SOLN Place 1 drop into both eyes daily.       potassium chloride  (KLOR-CON ) 10 MEQ tablet Take 2 tablets (20 mEq total) by mouth daily. 180 tablet 2   pregabalin  (LYRICA ) 150 MG capsule Take 150 mg by mouth See admin instructions. Take 150 mg by mouth two times a day and an additional 150 mg once day as needed for unresolved neuropathic pain (Patient taking differently: Take by mouth 2 (two) times daily. Take 150 mg by mouth two times a day and an additional 150 mg once day as needed for unresolved neuropathic pain)       PROCTOFOAM HC rectal foam Place 1 applicator rectally 3 (three) times daily as needed for hemorrhoids or anal itching.       RESTASIS  MULTIDOSE 0.05 % ophthalmic emulsion Place 1 drop into both eyes in the morning and at bedtime.       rosuvastatin  (CRESTOR ) 20 MG tablet Take 20 mg by mouth daily.       senna  (SENOKOT) 8.6 MG TABS tablet Take 2 tablets by mouth at bedtime.       SYNTHROID  25 MCG tablet Take 25 mcg by mouth daily before breakfast.       tiZANidine  (ZANAFLEX ) 4 MG tablet Take 8 mg by mouth at bedtime.       Vitamin D , Ergocalciferol , (DRISDOL ) 50000 units CAPS capsule Take 50,000 Units by mouth every Wednesday.           No current facility-administered medications for this visit.             Allergies as of 08/16/2024 - Review Complete 08/16/2024  Allergen Reaction Noted   Ambrosia artemisiifolia (ragweed) skin test Cough 06/12/2020   Short ragweed pollen ext Other (See Comments) and Cough 12/14/2019   Sulfa antibiotics Hives, Rash, and Other (See Comments) 03/31/2016   Vancomycin Hives 03/14/2008   Teicoplanin Other (See Comments) 11/28/2015   Azithromycin Rash 02/15/2021      Family History  Adopted: Yes  Family history unknown: Yes          Social History  Social History         Tobacco Use   Smoking status: Every Day      Current packs/day: 1.00      Average packs/day: 1 pack/day for 30.0 years (30.0 ttl pk-yrs)      Types: Cigarettes   Smokeless tobacco: Never   Tobacco comments:      1.5 packs a day  Vaping Use   Vaping status: Never Used  Substance Use Topics   Alcohol use: Yes      Comment: goes through a pint of liquor about every 2  days/ 2 wine a night10/29/25   Drug use: No        Review of Systems:    Constitutional: Reported weight loss. No fever, chills Cardiovascular: No chest pain Respiratory: No SOB Gastrointestinal: See HPI and otherwise negative    Physical Exam:  Vital signs: BP (!) 100/50 (BP Location: Left Wrist, Patient Position: Sitting, Cuff Size: Normal)   Pulse 68   Ht 5' 7 (1.702 m) Comment: height measured without shoes  Wt 227 lb (103 kg)   LMP  (LMP Unknown)   BMI 35.55 kg/m       Wt Readings from Last 3 Encounters:  08/16/24 227 lb (103 kg)  08/03/24 224 lb (101.6 kg)  04/05/24 225 lb (102.1 kg)     Constitutional: Pleasant, well appearing, NAD, alert and cooperative, ambulating with wheelchair but able to walk short distances Head:  Normocephalic and atraumatic.  Eyes: No scleral icterus. Respiratory: Respirations even and unlabored. Lungs clear to auscultation bilaterally.  No wheezes, crackles, or rhonchi.  Cardiovascular:  Regular rate and rhythm. No murmurs. No peripheral edema. Gastrointestinal:  Soft, nondistended, nontender. No rebound or guarding. Normal bowel sounds. No appreciable masses  Rectal:  Not performed.  Neurologic:  Alert and oriented x4;  grossly normal neurologically. Negative asterixis Skin:   Dry and intact without significant lesions or rashes. Psychiatric: Oriented to person, place and time. Demonstrates good judgement and reason without abnormal affect or behaviors.     RELEVANT LABS AND IMAGING: CBC Labs (Brief)          Component Value Date/Time    WBC 4.4 11/14/2023 0744    RBC 3.22 (L) 11/14/2023 0744    HGB 10.9 (L) 11/14/2023 0744    HGB 16.1 (H) 11/13/2022 1159    HCT 33.4 (L) 11/14/2023 0744    HCT 46.8 (H) 11/13/2022 1159    PLT 159 11/14/2023 0744    PLT 158 11/13/2022 1159    MCV 103.7 (H) 11/14/2023 0744    MCV 99 (H) 11/13/2022 1159    MCH 33.9 11/14/2023 0744    MCHC 32.6 11/14/2023 0744    RDW 14.8 11/14/2023 0744    RDW 12.8 11/13/2022 1159    LYMPHSABS 1.2 11/10/2023 1730    MONOABS 0.8 11/10/2023 1730    EOSABS 0.1 11/10/2023 1730    BASOSABS 0.0 11/10/2023 1730        CMP     Labs (Brief)          Component Value Date/Time    NA 136 11/14/2023 0744    NA 139 08/31/2023 1149    K 3.8 11/14/2023 0744    CL 103 11/14/2023 0744    CO2 24 11/14/2023 0744    GLUCOSE 205 (H) 11/14/2023 0744    BUN 7 (L) 11/14/2023 0744    BUN 22 08/31/2023 1149    CREATININE 0.83 11/14/2023 0744    CALCIUM  8.8 (L) 11/14/2023 0744    PROT 6.8 11/11/2023 0630    ALBUMIN 2.8 (L) 11/11/2023 0630    AST 62 (H) 11/11/2023 0630    ALT 38  11/11/2023 0630    ALKPHOS 132 (H) 11/11/2023 0630    BILITOT 1.0 11/11/2023 0630    GFRNONAA >60 11/14/2023 0744    GFRAA 86 09/18/2020 1606      Echocardiogram 04/29/2023 1. Left ventricular ejection fraction, by estimation, is 45 to 50% . The left ventricle has mildly decreased function. The left ventricle demonstrates global hypokinesis. Left ventricular diastolic parameters are consistent  with Grade I diastolic dysfunction ( impaired relaxation) .  2. Right ventricular systolic function is normal. The right ventricular size is normal. There is normal pulmonary artery systolic pressure. The estimated right ventricular systolic pressure is 25. 3 mmHg.  3. The mitral valve is grossly normal. No evidence of mitral valve regurgitation. No evidence of mitral stenosis.  4. The aortic valve is grossly normal. Aortic valve regurgitation is not visualized. No aortic stenosis is present.  5. The inferior vena cava is normal in size with greater than 50% respiratory variability, suggesting right atrial pressure of 3 mmHg.     Assessment/Plan:    Assessment & Plan Alcoholic cirrhosis of the liver Cirrhosis due to alcohol use. MELD score stable. Ultrasound showed mild common bile duct dilation to 9 mm. Ordered with liver clinic.  Unclear whether there is plan for follow-up of this.  Patient not having any RUQ pain or jaundice.  MRI not feasible due to presence of implanted devices.   - Monitor liver function and MELD score. - Encouraged alcohol cessation - Follow-up with [facility].   Screening for esophageal varices in cirrhosis Screening for esophageal varices due to cirrhosis. Last endoscopy in 2009 showed no varices.   - Schedule upper endoscopy for variceal screening. I thoroughly discussed the procedure with the patient to include nature of the procedure, alternatives, benefits, and risks (including but not limited to bleeding, infection, perforation, anesthesia/cardiac/pulmonary  complications). Patient verbalized understanding and gave verbal consent to proceed with procedure.    Dysphagia for pills (oropharyngeal dysphagia) Difficulty swallowing large pills, not food.   - Continue management with smaller pills if possible. Evaluate further with EGD.   Constipation due to opioid use Constipation managed with laxatives and stool softeners. Daily bowel movements.   - Continue laxatives and stool softeners as needed.   Atrial flutter, status post ablation, with pacemaker Atrial flutter managed with pacemaker and ablation. On Eliquis  for anticoagulation.   - Coordinate with cardiology to hold Eliquis  prior to procedure.   History of appendicitis, non-operatively managed Appendicitis managed non-operatively. Occasional right lower quadrant twinges.   History of hypotensive episode (low blood pressure) Recent hypotensive episode. On metoprolol , may contribute to low blood pressure.   - Monitor blood pressure regularly.   Impaired mobility, ambulatory with assistance, uses wheelchair Impaired mobility due to multiple factors. Walks short distances with assistance.   Gastroesophageal reflux disease, controlled with medication GERD controlled with Pepcid .   - Continue Pepcid  as prescribed.     Camie Furbish, PA-C [facility] 08/16/2024, 1:43 PM        A patient of Dr. Shila.  Recent GI office visit as above.  She was set up for upper endoscopy yesterday to rule out varices and evaluate vague pill dysphagia.  That examination was canceled as the patient did not follow the n.p.o. instructions properly.  Rescheduled with myself.

## 2024-09-20 NOTE — Op Note (Signed)
 [facility] Patient Name: [name] Procedure Date: 09/20/2024 10:54 AM MRN: [id number] Endoscopist: Norleen SAILOR. Abran , MD, [id number] Age: 63 Referring MD:  Date of Birth: 03-09-1961 Gender: Female Account #: [id number] Procedure:                Upper GI endoscopy Indications:              Dysphagia (vague pill), Esophageal reflux,                            cirrhosisevaluate for varices Medicines:                Monitored Anesthesia Care Procedure:                Pre-Anesthesia Assessment:                           - Prior to the procedure, a History and Physical                            was performed, and patient medications and                            allergies were reviewed. The patient's tolerance of                            previous anesthesia was also reviewed. The risks                            and benefits of the procedure and the sedation                            options and risks were discussed with the patient.                            All questions were answered, and informed consent                            was obtained. Prior Anticoagulants: The patient has                            taken Eliquis  (apixaban ), last dose was 4 days                            prior to procedure. ASA Grade Assessment: III - A                            patient with severe systemic disease. After                            reviewing the risks and benefits, the patient was                            deemed in satisfactory condition to undergo the  procedure.                           After obtaining informed consent, the endoscope was                            passed under direct vision. Throughout the                            procedure, the patient's blood pressure, pulse, and                            oxygen  saturations were monitored continuously. The                            Olympus Scope SN [id number] was introduced through the                             mouth, and advanced to the second part of duodenum.                            The upper GI endoscopy was accomplished without                            difficulty. The patient tolerated the procedure                            well. Scope In: Scope Out: Findings:                 The esophagus revealed trace esophageal varices. No                            other significant abnormalities.                           The stomach revealed moderate portal hypertensive                            gastropathy throughout. Some hematin. No proximal                            gastric varices.                           The examined duodenum was normal.                           The cardia and gastric fundus were normal on                            retroflexion. Complications:            No immediate complications. Estimated Blood Loss:     Estimated blood loss: none. Impression:               1. Trace esophageal varices. Otherwise, normal  esophagus                           2. Moderate portal hypertensive gastropathy                           3. Otherwise unremarkable exam. Recommendation:           - Patient has a contact number available for                            emergencies. The signs and symptoms of potential                            delayed complications were discussed with the                            patient. Return to normal activities tomorrow.                            Written discharge instructions were provided to the                            patient.                           - Resume previous diet.                           - Continue present medications.                           - Resume Eliquis  TODAY                           - Follow-up with Dr. Nandigam in 2 months Norleen SAILOR. Abran, MD 09/20/2024 11:24:26 AM This report has been signed electronically.

## 2024-09-21 ENCOUNTER — Telehealth: Payer: Self-pay

## 2024-09-21 NOTE — Telephone Encounter (Signed)
 Left message on follow up call.

## 2024-10-12 ENCOUNTER — Other Ambulatory Visit: Payer: Self-pay | Admitting: Nurse Practitioner

## 2024-10-12 DIAGNOSIS — K7030 Alcoholic cirrhosis of liver without ascites: Secondary | ICD-10-CM

## 2024-10-27 ENCOUNTER — Ambulatory Visit
Admission: RE | Admit: 2024-10-27 | Discharge: 2024-10-27 | Disposition: A | Discharge status: Home / Self Care | Source: Ambulatory Visit | Attending: Nurse Practitioner | Admitting: Nurse Practitioner

## 2024-10-27 DIAGNOSIS — K7030 Alcoholic cirrhosis of liver without ascites: Secondary | ICD-10-CM

## 2024-11-07 ENCOUNTER — Ambulatory Visit: Payer: BC Managed Care – PPO

## 2024-11-08 LAB — CUP PACEART REMOTE DEVICE CHECK
Battery Remaining Longevity: 54 mo
Battery Remaining Percentage: 44 %
Battery Voltage: 2.98 V
Brady Statistic AP VP Percent: 1 %
Brady Statistic AP VS Percent: 85 %
Brady Statistic AS VP Percent: 1 %
Brady Statistic AS VS Percent: 14 %
Brady Statistic RA Percent Paced: 86 %
Brady Statistic RV Percent Paced: 1 %
Date Time Interrogation Session: 20260202020018
Implantable Lead Connection Status: 753985
Implantable Lead Connection Status: 753985
Implantable Lead Implant Date: 20191031
Implantable Lead Implant Date: 20191031
Implantable Lead Location: 753859
Implantable Lead Location: 753860
Implantable Pulse Generator Implant Date: 20191031
Lead Channel Impedance Value: 460 Ohm
Lead Channel Impedance Value: 480 Ohm
Lead Channel Pacing Threshold Amplitude: 0.5 V
Lead Channel Pacing Threshold Amplitude: 0.75 V
Lead Channel Pacing Threshold Pulse Width: 0.5 ms
Lead Channel Pacing Threshold Pulse Width: 0.5 ms
Lead Channel Sensing Intrinsic Amplitude: 2.6 mV
Lead Channel Sensing Intrinsic Amplitude: 8.2 mV
Lead Channel Setting Pacing Amplitude: 1 V
Lead Channel Setting Pacing Amplitude: 1.5 V
Lead Channel Setting Pacing Pulse Width: 0.5 ms
Lead Channel Setting Sensing Sensitivity: 2 mV
Pulse Gen Model: 2272
Pulse Gen Serial Number: 9079034

## 2024-11-18 ENCOUNTER — Ambulatory Visit: Admitting: Gastroenterology

## 2024-12-06 ENCOUNTER — Ambulatory Visit (HOSPITAL_BASED_OUTPATIENT_CLINIC_OR_DEPARTMENT_OTHER): Admitting: Cardiology

## 2025-02-06 ENCOUNTER — Ambulatory Visit: Payer: BC Managed Care – PPO

## 2025-02-09 ENCOUNTER — Ambulatory Visit: Admitting: Neurology
